# Patient Record
Sex: Female | Born: 1970 | Race: Black or African American | Hispanic: No | Marital: Single | Smoking: Current every day smoker
Health system: Southern US, Community
[De-identification: ages and names within clinical notes are randomized; demographics above are authoritative.]

## PROBLEM LIST (undated history)

## (undated) ENCOUNTER — Emergency Department (HOSPITAL_COMMUNITY): Payer: Self-pay | Source: Home / Self Care

## (undated) ENCOUNTER — Emergency Department (HOSPITAL_COMMUNITY): Payer: Medicaid Other

## (undated) ENCOUNTER — Emergency Department (HOSPITAL_COMMUNITY): Admission: EM | Payer: Medicaid Other | Source: Home / Self Care

## (undated) DIAGNOSIS — I517 Cardiomegaly: Secondary | ICD-10-CM

## (undated) DIAGNOSIS — R002 Palpitations: Secondary | ICD-10-CM

## (undated) DIAGNOSIS — F319 Bipolar disorder, unspecified: Secondary | ICD-10-CM

## (undated) DIAGNOSIS — F1910 Other psychoactive substance abuse, uncomplicated: Secondary | ICD-10-CM

## (undated) DIAGNOSIS — F32A Depression, unspecified: Secondary | ICD-10-CM

## (undated) DIAGNOSIS — D573 Sickle-cell trait: Secondary | ICD-10-CM

## (undated) DIAGNOSIS — F29 Unspecified psychosis not due to a substance or known physiological condition: Secondary | ICD-10-CM

## (undated) DIAGNOSIS — Z5900 Homelessness unspecified: Secondary | ICD-10-CM

## (undated) DIAGNOSIS — F22 Delusional disorders: Secondary | ICD-10-CM

## (undated) DIAGNOSIS — R011 Cardiac murmur, unspecified: Secondary | ICD-10-CM

## (undated) HISTORY — PX: SKIN BIOPSY: SHX1

## (undated) HISTORY — PX: BUNIONECTOMY: SHX129

## (undated) HISTORY — DX: Cardiac murmur, unspecified: R01.1

## (undated) HISTORY — DX: Other psychoactive substance abuse, uncomplicated: F19.10

## (undated) HISTORY — DX: Depression, unspecified: F32.A

---

## 2001-12-30 ENCOUNTER — Encounter: Payer: Self-pay | Admitting: Emergency Medicine

## 2001-12-30 ENCOUNTER — Emergency Department (HOSPITAL_COMMUNITY): Admission: EM | Admit: 2001-12-30 | Discharge: 2001-12-30 | Payer: Self-pay

## 2003-12-12 ENCOUNTER — Emergency Department (HOSPITAL_COMMUNITY): Admission: EM | Admit: 2003-12-12 | Discharge: 2003-12-12 | Payer: Self-pay | Admitting: Emergency Medicine

## 2004-01-08 ENCOUNTER — Emergency Department (HOSPITAL_COMMUNITY): Admission: EM | Admit: 2004-01-08 | Discharge: 2004-01-08 | Payer: Self-pay | Admitting: Emergency Medicine

## 2004-02-17 ENCOUNTER — Encounter: Admission: RE | Admit: 2004-02-17 | Discharge: 2004-02-17 | Payer: Self-pay | Admitting: Family Medicine

## 2004-10-05 ENCOUNTER — Encounter (INDEPENDENT_AMBULATORY_CARE_PROVIDER_SITE_OTHER): Payer: Self-pay | Admitting: *Deleted

## 2004-10-09 ENCOUNTER — Ambulatory Visit: Payer: Self-pay | Admitting: Sports Medicine

## 2004-12-27 ENCOUNTER — Emergency Department (HOSPITAL_COMMUNITY): Admission: EM | Admit: 2004-12-27 | Discharge: 2004-12-27 | Payer: Self-pay | Admitting: Emergency Medicine

## 2005-03-04 ENCOUNTER — Ambulatory Visit: Payer: Self-pay | Admitting: Family Medicine

## 2005-06-10 ENCOUNTER — Emergency Department (HOSPITAL_COMMUNITY): Admission: EM | Admit: 2005-06-10 | Discharge: 2005-06-10 | Payer: Self-pay | Admitting: Emergency Medicine

## 2005-06-18 ENCOUNTER — Ambulatory Visit: Payer: Self-pay | Admitting: Family Medicine

## 2005-06-19 ENCOUNTER — Emergency Department (HOSPITAL_COMMUNITY): Admission: EM | Admit: 2005-06-19 | Discharge: 2005-06-19 | Payer: Self-pay | Admitting: Emergency Medicine

## 2005-11-12 ENCOUNTER — Ambulatory Visit: Payer: Self-pay | Admitting: Sports Medicine

## 2006-04-08 ENCOUNTER — Ambulatory Visit: Payer: Self-pay | Admitting: Family Medicine

## 2006-04-08 ENCOUNTER — Other Ambulatory Visit: Admission: RE | Admit: 2006-04-08 | Discharge: 2006-04-08 | Payer: Self-pay | Admitting: Family Medicine

## 2006-04-21 ENCOUNTER — Ambulatory Visit: Payer: Self-pay | Admitting: Family Medicine

## 2006-05-05 ENCOUNTER — Encounter: Admission: RE | Admit: 2006-05-05 | Discharge: 2006-05-05 | Payer: Self-pay | Admitting: Sports Medicine

## 2006-12-29 DIAGNOSIS — M545 Low back pain, unspecified: Secondary | ICD-10-CM | POA: Insufficient documentation

## 2006-12-29 DIAGNOSIS — F319 Bipolar disorder, unspecified: Secondary | ICD-10-CM | POA: Insufficient documentation

## 2006-12-29 HISTORY — DX: Low back pain, unspecified: M54.50

## 2006-12-30 ENCOUNTER — Encounter (INDEPENDENT_AMBULATORY_CARE_PROVIDER_SITE_OTHER): Payer: Self-pay | Admitting: *Deleted

## 2007-07-15 ENCOUNTER — Emergency Department (HOSPITAL_COMMUNITY): Admission: EM | Admit: 2007-07-15 | Discharge: 2007-07-15 | Payer: Self-pay | Admitting: Emergency Medicine

## 2007-08-16 ENCOUNTER — Encounter (INDEPENDENT_AMBULATORY_CARE_PROVIDER_SITE_OTHER): Payer: Self-pay | Admitting: *Deleted

## 2007-12-07 ENCOUNTER — Emergency Department (HOSPITAL_COMMUNITY): Admission: EM | Admit: 2007-12-07 | Discharge: 2007-12-08 | Payer: Self-pay | Admitting: Emergency Medicine

## 2008-01-08 ENCOUNTER — Emergency Department (HOSPITAL_COMMUNITY): Admission: EM | Admit: 2008-01-08 | Discharge: 2008-01-08 | Payer: Self-pay | Admitting: Emergency Medicine

## 2011-07-23 LAB — URINE MICROSCOPIC-ADD ON

## 2011-07-23 LAB — URINE CULTURE: Colony Count: 85000

## 2011-07-23 LAB — URINALYSIS, ROUTINE W REFLEX MICROSCOPIC
Hgb urine dipstick: NEGATIVE
Specific Gravity, Urine: 1.012
Urobilinogen, UA: 0.2

## 2011-07-23 LAB — POCT PREGNANCY, URINE: Preg Test, Ur: NEGATIVE

## 2011-07-26 LAB — COMPREHENSIVE METABOLIC PANEL
Albumin: 4.5
Alkaline Phosphatase: 48
BUN: 13
Creatinine, Ser: 0.87
Glucose, Bld: 86
Potassium: 3.5
Total Bilirubin: 1.3 — ABNORMAL HIGH
Total Protein: 6.9

## 2011-07-26 LAB — CBC
HCT: 36
Hemoglobin: 12.4
MCV: 86.8
Platelets: 221
RDW: 13.7

## 2011-07-26 LAB — DIFFERENTIAL
Basophils Absolute: 0.1
Basophils Relative: 1
Lymphocytes Relative: 22
Monocytes Absolute: 0.7
Monocytes Relative: 12
Neutro Abs: 3.6
Neutrophils Relative %: 64

## 2011-07-26 LAB — URINALYSIS, ROUTINE W REFLEX MICROSCOPIC
Bilirubin Urine: NEGATIVE
Glucose, UA: NEGATIVE
Hgb urine dipstick: NEGATIVE
Ketones, ur: 40 — AB
Protein, ur: 30 — AB
Urobilinogen, UA: 1

## 2011-07-26 LAB — RAPID URINE DRUG SCREEN, HOSP PERFORMED
Amphetamines: NOT DETECTED
Barbiturates: NOT DETECTED
Benzodiazepines: NOT DETECTED
Cocaine: NOT DETECTED
Opiates: NOT DETECTED

## 2011-07-26 LAB — URINE MICROSCOPIC-ADD ON

## 2011-08-13 LAB — POCT URINALYSIS DIP (DEVICE)
Glucose, UA: NEGATIVE
Operator id: 239701
Protein, ur: NEGATIVE
Urobilinogen, UA: 1

## 2011-08-13 LAB — POCT PREGNANCY, URINE: Operator id: 239701

## 2011-08-13 LAB — GC/CHLAMYDIA PROBE AMP, GENITAL: GC Probe Amp, Genital: NEGATIVE

## 2011-08-13 LAB — OCCULT BLOOD X 1 CARD TO LAB, STOOL: Fecal Occult Bld: NEGATIVE

## 2011-10-07 ENCOUNTER — Emergency Department (HOSPITAL_COMMUNITY)
Admission: EM | Admit: 2011-10-07 | Discharge: 2011-10-07 | Disposition: A | Discharge status: Home / Self Care | Payer: Medicaid Other | Attending: Emergency Medicine | Admitting: Emergency Medicine

## 2011-10-07 ENCOUNTER — Other Ambulatory Visit: Payer: Self-pay

## 2011-10-07 ENCOUNTER — Encounter: Payer: Self-pay | Admitting: Emergency Medicine

## 2011-10-07 ENCOUNTER — Emergency Department (HOSPITAL_COMMUNITY): Payer: Medicaid Other

## 2011-10-07 DIAGNOSIS — I498 Other specified cardiac arrhythmias: Secondary | ICD-10-CM | POA: Insufficient documentation

## 2011-10-07 DIAGNOSIS — I5022 Chronic systolic (congestive) heart failure: Secondary | ICD-10-CM | POA: Insufficient documentation

## 2011-10-07 DIAGNOSIS — R079 Chest pain, unspecified: Secondary | ICD-10-CM | POA: Insufficient documentation

## 2011-10-07 DIAGNOSIS — I509 Heart failure, unspecified: Secondary | ICD-10-CM | POA: Insufficient documentation

## 2011-10-07 HISTORY — DX: Unspecified psychosis not due to a substance or known physiological condition: F29

## 2011-10-07 LAB — BASIC METABOLIC PANEL
BUN: 12 mg/dL (ref 6–23)
CO2: 27 mEq/L (ref 19–32)
Chloride: 102 mEq/L (ref 96–112)
Glucose, Bld: 88 mg/dL (ref 70–99)
Potassium: 4.5 mEq/L (ref 3.5–5.1)
Sodium: 138 mEq/L (ref 135–145)

## 2011-10-07 LAB — CBC
HCT: 38.8 % (ref 36.0–46.0)
Hemoglobin: 13.3 g/dL (ref 12.0–15.0)
MCH: 28.5 pg (ref 26.0–34.0)
MCHC: 34.3 g/dL (ref 30.0–36.0)
MCV: 83.3 fL (ref 78.0–100.0)
Platelets: 238 K/uL (ref 150–400)
RBC: 4.66 MIL/uL (ref 3.87–5.11)
RDW: 13.5 % (ref 11.5–15.5)
WBC: 5.8 K/uL (ref 4.0–10.5)

## 2011-10-07 LAB — BASIC METABOLIC PANEL WITH GFR
Calcium: 9.3 mg/dL (ref 8.4–10.5)
Creatinine, Ser: 0.75 mg/dL (ref 0.50–1.10)
GFR calc Af Amer: >90 mL/min (ref >90)
GFR calc non Af Amer: >90 mL/min (ref >90)

## 2011-10-07 LAB — POCT I-STAT TROPONIN I: Troponin i, poc: 0 ng/mL (ref 0.00–0.08)

## 2011-10-07 NOTE — ED Notes (Signed)
Pt c/o left sided CP with SOB today; pt sts bilateral LE edema; pt sts hx of same with CHF but not followed up with PCP; pt denies N/V or diaphoresis

## 2011-10-07 NOTE — ED Notes (Signed)
Pt is extremely anxious and crying about her condition.  Asked if this pain in her chest increases with anxiety.  Pt states it doesn't increase with anxiety, but decreases when she smokes because she is focused on smoking not on the pain.  The shortness of breath also decreases for patient when she smokes patient states.

## 2011-10-07 NOTE — ED Notes (Signed)
Patient intermittent chest pain for a while seen in another ED one year ago and chest pain comes and goes for approx one hour. States smoking helps with the pain.  Pain currently 0/10 when pain appears location under left breast.  Airway intact bilateral equal chest rise and fall,

## 2011-10-07 NOTE — ED Provider Notes (Signed)
History     CSN: [id number] Arrival date & time: 10/07/2011  2:32 PM   First MD Initiated Contact with Patient 10/07/11 1550      Chief Complaint  Patient presents with  . Chest Pain    (Consider location/radiation/quality/duration/timing/severity/associated sxs/prior treatment) HPI Comments: Patient reports that she's had focal left-sided chest pain or that is nondescriptive and not present currently that has been bothering her for several months. She reports she had about 5 or 6 visits in a short time frame in August of this year while she was at [facility]. She reports that she was told she has congestive heart failure. She denies a history of hypertension, coronary artery disease. She does smoke cigarettes. She is no family history of early coronary disease. She reports that she was on the bus nearby when she had another episode of the chest pain and decided to come here for evaluation. She denies any recent fever, cold symptoms, cough. She denies pleurisy. She denies radiation of the pain. She reports she often has pain like this that lasts about an hour and she'll have it several times per day lasting for a couple of days. She'll then have no symptoms for a prolonged period of time until it starts up again. She denies any trauma. She reports that she decided she would try to find a primary care physician here locally but has not yet done so. She used to see family practice clinic here in [place] prior to transiently moving to [place] for the past year. She came back to [place] only recently. She denies any current stressors.  Patient is a 40 y.o. female presenting with chest pain. The history is provided by the patient.  Chest Pain Pertinent negatives for primary symptoms include no fever, no shortness of breath, no cough and no abdominal pain.     Past Medical History  Diagnosis Date  . Congestive heart failure     History reviewed. No pertinent past surgical  history.  History reviewed. No pertinent family history.  History  Substance Use Topics  . Smoking status: Current Everyday Smoker  . Smokeless tobacco: Not on file  . Alcohol Use: No    OB History    Grav Para Term Preterm Abortions TAB SAB Ect Mult Living                  Review of Systems  Constitutional: Negative for fever and chills.  HENT: Negative for congestion and rhinorrhea.   Respiratory: Negative for cough and shortness of breath.   Cardiovascular: Positive for chest pain.  Gastrointestinal: Negative for abdominal pain.  Musculoskeletal: Negative for back pain.  All other systems reviewed and are negative.    Allergies  Review of patient's allergies indicates no known allergies.  Home Medications   Current Outpatient Rx  Name Route Sig Dispense Refill  . IBUPROFEN 600 MG PO TABS Oral Take 600 mg by mouth every 6 (six) hours as needed. For pain       BP 114/76  Pulse 73  Temp(Src) 98.2 F (36.8 C) (Oral)  Resp 16  SpO2 100%  Physical Exam  Nursing note and vitals reviewed. Constitutional: She appears well-developed and well-nourished. No distress.  Cardiovascular: S1 normal, S2 normal and intact distal pulses.   No extrasystoles are present. Bradycardia present.   No murmur heard. Pulmonary/Chest: Effort normal and breath sounds normal. No respiratory distress. She has no wheezes.  Abdominal: Soft.  Psychiatric: Her affect is blunt. She is  not agitated, not slowed and not withdrawn.       Pt's affect is somewhat unusual, seems somewhat argumentative.  Possibly slightly pre-occupied, but makes good eye contact and doesn't seem to be watching or listening to overt hallucinations.   She is attentive.    ED Course  Procedures (including critical care time)   Labs Reviewed  BASIC METABOLIC PANEL  CBC  POCT I-STAT TROPONIN I  I-STAT TROPONIN I   Dg Chest 2 View  10/07/2011  *RADIOLOGY REPORT*  Clinical Data: Chest pain and shortness of breath.   CHEST - 2 VIEW  Comparison: PA and lateral chest 12/27/2004.  Findings: Lungs are clear.  No pneumothorax or pleural effusion. Heart size normal.  No focal bony abnormality.  IMPRESSION: Negative exam.  Original Report Authenticated By: Bernadene Bell. D'ALESSIO, M.D.     1. Chest pain      I reviewed the above chest x-ray myself. MDM    I reviewed spotty PCP and ED notes over the past 6 years.  Pt is thin, in no distress, not sweaty, no active CP.  ECG at time 14:34 shows NSR at 74, normal axis, no ST or T wave changes.  Poor r wave progression V1 through V4 and no prior ECG's to compare to.  Lungs are clear, no JVD.  Given lack of h/o CAD or HTN, I doubt that she has CHF.  Her pain is atypical and will rule out any recent cardiac injury with 1 troponin and otherwise I agree with pt in that she can follow up with a PCP and I have verbally suggested some clinics around town.  I doubt PE as she has no edema on exam, not pleuritic and sats on RA are 100% which is completely normal.        5:33 PM Although I am unable to see recent EPIC or eChart admissions, I reviewed a discharge summary from 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  This is in line with slightly abn affect and argumentative behavior here.  I do not feel pt is a threat to self at this point, but have recommended that pt seek outpatient help as needed.    Gavin Pound. Debra Colon, MD 10/07/11 9604

## 2011-10-07 NOTE — Discharge Instructions (Signed)
 Your ECG, blood tests and chest xray were all ok.  No evidence of heart damage or congestive heart failure.  I recommend that you obtain a family physician as you were planning for ongoing continued care and return if you have any other concerns or any new symptoms.      Chest Pain (Nonspecific) It is often hard to give a specific diagnosis for the cause of chest pain. There is always a chance that your pain could be related to something serious, such as a heart attack or a blood clot in the lungs. You need to follow up with your caregiver for further evaluation. CAUSES   Heartburn.   Pneumonia or bronchitis.   Anxiety and stress.   Inflammation around your heart (pericarditis) or lung (pleuritis or pleurisy).   A blood clot in the lung.   A collapsed lung (pneumothorax). It can develop suddenly on its own (spontaneous pneumothorax) or from injury (trauma) to the chest.  The chest wall is composed of bones, muscles, and cartilage. Any of these can be the source of the pain.  The bones can be bruised by injury.   The muscles or cartilage can be strained by coughing or overwork.   The cartilage can be affected by inflammation and become sore (costochondritis).  DIAGNOSIS  Lab tests or other studies, such as X-rays, an EKG, stress testing, or cardiac imaging, may be needed to find the cause of your pain.  TREATMENT   Treatment depends on what may be causing your chest pain. Treatment may include:   Acid blockers for heartburn.   Anti-inflammatory medicine.   Pain medicine for inflammatory conditions.   Antibiotics if an infection is present.   You may be advised to change lifestyle habits. This includes stopping smoking and avoiding caffeine and chocolate.   You may be advised to keep your head raised (elevated) when sleeping. This reduces the chance of acid going backward from your stomach into your esophagus.   Most of the time, nonspecific chest pain will improve within 2  to 3 days with rest and mild pain medicine.  HOME CARE INSTRUCTIONS   If antibiotics were prescribed, take the full amount even if you start to feel better.   For the next few days, avoid physical activities that bring on chest pain. Continue physical activities as directed.   Do not smoke cigarettes or drink alcohol until your symptoms are gone.   Only take over-the-counter or prescription medicine for pain, discomfort, or fever as directed by your caregiver.   Follow your caregiver's suggestions for further testing if your chest pain does not go away.   Keep any follow-up appointments you made. If you do not go to an appointment, you could develop lasting (chronic) problems with pain. If there is any problem keeping an appointment, you must call to reschedule.  SEEK MEDICAL CARE IF:   You think you are having problems from the medicine you are taking. Read your medicine instructions carefully.   Your chest pain does not go away, even after treatment.   You develop a rash with blisters on your chest.  SEEK IMMEDIATE MEDICAL CARE IF:   You have increased chest pain or pain that spreads to your arm, neck, jaw, back, or belly (abdomen).   You develop shortness of breath, an increasing cough, or you are coughing up blood.   You have severe back or abdominal pain, feel sick to your stomach (nauseous) or throw up (vomit).   You develop severe  weakness, fainting, or chills.   You have an oral temperature above 102 F (38.9 C), not controlled by medicine.  THIS IS AN EMERGENCY. Do not wait to see if the pain will go away. Get medical help at once. Call your local emergency services (911 in [place] Do not drive yourself to the hospital. MAKE SURE YOU:   Understand these instructions.   Will watch your condition.   Will get help right away if you are not doing well or get worse.  Document Released: 07/28/2005 Document Revised: 06/30/2011 Document Reviewed: 05/23/2008 [facility] Patient  Information 2012 [place], [place].

## 2012-04-06 LAB — PULMONARY FUNCTION TEST

## 2012-06-01 ENCOUNTER — Emergency Department (HOSPITAL_COMMUNITY): Payer: Medicaid Other

## 2012-06-01 ENCOUNTER — Encounter (HOSPITAL_COMMUNITY): Payer: Self-pay | Admitting: Emergency Medicine

## 2012-06-01 ENCOUNTER — Emergency Department (HOSPITAL_COMMUNITY)
Admission: EM | Admit: 2012-06-01 | Discharge: 2012-06-01 | Disposition: A | Discharge status: Home / Self Care | Payer: Medicaid Other | Source: Home / Self Care | Attending: Emergency Medicine | Admitting: Emergency Medicine

## 2012-06-01 ENCOUNTER — Emergency Department (HOSPITAL_COMMUNITY)
Admission: EM | Admit: 2012-06-01 | Discharge: 2012-06-01 | Disposition: A | Discharge status: Home / Self Care | Payer: Medicaid Other | Attending: Emergency Medicine | Admitting: Emergency Medicine

## 2012-06-01 ENCOUNTER — Encounter (HOSPITAL_COMMUNITY): Payer: Self-pay | Admitting: *Deleted

## 2012-06-01 DIAGNOSIS — R0602 Shortness of breath: Secondary | ICD-10-CM | POA: Insufficient documentation

## 2012-06-01 DIAGNOSIS — R002 Palpitations: Secondary | ICD-10-CM | POA: Insufficient documentation

## 2012-06-01 DIAGNOSIS — F17200 Nicotine dependence, unspecified, uncomplicated: Secondary | ICD-10-CM | POA: Insufficient documentation

## 2012-06-01 HISTORY — DX: Palpitations: R00.2

## 2012-06-01 LAB — COMPREHENSIVE METABOLIC PANEL
ALT: 11 U/L (ref 0–35)
AST: 18 U/L (ref 0–37)
CO2: 25 mEq/L (ref 19–32)
Calcium: 9.5 mg/dL (ref 8.4–10.5)
Chloride: 100 mEq/L (ref 96–112)
GFR calc non Af Amer: >90 mL/min (ref >90)
Sodium: 138 mEq/L (ref 135–145)

## 2012-06-01 LAB — CBC
MCH: 28.2 pg (ref 26.0–34.0)
Platelets: 222 10*3/uL (ref 150–400)
RBC: 4.54 MIL/uL (ref 3.87–5.11)
WBC: 4.3 10*3/uL (ref 4.0–10.5)

## 2012-06-01 LAB — POCT I-STAT TROPONIN I: Troponin i, poc: 0 ng/mL (ref 0.00–0.08)

## 2012-06-01 MED ORDER — POTASSIUM CHLORIDE CRYS ER 20 MEQ PO TBCR
40.0000 meq | EXTENDED_RELEASE_TABLET | Freq: Once | ORAL | Status: AC
  Administered 2012-06-01: 40 meq via ORAL
  Filled 2012-06-01: qty 2

## 2012-06-01 NOTE — ED Provider Notes (Signed)
History     CSN: [id number]  Arrival date & time 06/01/12  1030   First MD Initiated Contact with Patient 06/01/12 1104      Chief Complaint  Patient presents with  . Palpitations  . Shortness of Breath    (Consider location/radiation/quality/duration/timing/severity/associated sxs/prior treatment) HPI Comments: Patient seen here for same last night.  Reports ongoing palpitations and shortness of breath.  This has been going on for 2 years and has been worked up by cardiology at [facility] and no cause has been found.  Workup last night was unremarkable with the exception of mildly low k.    Patient is a 41 y.o. female presenting with palpitations. The history is provided by the patient.  Palpitations  This is a recurrent problem. Episode onset: 2 years ago. Episode frequency: intermittently. The problem has not changed since onset.The problem is associated with an unknown factor. Pertinent negatives include no diaphoresis. She has tried nothing for the symptoms.    Past Medical History  Diagnosis Date  . Palpitations   . Psychosis     Past Surgical History  Procedure Date  . Knee surgery     No family history on file.  History  Substance Use Topics  . Smoking status: Current Everyday Smoker -- 5 years    Types: Cigarettes  . Smokeless tobacco: Not on file  . Alcohol Use: No    OB History    Grav Para Term Preterm Abortions TAB SAB Ect Mult Living                  Review of Systems  Constitutional: Negative for diaphoresis.  Cardiovascular: Positive for palpitations.  All other systems reviewed and are negative.    Allergies  Strawberry and Risperidone and related  Home Medications   Current Outpatient Rx  Name Route Sig Dispense Refill  . AMOXICILLIN 500 MG PO CAPS Oral Take 2,000 mg by mouth once. Dental premed      BP 107/77  Pulse 81  Temp 98.6 F (37 C) (Oral)  Resp 18  SpO2 98%  LMP 05/01/2012  Physical Exam  Nursing note and vitals  reviewed. Constitutional: She is oriented to person, place, and time. She appears well-developed and well-nourished. No distress.  HENT:  Head: Normocephalic and atraumatic.  Neck: Normal range of motion. Neck supple.  Cardiovascular: Normal rate and regular rhythm.  Exam reveals no gallop and no friction rub.   No murmur heard. Pulmonary/Chest: Effort normal and breath sounds normal. No respiratory distress. She has no wheezes.  Abdominal: Soft. Bowel sounds are normal. She exhibits no distension. There is no tenderness.  Musculoskeletal: Normal range of motion.  Neurological: She is alert and oriented to person, place, and time.  Skin: Skin is warm and dry. She is not diaphoretic.    ED Course  Procedures (including critical care time)  Labs Reviewed - No data to display Dg Chest 2 View  06/01/2012  *RADIOLOGY REPORT*  Clinical Data: Shortness of breath  CHEST - 2 VIEW  Comparison: 10/07/2011  Findings: Lungs are clear. No pleural effusion or pneumothorax. Convex left atrial appendage without overt cardiomegaly.  The cardiomediastinal contours are otherwise within normal limits. The visualized bones and soft tissues are without significant appreciable abnormality.  IMPRESSION: Convex left atrial appendage may be within upper normal limits or mildly enlarged.  This finding can be seen with mitral valve dysfunction. If clinically warranted, consider echocardiogram follow-up.  Clear lungs.  Original Report Authenticated By: Greig Castilla  [facility], M.D.     No diagnosis found.   Date: 06/01/2012  Rate: 57  Rhythm: normal sinus rhythm  QRS Axis: normal  Intervals: normal  ST/T Wave abnormalities: normal  Conduction Disutrbances:none  Narrative Interpretation:   Old EKG Reviewed: unchanged    MDM  The patient was here last night for the same symptoms that have been ongoing for the past 2 years.  She has been worked up by cardiology for this but no cause has been found.  She appears well,  is in a normal sinus rhythm, and labs 12 hours ago were completely normal with the exception of the potassium of 3.1.  I agree with Dr. Jeraldine Loots that no further workup is indicated and she is stable for discharge.  There does not appear to be any emergent process.  She will be given the number for [facility] to arrange follow up and possible Holter monitoring.        Geoffery Lyons, MD 06/01/12 1144

## 2012-06-01 NOTE — ED Notes (Signed)
Pt discharged home. Had no further questions at the time. Encouraged to follow up with Cardiologist.

## 2012-06-01 NOTE — ED Notes (Signed)
Pt presents to department for evaluation of palpitations and SOB. Ongoing since yesterday morning. Pt states previous history of same. States "I can feel my heart jumping around." denies actual pain. Skin warm and dry. Lung sounds clear and equal bilaterally. She is conscious alert and oriented x4.

## 2012-06-01 NOTE — ED Provider Notes (Signed)
History     CSN: [id number]  Arrival date & time 06/01/12  0130   First MD Initiated Contact with Patient 06/01/12 [phone]      Chief Complaint  Patient presents with  . Shortness of Breath    (Consider location/radiation/quality/duration/timing/severity/associated sxs/prior treatment) HPI This patient with history of palpitations presents with concerns of ongoing palpitations and intermittent chest tightness.  She denies nausea, but does state that she is occasionally dizzy.  She notes that she has mild intermittent lower extremity edema, unchanged.  She notes that over the last year she has been evaluated by her physician with multiple exams, and that no identifiable source of her palpitations has been identified. There are no clear exacerbating or alleviating factors.  She denies any ongoing headache, confusion, disorientation, significant dyspnea. Past Medical History  Diagnosis Date  . Palpitations   . Psychosis     History reviewed. No pertinent past surgical history.  No family history on file.  History  Substance Use Topics  . Smoking status: Current Everyday Smoker -- 5 years    Types: Cigarettes  . Smokeless tobacco: Not on file  . Alcohol Use: No    OB History    Grav Para Term Preterm Abortions TAB SAB Ect Mult Living                  Review of Systems  Constitutional:       HPI  HENT:       HPI otherwise negative  Eyes: Negative.   Respiratory:       HPI, otherwise negative  Cardiovascular:       HPI, otherwise nmegative  Gastrointestinal: Negative for vomiting.  Genitourinary:       HPI, otherwise negative  Musculoskeletal:       HPI, otherwise negative  Skin: Negative.   Neurological: Negative for syncope.    Allergies  Strawberry and Risperidone and related  Home Medications   Current Outpatient Rx  Name Route Sig Dispense Refill  . AMOXICILLIN 500 MG PO CAPS Oral Take 2,000 mg by mouth once. Dental premed    . IBUPROFEN 600 MG PO TABS  Oral Take 600 mg by mouth every 6 (six) hours as needed. For pain       BP 100/70  Pulse 50  Temp 97.7 F (36.5 C) (Oral)  Resp 18  Ht 5\' 5"  (1.651 m)  Wt 130 lb (58.968 kg)  BMI 21.63 kg/m2  SpO2 100%  Physical Exam  Nursing note and vitals reviewed. Constitutional: She is oriented to person, place, and time. She appears well-developed and well-nourished. No distress.  HENT:  Head: Normocephalic and atraumatic.  Eyes: Conjunctivae and EOM are normal.  Cardiovascular: Normal rate and regular rhythm.   Pulmonary/Chest: Effort normal and breath sounds normal. No stridor. No respiratory distress.  Abdominal: She exhibits no distension.  Musculoskeletal: She exhibits no edema.  Neurological: She is alert and oriented to person, place, and time. No cranial nerve deficit.  Skin: Skin is warm and dry.  Psychiatric: She has a normal mood and affect.    ED Course  Procedures (including critical care time)  Labs Reviewed  COMPREHENSIVE METABOLIC PANEL - Abnormal; Notable for the following:    Potassium 3.2 (*)     All other components within normal limits  CBC  PRO B NATRIURETIC PEPTIDE  POCT PREGNANCY, URINE  POCT I-STAT TROPONIN I   Dg Chest 2 View  06/01/2012  *RADIOLOGY REPORT*  Clinical Data: Shortness of breath  CHEST - 2 VIEW  Comparison: 10/07/2011  Findings: Lungs are clear. No pleural effusion or pneumothorax. Convex left atrial appendage without overt cardiomegaly.  The cardiomediastinal contours are otherwise within normal limits. The visualized bones and soft tissues are without significant appreciable abnormality.  IMPRESSION: Convex left atrial appendage may be within upper normal limits or mildly enlarged.  This finding can be seen with mitral valve dysfunction. If clinically warranted, consider echocardiogram follow-up.  Clear lungs.  Original Report Authenticated By: Waneta Martins, M.D.     1. Palpitations      Date: 06/01/2012  Rate: 86   Rhythm: normal  sinus rhythm and sinus arrhythmia  QRS Axis: normal  Intervals: normal  ST/T Wave abnormalities: normal  Conduction Disutrbances:none  Narrative Interpretation:   Old EKG Reviewed: none available BORDERLINE      MDM  This generally well-appearing female presents with ongoing palpitations, intermittent chest tightness.  The patient has no significant risk factors for PE, nor for CV pathology.  On exam the patient is in no distress, not hypoxic, tachypneic or tachycardic.  The patient's ECG is nonischemic.  The patient has close followup potential and was discharged in stable condition to follow up with her primary care physician.    Gerhard Munch, MD 06/01/12 [phone]

## 2012-06-01 NOTE — ED Notes (Addendum)
Pt reports feeling palpitations and shortness of breath onset last night. patient reports history of same but forgot to follow up with PMD. Pt reports recent stress in her life. Pt reports having abnormal period in July, lasting only 3 days.

## 2012-06-01 NOTE — ED Notes (Addendum)
Pt states she has a hx of palpitations with borderline LVH.  She yesterday around 1600 she began feeling chest tightness and increased palpitations.  Pt denies nausea, but she states she feels tired and dizzy. She also c/o bil LE edema yesterday.

## 2012-06-12 ENCOUNTER — Encounter: Payer: Self-pay | Admitting: *Deleted

## 2012-06-12 ENCOUNTER — Emergency Department (HOSPITAL_COMMUNITY)
Admission: EM | Admit: 2012-06-12 | Discharge: 2012-06-12 | Discharge status: Against Medical Advice | Payer: Medicaid Other | Attending: Emergency Medicine | Admitting: Emergency Medicine

## 2012-06-12 ENCOUNTER — Other Ambulatory Visit: Payer: Self-pay

## 2012-06-12 ENCOUNTER — Encounter: Payer: Self-pay | Admitting: Cardiovascular Disease

## 2012-06-12 ENCOUNTER — Emergency Department (HOSPITAL_COMMUNITY): Payer: Medicaid Other

## 2012-06-12 DIAGNOSIS — F29 Unspecified psychosis not due to a substance or known physiological condition: Secondary | ICD-10-CM | POA: Insufficient documentation

## 2012-06-12 DIAGNOSIS — R002 Palpitations: Secondary | ICD-10-CM | POA: Insufficient documentation

## 2012-06-12 DIAGNOSIS — R0602 Shortness of breath: Secondary | ICD-10-CM | POA: Insufficient documentation

## 2012-06-12 DIAGNOSIS — R0789 Other chest pain: Secondary | ICD-10-CM | POA: Insufficient documentation

## 2012-06-12 HISTORY — DX: Bipolar disorder, unspecified: F31.9

## 2012-06-12 NOTE — ED Notes (Signed)
Pt being uncooperative at triage; pt not answering all questions and refused to have EMT/NTech at bedside;

## 2012-06-12 NOTE — ED Notes (Signed)
Pt refusing to have blood work done--Jennaya at bedside speaking with pt re: importance of bloodwork

## 2012-06-12 NOTE — ED Notes (Addendum)
Reports SOB and chest pressure starting about 5pm; pt is diaphoretic at triage, but reports she had to walk here; has seen PCP re: cp and has cardiology appt tomorrow;

## 2012-06-12 NOTE — ED Notes (Signed)
Pt refused lab draws, and chest x ray. She states she is going to leave because "I know know one is going to do anything for me. They keep telling me nothing is wrong but I know something is wrong and they just won't make a diagnosis" RN explained that pt should be seen by a Dr and that we are here to help her. Pt walked out.

## 2012-06-13 ENCOUNTER — Encounter: Payer: Medicaid Other | Admitting: Cardiovascular Disease

## 2012-07-06 ENCOUNTER — Ambulatory Visit (INDEPENDENT_AMBULATORY_CARE_PROVIDER_SITE_OTHER): Payer: Medicaid Other | Admitting: Cardiology

## 2012-07-06 ENCOUNTER — Encounter: Payer: Self-pay | Admitting: Cardiology

## 2012-07-06 VITALS — BP 107/70 | HR 69 | Ht 65.0 in | Wt 129.0 lb

## 2012-07-06 DIAGNOSIS — R002 Palpitations: Secondary | ICD-10-CM

## 2012-07-06 DIAGNOSIS — F17200 Nicotine dependence, unspecified, uncomplicated: Secondary | ICD-10-CM

## 2012-07-06 DIAGNOSIS — Z720 Tobacco use: Secondary | ICD-10-CM | POA: Insufficient documentation

## 2012-07-06 NOTE — Assessment & Plan Note (Signed)
Etiology unclear. She had an extensive workup in [facility] recently including stress test, echo and monitor. She states she did have palpitations with a monitor in place. We will obtain all records from [facility] to review and make further recommendations based on results.

## 2012-07-06 NOTE — Progress Notes (Signed)
  HPI: 41 year-old female with no prior cardiac history for evaluation of palpitations. Chest x-ray in August 2013 showed clear lungs. Left atrial appendage possibly dilated. Troponin normal on 06/01/2012. Hemoglobin 12.8. Potassium 3.2. Renal function normal. Liver functions normal. Pro BNP 42. Patient apparently has had intermittent palpitations for several years. She was evaluated in [place] at an emergency room there by her report. She also has recently been seen by a cardiologist in [facility]. She apparently had an echocardiogram, stress test and monitor but I do not have those results available. She has had an occasional skipped in the past. More recently she has had a brief flutter. She does not have significant dyspnea on exertion, orthopnea or PND but she occasionally has mild pedal edema. No chest pain or syncope. Her palpitations were more severe on August 1 prompting her evaluation in the emergency room. We are now asked to evaluate.  Current Outpatient Prescriptions  Medication Sig Dispense Refill  . albuterol (PROAIR HFA) 108 (90 BASE) MCG/ACT inhaler Inhale 2 puffs into the lungs every 6 (six) hours as needed.      . zolpidem (AMBIEN) 10 MG tablet Take 10 mg by mouth at bedtime as needed.        Allergies  Allergen Reactions  . Strawberry Anaphylaxis  . Risperidone And Related Other (See Comments)    unknown    Past Medical History  Diagnosis Date  . Palpitations   . Psychosis   . BIPOLAR DISORDER   . Substance abuse     Past Surgical History  Procedure Date  . Bunionectomy     History   Social History  . Marital Status: Single    Spouse Name: N/A    Number of Children: 2  . Years of Education: N/A   Occupational History  .      Unemployed   Social History Main Topics  . Smoking status: Current Everyday Smoker -- 5 years    Types: Cigarettes  . Smokeless tobacco: Not on file  . Alcohol Use: No  . Drug Use: No     Previous cocaine, heroin, marijuana    . Sexually Active: Not on file   Other Topics Concern  . Not on file   Social History Narrative  . No narrative on file    Family History  Problem Relation Age of Onset  . Heart disease Mother     CHF    ROS: back pain but no fevers or chills, productive cough, hemoptysis, dysphasia, odynophagia, melena, hematochezia, dysuria, hematuria, rash, seizure activity, orthopnea, PND, pedal edema, claudication. Remaining systems are negative.  Physical Exam:  Blood pressure 107/70, pulse 69, height 5\' 5"  (1.651 m), weight 129 lb (58.514 kg).  General:  Well developed/well nourished in NAD Skin warm/dry Patient not depressed No peripheral clubbing Back-normal HEENT-normal/normal eyelids Neck supple/normal carotid upstroke bilaterally; no bruits; no JVD; no thyromegaly chest - CTA/ normal expansion CV - RRR/normal S1 and S2; no murmurs, rubs or gallops;  PMI nondisplaced Abdomen -NT/ND, no HSM, no mass, + bowel sounds, no bruit 2+ femoral pulses, no bruits Ext-no edema, chords, 2+ DP Neuro-grossly nonfocal  ECG 06/12/2012-sinus rhythm with no ST changes.

## 2012-07-06 NOTE — Assessment & Plan Note (Signed)
Patient counseled on discontinuing. 

## 2012-07-06 NOTE — Patient Instructions (Addendum)
Your physician recommends that you schedule a follow-up appointment in: 3 MONTHS WITH DR CRENSHAW  

## 2012-08-06 ENCOUNTER — Encounter (HOSPITAL_COMMUNITY): Payer: Self-pay | Admitting: *Deleted

## 2012-08-06 ENCOUNTER — Emergency Department (HOSPITAL_COMMUNITY): Payer: Medicaid Other

## 2012-08-06 ENCOUNTER — Emergency Department (HOSPITAL_COMMUNITY)
Admission: EM | Admit: 2012-08-06 | Discharge: 2012-08-06 | Disposition: A | Discharge status: Home / Self Care | Payer: Medicaid Other | Attending: Emergency Medicine | Admitting: Emergency Medicine

## 2012-08-06 DIAGNOSIS — R0602 Shortness of breath: Secondary | ICD-10-CM | POA: Insufficient documentation

## 2012-08-06 DIAGNOSIS — G890 Central pain syndrome: Secondary | ICD-10-CM | POA: Insufficient documentation

## 2012-08-06 DIAGNOSIS — R002 Palpitations: Secondary | ICD-10-CM | POA: Insufficient documentation

## 2012-08-06 LAB — TROPONIN I
Troponin I: <0.3 ng/mL (ref <0.30)
Troponin I: <0.3 ng/mL (ref <0.30)

## 2012-08-06 LAB — POCT PREGNANCY, URINE: Preg Test, Ur: NEGATIVE

## 2012-08-06 LAB — POCT I-STAT, CHEM 8
BUN: 13 mg/dL (ref 6–23)
Calcium, Ion: 1.19 mmol/L (ref 1.12–1.23)
Chloride: 103 mEq/L (ref 96–112)
Creatinine, Ser: 0.9 mg/dL (ref 0.50–1.10)
Glucose, Bld: 99 mg/dL (ref 70–99)
HCT: 38 % (ref 36.0–46.0)
Hemoglobin: 12.9 g/dL (ref 12.0–15.0)
Potassium: 3.4 mEq/L — ABNORMAL LOW (ref 3.5–5.1)
Sodium: 141 mEq/L (ref 135–145)
TCO2: 24 mmol/L (ref 0–100)

## 2012-08-06 LAB — CBC WITH DIFFERENTIAL/PLATELET
Lymphocytes Relative: 44 % (ref 12–46)
Lymphs Abs: 3 10*3/uL (ref 0.7–4.0)
MCV: 82 fL (ref 78.0–100.0)
Neutro Abs: 3.3 10*3/uL (ref 1.7–7.7)
Neutrophils Relative %: 47 % (ref 43–77)
Platelets: 261 10*3/uL (ref 150–400)
RBC: 4.28 MIL/uL (ref 3.87–5.11)
WBC: 7 10*3/uL (ref 4.0–10.5)

## 2012-08-06 LAB — D-DIMER, QUANTITATIVE: D-Dimer, Quant: 0.58 ug/mL-FEU — ABNORMAL HIGH (ref 0.00–0.48)

## 2012-08-06 LAB — GLUCOSE, CAPILLARY: Glucose-Capillary: 89 mg/dL (ref 70–99)

## 2012-08-06 MED ORDER — IOHEXOL 350 MG/ML SOLN: 100.0000 mL | Freq: Once | INTRAVENOUS | Status: DC | PRN

## 2012-08-06 NOTE — ED Notes (Addendum)
Pt. A.O. X 4. NAD. Vitals stable.Denies pain. Ambulatory. No further questions at this time.   Pt removed IV. Bleeding controled. Cath intact on inspection. NAD.

## 2012-08-06 NOTE — ED Notes (Signed)
Pt A.O. X 3. Mutters under her breath. Words undistinguishable. When confronted, pt denies speaking to anyone and becomes agitated. Vitals stable. NAD.

## 2012-08-06 NOTE — ED Notes (Signed)
Pt was at the hair salon and began feeling head pain.  Then the pain radiated down her L arm and L leg.  After that she began having palpitations.  Pt denies hx of anxiety and states she is having a cardiac work-up at [facility].  Dr Jens Som is seeing her at [facility].

## 2012-08-06 NOTE — ED Notes (Addendum)
Pt states "I am having trouble remembering to breath". Lung sounds clear bilaterally. Respirations even and regular. Vitals stable. NAD. Denies any pain. Skin warm and dry. A.O. X 4.

## 2012-08-06 NOTE — ED Provider Notes (Signed)
History     CSN: [id number]  Arrival date & time 08/06/12  0001   First MD Initiated Contact with Patient 08/06/12 0221      Chief Complaint  Patient presents with  . Leg Pain  . Palpitations    (Consider location/radiation/quality/duration/timing/severity/associated sxs/prior treatment) Patient is a 41 y.o. female presenting with palpitations. The history is provided by the patient.  Palpitations  This is a new problem. The current episode started 6 to 12 hours ago. The problem occurs constantly. The problem has not changed since onset.Associated with: getting her hair done and had pain that shot from left neck to left ant through left body and into left leg. Pertinent negatives include no diaphoresis, no fever, no malaise/fatigue, no numbness, no chest pressure, no near-syncope, no orthopnea, no PND, no headaches, no dizziness, no weakness, no cough and no hemoptysis. Associated symptoms comments: Felt like she forgot to breath.    Past Medical History  Diagnosis Date  . Palpitations   . Psychosis   . BIPOLAR DISORDER   . Substance abuse     Past Surgical History  Procedure Date  . Bunionectomy     Family History  Problem Relation Age of Onset  . Heart disease Mother     CHF    History  Substance Use Topics  . Smoking status: Current Every Day Smoker -- 5 years    Types: Cigarettes  . Smokeless tobacco: Not on file  . Alcohol Use: No    OB History    Grav Para Term Preterm Abortions TAB SAB Ect Mult Living                  Review of Systems  Constitutional: Negative for fever, malaise/fatigue and diaphoresis.  Respiratory: Negative for cough and hemoptysis.   Cardiovascular: Positive for palpitations. Negative for orthopnea, leg swelling, PND and near-syncope.  Gastrointestinal: Negative for abdominal distention.  Neurological: Negative for dizziness, seizures, facial asymmetry, speech difficulty, weakness, light-headedness, numbness and headaches.  All  other systems reviewed and are negative.    Allergies  Strawberry and Risperidone and related  Home Medications   Current Outpatient Rx  Name Route Sig Dispense Refill  . ALBUTEROL SULFATE HFA 108 (90 BASE) MCG/ACT IN AERS Inhalation Inhale 2 puffs into the lungs every 6 (six) hours as needed.      BP 107/65  Pulse 68  Temp 97.8 F (36.6 C) (Oral)  Resp 16  Ht 5\' 5"  (1.651 m)  Wt 129 lb (58.514 kg)  BMI 21.47 kg/m2  SpO2 99%  LMP 08/02/2012  Physical Exam  Constitutional: She is oriented to person, place, and time. She appears well-developed. No distress.  HENT:  Head: Normocephalic and atraumatic.  Mouth/Throat: Oropharynx is clear and moist.  Eyes: Conjunctivae normal are normal. Pupils are equal, round, and reactive to light.  Neck: Normal range of motion. Neck supple.  Cardiovascular: Normal rate and regular rhythm.   Pulmonary/Chest: Effort normal and breath sounds normal. She has no wheezes. She has no rales.  Abdominal: Soft. Bowel sounds are normal. There is no tenderness. There is no rebound and no guarding.  Musculoskeletal: Normal range of motion. She exhibits no edema and no tenderness.  Neurological: She is alert and oriented to person, place, and time. She has normal reflexes. She displays normal reflexes. No cranial nerve deficit. She exhibits normal muscle tone.  Skin: Skin is warm and dry. She is not diaphoretic.  Psychiatric: She has a normal mood and affect.  ED Course  Procedures (including critical care time)  Labs Reviewed  CBC WITH DIFFERENTIAL - Abnormal; Notable for the following:    HCT 35.1 (*)     All other components within normal limits  D-DIMER, QUANTITATIVE - Abnormal; Notable for the following:    D-Dimer, Quant 0.58 (*)     All other components within normal limits  TROPONIN I  TROPONIN I  POCT PREGNANCY, URINE  GLUCOSE, CAPILLARY  POCT I-STAT, CHEM 8   Dg Chest 2 View  08/06/2012  *RADIOLOGY REPORT*  Clinical Data: Chest  pressure, shortness of breath.  CHEST - 2 VIEW  Comparison: 06/01/2012  Findings: Lungs clear.  Heart size and pulmonary vascularity normal.  No effusion.  Visualized bones unremarkable.  IMPRESSION: No acute disease   Original Report Authenticated By: Osa Craver, M.D.      No diagnosis found.    MDM   Date: 08/06/2012  Rate:71  Rhythm: normal sinus rhythm  QRS Axis: normal  Intervals: normal  ST/T Wave abnormalities: normal  Conduction Disutrbances: none  Narrative Interpretation: unremarkable    Greater than 8 hours of pain, continuous pain with one negative ekg and one negative troponin is sufficient to exclude ACS. Follow up with your PMD for ongoing care.          Jasmine Awe, MD 08/06/12 [phone]

## 2012-10-13 ENCOUNTER — Ambulatory Visit (INDEPENDENT_AMBULATORY_CARE_PROVIDER_SITE_OTHER): Payer: Medicaid Other | Admitting: Cardiology

## 2012-10-13 ENCOUNTER — Encounter: Payer: Self-pay | Admitting: Cardiology

## 2012-10-13 VITALS — BP 112/60 | HR 60 | Ht 65.0 in | Wt 134.0 lb

## 2012-10-13 DIAGNOSIS — Z720 Tobacco use: Secondary | ICD-10-CM

## 2012-10-13 DIAGNOSIS — R0609 Other forms of dyspnea: Secondary | ICD-10-CM

## 2012-10-13 DIAGNOSIS — R0600 Dyspnea, unspecified: Secondary | ICD-10-CM | POA: Insufficient documentation

## 2012-10-13 DIAGNOSIS — R002 Palpitations: Secondary | ICD-10-CM

## 2012-10-13 DIAGNOSIS — F17200 Nicotine dependence, unspecified, uncomplicated: Secondary | ICD-10-CM

## 2012-10-13 HISTORY — DX: Dyspnea, unspecified: R06.00

## 2012-10-13 NOTE — Progress Notes (Addendum)
   HPI: 41 year-old female I initially saw in Sept 2013 for evaluation of palpitations. Chest x-ray in August 2013 showed clear lungs. Left atrial appendage possibly dilated. Troponin normal on 06/01/2012. Hemoglobin 12.8. Potassium 3.2. Renal function normal. Liver functions normal. Pro BNP 41. Patient apparently has had intermittent palpitations for several years. She was evaluated in [place] at an emergency room there by her report. Review of outside records from [facility] show a monitor in February of 2013 that showed sinus with occasional PAC. Echocardiogram in February of 2013 showed an ejection fraction of 70%. There was question right ventricular hypertrophy. There was mild mitral and tricuspid regurgitation. The patient had a stress echo in February of 2013. Ejection fraction was felt to be 45-50%. The ECG portion showed no ST changes but images could not be obtained. Note chest CT in October of 2013 showed no pulmonary embolus. Since I last saw her she continues to complain of palpitations. They are described as a brief flutter. They are not sustained. No syncope. Note she states she did have palpitations when she wore her monitor previously. She also has some dyspnea. This is not related to exertion and she states is there most that time. There is no orthopnea, PND or pedal edema.  Current Outpatient Prescriptions  Medication Sig Dispense Refill  . albuterol (PROAIR HFA) 108 (90 BASE) MCG/ACT inhaler Inhale 2 puffs into the lungs every 6 (six) hours as needed.      . zolpidem (AMBIEN) 10 MG tablet Take 10 mg by mouth at bedtime as needed.         Past Medical History  Diagnosis Date  . Palpitations   . Psychosis   . BIPOLAR DISORDER   . Substance abuse     Past Surgical History  Procedure Date  . Bunionectomy     History   Social History  . Marital Status: Single    Spouse Name: N/A    Number of Children: 2  . Years of Education: N/A   Occupational History  .     Unemployed   Social History Main Topics  . Smoking status: Current Every Day Smoker -- 5 years    Types: Cigarettes  . Smokeless tobacco: Not on file  . Alcohol Use: No  . Drug Use: No     Comment: Previous cocaine, heroin, marijuana  . Sexually Active: Not on file   Other Topics Concern  . Not on file   Social History Narrative  . No narrative on file    ROS: no fevers or chills, productive cough, hemoptysis, dysphasia, odynophagia, melena, hematochezia, dysuria, hematuria, rash, seizure activity, orthopnea, PND, pedal edema, claudication. Remaining systems are negative.  Physical Exam: Well-developed well-nourished in no acute distress.  Skin is warm and dry.  HEENT is normal.  Neck is supple.  Chest is clear to auscultation with normal expansion.  Cardiovascular exam is regular rate and rhythm.  Abdominal exam nontender or distended. No masses palpated. Extremities show no edema. neuro grossly intact

## 2012-10-13 NOTE — Assessment & Plan Note (Addendum)
Etiology unclear. Patient complains of dyspnea during interview but did not appear to be in distress. Echocardiogram report from [facility] showed normal LV function. Previous BNP normal. Chest x-ray normal. Chest CT showed no pulmonary embolus. Note patient states that she was told previously she had a hole in her heart in [place]. I cannot appreciate a murmur on examination and her echocardiogram from February of 2013 showed only mild mitral and tricuspid regurgitation. I explained that I would be happy to review the records from [place] when available but she states she cannot obtain these. I have asked her to followup with her primary care physician for further evaluation. I do not find a cardiac cause of her dyspnea.

## 2012-10-13 NOTE — Assessment & Plan Note (Signed)
Patient counseled on discontinuing. 

## 2012-10-13 NOTE — Assessment & Plan Note (Signed)
Patient continues to have palpitations. I have reviewed her monitor in [facility] from February 2013. She has sinus rhythm with rare PAC. She did state she had symptoms with a monitor in place. I cannot find a significant arrhythmia to treat. I have instructed her that if her palpitations worsen it would be good to have an electrocardiogram of her symptoms or to be connected to a monitor. We could also consider a repeat monitor in the future but I think would be unrevealing at this point.

## 2012-10-13 NOTE — Patient Instructions (Addendum)
Your physician recommends that you schedule a follow-up appointment in: AS NEEDED  

## 2012-11-06 ENCOUNTER — Emergency Department (HOSPITAL_COMMUNITY)
Admission: EM | Admit: 2012-11-06 | Discharge: 2012-11-06 | Disposition: A | Discharge status: Home / Self Care | Payer: Medicaid Other | Attending: Emergency Medicine | Admitting: Emergency Medicine

## 2012-11-06 ENCOUNTER — Encounter (HOSPITAL_COMMUNITY): Payer: Self-pay | Admitting: Emergency Medicine

## 2012-11-06 DIAGNOSIS — Z79899 Other long term (current) drug therapy: Secondary | ICD-10-CM | POA: Insufficient documentation

## 2012-11-06 DIAGNOSIS — F1910 Other psychoactive substance abuse, uncomplicated: Secondary | ICD-10-CM | POA: Insufficient documentation

## 2012-11-06 DIAGNOSIS — R002 Palpitations: Secondary | ICD-10-CM | POA: Insufficient documentation

## 2012-11-06 DIAGNOSIS — R0602 Shortness of breath: Secondary | ICD-10-CM | POA: Insufficient documentation

## 2012-11-06 DIAGNOSIS — F29 Unspecified psychosis not due to a substance or known physiological condition: Secondary | ICD-10-CM | POA: Insufficient documentation

## 2012-11-06 DIAGNOSIS — F319 Bipolar disorder, unspecified: Secondary | ICD-10-CM | POA: Insufficient documentation

## 2012-11-06 DIAGNOSIS — E559 Vitamin D deficiency, unspecified: Secondary | ICD-10-CM

## 2012-11-06 DIAGNOSIS — F17200 Nicotine dependence, unspecified, uncomplicated: Secondary | ICD-10-CM | POA: Insufficient documentation

## 2012-11-06 MED ORDER — CALCIUM CARBONATE-VITAMIN D 500-200 MG-UNIT PO TABS: 2.0000 | ORAL_TABLET | Freq: Every day | ORAL | Status: DC

## 2012-11-06 MED ORDER — ALBUTEROL SULFATE HFA 108 (90 BASE) MCG/ACT IN AERS: 2.0000 | INHALATION_SPRAY | RESPIRATORY_TRACT | Status: DC | PRN

## 2012-11-06 MED ORDER — LORAZEPAM 1 MG PO TABS: 1.0000 mg | ORAL_TABLET | Freq: Three times a day (TID) | ORAL | Status: DC | PRN

## 2012-11-06 NOTE — ED Notes (Signed)
Per EMS: Pt was at the doctors office where she had been going several times wanting a prescription for vitamin d.  Has been told several times that this is over the counter and you don't need a prescription.  Then stated that the doctor was "treating her like a low life" and called 911 and told them she had chest pain.  Upon transport, pt denied chest pain, SOB, etc.  And stated "I think I'm just tired."  Pt grabbed EMS guy's hand and was trying to pull his wedding ring off.  Pt has strange behavior.

## 2012-11-06 NOTE — ED Notes (Signed)
Pt states that she has been seeing a doctor and two cardiologists for chest pain/palpitations.  They told her that the only thing that is wrong with her is that her vit d level was low.  Told her he would call in a prescription of it.  When she went to get it, they told her that it wasn't covered and that her doctor had to fill out a form.  She went back to the doctor to get them to fill out a form and they told her that Vit D is not a prescription and that she needed to get it over the counter.  States that they were not taking her chest pain (THAT SHE WAS NOT HAVING AT THE MOMENT) seriously so she told them to call 911.  When they told her there was no emergency and they would not be calling.  So pt called herself.  Pt states "I am not having any chest pain or shortness of breath.  It's just that sometimes I do.  They told me to take vitamins to feel better and now they won't give them to me".

## 2012-11-06 NOTE — ED Provider Notes (Signed)
History    This chart was scribed for Celene Kras, MD by Marlin Canary. The patient was seen in room [facility]TR1/[facility]. Patient's care was started at 1829.  CSN: [id number]  Arrival date & time 11/06/12  4098   First MD Initiated Contact with Patient 11/06/12 1855      Chief Complaint  Patient presents with  . Chest Pain  . Medical Clearance    (Consider location/radiation/quality/duration/timing/severity/associated sxs/prior treatment) The history is provided by the EMS personnel. No language interpreter was used.   Anita Hill is a 42 y.o. female who presents to the Emergency Department complaining of intermittent moderate SOB onset over 1 year ago. Patient reports associated heart palpitations but she is unsure if it is due to anxiety.Pt was evaluated multiple times within last year by her PCP and two cardiologist. She has had stress test, echo, CT angio, multiple blood tests and ECGs. Pt states she was diagnosed with mitral regurgitation and vitamin d deficiency. She was prescribed vitamin D supplements but has not taken the medication. She takes [place] and OTC medication for pain with relief. Pt states today went to her PCP to pick up her Vitamin D prescription, but states they were upset with her because she came and and pt states she started feeling symptoms again, so called 911 from the office room.  Pt was brought here for evaluation. Pt has NO COMPLAINTS at this time, and wants to be discharged.      Past Medical History  Diagnosis Date  . Palpitations   . Psychosis   . BIPOLAR DISORDER   . Substance abuse     Past Surgical History  Procedure Date  . Bunionectomy     Family History  Problem Relation Age of Onset  . Heart disease Mother     CHF    History  Substance Use Topics  . Smoking status: Current Every Day Smoker -- 5 years    Types: Cigarettes  . Smokeless tobacco: Not on file  . Alcohol Use: No    OB History    Grav Para Term Preterm Abortions  TAB SAB Ect Mult Living                  Review of Systems  Respiratory: Positive for shortness of breath. Negative for chest tightness.   Cardiovascular: Positive for palpitations. Negative for chest pain and leg swelling.  All other systems reviewed and are negative.   A complete 10 system review of systems was obtained and all systems are negative except as noted in the HPI and PMH.   Allergies  Strawberry and Risperidone and related  Home Medications   Current Outpatient Rx  Name  Route  Sig  Dispense  Refill  . ZOLPIDEM TARTRATE 10 MG PO TABS   Oral   Take 10 mg by mouth at bedtime as needed. PTSD/Sleep.           BP 112/63  Pulse 63  Temp 98 F (36.7 C) (Oral)  Resp 18  SpO2 100%  LMP 11/02/2012  Physical Exam  Nursing note and vitals reviewed. Constitutional: She is oriented to person, place, and time. She appears well-developed and well-nourished.  HENT:  Head: Normocephalic and atraumatic.  Eyes: Conjunctivae normal are normal.  Neck: Neck supple.  Cardiovascular: Normal rate, regular rhythm, normal heart sounds and intact distal pulses.  Exam reveals no gallop and no friction rub.   No murmur heard. Pulmonary/Chest: Breath sounds normal. No respiratory distress. She has  no wheezes. She has no rales.  Musculoskeletal: She exhibits no edema.  Neurological: She is alert and oriented to person, place, and time.  Skin: Skin is warm and dry.    ED Course  Procedures (including critical care time)  DIAGNOSTIC STUDIES: Oxygen Saturation is 100% on room air, Normal by my interpretation.    COORDINATION OF CARE:  1801-Patient / Family / Caregiver informed of clinical course, understand medical decision-making process, and agree with plan.    Labs Reviewed - No data to display No results found.   1. Shortness of breath   2. Vitamin D deficiency       MDM  Pt with no complaints, pt states she just panicked and called 911 from doctors office.  States she would like pt be discharged. Pt would like something for anxiety, thinks this may be panic attack related. Will provide few ativans. Also vitamin d supplement. Will follow up with PCP. i do not think pt needs any further testing at this time. Vs normal.   Filed Vitals:   11/06/12 1847  BP: 112/63  Pulse: 63  Temp: 98 F (36.7 C)  Resp: 18       I personally performed the services described in this documentation, which was scribed in my presence. The recorded information has been reviewed and is accurate.    Lottie Mussel, PA 11/07/12 [phone]

## 2012-11-06 NOTE — ED Notes (Signed)
Pt discharged home; only complaint of leg pain 10/10

## 2012-11-08 NOTE — ED Provider Notes (Signed)
Medical screening examination/treatment/procedure(s) were performed by non-physician practitioner and as supervising physician I was immediately available for consultation/collaboration.    Celene Kras, MD 11/08/12 [phone]

## 2012-12-31 ENCOUNTER — Encounter (HOSPITAL_COMMUNITY): Payer: Self-pay | Admitting: *Deleted

## 2012-12-31 ENCOUNTER — Emergency Department (HOSPITAL_COMMUNITY)
Admission: EM | Admit: 2012-12-31 | Discharge: 2012-12-31 | Disposition: A | Discharge status: Home / Self Care | Payer: Medicaid Other | Attending: Emergency Medicine | Admitting: Emergency Medicine

## 2012-12-31 DIAGNOSIS — Z23 Encounter for immunization: Secondary | ICD-10-CM | POA: Insufficient documentation

## 2012-12-31 DIAGNOSIS — Y9301 Activity, walking, marching and hiking: Secondary | ICD-10-CM | POA: Insufficient documentation

## 2012-12-31 DIAGNOSIS — IMO0002 Reserved for concepts with insufficient information to code with codable children: Secondary | ICD-10-CM | POA: Insufficient documentation

## 2012-12-31 DIAGNOSIS — S0180XA Unspecified open wound of other part of head, initial encounter: Secondary | ICD-10-CM | POA: Insufficient documentation

## 2012-12-31 DIAGNOSIS — Y929 Unspecified place or not applicable: Secondary | ICD-10-CM | POA: Insufficient documentation

## 2012-12-31 DIAGNOSIS — F17200 Nicotine dependence, unspecified, uncomplicated: Secondary | ICD-10-CM | POA: Insufficient documentation

## 2012-12-31 DIAGNOSIS — Z791 Long term (current) use of non-steroidal anti-inflammatories (NSAID): Secondary | ICD-10-CM | POA: Insufficient documentation

## 2012-12-31 DIAGNOSIS — Z8659 Personal history of other mental and behavioral disorders: Secondary | ICD-10-CM | POA: Insufficient documentation

## 2012-12-31 DIAGNOSIS — F1910 Other psychoactive substance abuse, uncomplicated: Secondary | ICD-10-CM | POA: Insufficient documentation

## 2012-12-31 DIAGNOSIS — M2550 Pain in unspecified joint: Secondary | ICD-10-CM | POA: Insufficient documentation

## 2012-12-31 MED ORDER — IBUPROFEN 600 MG PO TABS: 600.0000 mg | ORAL_TABLET | Freq: Four times a day (QID) | ORAL | Status: DC | PRN

## 2012-12-31 MED ORDER — TETANUS-DIPHTH-ACELL PERTUSSIS 5-2.5-18.5 LF-MCG/0.5 IM SUSP
0.5000 mL | Freq: Once | INTRAMUSCULAR | Status: AC
  Administered 2012-12-31: 0.5 mL via INTRAMUSCULAR
  Filled 2012-12-31: qty 0.5
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
## 2013-01-02 NOTE — ED Provider Notes (Signed)
Medical screening examination/treatment/procedure(s) were performed by non-physician practitioner and as supervising physician I was immediately available for consultation/collaboration.  Camden Knotek M Aracelys Glade, MD 01/02/13 1328 

## 2013-01-06 ENCOUNTER — Encounter (HOSPITAL_COMMUNITY): Payer: Self-pay | Admitting: *Deleted

## 2013-01-06 ENCOUNTER — Emergency Department (HOSPITAL_COMMUNITY)
Admission: EM | Admit: 2013-01-06 | Discharge: 2013-01-06 | Disposition: A | Discharge status: Home / Self Care | Payer: Medicaid Other | Attending: Emergency Medicine | Admitting: Emergency Medicine

## 2013-01-06 DIAGNOSIS — T7421XA Adult sexual abuse, confirmed, initial encounter: Secondary | ICD-10-CM | POA: Insufficient documentation

## 2013-01-06 DIAGNOSIS — Z8679 Personal history of other diseases of the circulatory system: Secondary | ICD-10-CM | POA: Insufficient documentation

## 2013-01-06 DIAGNOSIS — Z3202 Encounter for pregnancy test, result negative: Secondary | ICD-10-CM | POA: Insufficient documentation

## 2013-01-06 DIAGNOSIS — F17200 Nicotine dependence, unspecified, uncomplicated: Secondary | ICD-10-CM | POA: Insufficient documentation

## 2013-01-06 DIAGNOSIS — Z8659 Personal history of other mental and behavioral disorders: Secondary | ICD-10-CM | POA: Insufficient documentation

## 2013-01-06 DIAGNOSIS — S0180XA Unspecified open wound of other part of head, initial encounter: Secondary | ICD-10-CM | POA: Insufficient documentation

## 2013-01-06 MED ORDER — METRONIDAZOLE 500 MG PO TABS
ORAL_TABLET | ORAL | Status: AC
  Filled 2013-01-06: qty 4

## 2013-01-06 MED ORDER — LEVONORGESTREL 0.75 MG PO TABS
ORAL_TABLET | ORAL | Status: AC
  Filled 2013-01-06: qty 2

## 2013-01-06 MED ORDER — AZITHROMYCIN 1 G PO PACK
PACK | ORAL | Status: AC
  Administered 2013-01-06: 1 g
  Filled 2013-01-06: qty 1

## 2013-01-06 MED ORDER — CEFIXIME 400 MG PO TABS
ORAL_TABLET | ORAL | Status: AC
  Administered 2013-01-06: 400 mg
  Filled 2013-01-06: qty 1

## 2013-01-06 MED ORDER — PROMETHAZINE HCL 25 MG PO TABS
ORAL_TABLET | ORAL | Status: AC
  Filled 2013-01-06: qty 3
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
## 2013-01-08 LAB — POCT PREGNANCY, URINE: Preg Test, Ur: NEGATIVE

## 2013-01-24 ENCOUNTER — Encounter: Payer: Medicaid Other | Admitting: Advanced Practice Midwife

## 2013-02-03 ENCOUNTER — Emergency Department (HOSPITAL_COMMUNITY)
Admission: EM | Admit: 2013-02-03 | Discharge: 2013-02-03 | Disposition: A | Discharge status: Home / Self Care | Payer: Medicaid Other | Attending: Emergency Medicine | Admitting: Emergency Medicine

## 2013-02-03 ENCOUNTER — Encounter (HOSPITAL_COMMUNITY): Payer: Self-pay | Admitting: Emergency Medicine

## 2013-02-03 DIAGNOSIS — Z8679 Personal history of other diseases of the circulatory system: Secondary | ICD-10-CM | POA: Insufficient documentation

## 2013-02-03 DIAGNOSIS — R42 Dizziness and giddiness: Secondary | ICD-10-CM | POA: Insufficient documentation

## 2013-02-03 DIAGNOSIS — R51 Headache: Secondary | ICD-10-CM | POA: Insufficient documentation

## 2013-02-03 DIAGNOSIS — Z8659 Personal history of other mental and behavioral disorders: Secondary | ICD-10-CM | POA: Insufficient documentation

## 2013-02-03 DIAGNOSIS — F17200 Nicotine dependence, unspecified, uncomplicated: Secondary | ICD-10-CM | POA: Insufficient documentation

## 2013-02-03 MED ORDER — NAPROXEN 250 MG PO TABS: 250.0000 mg | ORAL_TABLET | Freq: Two times a day (BID) | ORAL | Status: DC

## 2013-02-03 MED ORDER — NAPROXEN 250 MG PO TABS
250.0000 mg | ORAL_TABLET | Freq: Once | ORAL | Status: AC
  Administered 2013-02-03: 250 mg via ORAL
  Filled 2013-02-03: qty 1

## 2013-02-03 NOTE — ED Notes (Signed)
Reviewed prescription instruction with patient and to make a follow up appt with PCP ASAP.
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
## 2013-03-03 ENCOUNTER — Emergency Department (HOSPITAL_COMMUNITY)
Admission: EM | Admit: 2013-03-03 | Discharge: 2013-03-03 | Disposition: A | Discharge status: Home / Self Care | Payer: Medicaid Other | Attending: Emergency Medicine | Admitting: Emergency Medicine

## 2013-03-03 ENCOUNTER — Emergency Department (HOSPITAL_COMMUNITY): Payer: Medicaid Other

## 2013-03-03 DIAGNOSIS — F1910 Other psychoactive substance abuse, uncomplicated: Secondary | ICD-10-CM | POA: Insufficient documentation

## 2013-03-03 DIAGNOSIS — Z8659 Personal history of other mental and behavioral disorders: Secondary | ICD-10-CM | POA: Insufficient documentation

## 2013-03-03 DIAGNOSIS — Z3202 Encounter for pregnancy test, result negative: Secondary | ICD-10-CM | POA: Insufficient documentation

## 2013-03-03 DIAGNOSIS — F17200 Nicotine dependence, unspecified, uncomplicated: Secondary | ICD-10-CM | POA: Insufficient documentation

## 2013-03-03 DIAGNOSIS — R42 Dizziness and giddiness: Secondary | ICD-10-CM | POA: Insufficient documentation

## 2013-03-03 LAB — CBC WITH DIFFERENTIAL/PLATELET
Eosinophils Absolute: 0.2 10*3/uL (ref 0.0–0.7)
Eosinophils Relative: 3 % (ref 0–5)
HCT: 33.6 % — ABNORMAL LOW (ref 36.0–46.0)
Hemoglobin: 12.1 g/dL (ref 12.0–15.0)
Lymphocytes Relative: 31 % (ref 12–46)
Lymphs Abs: 2.4 10*3/uL (ref 0.7–4.0)
MCH: 29.2 pg (ref 26.0–34.0)
MCV: 81 fL (ref 78.0–100.0)
Monocytes Relative: 7 % (ref 3–12)
Platelets: 180 10*3/uL (ref 150–400)
RBC: 4.15 MIL/uL (ref 3.87–5.11)
WBC: 7.8 10*3/uL (ref 4.0–10.5)

## 2013-03-03 LAB — COMPREHENSIVE METABOLIC PANEL
ALT: 12 U/L (ref 0–35)
Alkaline Phosphatase: 51 U/L (ref 39–117)
BUN: 9 mg/dL (ref 6–23)
CO2: 26 mEq/L (ref 19–32)
Calcium: 9.1 mg/dL (ref 8.4–10.5)
GFR calc Af Amer: >90 mL/min (ref >90)
GFR calc non Af Amer: >90 mL/min (ref >90)
Glucose, Bld: 110 mg/dL — ABNORMAL HIGH (ref 70–99)
Sodium: 140 mEq/L (ref 135–145)

## 2013-03-03 LAB — RAPID URINE DRUG SCREEN, HOSP PERFORMED
Amphetamines: NOT DETECTED
Tetrahydrocannabinol: NOT DETECTED

## 2013-03-03 LAB — URINE MICROSCOPIC-ADD ON

## 2013-03-03 LAB — URINALYSIS, ROUTINE W REFLEX MICROSCOPIC
Hgb urine dipstick: NEGATIVE
Protein, ur: NEGATIVE mg/dL
Urobilinogen, UA: 1 mg/dL (ref 0.0–1.0)

## 2013-03-03 LAB — PREGNANCY, URINE: Preg Test, Ur: NEGATIVE

## 2013-03-03 NOTE — ED Notes (Addendum)
Not feeling well..dizzy. Not constant. No fatigue. "just sick." miss x 2 appts. For skin biopsy. Testing for cancer. Is starting to feel bad.

## 2013-03-03 NOTE — ED Notes (Signed)
Asked pt what brought her in today. Pt states "I can't remember what I told the nurse." Pt denies dizziness at this time. When asked if she feels okay, pt reports no but unsure what doesn't feel well.

## 2013-03-03 NOTE — ED Notes (Signed)
The patient is AOx4 and comfortable with her discharge instructions. 
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
## 2013-03-03 NOTE — ED Notes (Signed)
Pt ambulated with no difficulty. Denies dizziness.

## 2013-03-04 ENCOUNTER — Emergency Department (HOSPITAL_COMMUNITY)
Admission: EM | Admit: 2013-03-04 | Discharge: 2013-03-04 | Disposition: A | Discharge status: Home / Self Care | Payer: Medicaid Other | Attending: Emergency Medicine | Admitting: Emergency Medicine

## 2013-03-04 DIAGNOSIS — Z8679 Personal history of other diseases of the circulatory system: Secondary | ICD-10-CM | POA: Insufficient documentation

## 2013-03-04 DIAGNOSIS — Z8659 Personal history of other mental and behavioral disorders: Secondary | ICD-10-CM | POA: Insufficient documentation

## 2013-03-04 DIAGNOSIS — R509 Fever, unspecified: Secondary | ICD-10-CM | POA: Insufficient documentation

## 2013-03-04 DIAGNOSIS — G4700 Insomnia, unspecified: Secondary | ICD-10-CM | POA: Insufficient documentation

## 2013-03-04 DIAGNOSIS — F17200 Nicotine dependence, unspecified, uncomplicated: Secondary | ICD-10-CM | POA: Insufficient documentation

## 2013-03-04 DIAGNOSIS — N390 Urinary tract infection, site not specified: Secondary | ICD-10-CM | POA: Insufficient documentation

## 2013-03-04 LAB — URINE MICROSCOPIC-ADD ON

## 2013-03-04 LAB — CBC
Platelets: 176 10*3/uL (ref 150–400)
RBC: 4.2 MIL/uL (ref 3.87–5.11)
WBC: 6.3 10*3/uL (ref 4.0–10.5)

## 2013-03-04 LAB — POCT I-STAT, CHEM 8
BUN: 9 mg/dL (ref 6–23)
Chloride: 105 mEq/L (ref 96–112)
Sodium: 141 mEq/L (ref 135–145)

## 2013-03-04 LAB — URINALYSIS, ROUTINE W REFLEX MICROSCOPIC
Nitrite: POSITIVE — AB
Specific Gravity, Urine: 1.018 (ref 1.005–1.030)
Urobilinogen, UA: 0.2 mg/dL (ref 0.0–1.0)

## 2013-03-04 MED ORDER — NITROFURANTOIN MONOHYD MACRO 100 MG PO CAPS: 100.0000 mg | ORAL_CAPSULE | Freq: Two times a day (BID) | ORAL | Status: DC

## 2013-03-04 MED ORDER — NAPROXEN 375 MG PO TABS: 375.0000 mg | ORAL_TABLET | Freq: Two times a day (BID) | ORAL | Status: DC

## 2013-03-04 MED ORDER — IBUPROFEN 200 MG PO TABS
600.0000 mg | ORAL_TABLET | Freq: Once | ORAL | Status: AC
  Administered 2013-03-04: 600 mg via ORAL
  Filled 2013-03-04: qty 3

## 2013-03-04 NOTE — ED Notes (Signed)
Bilateral lower abd. Nausea, no vomiting, no diarrhea, not constipated.  Pain. Here yesterday for weakness, dizziness and was concerned that we didn't keep her.

## 2013-03-04 NOTE — ED Provider Notes (Signed)
Medical screening examination/treatment/procedure(s) were performed by non-physician practitioner and as supervising physician I was immediately available for consultation/collaboration.  Doug Sou, MD 03/04/13 [phone]

## 2013-03-04 NOTE — ED Provider Notes (Signed)
History     CSN: [id number]  Arrival date & time 03/04/13  0105   First MD Initiated Contact with Patient 03/04/13 0142      Chief Complaint  Patient presents with  . Abdominal Pain    (Consider location/radiation/quality/duration/timing/severity/associated sxs/prior treatment) HPI Comments: Pt w hx of psychosis, bipolar and substance abuse presnted to ER for abdominal pain and tactile fever. Onset of pain was tonight and located in periumbilical region, described as pulling. No radiation.  "Feel like I have a temperature, but I don't. I feel well, then I go back to being sick again" Pt denies N/V/D, actual fever, anorexia, dysuria or vaginal dc. Pt reports she has not had sexual intercourse  since 2005. Patient's last menstrual period was 02/16/2013. Pt reports insomnia because overwhelmed with trying to move. Pt is not currently taking any psychiatric medication. Pt lives with mother.   Patient is a 42 y.o. female presenting with abdominal pain.  Abdominal Pain   Past Medical History  Diagnosis Date  . Palpitations   . Psychosis   . BIPOLAR DISORDER   . Substance abuse     Past Surgical History  Procedure Laterality Date  . Bunionectomy      Family History  Problem Relation Age of Onset  . Heart disease Mother     CHF    History  Substance Use Topics  . Smoking status: Current Every Day Smoker -- 5 years    Types: Cigarettes  . Smokeless tobacco: Not on file  . Alcohol Use: No    OB History   Grav Para Term Preterm Abortions TAB SAB Ect Mult Living                  Review of Systems  Gastrointestinal: Positive for abdominal pain.    Allergies  Strawberry and Risperidone and related  Home Medications  No current outpatient prescriptions on file.  LMP 02/16/2013  Physical Exam  Nursing note and vitals reviewed. Constitutional: She is oriented to person, place, and time. She appears well-developed and well-nourished. No distress.  HENT:  Head:  Normocephalic and atraumatic.  Eyes: Conjunctivae and EOM are normal.  Neck: Normal range of motion.  Cardiovascular: Normal rate and intact distal pulses.   Pulmonary/Chest: Effort normal and breath sounds normal. No respiratory distress.  Abdominal: Soft. She exhibits no distension and no mass. There is no rebound and no guarding.  Mild suprapubic tenderness, no peritoneal signs  Musculoskeletal: Normal range of motion.  Neurological: She is alert and oriented to person, place, and time.  Skin: Skin is warm and dry. No rash noted. She is not diaphoretic.  Psychiatric: She has a normal mood and affect. Her behavior is normal.    ED Course  Procedures (including critical care time)  Labs Reviewed  URINALYSIS, ROUTINE W REFLEX MICROSCOPIC - Abnormal; Notable for the following:    APPearance CLOUDY (*)    Hgb urine dipstick SMALL (*)    Ketones, ur 15 (*)    Nitrite POSITIVE (*)    Leukocytes, UA SMALL (*)    All other components within normal limits  CBC - Abnormal; Notable for the following:    HCT 33.9 (*)    All other components within normal limits  URINE MICROSCOPIC-ADD ON - Abnormal; Notable for the following:    Squamous Epithelial / LPF FEW (*)    Bacteria, UA MANY (*)    All other components within normal limits  POCT I-STAT, CHEM 8 - Abnormal; Notable  for the following:    Potassium 3.1 (*)    All other components within normal limits   Ct Head Wo Contrast  03/03/2013  *RADIOLOGY REPORT*  Clinical Data: Dizzy  CT HEAD WITHOUT CONTRAST  Technique:  Contiguous axial images were obtained from the base of the skull through the vertex without contrast.  Comparison: None.  Findings: No acute intracranial hemorrhage, acute infarction, mass lesion, mass effect, midline shift or hydrocephalus.  Gray-white differentiation is preserved throughout.  No focal soft tissue or calvarial abnormality.  The globes are intact and orbits are unremarkable.  IMPRESSION: Negative CT scan of the  head   Original Report Authenticated By: Malachy Moan, M.D.      No diagnosis found.  BP 107/74  Pulse 71  Temp(Src) 97.7 F (36.5 C)  Resp 16  SpO2 99%  LMP 02/16/2013    MDM  Questionable UTi? Will send culture and treat   Pt has been diagnosed with a UTI. Pt is afebrile, no CVA tenderness, normotensive, and denies N/V. Pt to be dc home with antibiotics and instructions to follow up with PCP if symptoms persist.         Jaci Carrel, PA-C 03/04/13 [phone]

## 2013-03-04 NOTE — ED Notes (Signed)
Denies vaginal d/c, painful urination, and constipation.

## 2013-03-05 LAB — URINE CULTURE

## 2013-03-05 NOTE — ED Provider Notes (Signed)
Medical screening examination/treatment/procedure(s) were performed by non-physician practitioner and as supervising physician I was immediately available for consultation/collaboration.   Bryne Lindon L Thomos Domine, MD 03/05/13 1921 

## 2013-03-06 LAB — URINE CULTURE

## 2013-03-06 NOTE — ED Notes (Signed)
Post ED Visit - Positive Culture Follow-up  Culture report reviewed by antimicrobial stewardship pharmacist: []  Wes Dulaney, Pharm.D., BCPS [x]  Celedonio Miyamoto, [address].D., BCPS []  Georgina Pillion, [address].D., BCPS []  [place], [place].D., BCPS, AAHIVP []  Estella Husk, Pharm.D., BCPS, AAHIV  Positive Urine Culture Treated with  Macrobid, organism sensitive to the same and no further patient follow-up is required at this time.  Larena Sox 03/06/2013, 6:55 PM

## 2013-03-09 ENCOUNTER — Encounter (HOSPITAL_COMMUNITY): Payer: Self-pay

## 2013-03-09 ENCOUNTER — Other Ambulatory Visit: Payer: Self-pay

## 2013-03-09 DIAGNOSIS — R071 Chest pain on breathing: Secondary | ICD-10-CM | POA: Insufficient documentation

## 2013-03-09 DIAGNOSIS — F17200 Nicotine dependence, unspecified, uncomplicated: Secondary | ICD-10-CM | POA: Insufficient documentation

## 2013-03-09 DIAGNOSIS — R5381 Other malaise: Secondary | ICD-10-CM | POA: Insufficient documentation

## 2013-03-09 DIAGNOSIS — R5383 Other fatigue: Secondary | ICD-10-CM | POA: Insufficient documentation

## 2013-03-09 DIAGNOSIS — Z8659 Personal history of other mental and behavioral disorders: Secondary | ICD-10-CM | POA: Insufficient documentation

## 2013-03-09 DIAGNOSIS — Z8679 Personal history of other diseases of the circulatory system: Secondary | ICD-10-CM | POA: Insufficient documentation

## 2013-03-09 DIAGNOSIS — N390 Urinary tract infection, site not specified: Secondary | ICD-10-CM | POA: Insufficient documentation

## 2013-03-09 DIAGNOSIS — Z3202 Encounter for pregnancy test, result negative: Secondary | ICD-10-CM | POA: Insufficient documentation
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
## 2013-03-10 ENCOUNTER — Emergency Department (HOSPITAL_COMMUNITY): Payer: Medicaid Other

## 2013-03-10 ENCOUNTER — Encounter (HOSPITAL_COMMUNITY): Payer: Self-pay | Admitting: Emergency Medicine

## 2013-03-10 ENCOUNTER — Emergency Department (HOSPITAL_COMMUNITY)
Admission: EM | Admit: 2013-03-10 | Discharge: 2013-03-10 | Disposition: A | Discharge status: Home / Self Care | Payer: Medicaid Other | Attending: Emergency Medicine | Admitting: Emergency Medicine

## 2013-03-10 DIAGNOSIS — N390 Urinary tract infection, site not specified: Secondary | ICD-10-CM

## 2013-03-10 DIAGNOSIS — R0789 Other chest pain: Secondary | ICD-10-CM

## 2013-03-10 HISTORY — DX: Delusional disorders: F22

## 2013-03-10 LAB — URINALYSIS, ROUTINE W REFLEX MICROSCOPIC
Leukocytes, UA: NEGATIVE
Nitrite: NEGATIVE
Protein, ur: NEGATIVE mg/dL
Specific Gravity, Urine: 1.019 (ref 1.005–1.030)
Urobilinogen, UA: 1 mg/dL (ref 0.0–1.0)

## 2013-03-10 LAB — CBC
MCV: 82.9 fL (ref 78.0–100.0)
Platelets: 232 10*3/uL (ref 150–400)
RBC: 4.1 MIL/uL (ref 3.87–5.11)
WBC: 5.2 10*3/uL (ref 4.0–10.5)

## 2013-03-10 LAB — BASIC METABOLIC PANEL
CO2: 29 mEq/L (ref 19–32)
Chloride: 105 mEq/L (ref 96–112)
Creatinine, Ser: 0.86 mg/dL (ref 0.50–1.10)
Potassium: 3.8 mEq/L (ref 3.5–5.1)

## 2013-03-10 LAB — POCT I-STAT TROPONIN I: Troponin i, poc: 0 ng/mL (ref 0.00–0.08)

## 2013-03-10 LAB — URINE MICROSCOPIC-ADD ON

## 2013-03-10 MED ORDER — LIDOCAINE HCL (PF) 1 % IJ SOLN
INTRAMUSCULAR | Status: AC
  Administered 2013-03-10: 05:00:00
  Filled 2013-03-10: qty 5

## 2013-03-10 MED ORDER — CEFTRIAXONE SODIUM 1 G IJ SOLR
1.0000 g | Freq: Once | INTRAMUSCULAR | Status: AC
  Administered 2013-03-10: 1 g via INTRAMUSCULAR
  Filled 2013-03-10: qty 10

## 2013-03-10 MED ORDER — CEPHALEXIN 500 MG PO CAPS: 500.0000 mg | ORAL_CAPSULE | Freq: Four times a day (QID) | ORAL | Status: DC

## 2013-03-10 NOTE — ED Notes (Signed)
Pt. Refusing to be d/c'd. Dr. Clarene Duke made aware. Security had to be called to have pt. Escorted. When security arrived, pt. Was stuffing her bags with hospital blankets; [employer] called and pt. Escorted out of hospital.
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
## 2013-05-09 ENCOUNTER — Encounter (HOSPITAL_COMMUNITY): Payer: Self-pay

## 2013-05-09 ENCOUNTER — Emergency Department (HOSPITAL_COMMUNITY)
Admission: EM | Admit: 2013-05-09 | Discharge: 2013-05-09 | Disposition: A | Discharge status: Home / Self Care | Payer: Medicaid Other | Attending: Emergency Medicine | Admitting: Emergency Medicine

## 2013-05-09 ENCOUNTER — Emergency Department (HOSPITAL_COMMUNITY): Payer: Medicaid Other

## 2013-05-09 DIAGNOSIS — Z8659 Personal history of other mental and behavioral disorders: Secondary | ICD-10-CM | POA: Insufficient documentation

## 2013-05-09 DIAGNOSIS — Z3202 Encounter for pregnancy test, result negative: Secondary | ICD-10-CM | POA: Insufficient documentation

## 2013-05-09 DIAGNOSIS — F29 Unspecified psychosis not due to a substance or known physiological condition: Secondary | ICD-10-CM | POA: Insufficient documentation

## 2013-05-09 DIAGNOSIS — F17200 Nicotine dependence, unspecified, uncomplicated: Secondary | ICD-10-CM | POA: Insufficient documentation

## 2013-05-09 DIAGNOSIS — Z8679 Personal history of other diseases of the circulatory system: Secondary | ICD-10-CM | POA: Insufficient documentation

## 2013-05-09 DIAGNOSIS — R42 Dizziness and giddiness: Secondary | ICD-10-CM

## 2013-05-09 DIAGNOSIS — F319 Bipolar disorder, unspecified: Secondary | ICD-10-CM | POA: Insufficient documentation

## 2013-05-09 LAB — BASIC METABOLIC PANEL
BUN: 8 mg/dL (ref 6–23)
CO2: 27 mEq/L (ref 19–32)
Chloride: 104 mEq/L (ref 96–112)
GFR calc non Af Amer: >90 mL/min (ref >90)
Glucose, Bld: 106 mg/dL — ABNORMAL HIGH (ref 70–99)
Potassium: 3.6 mEq/L (ref 3.5–5.1)
Sodium: 140 mEq/L (ref 135–145)

## 2013-05-09 LAB — URINE MICROSCOPIC-ADD ON

## 2013-05-09 LAB — CBC
HCT: 35.5 % — ABNORMAL LOW (ref 36.0–46.0)
Hemoglobin: 12.5 g/dL (ref 12.0–15.0)
MCHC: 35.2 g/dL (ref 30.0–36.0)
RBC: 4.33 MIL/uL (ref 3.87–5.11)

## 2013-05-09 LAB — POCT I-STAT TROPONIN I

## 2013-05-09 LAB — URINALYSIS, ROUTINE W REFLEX MICROSCOPIC
Glucose, UA: NEGATIVE mg/dL
Ketones, ur: NEGATIVE mg/dL
Nitrite: NEGATIVE
Specific Gravity, Urine: 1.018 (ref 1.005–1.030)
pH: 6 (ref 5.0–8.0)

## 2013-05-09 LAB — POCT PREGNANCY, URINE: Preg Test, Ur: NEGATIVE

## 2013-05-09 NOTE — ED Notes (Signed)
MD at bedside. 

## 2013-05-09 NOTE — ED Notes (Signed)
Advised of the wait

## 2013-05-09 NOTE — ED Notes (Addendum)
Intermittent lt. Chest pain with burning.  Intermittent sob and dizziness. Skin is warm and dry.  Rexp. E/u, Denies any n/v/d.  Intermittent fevers denies any cough or cold symptoms, but does report that she is having chest congestion

## 2013-05-09 NOTE — ED Provider Notes (Signed)
History    CSN: [id number] Arrival date & time 05/09/13  1715  First MD Initiated Contact with Patient 05/09/13 2032     Chief Complaint  Patient presents with  . Chest Pain   (Consider location/radiation/quality/duration/timing/severity/associated sxs/prior Treatment) HPI Comments: Patient presents emergency department with chief complaint of intermittent dizziness. She states that she became dizzy last Saturday, and passed out. She states that she is concerned that she might have hurt her chest. Or that she was having a heart attack. She has been seen previously for chest pain. She has many psychological issues including psychosis and bipolar disorder.Currently she is not complaining of any chest pain, shortness of breath, nausea, vomiting, diarrhea, constipation, vaginal discharge, or dysuria.  The history is provided by the patient. No language interpreter was used.   Past Medical History  Diagnosis Date  . Palpitations   . Psychosis   . BIPOLAR DISORDER   . Substance abuse   . Delusions    Past Surgical History  Procedure Laterality Date  . Bunionectomy     Family History  Problem Relation Age of Onset  . Heart disease Mother     CHF   History  Substance Use Topics  . Smoking status: Current Every Day Smoker -- 5 years    Types: Cigarettes  . Smokeless tobacco: Not on file  . Alcohol Use: No   OB History   Grav Para Term Preterm Abortions TAB SAB Ect Mult Living                 Review of Systems  All other systems reviewed and are negative.    Allergies  Strawberry and Risperidone and related  Home Medications  No current outpatient prescriptions on file. BP 111/64  Pulse 74  Temp(Src) 98.7 F (37.1 C) (Oral)  Resp 16  SpO2 100% Physical Exam  Nursing note and vitals reviewed. Constitutional: She is oriented to person, place, and time. She appears well-developed and well-nourished.  HENT:  Head: Normocephalic and atraumatic.  Eyes: Conjunctivae  and EOM are normal. Pupils are equal, round, and reactive to light.  Neck: Normal range of motion. Neck supple.  Cardiovascular: Normal rate and regular rhythm.  Exam reveals no gallop and no friction rub.   No murmur heard. Pulmonary/Chest: Effort normal and breath sounds normal. No respiratory distress. She has no wheezes. She has no rales. She exhibits no tenderness.  Abdominal: Soft. Bowel sounds are normal. She exhibits no distension and no mass. There is no tenderness. There is no rebound and no guarding.  Musculoskeletal: Normal range of motion. She exhibits no edema and no tenderness.  Neurological: She is alert and oriented to person, place, and time.  Skin: Skin is warm and dry.  Psychiatric: She has a normal mood and affect. Her behavior is normal. Judgment and thought content normal.    ED Course  Procedures (including critical care time) Labs Reviewed  CBC - Abnormal; Notable for the following:    HCT 35.5 (*)    All other components within normal limits  BASIC METABOLIC PANEL - Abnormal; Notable for the following:    Glucose, Bld 106 (*)    All other components within normal limits  URINALYSIS, ROUTINE W REFLEX MICROSCOPIC  POCT I-STAT TROPONIN I   Dg Chest 2 View  05/09/2013   *RADIOLOGY REPORT*  Clinical Data: Chest pain.  CHEST - 2 VIEW  Comparison: 03/10/2013  Findings: Heart and mediastinal contours are within normal limits. No focal opacities or  effusions.  No acute bony abnormality.  IMPRESSION: Negative.   Original Report Authenticated By: Charlett Nose, M.D.   1. Dizziness     MDM  Patient with intermittent dizziness. Has a history of palpitations. She has been seen by [facility].  Patient appears well, she is not in any apparent distress. States that she wants to be evaluated for the episode of dizziness 4 days ago. She has been seen by and discussed with Dr. Bebe Shaggy, who agrees that the patient may be discharged to home with cardiology followup. Patient  is stable for discharge.  Roxy Horseman, PA-C 05/09/13 2337

## 2013-05-09 NOTE — ED Notes (Signed)
No answer

## 2013-05-09 NOTE — ED Notes (Signed)
Pt ambulatory to restroom with steady gait.

## 2013-05-10 ENCOUNTER — Encounter (HOSPITAL_COMMUNITY): Payer: Self-pay

## 2013-05-10 ENCOUNTER — Emergency Department (HOSPITAL_COMMUNITY)
Admission: EM | Admit: 2013-05-10 | Discharge: 2013-05-10 | Discharge status: Against Medical Advice | Payer: Medicaid Other | Attending: Emergency Medicine | Admitting: Emergency Medicine

## 2013-05-10 DIAGNOSIS — F17200 Nicotine dependence, unspecified, uncomplicated: Secondary | ICD-10-CM | POA: Insufficient documentation

## 2013-05-10 DIAGNOSIS — H538 Other visual disturbances: Secondary | ICD-10-CM | POA: Insufficient documentation

## 2013-05-10 DIAGNOSIS — R002 Palpitations: Secondary | ICD-10-CM | POA: Insufficient documentation

## 2013-05-10 DIAGNOSIS — R109 Unspecified abdominal pain: Secondary | ICD-10-CM | POA: Insufficient documentation

## 2013-05-10 DIAGNOSIS — R42 Dizziness and giddiness: Secondary | ICD-10-CM | POA: Insufficient documentation

## 2013-05-10 DIAGNOSIS — R51 Headache: Secondary | ICD-10-CM | POA: Insufficient documentation

## 2013-05-10 MED ORDER — KETOROLAC TROMETHAMINE 30 MG/ML IJ SOLN: 30.0000 mg | Freq: Once | INTRAMUSCULAR | Status: DC

## 2013-05-10 MED ORDER — METOCLOPRAMIDE HCL 10 MG PO TABS
10.0000 mg | ORAL_TABLET | ORAL | Status: DC
  Filled 2013-05-10: qty 1

## 2013-05-10 MED ORDER — IBUPROFEN 200 MG PO TABS
600.0000 mg | ORAL_TABLET | Freq: Once | ORAL | Status: DC
  Filled 2013-05-10: qty 3

## 2013-05-10 MED ORDER — SODIUM CHLORIDE 0.9 % IV BOLUS (SEPSIS): 1000.0000 mL | Freq: Once | INTRAVENOUS | Status: DC

## 2013-05-10 NOTE — ED Notes (Signed)
2245  Tried to give the pt her meds and the pt is not in the room.

## 2013-05-10 NOTE — ED Notes (Signed)
Pt states that she came back today because she was told to come back if her symptoms did not get better, or they got worse. Pt states that she has not had any relief of her symptoms.

## 2013-05-10 NOTE — ED Notes (Signed)
Pt c/o abd cramping, fever and dizziness, seen here last night for the same symptoms but also associated with palpitations and blurred vision. EKG was normal and all blood work came back negative. Pt was instructed to f/u with a neurologist. No neuro deficits, grips and strengths equal bilaterally, no facial or arm drift.

## 2013-05-11 LAB — URINE CULTURE

## 2013-05-12 NOTE — ED Provider Notes (Signed)
Medical screening examination/treatment/procedure(s) were conducted as a shared visit with non-physician practitioner(s) and myself.  I personally evaluated the patient during the encounter   Date: 05/09/2013  Rate: 80  Rhythm: normal sinus rhythm  QRS Axis: normal  Intervals: normal  ST/T Wave abnormalities: nonspecific ST changes  Conduction Disutrbances:none   Pt stable in the Ed, ambulatory, and feel she is appropriate for outpatient management     Joya Gaskins, MD 05/12/13 [phone]

## 2013-05-15 ENCOUNTER — Emergency Department (HOSPITAL_COMMUNITY)
Admission: EM | Admit: 2013-05-15 | Discharge: 2013-05-15 | Disposition: A | Discharge status: Home / Self Care | Payer: Medicaid Other | Attending: Emergency Medicine | Admitting: Emergency Medicine

## 2013-05-15 ENCOUNTER — Encounter (HOSPITAL_COMMUNITY): Payer: Self-pay | Admitting: Emergency Medicine

## 2013-05-15 DIAGNOSIS — Z5900 Homelessness unspecified: Secondary | ICD-10-CM | POA: Insufficient documentation

## 2013-05-15 DIAGNOSIS — R42 Dizziness and giddiness: Secondary | ICD-10-CM | POA: Insufficient documentation

## 2013-05-15 DIAGNOSIS — Z8659 Personal history of other mental and behavioral disorders: Secondary | ICD-10-CM | POA: Insufficient documentation

## 2013-05-15 DIAGNOSIS — F17200 Nicotine dependence, unspecified, uncomplicated: Secondary | ICD-10-CM | POA: Insufficient documentation

## 2013-05-15 DIAGNOSIS — Z8679 Personal history of other diseases of the circulatory system: Secondary | ICD-10-CM | POA: Insufficient documentation

## 2013-05-15 HISTORY — DX: Homelessness unspecified: Z59.00

## 2013-05-15 HISTORY — DX: Homelessness: Z59.0

## 2013-05-15 NOTE — ED Notes (Signed)
Patient states that she has not followed up with [facility] or anyone else.   Patient states that she is tired of [place] not being able to diagnose.

## 2013-05-15 NOTE — ED Notes (Signed)
PT. REPORTS DIZZINESS / UNSTEADY GAIT ONSET 4 DAYS AGO , AMBULATORY ,RESPIRATIONS UNLABORED , ALERT AND ORIENTED , PT. ALSO REPORTS FELL 2 DAYS AGO WITH NO INJURY.

## 2013-05-15 NOTE — ED Provider Notes (Signed)
History    CSN: [id number] Arrival date & time 05/15/13  7829  First MD Initiated Contact with Patient 05/15/13 0543     Chief Complaint  Patient presents with  . Dizziness   (Consider location/radiation/quality/duration/timing/severity/associated sxs/prior Treatment) HPI This patient is a 42 yo woman who presents to the ED with complaints of dizziness. The patient says she feels off balance when she walks. Thus, she is walking with heavily weighted bags in each hand to try to keep her balance. Otherwise, she says that she drifts to the right or to the left.   This is the patient's 10th visit to this emergency department this year. She had a normal CT of the brain in May 2014, she was seen 6 days ago and at that time, had an unremarkable CBC, basic metabolic panel., urinalysis and negative pregnancy test.  Chart review shows that the patient has been previously diagnosed with psychosis, delusions, bipolar disorder, polysubstance abuse and that she is homeless.  She denies any recent alcohol or drug use. She says she is not taking any medications. She says that she was "released" from taking psychiatric medications in 2009 at [facility]. Past Medical History  Diagnosis Date  . Palpitations   . Psychosis   . BIPOLAR DISORDER   . Substance abuse   . Delusions   . Homelessness    Past Surgical History  Procedure Laterality Date  . Bunionectomy     Family History  Problem Relation Age of Onset  . Heart disease Mother     CHF   History  Substance Use Topics  . Smoking status: Current Every Day Smoker -- 5 years    Types: Cigarettes  . Smokeless tobacco: Not on file  . Alcohol Use: No   OB History   Grav Para Term Preterm Abortions TAB SAB Ect Mult Living                 Review of Systems Gen: no weight loss, fevers, chills, night sweats Eyes: no discharge or drainage, no occular pain or visual changes Nose: no epistaxis or rhinorrhea Mouth: no  dental pain, no sore throat Neck: no neck pain Lungs: no SOB, cough, wheezing CV: no chest pain, palpitations, dependent edema or orthopnea Abd: no abdominal pain, nausea, vomiting GU: no dysuria or gross hematuria MSK: no myalgias or arthralgias Neuro: As per history of present illness, otherwise negative Skin: no rash Psyche: negative.  Allergies  Strawberry  Home Medications   Current Outpatient Rx  Name  Route  Sig  Dispense  Refill  . ibuprofen (ADVIL,MOTRIN) 200 MG tablet   Oral   Take 200 mg by mouth every 6 (six) hours as needed for pain.         . nitroGLYCERIN (NITROSTAT) 0.4 MG SL tablet   Sublingual   Place 0.4 mg under the tongue every 5 (five) minutes as needed for chest pain.          BP 111/80  Pulse 67  Temp(Src) 98.1 F (36.7 C) (Oral)  Resp 18  SpO2 100%  LMP 02/24/2013 Physical Exam Gen: well developed and well nourished appearing Head: NCAT Eyes: PERL, EOM, no nystagmus Nose: no epistaixis or rhinorrhea Mouth/throat: mucosa is moist and pink Neck: supple, no stridor Lungs: CTA B, no wheezing, rhonchi or rales Heart-regular rate and rhythm, no murmur, extremities appear well perfused Abd: soft, notender, nondistended Back: no ttp, no cva ttp Skin: no rashese, wnl Neuro: CN ii-xii grossly intact,  no focal deficits, 5 over 5 motor strength all major muscle groups of both arms and legs, no dysmetria, the patient's gait was observed as she ambulated up and down the hallway of the AP 5 in the emergency department. She has a normal gait. Normal tandem walk. Psyche; incongruent affect,  calm and cooperative.   ED Course  Procedures (including critical care time)  No procedures were performed during this ED visit.  MDM  Patient with persistent vague complaints of dizziness. She has had extensive workup over the past 6 months for multiple medical complaints. I do not believe that any further workup is warranted on an emergent basis. I have  referred the patient to [facility] for further evaluation of her sensation of dizziness.  Brandt Loosen, MD 05/15/13 [phone]

## 2013-05-15 NOTE — ED Notes (Signed)
Patient is homeless and staying in hotels.   Patient states she gets kicked out of every place she goes, and she just wants to rest.

## 2013-05-17 NOTE — ED Provider Notes (Signed)
History    CSN: [id number] Arrival date & time 05/10/13  1749  First MD Initiated Contact with Patient 05/10/13 2113     Chief Complaint  Patient presents with  . Fever  . Dizziness   (Consider location/radiation/quality/duration/timing/severity/associated sxs/prior Treatment) HPI Pt evaluated multiple time for same complaint. Pt continue to have dizziness when ambulating, palpitation and blurred vision. She has had negative workup thus far including neg CT 5/14. She alos c/op mild generalized abd cramping and subjective fever. No N/V/D. No urianry symptoms. She is homeless with substance abuse history and and bipolar disorder.  Past Medical History  Diagnosis Date  . Palpitations   . Psychosis   . BIPOLAR DISORDER   . Substance abuse   . Delusions   . Homelessness    Past Surgical History  Procedure Laterality Date  . Bunionectomy     Family History  Problem Relation Age of Onset  . Heart disease Mother     CHF   History  Substance Use Topics  . Smoking status: Current Every Day Smoker -- 5 years    Types: Cigarettes  . Smokeless tobacco: Not on file  . Alcohol Use: No   OB History   Grav Para Term Preterm Abortions TAB SAB Ect Mult Living                 Review of Systems  Constitutional: Positive for fever. Negative for chills and fatigue.  HENT: Negative for neck pain.   Respiratory: Negative for shortness of breath.   Cardiovascular: Positive for palpitations. Negative for chest pain and leg swelling.  Gastrointestinal: Positive for abdominal pain. Negative for nausea, vomiting and diarrhea.  Genitourinary: Negative for dysuria, frequency, hematuria, flank pain, vaginal bleeding and vaginal discharge.  Musculoskeletal: Negative for myalgias and back pain.  Skin: Negative for rash and wound.  Neurological: Positive for dizziness and light-headedness. Negative for syncope, weakness, numbness and headaches.  All other systems reviewed and are  negative.    Allergies  Strawberry  Home Medications   Current Outpatient Rx  Name  Route  Sig  Dispense  Refill  . ibuprofen (ADVIL,MOTRIN) 200 MG tablet   Oral   Take 200 mg by mouth every 6 (six) hours as needed for pain.         . nitroGLYCERIN (NITROSTAT) 0.4 MG SL tablet   Sublingual   Place 0.4 mg under the tongue every 5 (five) minutes as needed for chest pain.          BP 99/68  Pulse 90  Temp(Src) 98.6 F (37 C) (Oral)  Resp 18  SpO2 99%  LMP 02/24/2013 Physical Exam  Nursing note and vitals reviewed. Constitutional: She is oriented to person, place, and time. She appears well-developed and well-nourished. No distress.  HENT:  Head: Normocephalic and atraumatic.  Mouth/Throat: Oropharynx is clear and moist.  Eyes: EOM are normal. Pupils are equal, round, and reactive to light.  No nystagmus  Neck: Normal range of motion. Neck supple.  Cardiovascular: Normal rate and regular rhythm.   Pulmonary/Chest: Effort normal and breath sounds normal. No respiratory distress. She has no wheezes. She has no rales.  Abdominal: Soft. Bowel sounds are normal. She exhibits no distension and no mass. There is no tenderness. There is no rebound and no guarding.  Musculoskeletal: Normal range of motion. She exhibits no edema and no tenderness.  Neurological: She is alert and oriented to person, place, and time.  5/5 motor in all ext, sensation intact,  bl finger to nose intact, ambulating in hallways without assistance, normal gait.   Skin: Skin is warm and dry. No rash noted. No erythema.  Psychiatric: She has a normal mood and affect. Her behavior is normal.    ED Course  Procedures (including critical care time) Labs Reviewed - No data to display No results found. 1. Headache   2. Lightheadedness     MDM  Unsure as to the cause of pt's symptoms. She has been appropriately screened for medical emergencies. She has been referred to a neurologist for further workup.  She has been given return precautions.   Loren Racer, MD 05/17/13 [phone]

## 2013-05-21 ENCOUNTER — Encounter (HOSPITAL_COMMUNITY): Payer: Self-pay | Admitting: *Deleted

## 2013-05-21 ENCOUNTER — Emergency Department (HOSPITAL_COMMUNITY)
Admission: EM | Admit: 2013-05-21 | Discharge: 2013-05-21 | Disposition: A | Discharge status: Home / Self Care | Payer: Medicaid Other | Attending: Emergency Medicine | Admitting: Emergency Medicine

## 2013-05-21 DIAGNOSIS — Z8659 Personal history of other mental and behavioral disorders: Secondary | ICD-10-CM | POA: Insufficient documentation

## 2013-05-21 DIAGNOSIS — Z8679 Personal history of other diseases of the circulatory system: Secondary | ICD-10-CM | POA: Insufficient documentation

## 2013-05-21 DIAGNOSIS — F1910 Other psychoactive substance abuse, uncomplicated: Secondary | ICD-10-CM | POA: Insufficient documentation

## 2013-05-21 DIAGNOSIS — R002 Palpitations: Secondary | ICD-10-CM | POA: Insufficient documentation

## 2013-05-21 DIAGNOSIS — Z3202 Encounter for pregnancy test, result negative: Secondary | ICD-10-CM | POA: Insufficient documentation

## 2013-05-21 DIAGNOSIS — R0602 Shortness of breath: Secondary | ICD-10-CM | POA: Insufficient documentation

## 2013-05-21 DIAGNOSIS — F17200 Nicotine dependence, unspecified, uncomplicated: Secondary | ICD-10-CM | POA: Insufficient documentation

## 2013-05-21 DIAGNOSIS — Z5900 Homelessness unspecified: Secondary | ICD-10-CM | POA: Insufficient documentation

## 2013-05-21 DIAGNOSIS — D573 Sickle-cell trait: Secondary | ICD-10-CM | POA: Insufficient documentation

## 2013-05-21 HISTORY — DX: Sickle-cell trait: D57.3

## 2013-05-21 HISTORY — DX: Cardiomegaly: I51.7

## 2013-05-21 LAB — RAPID URINE DRUG SCREEN, HOSP PERFORMED
Barbiturates: NOT DETECTED
Benzodiazepines: NOT DETECTED
Cocaine: NOT DETECTED
Opiates: NOT DETECTED

## 2013-05-21 LAB — URINALYSIS, ROUTINE W REFLEX MICROSCOPIC
Bilirubin Urine: NEGATIVE
Hgb urine dipstick: NEGATIVE
Nitrite: NEGATIVE
Specific Gravity, Urine: 1.017 (ref 1.005–1.030)
pH: 6 (ref 5.0–8.0)

## 2013-05-21 MED ORDER — LORAZEPAM 1 MG PO TABS
1.0000 mg | ORAL_TABLET | Freq: Once | ORAL | Status: AC
  Administered 2013-05-21: 1 mg via ORAL
  Filled 2013-05-21: qty 1

## 2013-05-21 MED ORDER — DIAZEPAM 2 MG PO TABS: 2.0000 mg | ORAL_TABLET | Freq: Four times a day (QID) | ORAL | Status: DC | PRN

## 2013-05-21 NOTE — ED Provider Notes (Signed)
History    CSN: [id number] Arrival date & time 05/21/13  1510  First MD Initiated Contact with Patient 05/21/13 1522     Chief Complaint  Patient presents with  . Palpitations  . Shortness of Breath   (Consider location/radiation/quality/duration/timing/severity/associated sxs/prior Treatment) HPI  Pt evaluated multiple time for same complaint.  She has had 10 visits to the ED in the last month. Pt continue to have dizziness when ambulating, palpitation and blurred vision. She has had negative workup thus far including neg CT 5/14.Patient is a poor historian and states that she is having significant difficulty following up with Neurology.  She also complains that she is extremely frustrated because her primary care physician has not been able to get access to her medical records although she signed a release.  The patient states that she knew she is overusing services at the emergency department and she feels bad about this.  The patient is currently homeless.  She states that she is looking for a job and has significant social stressors at this time.  Patient states that today she was filling out a job application when she suddenly had heart palpitations and felt short of breath.  I asked the patient if she has a history of anxiety she states she does not know what that feels like.  She states that she was given something for anxiety at one point and she did not have any of her symptoms while taking the medication.  She did not remember what this was.  Patient is concerned that she may be pregnant.  She has a history of psychosis, bipolar disorder and substance abuse however patient appears very lucid today.  Past Medical History  Diagnosis Date  . Palpitations   . Psychosis   . BIPOLAR DISORDER   . Substance abuse   . Delusions   . Homelessness   . Left ventricular hypertrophy   . Sickle cell trait    Past Surgical History  Procedure Laterality Date  . Bunionectomy     Family  History  Problem Relation Age of Onset  . Heart disease Mother     CHF   History  Substance Use Topics  . Smoking status: Current Every Day Smoker -- 5 years    Types: Cigarettes  . Smokeless tobacco: Not on file  . Alcohol Use: No   OB History   Grav Para Term Preterm Abortions TAB SAB Ect Mult Living                 Review of Systems Ten systems reviewed and are negative for acute change, except as noted in the HPI.   Allergies  Strawberry  Home Medications   Current Outpatient Rx  Name  Route  Sig  Dispense  Refill  . ibuprofen (ADVIL,MOTRIN) 200 MG tablet   Oral   Take 200 mg by mouth every 6 (six) hours as needed for pain.         . nitroGLYCERIN (NITROSTAT) 0.4 MG SL tablet   Sublingual   Place 0.4 mg under the tongue every 5 (five) minutes as needed for chest pain.          BP 120/75  Pulse 73  Temp(Src) 97.8 F (36.6 C) (Oral)  Resp 18  SpO2 95%  LMP 02/24/2013 Physical Exam Physical Exam  Nursing note and vitals reviewed. Constitutional: She is oriented to person, place, and time. She appears well-developed and well-nourished. No distress.  HENT:  Head: Normocephalic and atraumatic.  Eyes: Conjunctivae normal and EOM are normal. Pupils are equal, round, and reactive to light. No scleral icterus.  Neck: Normal range of motion.  Cardiovascular: Normal rate, regular rhythm and normal heart sounds.  Exam reveals no gallop and no friction rub.   No murmur heard. Pulmonary/Chest: Effort normal and breath sounds normal. No respiratory distress.  Abdominal: Soft. Bowel sounds are normal. She exhibits no distension and no mass. There is no tenderness. There is no guarding.  Neurological: She is alert and oriented to person, place, and time.  Skin: Skin is warm and dry. She is not diaphoretic.    ED Course  Procedures (including critical care time) Labs Reviewed  URINALYSIS, ROUTINE W REFLEX MICROSCOPIC  URINE RAPID DRUG SCREEN (HOSP PERFORMED)  CBC   BASIC METABOLIC PANEL  POCT PREGNANCY, URINE   No results found. No diagnosis found.  MDM  4:37 PM Patient here due to palitations. I have e very low suspicion for acs.   7:07 PM I was just informed that the patient refused labs. Labs were normal on the 9th. Her palpitations have resolved after ativan and she is feeling much better. Considering her extensive history and normal EKG I do not feel se need to investigate further. Patient states that she was just trying to get follow up.   Arthor Captain, PA-C 05/21/13 1913

## 2013-05-21 NOTE — ED Notes (Signed)
Pt is here with heart palpitations, sob, and dizziness and states unable to get appointment with follow up doctors

## 2013-05-21 NOTE — ED Provider Notes (Signed)
3:40 PM  Date: 05/21/2013  Rate: 75  Rhythm: normal sinus rhythm  QRS Axis: normal  Intervals: normal PQRS:  Left atrial enlargement.  ST/T Wave abnormalities: normal  Conduction Disutrbances:none  Narrative Interpretation: Borderline EKG  Old EKG Reviewed: none available    Carleene Cooper III, MD 05/21/13 1541

## 2013-05-22 NOTE — ED Provider Notes (Signed)
Medical screening examination/treatment/procedure(s) were performed by non-physician practitioner and as supervising physician I was immediately available for consultation/collaboration.   Carleene Cooper III, MD 05/22/13 1248

## 2013-05-23 DIAGNOSIS — F319 Bipolar disorder, unspecified: Secondary | ICD-10-CM | POA: Insufficient documentation

## 2013-05-23 DIAGNOSIS — Z5900 Homelessness unspecified: Secondary | ICD-10-CM | POA: Insufficient documentation

## 2013-05-23 DIAGNOSIS — F17200 Nicotine dependence, unspecified, uncomplicated: Secondary | ICD-10-CM | POA: Insufficient documentation

## 2013-05-23 DIAGNOSIS — Z760 Encounter for issue of repeat prescription: Secondary | ICD-10-CM | POA: Insufficient documentation

## 2013-05-23 DIAGNOSIS — Z79899 Other long term (current) drug therapy: Secondary | ICD-10-CM | POA: Insufficient documentation

## 2013-05-23 DIAGNOSIS — Z8679 Personal history of other diseases of the circulatory system: Secondary | ICD-10-CM | POA: Insufficient documentation

## 2013-05-23 DIAGNOSIS — F1910 Other psychoactive substance abuse, uncomplicated: Secondary | ICD-10-CM | POA: Insufficient documentation

## 2013-05-23 DIAGNOSIS — R002 Palpitations: Secondary | ICD-10-CM | POA: Insufficient documentation

## 2013-05-23 DIAGNOSIS — F29 Unspecified psychosis not due to a substance or known physiological condition: Secondary | ICD-10-CM | POA: Insufficient documentation

## 2013-05-23 DIAGNOSIS — Z862 Personal history of diseases of the blood and blood-forming organs and certain disorders involving the immune mechanism: Secondary | ICD-10-CM | POA: Insufficient documentation

## 2013-05-24 ENCOUNTER — Emergency Department (HOSPITAL_COMMUNITY)
Admission: EM | Admit: 2013-05-24 | Discharge: 2013-05-24 | Disposition: A | Discharge status: Home / Self Care | Payer: Medicaid Other | Attending: Emergency Medicine | Admitting: Emergency Medicine

## 2013-05-24 ENCOUNTER — Encounter (HOSPITAL_COMMUNITY): Payer: Self-pay | Admitting: Emergency Medicine

## 2013-05-24 DIAGNOSIS — Z760 Encounter for issue of repeat prescription: Secondary | ICD-10-CM

## 2013-05-24 MED ORDER — NITROGLYCERIN 0.4 MG SL SUBL: 0.4000 mg | SUBLINGUAL_TABLET | SUBLINGUAL | Status: DC | PRN

## 2013-05-24 MED ORDER — LORAZEPAM 1 MG PO TABS: 1.0000 mg | ORAL_TABLET | Freq: Four times a day (QID) | ORAL | Status: DC | PRN

## 2013-05-24 NOTE — ED Notes (Signed)
Pt left without signing her e-signature form. Pt refused to sign.

## 2013-05-24 NOTE — ED Notes (Signed)
PT. RETURNED AT TRIAGE REQUESTING MEDICATION REFILL , PT. STATED HER MEDICATION WAS STOLEN THIS EVENING .

## 2013-05-24 NOTE — ED Notes (Signed)
EMS REPORTED THAT PT. WAS PICK UP AT A LOCAL [facility] REQUESTING REFILL OF HER MEDICATIONS  ( STOLEN ) , PT. UNABLE TO LOCATE AT TRIAGE AREA/WAITING AREA . PT. LEFT BEFORE BEING TRIAGE BY NURSE.
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
## 2013-05-25 NOTE — ED Provider Notes (Signed)
Medical screening examination/treatment/procedure(s) were performed by non-physician practitioner and as supervising physician I was immediately available for consultation/collaboration.   Hanley Seamen, MD 05/25/13 [phone]

## 2013-05-29 ENCOUNTER — Encounter (HOSPITAL_COMMUNITY): Payer: Self-pay | Admitting: Emergency Medicine

## 2013-05-29 ENCOUNTER — Emergency Department (HOSPITAL_COMMUNITY)
Admission: EM | Admit: 2013-05-29 | Discharge: 2013-05-29 | Disposition: A | Discharge status: Home / Self Care | Payer: Medicaid Other | Attending: Emergency Medicine | Admitting: Emergency Medicine

## 2013-05-29 DIAGNOSIS — F419 Anxiety disorder, unspecified: Secondary | ICD-10-CM

## 2013-05-29 DIAGNOSIS — F17200 Nicotine dependence, unspecified, uncomplicated: Secondary | ICD-10-CM | POA: Insufficient documentation

## 2013-05-29 DIAGNOSIS — R002 Palpitations: Secondary | ICD-10-CM | POA: Insufficient documentation

## 2013-05-29 DIAGNOSIS — F411 Generalized anxiety disorder: Secondary | ICD-10-CM | POA: Insufficient documentation

## 2013-05-29 DIAGNOSIS — Z8679 Personal history of other diseases of the circulatory system: Secondary | ICD-10-CM | POA: Insufficient documentation

## 2013-05-29 DIAGNOSIS — Z862 Personal history of diseases of the blood and blood-forming organs and certain disorders involving the immune mechanism: Secondary | ICD-10-CM | POA: Insufficient documentation

## 2013-05-29 DIAGNOSIS — R0602 Shortness of breath: Secondary | ICD-10-CM | POA: Insufficient documentation

## 2013-05-29 DIAGNOSIS — Z79899 Other long term (current) drug therapy: Secondary | ICD-10-CM | POA: Insufficient documentation

## 2013-05-29 DIAGNOSIS — Z8659 Personal history of other mental and behavioral disorders: Secondary | ICD-10-CM | POA: Insufficient documentation
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
## 2013-05-29 NOTE — ED Notes (Signed)
Pt c/o of anxiety that started today. C/o of homelessness. Hx Bipolar. NAD at this time.

## 2013-05-29 NOTE — ED Notes (Signed)
[id number]<AV> Expected date:<BR> Expected time:<BR> Means of arrival:<BR> Comments:<BR> EMS-anxiety

## 2013-05-30 ENCOUNTER — Emergency Department (HOSPITAL_COMMUNITY)
Admission: EM | Admit: 2013-05-30 | Discharge: 2013-05-30 | Disposition: A | Discharge status: Home / Self Care | Payer: Medicaid Other | Attending: Emergency Medicine | Admitting: Emergency Medicine

## 2013-05-30 ENCOUNTER — Encounter (HOSPITAL_COMMUNITY): Payer: Self-pay | Admitting: Emergency Medicine

## 2013-05-30 DIAGNOSIS — H538 Other visual disturbances: Secondary | ICD-10-CM | POA: Insufficient documentation

## 2013-05-30 DIAGNOSIS — Z862 Personal history of diseases of the blood and blood-forming organs and certain disorders involving the immune mechanism: Secondary | ICD-10-CM | POA: Insufficient documentation

## 2013-05-30 DIAGNOSIS — F17200 Nicotine dependence, unspecified, uncomplicated: Secondary | ICD-10-CM | POA: Insufficient documentation

## 2013-05-30 DIAGNOSIS — F911 Conduct disorder, childhood-onset type: Secondary | ICD-10-CM | POA: Insufficient documentation

## 2013-05-30 DIAGNOSIS — R42 Dizziness and giddiness: Secondary | ICD-10-CM | POA: Insufficient documentation

## 2013-05-30 DIAGNOSIS — R4689 Other symptoms and signs involving appearance and behavior: Secondary | ICD-10-CM

## 2013-05-30 DIAGNOSIS — R110 Nausea: Secondary | ICD-10-CM | POA: Insufficient documentation

## 2013-05-30 DIAGNOSIS — Z8659 Personal history of other mental and behavioral disorders: Secondary | ICD-10-CM | POA: Insufficient documentation

## 2013-05-30 DIAGNOSIS — Z79899 Other long term (current) drug therapy: Secondary | ICD-10-CM | POA: Insufficient documentation

## 2013-05-30 DIAGNOSIS — Z8679 Personal history of other diseases of the circulatory system: Secondary | ICD-10-CM | POA: Insufficient documentation

## 2013-05-30 NOTE — ED Notes (Signed)
Pt did not answer x 2 

## 2013-05-30 NOTE — ED Notes (Signed)
Unable to locate pt x 4; nurse first reports seeing pt but unable to locate at this time

## 2013-05-30 NOTE — ED Notes (Signed)
Pt seen here multiple times in past for dizziness and blurry vision; pt seen here multiple times for same

## 2013-05-30 NOTE — ED Notes (Signed)
Pt did not answer x 3 

## 2013-05-30 NOTE — ED Provider Notes (Signed)
CSN: [id number]     Arrival date & time 05/30/13  1718 History     First MD Initiated Contact with Patient 05/30/13 2219     Chief Complaint  Patient presents with  . Dizziness   (Consider location/radiation/quality/duration/timing/severity/associated sxs/prior Treatment) HPI Anita Hill is a 42 y.o. female who presents to ED with complaint of dizziness. Pt states symptoms began "months ago." was seen here for the same thing numerous of times. States dizziness that is intermittent, blurred vision, nausea. States was referred to neurology, states called but was told she needed a referral from her PCP. Pt states she went to her PCP today who asked her for a copay, she was unable to pay, and per pt they told her "to come back to emergency room."  Pt states once when was in ER, she was given valium for her symptoms and states "it helped." Pt denies any head injuries. States feels like room is spinning. No n/v, no chest pain, no SOB, no other symptoms.    Past Medical History  Diagnosis Date  . Palpitations   . Psychosis   . BIPOLAR DISORDER   . Substance abuse   . Delusions   . Homelessness   . Left ventricular hypertrophy   . Sickle cell trait   . Homelessness    Past Surgical History  Procedure Laterality Date  . Bunionectomy     Family History  Problem Relation Age of Onset  . Heart disease Mother     CHF   History  Substance Use Topics  . Smoking status: Current Every Day Smoker -- 5 years    Types: Cigarettes  . Smokeless tobacco: Not on file  . Alcohol Use: No   OB History   Grav Para Term Preterm Abortions TAB SAB Ect Mult Living                 Review of Systems  Constitutional: Negative for fever and chills.  HENT: Negative for neck pain and neck stiffness.   Respiratory: Negative.   Cardiovascular: Negative.   Neurological: Positive for dizziness and light-headedness. Negative for weakness and headaches.    Allergies  Strawberry  Home Medications    Current Outpatient Rx  Name  Route  Sig  Dispense  Refill  . cephALEXin (KEFLEX) 500 MG capsule   Oral   Take 500 mg by mouth 4 (four) times daily. For 10 days; Start date 05/27/13         . LORazepam (ATIVAN) 1 MG tablet   Oral   Take 1 tablet (1 mg total) by mouth every 6 (six) hours as needed for anxiety.   5 tablet   0   . nitroGLYCERIN (NITROSTAT) 0.4 MG SL tablet   Sublingual   Place 1 tablet (0.4 mg total) under the tongue every 5 (five) minutes as needed for chest pain.   30 tablet   0    BP 114/84  Pulse 88  Temp(Src) 98.7 F (37.1 C) (Oral)  Resp 16  SpO2 99%  LMP 02/24/2013 Physical Exam  Nursing note and vitals reviewed. Constitutional: She is oriented to person, place, and time. She appears well-developed and well-nourished. No distress.  HENT:  Head: Normocephalic.  Eyes: Conjunctivae are normal.  Neck: Neck supple.  Cardiovascular: Normal rate, regular rhythm and normal heart sounds.   Pulmonary/Chest: Effort normal and breath sounds normal. No respiratory distress. She has no wheezes. She has no rales.  Musculoskeletal: She exhibits no edema.  Neurological: She  is alert and oriented to person, place, and time.  Skin: Skin is warm and dry.    ED Course   Procedures (including critical care time)  Labs Reviewed - No data to display No results found. 1. Dizziness   2. Aggressive behavior     MDM  Pt seen here numerous of time with same complaint. When I kept asking pt why she is back here, her story changed from originally telling me her doctor told her to come here to " they wont give me any more of the valiums." Pt also stated she went to [facility] and was not treated there as she wanted to be either. Pt became angry with me asking her "too many questions." she got off the bed with no signs of being dizzy and asked for her discharge papers stating "if I am not going to get anything, i want to be discharged." Pt walked to the bathroom and back with  no difficulty. Pt will be discharged home.   Filed Vitals:   05/30/13 1803  BP: 114/84  Pulse: 88  Temp: 98.7 F (37.1 C)  TempSrc: Oral  Resp: 16  SpO2: 99%     Anita Jacobson [name], PA-C 05/31/13 0016

## 2013-05-30 NOTE — ED Notes (Signed)
Pt did not answer x 1 

## 2013-05-31 NOTE — ED Provider Notes (Signed)
Medical screening examination/treatment/procedure(s) were performed by non-physician practitioner and as supervising physician I was immediately available for consultation/collaboration.   Loren Racer, MD 05/31/13 1754

## 2013-06-08 ENCOUNTER — Emergency Department (HOSPITAL_COMMUNITY)
Admission: EM | Admit: 2013-06-08 | Discharge: 2013-06-08 | Discharge status: Against Medical Advice | Payer: Medicaid Other | Attending: Emergency Medicine | Admitting: Emergency Medicine

## 2013-06-08 ENCOUNTER — Encounter (HOSPITAL_COMMUNITY): Payer: Self-pay | Admitting: Emergency Medicine

## 2013-06-08 DIAGNOSIS — F17200 Nicotine dependence, unspecified, uncomplicated: Secondary | ICD-10-CM | POA: Insufficient documentation

## 2013-06-08 DIAGNOSIS — Z760 Encounter for issue of repeat prescription: Secondary | ICD-10-CM | POA: Insufficient documentation

## 2013-06-08 DIAGNOSIS — R42 Dizziness and giddiness: Secondary | ICD-10-CM | POA: Insufficient documentation

## 2013-06-08 NOTE — ED Notes (Signed)
PT. REQUESTING PRESCRIPTION FOR HER ATIVAN / NTG . PT. STATED OCCASIONAL DIZZINESS .

## 2013-06-17 ENCOUNTER — Emergency Department (HOSPITAL_COMMUNITY): Payer: Medicaid Other

## 2013-06-17 ENCOUNTER — Emergency Department (HOSPITAL_COMMUNITY)
Admission: EM | Admit: 2013-06-17 | Discharge: 2013-06-17 | Disposition: A | Discharge status: Home / Self Care | Payer: Medicaid Other | Attending: Emergency Medicine | Admitting: Emergency Medicine

## 2013-06-17 ENCOUNTER — Encounter (HOSPITAL_COMMUNITY): Payer: Self-pay | Admitting: *Deleted

## 2013-06-17 DIAGNOSIS — Z8679 Personal history of other diseases of the circulatory system: Secondary | ICD-10-CM | POA: Insufficient documentation

## 2013-06-17 DIAGNOSIS — Z862 Personal history of diseases of the blood and blood-forming organs and certain disorders involving the immune mechanism: Secondary | ICD-10-CM | POA: Insufficient documentation

## 2013-06-17 DIAGNOSIS — Z79899 Other long term (current) drug therapy: Secondary | ICD-10-CM | POA: Insufficient documentation

## 2013-06-17 DIAGNOSIS — R609 Edema, unspecified: Secondary | ICD-10-CM

## 2013-06-17 DIAGNOSIS — Z8659 Personal history of other mental and behavioral disorders: Secondary | ICD-10-CM | POA: Insufficient documentation

## 2013-06-17 DIAGNOSIS — F17200 Nicotine dependence, unspecified, uncomplicated: Secondary | ICD-10-CM | POA: Insufficient documentation

## 2013-06-17 LAB — COMPREHENSIVE METABOLIC PANEL WITH GFR
ALT: 14 U/L (ref 0–35)
AST: 16 U/L (ref 0–37)
Albumin: 3.3 g/dL — ABNORMAL LOW (ref 3.5–5.2)
Alkaline Phosphatase: 43 U/L (ref 39–117)
BUN: 11 mg/dL (ref 6–23)
CO2: 28 meq/L (ref 19–32)
Calcium: 9.1 mg/dL (ref 8.4–10.5)
Chloride: 102 meq/L (ref 96–112)
Creatinine, Ser: 0.64 mg/dL (ref 0.50–1.10)
GFR calc Af Amer: >90 mL/min (ref >90)
GFR calc non Af Amer: >90 mL/min (ref >90)
Glucose, Bld: 90 mg/dL (ref 70–99)
Potassium: 3.5 meq/L (ref 3.5–5.1)
Sodium: 138 meq/L (ref 135–145)
Total Bilirubin: 0.3 mg/dL (ref 0.3–1.2)
Total Protein: 5.7 g/dL — ABNORMAL LOW (ref 6.0–8.3)

## 2013-06-17 LAB — CBC WITH DIFFERENTIAL/PLATELET
Basophils Relative: 0 % (ref 0–1)
Eosinophils Absolute: 0.2 10*3/uL (ref 0.0–0.7)
Eosinophils Relative: 4 % (ref 0–5)
Hemoglobin: 12.4 g/dL (ref 12.0–15.0)
Lymphs Abs: 2.7 10*3/uL (ref 0.7–4.0)
MCH: 28.6 pg (ref 26.0–34.0)
MCHC: 34.3 g/dL (ref 30.0–36.0)
MCV: 83.4 fL (ref 78.0–100.0)
Monocytes Absolute: 0.4 10*3/uL (ref 0.1–1.0)
Monocytes Relative: 7 % (ref 3–12)
Neutrophils Relative %: 42 % — ABNORMAL LOW (ref 43–77)

## 2013-06-17 MED ORDER — FUROSEMIDE 40 MG PO TABS: 40.0000 mg | ORAL_TABLET | Freq: Every day | ORAL | Status: DC

## 2013-06-17 MED ORDER — FUROSEMIDE 10 MG/ML IJ SOLN
40.0000 mg | Freq: Once | INTRAMUSCULAR | Status: AC
  Administered 2013-06-17: 40 mg via INTRAVENOUS
  Filled 2013-06-17: qty 4

## 2013-06-17 NOTE — ED Provider Notes (Signed)
CSN: [id number]     Arrival date & time 06/17/13  0118 History     First MD Initiated Contact with Patient 06/17/13 0405     Chief Complaint  Patient presents with  . Foot Swelling   (Consider location/radiation/quality/duration/timing/severity/associated sxs/prior Treatment) The history is provided by the patient.   42 year old female has some leg swelling which she states was diagnosis edema about 2 months ago. She was document any medication for it she states the swelling has gotten worse over the last 2 days. It is now to the point where she cannot put on her shoes. She denies any chest pain or dyspnea. She denies fever, chills, sweats. Nothing makes symptoms better and nothing makes it worse.  Past Medical History  Diagnosis Date  . Palpitations   . Psychosis   . BIPOLAR DISORDER   . Substance abuse   . Delusions   . Homelessness   . Left ventricular hypertrophy   . Sickle cell trait   . Homelessness    Past Surgical History  Procedure Laterality Date  . Bunionectomy     Family History  Problem Relation Age of Onset  . Heart disease Mother     CHF   History  Substance Use Topics  . Smoking status: Current Every Day Smoker -- 5 years    Types: Cigarettes  . Smokeless tobacco: Not on file  . Alcohol Use: No   OB History   Grav Para Term Preterm Abortions TAB SAB Ect Mult Living                 Review of Systems  All other systems reviewed and are negative.    Allergies  Strawberry  Home Medications   Current Outpatient Rx  Name  Route  Sig  Dispense  Refill  . cephALEXin (KEFLEX) 500 MG capsule   Oral   Take 500 mg by mouth 4 (four) times daily. For 10 days; Start date 05/27/13         . LORazepam (ATIVAN) 1 MG tablet   Oral   Take 1 tablet (1 mg total) by mouth every 6 (six) hours as needed for anxiety.   5 tablet   0   . nitroGLYCERIN (NITROSTAT) 0.4 MG SL tablet   Sublingual   Place 1 tablet (0.4 mg total) under the tongue every 5 (five)  minutes as needed for chest pain.   30 tablet   0    BP 112/75  Pulse 81  Temp(Src) 98.4 F (36.9 C) (Oral)  Resp 18  SpO2 99% Physical Exam  Nursing note and vitals reviewed.  42 year old female, resting comfortably and in no acute distress. Vital signs are normal. Oxygen saturation is 99%, which is normal. Head is normocephalic and atraumatic. PERRLA, EOMI. Oropharynx is clear. Neck is nontender and supple without adenopathy or JVD. Back is nontender and there is no CVA tenderness. Lungs are clear without rales, wheezes, or rhonchi. Chest is nontender. Heart has regular rate and rhythm without murmur. Abdomen is soft, flat, nontender without masses or hepatosplenomegaly and peristalsis is normoactive. Extremities have 2+ edema with trace presacral edema, full range of motion is present. Skin is warm and dry without rash. Neurologic: Mental status is normal, cranial nerves are intact, there are no motor or sensory deficits.  ED Course   Procedures (including critical care time)  Results for orders placed during the hospital encounter of 06/17/13  CBC WITH DIFFERENTIAL      Result Value Range  WBC 5.7  4.0 - 10.5 K/uL   RBC 4.33  3.87 - 5.11 MIL/uL   Hemoglobin 12.4  12.0 - 15.0 g/dL   HCT 96.2  95.2 - 84.1 %   MCV 83.4  78.0 - 100.0 fL   MCH 28.6  26.0 - 34.0 pg   MCHC 34.3  30.0 - 36.0 g/dL   RDW 32.4  40.1 - 02.7 %   Platelets 206  150 - 400 K/uL   Neutrophils Relative % 42 (*) 43 - 77 %   Neutro Abs 2.4  1.7 - 7.7 K/uL   Lymphocytes Relative 46  12 - 46 %   Lymphs Abs 2.7  0.7 - 4.0 K/uL   Monocytes Relative 7  3 - 12 %   Monocytes Absolute 0.4  0.1 - 1.0 K/uL   Eosinophils Relative 4  0 - 5 %   Eosinophils Absolute 0.2  0.0 - 0.7 K/uL   Basophils Relative 0  0 - 1 %   Basophils Absolute 0.0  0.0 - 0.1 K/uL  COMPREHENSIVE METABOLIC PANEL      Result Value Range   Sodium 138  135 - 145 mEq/L   Potassium 3.5  3.5 - 5.1 mEq/L   Chloride 102  96 - 112 mEq/L    CO2 28  19 - 32 mEq/L   Glucose, Bld 90  70 - 99 mg/dL   BUN 11  6 - 23 mg/dL   Creatinine, Ser 2.53  0.50 - 1.10 mg/dL   Calcium 9.1  8.4 - 66.4 mg/dL   Total Protein 5.7 (*) 6.0 - 8.3 g/dL   Albumin 3.3 (*) 3.5 - 5.2 g/dL   AST 16  0 - 37 U/L   ALT 14  0 - 35 U/L   Alkaline Phosphatase 43  39 - 117 U/L   Total Bilirubin 0.3  0.3 - 1.2 mg/dL   GFR calc non Af Amer >90  >90 mL/min   GFR calc Af Amer >90  >90 mL/min   Dg Chest 2 View  06/17/2013   *RADIOLOGY REPORT*  Clinical Data: Foot swelling  CHEST - 2 VIEW  Comparison: Prior radiograph from 05/09/2013  Findings: Cardiac and mediastinal silhouettes are stable size and contour, and remain within normal limits.  The lungs are normally inflated.  No airspace consolidation, pleural effusion, or pulmonary edema is identified.  There is no pneumothorax.  Bony thorax is intact.  IMPRESSION: Stable exam with no acute cardiopulmonary process.   Original Report Authenticated By: Rise Mu, M.D.    1. Peripheral edema     MDM  Peripheral edema of uncertain cause. Old records are reviewed and she has several recent ED visits with no mention of edema. Prior labs have shown normal serum albumin, no proteinuria. Chest x-ray will be checked today as well as some metabolic panel and urinalysis. She'll be given a dose of furosemide.  Workup is unremarkable. She had good diuresis with furosemide and is discharged with a prescription for furosemide.  Dione Booze, MD 06/17/13 [phone]

## 2013-06-17 NOTE — ED Notes (Signed)
The pt is c/i bi-lateral feet swelling that she has had for awhile intermittently.  For the past 2 days her feet have swelled more than usual
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
## 2013-06-17 NOTE — ED Notes (Signed)
No answer

## 2013-07-08 ENCOUNTER — Emergency Department (HOSPITAL_COMMUNITY)
Admission: EM | Admit: 2013-07-08 | Discharge: 2013-07-08 | Discharge status: Against Medical Advice | Payer: Medicaid Other | Attending: Emergency Medicine | Admitting: Emergency Medicine

## 2013-07-08 ENCOUNTER — Encounter (HOSPITAL_COMMUNITY): Payer: Self-pay

## 2013-07-08 DIAGNOSIS — F17200 Nicotine dependence, unspecified, uncomplicated: Secondary | ICD-10-CM | POA: Insufficient documentation

## 2013-07-08 DIAGNOSIS — R42 Dizziness and giddiness: Secondary | ICD-10-CM | POA: Insufficient documentation

## 2013-07-08 NOTE — ED Notes (Signed)
Pt reports she was sitting outside the ED entrance when she had a sudden onset of dizziness approx 5 mins ago. Pt denies chest/back/abd pain, N/V/D. Pt a/o x4, no facial droop or arm drift noted. Pt states "I;m here all the time, you can pull my stuff up."

## 2013-07-15 ENCOUNTER — Encounter (HOSPITAL_COMMUNITY): Payer: Self-pay

## 2013-07-15 ENCOUNTER — Emergency Department (HOSPITAL_COMMUNITY)
Admission: EM | Admit: 2013-07-15 | Discharge: 2013-07-15 | Disposition: A | Discharge status: Home / Self Care | Payer: Medicaid Other | Attending: Emergency Medicine | Admitting: Emergency Medicine

## 2013-07-15 ENCOUNTER — Emergency Department (HOSPITAL_COMMUNITY): Payer: Medicaid Other

## 2013-07-15 DIAGNOSIS — Z79899 Other long term (current) drug therapy: Secondary | ICD-10-CM | POA: Insufficient documentation

## 2013-07-15 DIAGNOSIS — F1910 Other psychoactive substance abuse, uncomplicated: Secondary | ICD-10-CM | POA: Insufficient documentation

## 2013-07-15 DIAGNOSIS — D573 Sickle-cell trait: Secondary | ICD-10-CM | POA: Insufficient documentation

## 2013-07-15 DIAGNOSIS — I517 Cardiomegaly: Secondary | ICD-10-CM | POA: Insufficient documentation

## 2013-07-15 DIAGNOSIS — Z5900 Homelessness unspecified: Secondary | ICD-10-CM | POA: Insufficient documentation

## 2013-07-15 DIAGNOSIS — R079 Chest pain, unspecified: Secondary | ICD-10-CM | POA: Insufficient documentation

## 2013-07-15 DIAGNOSIS — F17200 Nicotine dependence, unspecified, uncomplicated: Secondary | ICD-10-CM | POA: Insufficient documentation

## 2013-07-15 DIAGNOSIS — R110 Nausea: Secondary | ICD-10-CM | POA: Insufficient documentation

## 2013-07-15 DIAGNOSIS — F319 Bipolar disorder, unspecified: Secondary | ICD-10-CM | POA: Insufficient documentation

## 2013-07-15 DIAGNOSIS — R0602 Shortness of breath: Secondary | ICD-10-CM | POA: Insufficient documentation

## 2013-07-15 DIAGNOSIS — F29 Unspecified psychosis not due to a substance or known physiological condition: Secondary | ICD-10-CM | POA: Insufficient documentation

## 2013-07-15 LAB — CBC
Hemoglobin: 12.6 g/dL (ref 12.0–15.0)
MCH: 28.8 pg (ref 26.0–34.0)
MCV: 84.2 fL (ref 78.0–100.0)
Platelets: 196 10*3/uL (ref 150–400)
RBC: 4.38 MIL/uL (ref 3.87–5.11)

## 2013-07-15 MED ORDER — OMEPRAZOLE 20 MG PO CPDR: 20.0000 mg | DELAYED_RELEASE_CAPSULE | Freq: Every day | ORAL | Status: DC

## 2013-07-15 MED ORDER — RANITIDINE HCL 150 MG PO CAPS: 150.0000 mg | ORAL_CAPSULE | Freq: Every day | ORAL | Status: DC

## 2013-07-15 MED ORDER — GI COCKTAIL ~~LOC~~
30.0000 mL | Freq: Once | ORAL | Status: AC
  Administered 2013-07-15: 30 mL via ORAL
  Filled 2013-07-15: qty 30
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
## 2013-07-15 NOTE — ED Notes (Addendum)
PER EMS: pt was here earlier but left prior to being checked into this ED. Called EMS at the gas station on [address] stating "i have burning in my chest and going into my leg." EMS reports the patient demanded to go to [facility] but due to chest burning was brought here. pt reports "I have a mitral valve."  BP-118/78, HR-78. Denies N/V/D, SOB.

## 2013-07-15 NOTE — ED Notes (Signed)
Pt states her toes on right foot are feeling "tingly" and has been happening often, occasionally with both feet. Sts she has always forgotten to mention it when she comes here to be seen.

## 2013-07-15 NOTE — ED Provider Notes (Signed)
Medical screening examination/treatment/procedure(s) were performed by non-physician practitioner and as supervising physician I was immediately available for consultation/collaboration.   Darlys Gales, MD 07/15/13 [phone]

## 2014-03-28 ENCOUNTER — Encounter (HOSPITAL_COMMUNITY): Payer: Self-pay | Admitting: Emergency Medicine

## 2014-03-28 ENCOUNTER — Emergency Department (HOSPITAL_COMMUNITY)
Admission: EM | Admit: 2014-03-28 | Discharge: 2014-03-28 | Discharge status: Against Medical Advice | Payer: Medicaid Other | Attending: Emergency Medicine | Admitting: Emergency Medicine

## 2014-03-28 DIAGNOSIS — IMO0002 Reserved for concepts with insufficient information to code with codable children: Secondary | ICD-10-CM | POA: Insufficient documentation

## 2014-03-28 LAB — URINALYSIS, ROUTINE W REFLEX MICROSCOPIC
BILIRUBIN URINE: NEGATIVE
Glucose, UA: NEGATIVE mg/dL
Hgb urine dipstick: NEGATIVE
KETONES UR: NEGATIVE mg/dL
NITRITE: NEGATIVE
PH: 6 (ref 5.0–8.0)
PROTEIN: NEGATIVE mg/dL
Specific Gravity, Urine: 1.014 (ref 1.005–1.030)
UROBILINOGEN UA: 0.2 mg/dL (ref 0.0–1.0)

## 2014-03-28 LAB — URINE MICROSCOPIC-ADD ON

## 2014-03-28 LAB — POC URINE PREG, ED: Preg Test, Ur: NEGATIVE

## 2014-03-28 NOTE — ED Notes (Signed)
Unable to locate for triage re-assessment x3

## 2014-03-28 NOTE — ED Notes (Addendum)
No answer in w/r, unable to find.  

## 2014-03-28 NOTE — ED Notes (Signed)
No answer in w/r, unable to find.
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
## 2014-04-14 ENCOUNTER — Emergency Department (HOSPITAL_COMMUNITY)
Admission: EM | Admit: 2014-04-14 | Discharge: 2014-04-14 | Disposition: A | Discharge status: Home / Self Care | Payer: Medicaid Other | Attending: Emergency Medicine | Admitting: Emergency Medicine

## 2014-04-14 ENCOUNTER — Encounter (HOSPITAL_COMMUNITY): Payer: Self-pay | Admitting: Emergency Medicine

## 2014-04-14 DIAGNOSIS — D573 Sickle-cell trait: Secondary | ICD-10-CM | POA: Insufficient documentation

## 2014-04-14 DIAGNOSIS — Z5900 Homelessness unspecified: Secondary | ICD-10-CM | POA: Insufficient documentation

## 2014-04-14 DIAGNOSIS — Z79899 Other long term (current) drug therapy: Secondary | ICD-10-CM | POA: Insufficient documentation

## 2014-04-14 DIAGNOSIS — G4762 Sleep related leg cramps: Secondary | ICD-10-CM

## 2014-04-14 DIAGNOSIS — R252 Cramp and spasm: Secondary | ICD-10-CM | POA: Insufficient documentation

## 2014-04-14 DIAGNOSIS — I517 Cardiomegaly: Secondary | ICD-10-CM | POA: Insufficient documentation

## 2014-04-14 DIAGNOSIS — Z8659 Personal history of other mental and behavioral disorders: Secondary | ICD-10-CM | POA: Insufficient documentation

## 2014-04-14 DIAGNOSIS — F17200 Nicotine dependence, unspecified, uncomplicated: Secondary | ICD-10-CM | POA: Insufficient documentation

## 2014-04-14 LAB — I-STAT CHEM 8, ED
BUN: 10 mg/dL (ref 6–23)
CHLORIDE: 99 meq/L (ref 96–112)
CREATININE: 0.7 mg/dL (ref 0.50–1.10)
Calcium, Ion: 1.2 mmol/L (ref 1.12–1.23)
GLUCOSE: 83 mg/dL (ref 70–99)
HCT: 40 % (ref 36.0–46.0)
HEMOGLOBIN: 13.6 g/dL (ref 12.0–15.0)
POTASSIUM: 3.4 meq/L — AB (ref 3.7–5.3)
SODIUM: 141 meq/L (ref 137–147)
TCO2: 26 mmol/L (ref 0–100)

## 2014-04-14 MED ORDER — POTASSIUM CHLORIDE CRYS ER 20 MEQ PO TBCR
40.0000 meq | EXTENDED_RELEASE_TABLET | Freq: Once | ORAL | Status: AC
  Administered 2014-04-14: 40 meq via ORAL
  Filled 2014-04-14: qty 2

## 2014-04-14 NOTE — ED Notes (Signed)
MD Otter at bedside. 

## 2014-04-14 NOTE — ED Notes (Signed)
Patient arrives with c/o right leg pain that has been present for about a month. Denies injury, states she thinks pain may be related to heart hx. States she had palpations all week. NAD, VS stable, asymptomatic. States leg pain is generalized in right leg and radiates up into groin

## 2014-04-14 NOTE — Discharge Instructions (Signed)
Leg Cramps Leg cramps that occur during exercise can be caused by poor circulation or dehydration. However, muscle cramps that occur at rest or during the night are usually not due to any serious medical problem. Heat cramps may cause muscle spasms during hot weather.  CAUSES There is no clear cause for muscle cramps. However, dehydration may be a factor for those who do not drink enough fluids and those who exercise in the heat. Imbalances in the level of sodium, potassium, calcium or magnesium in the muscle tissue may also be a factor. Some medications, such as water pills (diuretics), may cause loss of chemicals that the body needs (like sodium and potassium) and cause muscle cramps. TREATMENT   Make sure your diet has enough fluids and essential minerals for the muscle to work normally.  Avoid strenuous exercise for several days if you have been having frequent leg cramps.  Stretch and massage the cramped muscle for several minutes.  Some medicines may be helpful in some patients with night cramps. Only take over-the-counter or prescription medicines as directed by your caregiver. SEEK IMMEDIATE MEDICAL CARE IF:   Your leg cramps become worse.  Your foot becomes cold, numb, or blue. Document Released: 11/25/2004 Document Revised: 01/10/2012 Document Reviewed: 11/12/2008 [facility] Patient Information 2014 [place].  Emergency Department Resource Guide 1) Find a Doctor and Pay Out of Pocket Although you won't have to find out who is covered by your insurance plan, it is a good idea to ask around and get recommendations. You will then need to call the office and see if the doctor you have chosen will accept you as a new patient and what types of options they offer for patients who are self-pay. Some doctors offer discounts or will set up payment plans for their patients who do not have insurance, but you will need to ask so you aren't surprised when you get to your appointment.  2)  Contact Your Local Health Department Not all health departments have doctors that can see patients for sick visits, but many do, so it is worth a call to see if yours does. If you don't know where your local health department is, you can check in your phone book. The CDC also has a tool to help you locate your state's health department, and many state websites also have listings of all of their local health departments.  3) Find a [facility] If your illness is not likely to be very severe or complicated, you may want to try a walk in clinic. These are popping up all over the country in pharmacies, drugstores, and shopping centers. They're usually staffed by nurse practitioners or physician assistants that have been trained to treat common illnesses and complaints. They're usually fairly quick and inexpensive. However, if you have serious medical issues or chronic medical problems, these are probably not your best option.  No Primary Care Doctor: - Call Health Connect at  [phone] - they can help you locate a primary care doctor that  accepts your insurance, provides certain services, etc. - Physician Referral Service- [phone]  Chronic Pain Problems: Organization         Address  Phone   Notes  [facility]  [phone] Patients need to be referred by their primary care doctor.   Medication Assistance: Organization         Address  Phone   Notes  [facility] Medication Assistance Program [place] [place], [place]  [address] [phone] --Must be a resident of [facility] -- Must have NO insurance coverage whatsoever (no Medicaid/ Medicare, etc.) -- The pt. MUST have a primary care doctor that directs their care regularly and follows them in the community   Med[facility]ist  [phone]   [employer]  [phone]    Agencies that provide inexpensive medical care: Organization         Address  Phone   Notes  [place]   [phone]   [facility]    [phone]   [facility] [place], [place] [address] [phone]   [place] [address] [place] [phone]   Planned Parenthood    [phone]   [facility]    [phone]   [place] and [address], [place] Phone:  [phone], Fax:  [phone] Hours of Operation:  9 am - 6 pm, M-F.  Also accepts Medicaid/Medicare and self-pay.  [facility] for [place], [address], [place] Phone: [phone], Fax: [phone]. Hours of Operation:  8:30 am - 5:30 pm, M-F.  Also accepts Medicaid and self-pay.  [facility] [place] [address] [place] Phone: [phone]   [place], [place], [place] [phone], Ext. [phone] Mondays & Thursdays: 7-9 AM.  First 15 patients are seen on a first come, first serve basis.    [place] Providers:  Organization         Address  Phone   Notes  [facility] [address], [place] [phone] Also accepts self-pay patients.  [facility] [address], [place]  [phone]   [place], [address], [place] [phone]   [facility] [address], [place] [phone]   Lucianne Lei [address], [place]   [phone] Only accepts [place] Access [place] patients after they have their name applied to their card.   Self-Pay (no insurance) in [facility]:  Organization         Address  Phone   Notes  Sickle Cell Patients, [facility] [place] [phone]   [facility] [place] [phone]   [facility] [place]  [place], [place], [place] [phone]   [facility]/Dr. Osei-Bonsu  [address], [place] or [address], [place] [phone] Phone number for both [place] and [place] locations is the same.  Urgent Medical and [facility] [address], [place] [phone]   [facility] [address] [place] or [address] [phone]   [facility] [address], [place] [phone], phone; [phone], fax Sees patients 1st and 3rd Saturday of every month.  Must not qualify for public or private insurance (i.e. Medicaid, Medicare, [place] Health Choice, Veterans' Benefits)  Household income should be no more than 200% of the poverty level The clinic cannot treat you if you are pregnant or think you are pregnant  Sexually transmitted diseases are not treated at the clinic.    Dental Care: Organization  Address  Phone  Notes  [facility] [place], [place] [phone] Accepts children up to age 50 who are enrolled in [place] or [place]; pregnant women with a Medicaid card; and children who have applied for Medicaid or [place] Health Choice, but were declined, whose parents can pay a reduced fee at time of service.  [facility] Department of [facility]  [address], [place] [phone] Accepts children up to age 1 who are enrolled in [place] or [place]; pregnant women with a Medicaid card; and children who have applied for Medicaid or [place] Health Choice, but were declined, whose parents can pay a reduced fee at time of service.  [place] Adult Dental Access PROGRAM  [place] [phone] Patients are seen by appointment only. Walk-ins are not accepted. [place] will see patients 30 years of age and older. Monday - Tuesday  (8am-5pm) Most Wednesdays (8:30-5pm) $30 per visit, cash only  [facility] Adult Dental Access PROGRAM  [address], [facility] [phone] Patients are seen by appointment only. Walk-ins are not accepted. [place] will see patients 35 years of age and older. One Wednesday Evening (Monthly: Volunteer Based).  $30 per visit, cash only  [place]  [phone] for adults; Children under age 90, call Graduate Pediatric Dentistry at [phone]. Children aged 33-14, please call [phone] to request a pediatric application.  Dental services are provided in all areas of dental care including fillings, crowns and bridges, complete and partial dentures, implants, gum treatment, root canals, and extractions. Preventive care is also provided. Treatment is provided to both adults and children. Patients are selected via a lottery and there is often a waiting list.   [facility] [address], [place]  [phone] [email or web address]   [facility] [address] [place], [place] [phone], Ext. [phone] Second and Fourth Thursday of each month, opens at 6:30 AM; Clinic ends at 9 AM.  Patients are seen on a first-come first-served basis, and a limited number are seen during each clinic.   [facility]  [address] [place], [place] [phone]   Eligibility Requirements You must have lived in [place], [place], or [place] counties for at least the last three months.   You cannot be eligible for state or federal sponsored [employer], including [employer], [place], or [employer].   You generally cannot be eligible for healthcare insurance through your employer.    How to apply: Eligibility screenings are held every Tuesday and Wednesday afternoon from 1:00 pm until 4:00 pm. You do not need an appointment for the interview!  [facility] [address], [place], [place]   [place]  [place] Department  [place]  [phone]    Behavioral Health Resources in the Community: Intensive Outpatient Programs Organization         Address  Phone  Notes  [place]. [address], [place], [place] [phone]   [facility] [address] [place], [place]   ADS: Alcohol & Drug Svcs [address], [place], [place]   [place] [address]  [place], [place] or [phone]   Substance Abuse Resources  Organization         Address  Phone  Notes  Alcohol and Drug Services  [place]  [phone]   The [place]  [phone]   [place]inita Pester  [phone]   Residential & Outpatient Substance Abuse Program  [phone]   Psychological Services Organization         Address  Phone  Notes  [facility] [place]  [place]  [phone]   [place] [address], [place] or [phone]    Mobile Crisis Teams Organization         Address  Phone  Notes  [facility], Mobile Crisis Care Unit  [phone]   [facility]ive Psychotherapeutic Services  [address] [place], [place]   [place] [address] [place] [phone]    Self-Help/Support Groups Organization         Address  Phone             Notes  [place]. of [place] - variety of support groups  [place] Call for more information  Narcotics Anonymous (NA), [facility] [address], [employer] [place]  2 meetings at this location   Special educational needs teacher         Address  Phone  Notes  ASAP Residential Treatment [place],    [place]  1-([phone]   [facility]  [address], [place] [address]23536, [place], [place]   [place], [place] [phone] Admissions: 8am-3pm M-F  Incentives Substance [place] [address]    [place], [place] [phone]   [facility] [address] [place], [place], [place]   [facility] [address]  [place], [place]   [facility]ight Programs - Intensive Outpatient [address], [place], [place]   [facility] ([place].,  [place], [place] [phone] or [phone]   [facility]idential Treatment Services (RTS) [address] [place], [place] Accepts Medicaid  [facility] [place] [address],  [place] [phone] Substance Abuse/Addiction Treatment   [facility] Organization         Address  Phone  Notes  [facility]  [phone]   Domenic Schwab, PhD [address] [place], [place]   [phone] or [phone]   [place] [place], [place] [phone]   [facility] [address], [place], [place] [phone] Insurance/Medicaid/sponsorship through [facility] and Families [address], [address] [place]                                    [place], [place] [phone] [place] [address], [place] [phone]    Dr. Adele Schilder  [phone]   [facility] [place] Dept. 1) [address] [place] 2) [place] 3)  [place] [address], [place] [phone]  [phone]   [place] [phone] or [phone] (After Hours)

## 2014-04-14 NOTE — ED Provider Notes (Signed)
CSN: [id number]     Arrival date & time 04/14/14  0025 History   First MD Initiated Contact with Patient 04/14/14 0206     Chief Complaint  Patient presents with  . Leg Pain     (Consider location/radiation/quality/duration/timing/severity/associated sxs/prior Treatment) HPI 43 year old female presents to emergency department from home with complaint of right leg pain.  She's had intermittent crampy sharp pain in her right leg from her toe to her hip intermittently over the last month.  Pain usually at night.  No pain at present.  She is worried that something serious is wrong.  Patient reports she has history of left ventricular hypertrophy, and feels that her heart may be causing her leg pain.  No pain at present. Past Medical History  Diagnosis Date  . Palpitations   . Psychosis   . BIPOLAR DISORDER   . Substance abuse   . Delusions   . Homelessness   . Left ventricular hypertrophy   . Sickle cell trait   . Homelessness    Past Surgical History  Procedure Laterality Date  . Bunionectomy     Family History  Problem Relation Age of Onset  . Heart disease Mother     CHF   History  Substance Use Topics  . Smoking status: Current Every Day Smoker -- 5 years    Types: Cigarettes  . Smokeless tobacco: Not on file  . Alcohol Use: No   OB History   Grav Para Term Preterm Abortions TAB SAB Ect Mult Living   1              Review of Systems  See History of Present Illness; otherwise all other systems are reviewed and negative   Allergies  Strawberry  Home Medications   Prior to Admission medications   Medication Sig Start Date End Date Taking? Authorizing Provider  cholecalciferol (VITAMIN D) 1000 UNITS tablet Take 1,000 Units by mouth once a week.    Yes Historical Provider, MD  ibuprofen (ADVIL,MOTRIN) 200 MG tablet Take 200 mg by mouth every 6 (six) hours as needed for pain.   Yes Historical Provider, MD  LORazepam (ATIVAN) 0.5 MG tablet Take 0.5 mg by mouth every  8 (eight) hours as needed for anxiety.    Yes Historical Provider, MD  nitroGLYCERIN (NITROSTAT) 0.4 MG SL tablet Place 1 tablet (0.4 mg total) under the tongue every 5 (five) minutes as needed for chest pain. 05/24/13  Yes Nicole Pisciotta, PA-C   BP 113/75  Pulse 66  Temp(Src) 98.4 F (36.9 C) (Oral)  Resp 20  SpO2 97%  LMP 04/05/2014 Physical Exam  Nursing note and vitals reviewed. Constitutional: She is oriented to person, place, and time. She appears well-developed and well-nourished.  HENT:  Head: Normocephalic and atraumatic.  Nose: Nose normal.  Mouth/Throat: Oropharynx is clear and moist.  Eyes: Conjunctivae and EOM are normal. Pupils are equal, round, and reactive to light.  Neck: Normal range of motion. Neck supple. No JVD present. No tracheal deviation present. No thyromegaly present.  Cardiovascular: Normal rate, regular rhythm, normal heart sounds and intact distal pulses.  Exam reveals no gallop and no friction rub.   No murmur heard. Pulmonary/Chest: Effort normal and breath sounds normal. No stridor. No respiratory distress. She has no wheezes. She has no rales. She exhibits no tenderness.  Abdominal: Soft. Bowel sounds are normal. She exhibits no distension and no mass. There is no tenderness. There is no rebound and no guarding.  Musculoskeletal: Normal range  of motion. She exhibits no edema and no tenderness.  Lymphadenopathy:    She has no cervical adenopathy.  Neurological: She is alert and oriented to person, place, and time. She exhibits normal muscle tone. Coordination normal.  Skin: Skin is warm and dry. No rash noted. No erythema. No pallor.  Psychiatric: She has a normal mood and affect. Her behavior is normal. Judgment and thought content normal.    ED Course  Procedures (including critical care time) Labs Review Labs Reviewed  I-STAT CHEM 8, ED - Abnormal; Notable for the following:    Potassium 3.4 (*)    All other components within normal limits     Imaging Review No results found.   EKG Interpretation None      MDM   Final diagnoses:  Leg cramps, sleep related    43 year old female with intermittent leg cramps over the last month usually at night.  Workup here unremarkable other than slightly low potassium.  We'll supplement to see if this helps with symptoms, and give patient handouts for dietary sources of potassium.   Kalman Drape, MD 04/14/14 [phone]

## 2014-04-17 ENCOUNTER — Emergency Department (HOSPITAL_COMMUNITY)
Admission: EM | Admit: 2014-04-17 | Discharge: 2014-04-17 | Disposition: A | Discharge status: Home / Self Care | Payer: Medicaid Other | Attending: Emergency Medicine | Admitting: Emergency Medicine

## 2014-04-17 ENCOUNTER — Encounter (HOSPITAL_COMMUNITY): Payer: Self-pay | Admitting: Emergency Medicine

## 2014-04-17 DIAGNOSIS — Z862 Personal history of diseases of the blood and blood-forming organs and certain disorders involving the immune mechanism: Secondary | ICD-10-CM | POA: Insufficient documentation

## 2014-04-17 DIAGNOSIS — F17200 Nicotine dependence, unspecified, uncomplicated: Secondary | ICD-10-CM | POA: Insufficient documentation

## 2014-04-17 DIAGNOSIS — M79609 Pain in unspecified limb: Secondary | ICD-10-CM | POA: Insufficient documentation

## 2014-04-17 DIAGNOSIS — F319 Bipolar disorder, unspecified: Secondary | ICD-10-CM | POA: Insufficient documentation

## 2014-04-17 DIAGNOSIS — Z8679 Personal history of other diseases of the circulatory system: Secondary | ICD-10-CM | POA: Insufficient documentation

## 2014-04-17 DIAGNOSIS — Z79899 Other long term (current) drug therapy: Secondary | ICD-10-CM | POA: Insufficient documentation

## 2014-04-17 DIAGNOSIS — M79604 Pain in right leg: Secondary | ICD-10-CM

## 2014-04-17 DIAGNOSIS — E876 Hypokalemia: Secondary | ICD-10-CM | POA: Insufficient documentation

## 2014-04-17 DIAGNOSIS — Z23 Encounter for immunization: Secondary | ICD-10-CM | POA: Insufficient documentation

## 2014-04-17 MED ORDER — IBUPROFEN 400 MG PO TABS
800.0000 mg | ORAL_TABLET | Freq: Once | ORAL | Status: AC
  Administered 2014-04-17: 800 mg via ORAL
  Filled 2014-04-17: qty 2

## 2014-04-17 NOTE — ED Notes (Addendum)
Pt refusing EKG at this time; states "just want someone to look at leg". Refusing blood work as well despite RN's attempts to explain importance. Pt has flight of ideas. POC discussed with FT nurse.

## 2014-04-17 NOTE — ED Notes (Addendum)
Pt c/o right leg pain and swelling x 1 month. Was seen here last week for same then saw her PCP for follow up. Was told it was "sciatica". Pt is not convinced that is the problem, "They told me I had a borderline heart problem and then tell me I have sciatica, I think they are related". Pt's thoughts seem to be poorly organized.  Swelling noted to both feet, right > left. States pain is worse at rest, "can't sleep".

## 2014-04-17 NOTE — ED Notes (Signed)
Refused wheelchair 

## 2014-04-17 NOTE — Discharge Instructions (Signed)
You may take tylenol and ibuprofen as needed for pain. Be sure to keep eating a diet high in potassium and follow up with your family provider for recheck of continued leg pain.

## 2014-04-17 NOTE — Progress Notes (Signed)
VASCULAR LAB PRELIMINARY  PRELIMINARY  PRELIMINARY  PRELIMINARY  Right lower extremity venous duplex completed.    Preliminary report:  Right:  No evidence of DVT, superficial thrombosis, or Baker's cyst.  Nobuo Nunziata, RVS 04/17/2014, 3:35 PM

## 2014-04-17 NOTE — ED Notes (Signed)
Pt no answer for EKG and triage for 20 minutes because pt in the bathroom.

## 2014-04-17 NOTE — ED Provider Notes (Signed)
CSN: [id number]     Arrival date & time 04/17/14  1248 History  This chart was scribed for non-physician practitioner, Noland Fordyce, PA-C working with Neta Ehlers, MD by Frederich Balding, ED scribe. This patient was seen in room TR05C/TR05C and the patient's care was started at 2:09 PM.   Chief Complaint  Patient presents with  . Leg Pain   The history is provided by the patient. No language interpreter was used.   HPI Comments: Anita Hill is a 43 y.o. female with history of psychosis, bipolar disorder, substance abuse, left ventricular hypertrophy and palpitations who presents to the Emergency Department complaining of intermittent throbbing right leg pain that started one month ago. States the pain is in her entire leg. Pain is normally worse at night and states it sometimes feels like a charley horse. Ambulation relieves some pain. Pt was evaluated for the same 3 days ago and given potassium with little relief. She followed up with her PCP after being seen in the ED, and initially stated nothing had been done there, but then remembered her PCP had called in some ibuprofen she forgot to pick up. Pt is requesting an xray today as she is concerned for a blood clot. States she was on blood thinners for 3 years but is unsure why. Denies any trauma. Denies chest pain or SOB.  Past Medical History  Diagnosis Date  . Palpitations   . Psychosis   . BIPOLAR DISORDER   . Substance abuse   . Delusions   . Homelessness   . Left ventricular hypertrophy   . Sickle cell trait   . Homelessness    Past Surgical History  Procedure Laterality Date  . Bunionectomy     Family History  Problem Relation Age of Onset  . Heart disease Mother     CHF   History  Substance Use Topics  . Smoking status: Current Every Day Smoker -- 5 years    Types: Cigarettes  . Smokeless tobacco: Not on file  . Alcohol Use: No   OB History   Grav Para Term Preterm Abortions TAB SAB Ect Mult Living   1               Review of Systems  Musculoskeletal: Positive for myalgias.  All other systems reviewed and are negative.  Allergies  Strawberry  Home Medications   Prior to Admission medications   Medication Sig Start Date End Date Taking? Authorizing Provider  cholecalciferol (VITAMIN D) 1000 UNITS tablet Take 1,000 Units by mouth once a week.     Historical Provider, MD  ibuprofen (ADVIL,MOTRIN) 200 MG tablet Take 200 mg by mouth every 6 (six) hours as needed for pain.    Historical Provider, MD  LORazepam (ATIVAN) 0.5 MG tablet Take 0.5 mg by mouth every 8 (eight) hours as needed for anxiety.     Historical Provider, MD  nitroGLYCERIN (NITROSTAT) 0.4 MG SL tablet Place 1 tablet (0.4 mg total) under the tongue every 5 (five) minutes as needed for chest pain. 05/24/13   Nicole Pisciotta, PA-C   BP 105/69  Pulse 57  Temp(Src) 98.8 F (37.1 C) (Oral)  SpO2 100%  LMP 04/05/2014  Physical Exam  Nursing note and vitals reviewed. Constitutional: She is oriented to person, place, and time. She appears well-developed and well-nourished.  HENT:  Head: Normocephalic and atraumatic.  Eyes: EOM are normal.  Neck: Normal range of motion.  Cardiovascular: Normal rate.   Pulmonary/Chest: Effort normal.  Musculoskeletal:  Normal range of motion.  No erythema, edema or ecchymosis to right leg. Full ROM of right knee and ankle. No point tenderness.   Neurological: She is alert and oriented to person, place, and time.  Skin: Skin is warm and dry.  Psychiatric: She has a normal mood and affect. Her behavior is normal.    ED Course  Procedures (including critical care time)  DIAGNOSTIC STUDIES: Oxygen Saturation is 100% on RA, normal by my interpretation.    COORDINATION OF CARE: 2:14 PM-Discussed treatment plan which includes ultrasound with pt at bedside and pt agreed to plan.   Labs Review Labs Reviewed - No data to display  Imaging Review No results found.   EKG Interpretation None       MDM   Final diagnoses:  Leg pain, right   pt c/o right leg pain. Dx 3 days ago with hypokalemia however states she is still concerned something is wrong, "like a blood clot" in her leg.  Right leg is neurovascularly in tact with no evidence of underlying infection. However, as pt reports being on blood thinners 3 years ago and does not recall why, venous U/S was performed.  Negative for DVT.  Pain likely muscular in nature. Provided pt information on diet high in potassium as well as musculoskeletal pain. Advised to use acetaminophen and ibuprofen, f/u with PCP.  Return precautions provided.     I personally performed the services described in this documentation, which was scribed in my presence. The recorded information has been reviewed and is accurate.  Noland Fordyce, PA-C 04/18/14 [phone]

## 2014-04-17 NOTE — ED Notes (Signed)
Pt reports her right leg hurts when she is at rest; requesting xray. Aching pain described as "heart palpitation in leg" or a "charley horse". Pt states she is having shortness of breathe and ties a cardigan very tightly aroundd body which she says makes her feel better. PT states she has mitral valve issues.

## 2014-04-19 NOTE — ED Provider Notes (Signed)
Medical screening examination/treatment/procedure(s) were performed by non-physician practitioner and as supervising physician I was immediately available for consultation/collaboration.   Neta Ehlers, MD 04/19/14 0010

## 2014-04-25 ENCOUNTER — Encounter (HOSPITAL_COMMUNITY): Payer: Self-pay | Admitting: Emergency Medicine

## 2014-04-25 ENCOUNTER — Emergency Department (HOSPITAL_COMMUNITY)
Admission: EM | Admit: 2014-04-25 | Discharge: 2014-04-25 | Disposition: A | Discharge status: Home / Self Care | Payer: Medicaid Other | Attending: Emergency Medicine | Admitting: Emergency Medicine

## 2014-04-25 DIAGNOSIS — M25572 Pain in left ankle and joints of left foot: Secondary | ICD-10-CM

## 2014-04-25 DIAGNOSIS — F17200 Nicotine dependence, unspecified, uncomplicated: Secondary | ICD-10-CM | POA: Insufficient documentation

## 2014-04-25 DIAGNOSIS — Z5900 Homelessness unspecified: Secondary | ICD-10-CM | POA: Insufficient documentation

## 2014-04-25 DIAGNOSIS — M25579 Pain in unspecified ankle and joints of unspecified foot: Secondary | ICD-10-CM | POA: Insufficient documentation

## 2014-04-25 DIAGNOSIS — M25571 Pain in right ankle and joints of right foot: Secondary | ICD-10-CM

## 2014-04-25 DIAGNOSIS — Z8659 Personal history of other mental and behavioral disorders: Secondary | ICD-10-CM | POA: Insufficient documentation

## 2014-04-25 DIAGNOSIS — Z862 Personal history of diseases of the blood and blood-forming organs and certain disorders involving the immune mechanism: Secondary | ICD-10-CM | POA: Insufficient documentation

## 2014-04-25 DIAGNOSIS — Z8679 Personal history of other diseases of the circulatory system: Secondary | ICD-10-CM | POA: Insufficient documentation

## 2014-04-25 DIAGNOSIS — Z791 Long term (current) use of non-steroidal anti-inflammatories (NSAID): Secondary | ICD-10-CM | POA: Insufficient documentation

## 2014-04-25 LAB — I-STAT CHEM 8, ED
BUN: 8 mg/dL (ref 6–23)
CALCIUM ION: 1.14 mmol/L (ref 1.12–1.23)
CHLORIDE: 108 meq/L (ref 96–112)
CREATININE: 0.8 mg/dL (ref 0.50–1.10)
GLUCOSE: 97 mg/dL (ref 70–99)
HEMATOCRIT: 36 % (ref 36.0–46.0)
Hemoglobin: 12.2 g/dL (ref 12.0–15.0)
POTASSIUM: 3.7 meq/L (ref 3.7–5.3)
Sodium: 140 mEq/L (ref 137–147)
TCO2: 23 mmol/L (ref 0–100)

## 2014-04-25 LAB — PRO B NATRIURETIC PEPTIDE: Pro B Natriuretic peptide (BNP): 61.8 pg/mL (ref 0–125)

## 2014-04-25 MED ORDER — IBUPROFEN 600 MG PO TABS: 600.0000 mg | ORAL_TABLET | Freq: Four times a day (QID) | ORAL | Status: DC | PRN

## 2014-04-25 MED ORDER — DIAZEPAM 2 MG PO TABS
2.0000 mg | ORAL_TABLET | Freq: Once | ORAL | Status: AC
  Administered 2014-04-25: 2 mg via ORAL
  Filled 2014-04-25: qty 1

## 2014-04-25 MED ORDER — METHOCARBAMOL 500 MG PO TABS: 500.0000 mg | ORAL_TABLET | Freq: Two times a day (BID) | ORAL | Status: DC

## 2014-04-25 NOTE — ED Notes (Signed)
Patient reports she has no psych history. Denies history of psychosis, or bipolar.

## 2014-04-25 NOTE — ED Provider Notes (Signed)
CSN: [id number]     Arrival date & time 04/25/14  0556 History   First MD Initiated Contact with Patient 04/25/14 [phone]     Chief Complaint  Patient presents with  . Leg Swelling     (Consider location/radiation/quality/duration/timing/severity/associated sxs/prior Treatment) HPI Comments: Patient is a 42 yo F PMHx significant for psychosis, bipolar disorder, substance abuse, delusions, sickle cell trait presenting to the ED for bilateral ankle swelling and cramping discomfort that has been present for four weeks, but has been getting progressively more constant over the last two weeks. Alleviating factors: none. Aggravating factors: none. Medications tried prior to arrival: none. Denies any injuries or falls. Denies any history of similar ankle discomfort or swelling in past. Denies fevers, chills, nausea, vomiting, diarrhea, abdominal pain, CP, SOB, DOE, orthopnea.      Past Medical History  Diagnosis Date  . Palpitations   . Psychosis   . BIPOLAR DISORDER   . Substance abuse   . Delusions   . Homelessness   . Left ventricular hypertrophy   . Sickle cell trait   . Homelessness    Past Surgical History  Procedure Laterality Date  . Bunionectomy     Family History  Problem Relation Age of Onset  . Heart disease Mother     CHF   History  Substance Use Topics  . Smoking status: Current Every Day Smoker -- 1.00 packs/day for 10 years    Types: Cigarettes  . Smokeless tobacco: Not on file  . Alcohol Use: No   OB History   Grav Para Term Preterm Abortions TAB SAB Ect Mult Living   1              Review of Systems  Musculoskeletal: Positive for arthralgias, joint swelling and myalgias.  All other systems reviewed and are negative.     Allergies  Strawberry  Home Medications   Prior to Admission medications   Medication Sig Start Date End Date Taking? Authorizing Provider  diclofenac (VOLTAREN) 75 MG EC tablet Take 75 mg by mouth 2 (two) times daily.   Yes  Historical Provider, MD  LORazepam (ATIVAN) 0.5 MG tablet Take 0.5 mg by mouth every 8 (eight) hours as needed for anxiety.    Yes Historical Provider, MD  nitroGLYCERIN (NITROSTAT) 0.4 MG SL tablet Place 1 tablet (0.4 mg total) under the tongue every 5 (five) minutes as needed for chest pain. 05/24/13  Yes Nicole Pisciotta, PA-C  ibuprofen (ADVIL,MOTRIN) 600 MG tablet Take 1 tablet (600 mg total) by mouth every 6 (six) hours as needed. 04/25/14   Jennifer L Piepenbrink, PA-C  methocarbamol (ROBAXIN) 500 MG tablet Take 1 tablet (500 mg total) by mouth 2 (two) times daily. 04/25/14   Jennifer L Piepenbrink, PA-C   BP 97/62  Pulse 47  Temp(Src) 98.4 F (36.9 C) (Oral)  Resp 16  Ht 5\' 5"  (1.651 m)  Wt 132 lb (59.875 kg)  BMI 21.97 kg/m2  SpO2 98%  LMP 04/05/2014 Physical Exam  Nursing note and vitals reviewed. Constitutional: She is oriented to person, place, and time. She appears well-developed and well-nourished. No distress.  HENT:  Head: Normocephalic and atraumatic.  Right Ear: External ear normal.  Left Ear: External ear normal.  Nose: Nose normal.  Mouth/Throat: Oropharynx is clear and moist.  Eyes: Conjunctivae are normal.  Neck: Normal range of motion. Neck supple.  Cardiovascular: Normal rate, regular rhythm, normal heart sounds and intact distal pulses.   Pulmonary/Chest: Effort normal and breath sounds normal.  No respiratory distress.  Abdominal: Soft. There is no tenderness.  Musculoskeletal: Normal range of motion. She exhibits no edema.       Right ankle: She exhibits normal range of motion, no swelling, no ecchymosis, no deformity, no laceration and normal pulse. Tenderness (mild). Lateral malleolus tenderness found.       Left ankle: She exhibits normal range of motion, no swelling, no ecchymosis, no deformity, no laceration and normal pulse. Tenderness (mild). Lateral malleolus tenderness found.       Right lower leg: Normal.       Left lower leg: Normal.       Right  foot: Normal.       Left foot: Normal.  Neurological: She is alert and oriented to person, place, and time. Gait normal. GCS eye subscore is 4. GCS verbal subscore is 5. GCS motor subscore is 6.  Skin: Skin is warm and dry. She is not diaphoretic.  Psychiatric: She has a normal mood and affect.    ED Course  Procedures (including critical care time) Medications  diazepam (VALIUM) tablet 2 mg (2 mg Oral Given 04/25/14 0730)    Labs Review Labs Reviewed  PRO B NATRIURETIC PEPTIDE  I-STAT CHEM 8, ED    Imaging Review No results found.   EKG Interpretation None      MDM   Final diagnoses:  Bilateral ankle pain    Filed Vitals:   04/25/14 0900  BP: 97/62  Pulse: 47  Temp:   Resp: 16   Afebrile, NAD, non-toxic appearing, AAOx4.  Neurovascularly intact. Normal sensation. ROM Intact. No edema noted. No swelling noted. No erythema or warmth to suggest infection. BNP normal. Chem 8 reviewed. Negative LE doppler a few days ago. Likely musculoskeletal in nature. Return precautions discussed. Patient is agreeable to plan. Patient is stable at time of discharge       Harlow Mares, PA-C 04/25/14 1633

## 2014-04-25 NOTE — Discharge Instructions (Signed)
Please follow up with your primary care physician in 1-2 days. If you do not have one please call the [facility] number listed above. Please take pain medication and/or muscle relaxants as prescribed and as needed for pain. Please do not drive on narcotic pain medication or on muscle relaxants. Please alternate between Motrin and Tylenol every three hours for fevers and pain. Please read all discharge instructions and return precautions.    Ankle Pain Ankle pain is a common symptom. The bones, cartilage, tendons, and muscles of the ankle joint perform a lot of work each day. The ankle joint holds your body weight and allows you to move around. Ankle pain can occur on either side or back of 1 or both ankles. Ankle pain may be sharp and burning or dull and aching. There may be tenderness, stiffness, redness, or warmth around the ankle. The pain occurs more often when a person walks or puts pressure on the ankle. CAUSES  There are many reasons ankle pain can develop. It is important to work with your caregiver to identify the cause since many conditions can impact the bones, cartilage, muscles, and tendons. Causes for ankle pain include:  Injury, including a break (fracture), sprain, or strain often due to a fall, sports, or a high-impact activity.  Swelling (inflammation) of a tendon (tendonitis).  Achilles tendon rupture.  Ankle instability after repeated sprains and strains.  Poor foot alignment.  Pressure on a nerve (tarsal tunnel syndrome).  Arthritis in the ankle or the lining of the ankle.  Crystal formation in the ankle (gout or pseudogout). DIAGNOSIS  A diagnosis is based on your medical history, your symptoms, results of your physical exam, and results of diagnostic tests. Diagnostic tests may include X-ray exams or a computerized magnetic scan (magnetic resonance imaging, MRI). TREATMENT  Treatment will depend on the cause of your ankle pain and may  include:  Keeping pressure off the ankle and limiting activities.  Using crutches or other walking support (a cane or brace).  Using rest, ice, compression, and elevation.  Participating in physical therapy or home exercises.  Wearing shoe inserts or special shoes.  Losing weight.  Taking medications to reduce pain or swelling or receiving an injection.  Undergoing surgery. HOME CARE INSTRUCTIONS   Only take over-the-counter or prescription medicines for pain, discomfort, or fever as directed by your caregiver.  Put ice on the injured area.  Put ice in a plastic bag.  Place a towel between your skin and the bag.  Leave the ice on for 15-20 minutes at a time, 03-04 times a day.  Keep your leg raised (elevated) when possible to lessen swelling.  Avoid activities that cause ankle pain.  Follow specific exercises as directed by your caregiver.  Record how often you have ankle pain, the location of the pain, and what it feels like. This information may be helpful to you and your caregiver.  Ask your caregiver about returning to work or sports and whether you should drive.  Follow up with your caregiver for further examination, therapy, or testing as directed. SEEK MEDICAL CARE IF:   Pain or swelling continues or worsens beyond 1 week.  You have an oral temperature above 102 F (38.9 C).  You are feeling unwell or have chills.  You are having an increasingly difficult time with walking.  You have loss of sensation or other new symptoms.  You have questions or concerns. MAKE SURE YOU:   Understand these instructions.  Will watch your condition.  Will get help right away if you are not doing well or get worse. Document Released: 04/07/2010 Document Revised: 01/10/2012 Document Reviewed: 04/07/2010 [facility] Patient Information 2015 [place], [place]. This information is not intended to replace advice given to you by your health care provider. Make sure you discuss  any questions you have with your health care provider.  Arthralgia Your caregiver has diagnosed you as suffering from an arthralgia. Arthralgia means there is pain in a joint. This can come from many reasons including:  Bruising the joint which causes soreness (inflammation) in the joint.  Wear and tear on the joints which occur as we grow older (osteoarthritis).  Overusing the joint.  Various forms of arthritis.  Infections of the joint. Regardless of the cause of pain in your joint, most of these different pains respond to anti-inflammatory drugs and rest. The exception to this is when a joint is infected, and these cases are treated with antibiotics, if it is a bacterial infection. HOME CARE INSTRUCTIONS   Rest the injured area for as long as directed by your caregiver. Then slowly start using the joint as directed by your caregiver and as the pain allows. Crutches as directed may be useful if the ankles, knees or hips are involved. If the knee was splinted or casted, continue use and care as directed. If an stretchy or elastic wrapping bandage has been applied today, it should be removed and re-applied every 3 to 4 hours. It should not be applied tightly, but firmly enough to keep swelling down. Watch toes and feet for swelling, bluish discoloration, coldness, numbness or excessive pain. If any of these problems (symptoms) occur, remove the ace bandage and re-apply more loosely. If these symptoms persist, contact your caregiver or return to this location.  For the first 24 hours, keep the injured extremity elevated on pillows while lying down.  Apply ice for 15-20 minutes to the sore joint every couple hours while awake for the first half day. Then 03-04 times per day for the first 48 hours. Put the ice in a plastic bag and place a towel between the bag of ice and your skin.  Wear any splinting, casting, elastic bandage applications, or slings as instructed.  Only take over-the-counter  or prescription medicines for pain, discomfort, or fever as directed by your caregiver. Do not use aspirin immediately after the injury unless instructed by your physician. Aspirin can cause increased bleeding and bruising of the tissues.  If you were given crutches, continue to use them as instructed and do not resume weight bearing on the sore joint until instructed. Persistent pain and inability to use the sore joint as directed for more than 2 to 3 days are warning signs indicating that you should see a caregiver for a follow-up visit as soon as possible. Initially, a hairline fracture (break in bone) may not be evident on X-rays. Persistent pain and swelling indicate that further evaluation, non-weight bearing or use of the joint (use of crutches or slings as instructed), or further X-rays are indicated. X-rays may sometimes not show a small fracture until a week or 10 days later. Make a follow-up appointment with your own caregiver or one to whom we have referred you. A radiologist (specialist in reading X-rays) may read your X-rays. Make sure you know how you are to obtain your X-ray results. Do not assume everything is normal if you do not hear from [place]. SEEK MEDICAL CARE IF: Bruising, swelling, or pain  increases. SEEK IMMEDIATE MEDICAL CARE IF:   Your fingers or toes are numb or blue.  The pain is not responding to medications and continues to stay the same or get worse.  The pain in your joint becomes severe.  You develop a fever over 102 F (38.9 C).  It becomes impossible to move or use the joint. MAKE SURE YOU:   Understand these instructions.  Will watch your condition.  Will get help right away if you are not doing well or get worse. Document Released: 10/18/2005 Document Revised: 01/10/2012 Document Reviewed: 06/05/2008 [facility] Patient Information 2015 [place], [place]. This information is not intended to replace advice given to you by your health care provider. Make sure you  discuss any questions you have with your health care provider.

## 2014-04-25 NOTE — ED Notes (Signed)
Patient reports she was trying to wait until morning for ankle swelling she noted, but could not bear the pain.  +2 non-pitting edema noted in both ankles.  Previous pain in left thigh managed as outpatient.  History of right mitral valve, borderline "LVH".

## 2014-04-28 NOTE — ED Provider Notes (Signed)
Medical screening examination/treatment/procedure(s) were performed by non-physician practitioner and as supervising physician I was immediately available for consultation/collaboration.   EKG Interpretation None        Houston Siren III, MD 04/28/14 [phone]

## 2014-05-24 ENCOUNTER — Encounter (HOSPITAL_COMMUNITY): Payer: Self-pay | Admitting: Emergency Medicine

## 2014-05-24 ENCOUNTER — Emergency Department (HOSPITAL_COMMUNITY)
Admission: EM | Admit: 2014-05-24 | Discharge: 2014-05-24 | Disposition: A | Discharge status: Home / Self Care | Payer: Medicaid Other | Attending: Emergency Medicine | Admitting: Emergency Medicine

## 2014-05-24 DIAGNOSIS — IMO0002 Reserved for concepts with insufficient information to code with codable children: Secondary | ICD-10-CM | POA: Insufficient documentation

## 2014-05-24 DIAGNOSIS — Y9289 Other specified places as the place of occurrence of the external cause: Secondary | ICD-10-CM | POA: Diagnosis not present

## 2014-05-24 DIAGNOSIS — Y9389 Activity, other specified: Secondary | ICD-10-CM | POA: Insufficient documentation

## 2014-05-24 DIAGNOSIS — I517 Cardiomegaly: Secondary | ICD-10-CM | POA: Diagnosis not present

## 2014-05-24 DIAGNOSIS — S1096XA Insect bite of unspecified part of neck, initial encounter: Secondary | ICD-10-CM | POA: Diagnosis present

## 2014-05-24 DIAGNOSIS — Z5900 Homelessness unspecified: Secondary | ICD-10-CM | POA: Insufficient documentation

## 2014-05-24 DIAGNOSIS — F17200 Nicotine dependence, unspecified, uncomplicated: Secondary | ICD-10-CM | POA: Diagnosis not present

## 2014-05-24 DIAGNOSIS — Z862 Personal history of diseases of the blood and blood-forming organs and certain disorders involving the immune mechanism: Secondary | ICD-10-CM | POA: Insufficient documentation

## 2014-05-24 DIAGNOSIS — Z791 Long term (current) use of non-steroidal anti-inflammatories (NSAID): Secondary | ICD-10-CM | POA: Diagnosis not present

## 2014-05-24 DIAGNOSIS — S30860A Insect bite (nonvenomous) of lower back and pelvis, initial encounter: Secondary | ICD-10-CM | POA: Diagnosis not present

## 2014-05-24 DIAGNOSIS — F319 Bipolar disorder, unspecified: Secondary | ICD-10-CM | POA: Insufficient documentation

## 2014-05-24 DIAGNOSIS — W57XXXA Bitten or stung by nonvenomous insect and other nonvenomous arthropods, initial encounter: Secondary | ICD-10-CM | POA: Insufficient documentation

## 2014-05-24 MED ORDER — HYDROCORTISONE 1 % EX CREA: 1.0000 "application " | TOPICAL_CREAM | Freq: Two times a day (BID) | CUTANEOUS | Status: DC

## 2014-05-24 MED ORDER — DIPHENHYDRAMINE HCL 25 MG PO TABS: 25.0000 mg | ORAL_TABLET | Freq: Four times a day (QID) | ORAL | Status: DC

## 2014-05-24 MED ORDER — PERMETHRIN 5 % EX CREA: TOPICAL_CREAM | CUTANEOUS | Status: DC

## 2014-05-24 NOTE — ED Notes (Signed)
Pt registered and then walked outside

## 2014-05-24 NOTE — ED Provider Notes (Signed)
CSN: [id number]     Arrival date & time 05/24/14  1314 History  This chart was scribed for non-physician practitioner, Noland Fordyce, PA-C,working with Carmin Muskrat, MD, by Marlowe Kays, ED Scribe.  This patient was seen in room TR09C/TR09C and the patient's care was started at 2:11 PM.  Chief Complaint  Patient presents with  . Insect Bite   The history is provided by the patient. No language interpreter was used.   HPI Comments:  Anita Hill is a 43 y.o. homeless female with h/o psychosis, substance abuse and bipolar disorder who presents to the Emergency Department complaining of insect bites to her neck, face, back, and behind her right ear onset two days ago. Pt reports subjective fever. She states the bites are painful and itchy. Pt states she is homeless and concerned she has bed bugs and scabies. She denies drainage or bleeding of the areas or nausea or vomiting.   Past Medical History  Diagnosis Date  . Palpitations   . Psychosis   . BIPOLAR DISORDER   . Substance abuse   . Delusions   . Homelessness   . Left ventricular hypertrophy   . Sickle cell trait   . Homelessness    Past Surgical History  Procedure Laterality Date  . Bunionectomy     Family History  Problem Relation Age of Onset  . Heart disease Mother     CHF   History  Substance Use Topics  . Smoking status: Current Every Day Smoker -- 1.00 packs/day for 10 years    Types: Cigarettes  . Smokeless tobacco: Not on file  . Alcohol Use: No   OB History   Grav Para Term Preterm Abortions TAB SAB Ect Mult Living   1              Review of Systems  Constitutional: Positive for fever.  Gastrointestinal: Negative for nausea and vomiting.  Skin: Positive for rash. Negative for wound.  All other systems reviewed and are negative.   Allergies  Strawberry  Home Medications   Prior to Admission medications   Medication Sig Start Date End Date Taking? Authorizing Provider  diclofenac  (VOLTAREN) 75 MG EC tablet Take 75 mg by mouth 2 (two) times daily.   Yes Historical Provider, MD  ibuprofen (ADVIL,MOTRIN) 600 MG tablet Take 600 mg by mouth every 6 (six) hours as needed (pain).    Yes Historical Provider, MD  LORazepam (ATIVAN) 0.5 MG tablet Take 0.5 mg by mouth every 8 (eight) hours as needed for anxiety.    Yes Historical Provider, MD  nitroGLYCERIN (NITROSTAT) 0.4 MG SL tablet Place 1 tablet (0.4 mg total) under the tongue every 5 (five) minutes as needed for chest pain. 05/24/13  Yes Nicole Pisciotta, PA-C  diphenhydrAMINE (BENADRYL) 25 MG tablet Take 1 tablet (25 mg total) by mouth every 6 (six) hours. 05/24/14   Noland Fordyce, PA-C  hydrocortisone cream 1 % Apply 1 application topically 2 (two) times daily. 05/24/14   Noland Fordyce, PA-C  permethrin (ELIMITE) 5 % cream Apply cream from chin to toes, allow to sit for 8-10 hours, then wash off with soap and water 05/24/14   Noland Fordyce, PA-C   Triage Vitals: BP 117/67  Pulse 115  Temp(Src) 99.6 F (37.6 C) (Oral)  Resp 20  SpO2 95%  LMP 03/04/2014 Physical Exam  Nursing note and vitals reviewed. Constitutional: She is oriented to person, place, and time. She appears well-developed and well-nourished.  HENT:  Head: Normocephalic  and atraumatic.  Eyes: EOM are normal.  Neck: Normal range of motion.  Cardiovascular: Normal rate.   Pulmonary/Chest: Effort normal.  Musculoskeletal: Normal range of motion.  Neurological: She is alert and oriented to person, place, and time.  Skin: Skin is warm and dry. Rash noted.  Erythematous vesicles on back and neck, over angle of the jaw on the right side and two spots on back. Scratch marks on web spacing between fingers of right hand.  No evidence of underlying infection.   Psychiatric: She has a normal mood and affect. Her behavior is normal.    ED Course  Procedures (including critical care time) DIAGNOSTIC STUDIES: Oxygen Saturation is 95% on RA, adequate by my  interpretation.   COORDINATION OF CARE: 2:14 PM- Will prescribe Permethrin cream. Pt verbalizes understanding and agrees to plan.  Medications - No data to display  Labs Review Labs Reviewed - No data to display  Imaging Review No results found.   EKG Interpretation None      MDM   Final diagnoses:  Insect bites    Pt has rash that appears to be insect bites on back of her neck, right side of face and lower back. Pt also has scratch marks between web spaces of right hand but no track marks. Pt is concerned for bed bugs and scabies. Pt states she is homeless.  No evidence of underlying infection. Will tx with permethrin cream, hydrocortisone cream and benadryl. Advised to f/u with her PCP. Return precautions provided. Pt verbalized understanding and agreement with tx plan.   I personally performed the services described in this documentation, which was scribed in my presence. The recorded information has been reviewed and is accurate.    Noland Fordyce, PA-C 05/24/14 1457

## 2014-05-24 NOTE — ED Provider Notes (Signed)
  Medical screening examination/treatment/procedure(s) were performed by non-physician practitioner and as supervising physician I was immediately available for consultation/collaboration.   EKG Interpretation None         Carmin Muskrat, MD 05/24/14 [phone]

## 2014-05-24 NOTE — Discharge Instructions (Signed)
Bedbugs °Bedbugs are tiny bugs that live in and around beds. They come out at night and bite people lying in bed. Bedbug bites rarely cause a medical problem. The bites do cause red, itchy bumps. °HOME CARE °· Only take medicine as told by your doctor. °· Wear pajamas with long sleeves and pant legs. °· Call a pest control expert. You may need to throw away your mattress. Ask the pest control expert what you can do to keep the bedbugs from coming back. You may need to: °¨ Put a plastic cover over your mattress. °¨ Wash your clothes and bedding in hot water. Dry them in a hot dryer. The temperature should be hotter than 120° F (48.9° C). °¨ Vacuum all around your bed often. °¨ Check all used furniture, bedding, or clothes for bedbugs before you bring them into your house. °¨ Remove bird nests and bat perches around your home. °· After you travel, check your clothes and luggage for bedbugs before you bring them into your house. If you find any bedbugs, throw those items away. °GET HELP RIGHT AWAY IF: °· You have a fever. °· You have red bug bites that keep coming back. °· You have red bug bites that itch badly. °· You have bug bites that cause a skin rash. °· You have scratch marks that are red and sore. °MAKE SURE YOU: °· Understand these instructions. °· Will watch your condition. °· Will get help right away if you are not doing well or get worse. °Document Released: 02/02/2011 Document Revised: 01/10/2012 Document Reviewed: 02/02/2011 °[employer]® Patient Information ©2015 [employer]. This information is not intended to replace advice given to you by your health care provider. Make sure you discuss any questions you have with your health care provider. ° °

## 2014-05-24 NOTE — ED Notes (Signed)
Pt reports bug bites to the back of her neck and behind her right ear. Itches and hurts

## 2014-06-02 ENCOUNTER — Emergency Department (HOSPITAL_COMMUNITY)
Admission: EM | Admit: 2014-06-02 | Discharge: 2014-06-02 | Disposition: A | Discharge status: Home / Self Care | Payer: Medicaid Other | Attending: Emergency Medicine | Admitting: Emergency Medicine

## 2014-06-02 ENCOUNTER — Encounter (HOSPITAL_COMMUNITY): Payer: Self-pay | Admitting: Emergency Medicine

## 2014-06-02 DIAGNOSIS — R079 Chest pain, unspecified: Secondary | ICD-10-CM | POA: Insufficient documentation

## 2014-06-02 NOTE — ED Notes (Signed)
Pt reprots cp ongoing since 2009. Cardiac hx. Today pain started at 6pm. Denies sob, nv. Pain radiates down L arm. Pt also reports pain to R leg.

## 2014-06-02 NOTE — ED Notes (Signed)
Patient still refusing to have blood drawn or go to XR. Patient states " If the EKG is normal than I do not want to have any blood drawn or Xrays, I will just leave and see my PCP and let him do bloodwork". She said that she was going to leave and try to follow up with her PCP.
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
## 2014-06-20 ENCOUNTER — Emergency Department (HOSPITAL_COMMUNITY): Payer: Medicaid Other

## 2014-06-20 ENCOUNTER — Emergency Department (HOSPITAL_COMMUNITY)
Admission: EM | Admit: 2014-06-20 | Discharge: 2014-06-20 | Discharge status: Against Medical Advice | Payer: Medicaid Other | Attending: Emergency Medicine | Admitting: Emergency Medicine

## 2014-06-20 ENCOUNTER — Encounter (HOSPITAL_COMMUNITY): Payer: Self-pay | Admitting: Emergency Medicine

## 2014-06-20 DIAGNOSIS — F17200 Nicotine dependence, unspecified, uncomplicated: Secondary | ICD-10-CM | POA: Insufficient documentation

## 2014-06-20 DIAGNOSIS — M79609 Pain in unspecified limb: Secondary | ICD-10-CM | POA: Insufficient documentation

## 2014-06-20 DIAGNOSIS — R079 Chest pain, unspecified: Secondary | ICD-10-CM | POA: Diagnosis not present

## 2014-06-20 LAB — I-STAT TROPONIN, ED: Troponin i, poc: 0 ng/mL (ref 0.00–0.08)

## 2014-06-20 LAB — CBC
HEMATOCRIT: 35.1 % — AB (ref 36.0–46.0)
Hemoglobin: 12.3 g/dL (ref 12.0–15.0)
MCH: 29 pg (ref 26.0–34.0)
MCHC: 35 g/dL (ref 30.0–36.0)
MCV: 82.8 fL (ref 78.0–100.0)
Platelets: 216 10*3/uL (ref 150–400)
RBC: 4.24 MIL/uL (ref 3.87–5.11)
RDW: 13.3 % (ref 11.5–15.5)
WBC: 5.6 10*3/uL (ref 4.0–10.5)

## 2014-06-20 LAB — BASIC METABOLIC PANEL
Anion gap: 11 (ref 5–15)
BUN: 10 mg/dL (ref 6–23)
CHLORIDE: 103 meq/L (ref 96–112)
CO2: 28 meq/L (ref 19–32)
CREATININE: 0.77 mg/dL (ref 0.50–1.10)
Calcium: 9.2 mg/dL (ref 8.4–10.5)
GFR calc Af Amer: >90 mL/min (ref >90)
GFR calc non Af Amer: >90 mL/min (ref >90)
Glucose, Bld: 82 mg/dL (ref 70–99)
Potassium: 3.4 mEq/L — ABNORMAL LOW (ref 3.7–5.3)
Sodium: 142 mEq/L (ref 137–147)

## 2014-06-20 NOTE — ED Notes (Signed)
Pt now reports sharp pain under L breast that started while being triaged.  Denies sob, nausea, and vomiting.

## 2014-06-20 NOTE — ED Notes (Signed)
C/o pain to R leg ("entire leg") x 6 weeks.  Pt denies injury.  Relates pain to Vitamin D injection she received 6 weeks ago.  Pt has previously been seen in ED for R leg pain and diagnosed with sciatica but pt states that is not what is wrong.

## 2014-06-21 ENCOUNTER — Emergency Department (HOSPITAL_COMMUNITY)
Admission: EM | Admit: 2014-06-21 | Discharge: 2014-06-21 | Disposition: A | Discharge status: Home / Self Care | Payer: Medicaid Other | Attending: Emergency Medicine | Admitting: Emergency Medicine

## 2014-06-21 ENCOUNTER — Encounter (HOSPITAL_COMMUNITY): Payer: Self-pay | Admitting: Emergency Medicine

## 2014-06-21 DIAGNOSIS — F17200 Nicotine dependence, unspecified, uncomplicated: Secondary | ICD-10-CM | POA: Insufficient documentation

## 2014-06-21 DIAGNOSIS — Z5900 Homelessness unspecified: Secondary | ICD-10-CM | POA: Insufficient documentation

## 2014-06-21 DIAGNOSIS — Z79899 Other long term (current) drug therapy: Secondary | ICD-10-CM | POA: Diagnosis not present

## 2014-06-21 DIAGNOSIS — Z8659 Personal history of other mental and behavioral disorders: Secondary | ICD-10-CM | POA: Diagnosis not present

## 2014-06-21 DIAGNOSIS — I517 Cardiomegaly: Secondary | ICD-10-CM | POA: Diagnosis not present

## 2014-06-21 DIAGNOSIS — G8929 Other chronic pain: Secondary | ICD-10-CM | POA: Diagnosis not present

## 2014-06-21 DIAGNOSIS — M79609 Pain in unspecified limb: Secondary | ICD-10-CM | POA: Insufficient documentation

## 2014-06-21 DIAGNOSIS — M79601 Pain in right arm: Secondary | ICD-10-CM

## 2014-06-21 DIAGNOSIS — R079 Chest pain, unspecified: Secondary | ICD-10-CM | POA: Diagnosis present

## 2014-06-21 DIAGNOSIS — IMO0002 Reserved for concepts with insufficient information to code with codable children: Secondary | ICD-10-CM | POA: Insufficient documentation

## 2014-06-21 DIAGNOSIS — Z862 Personal history of diseases of the blood and blood-forming organs and certain disorders involving the immune mechanism: Secondary | ICD-10-CM | POA: Diagnosis not present

## 2014-06-21 DIAGNOSIS — M79604 Pain in right leg: Secondary | ICD-10-CM

## 2014-06-21 LAB — D-DIMER, QUANTITATIVE: D-Dimer, Quant: 0.4 ug/mL-FEU (ref 0.00–0.48)

## 2014-06-21 LAB — I-STAT TROPONIN, ED: TROPONIN I, POC: 0 ng/mL (ref 0.00–0.08)

## 2014-06-21 MED ORDER — ASPIRIN 81 MG PO CHEW
324.0000 mg | CHEWABLE_TABLET | Freq: Once | ORAL | Status: AC
  Administered 2014-06-21: 324 mg via ORAL
  Filled 2014-06-21: qty 4

## 2014-06-21 MED ORDER — NITROGLYCERIN 0.4 MG SL SUBL: 0.4000 mg | SUBLINGUAL_TABLET | SUBLINGUAL | Status: DC | PRN

## 2014-06-21 MED ORDER — KETOROLAC TROMETHAMINE 30 MG/ML IJ SOLN
30.0000 mg | Freq: Once | INTRAMUSCULAR | Status: AC
  Administered 2014-06-21: 30 mg via INTRAVENOUS
  Filled 2014-06-21: qty 1

## 2014-06-21 NOTE — ED Notes (Signed)
Pt c/o chronic intermittent CP x years, sts that it worsens with exertion and typically gets better with Nitro. Has been seen by PCP for this and here last night for the same. Has had a stress test recently. Pt sts she is here more for her right hip pain that radiates down posterior thigh and into lower leg x 6 weeks. Has been seen by pcp for there and had a negative doppler scan. Nad, skin warm and dry, resp e/u.

## 2014-06-21 NOTE — ED Notes (Signed)
The patient has been having chest pain for about four nights and she is also complaining of leg pain.   The patient says both the chest pain and the leg pain are chronic and she has been diagnosed with some condition that causes her chest pain.  The leg pain is mainly why she came in.

## 2014-06-21 NOTE — ED Provider Notes (Signed)
TIME SEEN: 7:20 AM  CHIEF COMPLAINT: Intermittent chest pain for 2 years but has been constant for the past 3 days, right leg pain for 9 weeks  HPI: Pt is a 43 y.o. M with history of substance abuse, homelessness, bipolar disorder who presents to the emergency department with complaints of intermittent squeezing-like left-sided chest pain with associated numbness in her left arm that is worse with exertion and better with nitroglycerin she has had for the past 2 years. She had a stress test at that time that was negative. She states she is followed by a leg genetics family medicine for this. She denies that it is any worse than normal. She does not have any more nitroglycerin. She has no shortness of breath, nausea vomiting, diaphoresis or dizziness. She's also complaining of right leg pain that she describes as an aching pain is worse with standing and movement has been present for the past 9 weeks. She denies any back pain, new numbness or tingling, weakness. No history of injury to her leg. She had a negative venous Doppler June 2015. No history of fever or rash. No bowel or bladder incontinence. No urinary retention.   ROS: See HPI Constitutional: no fever  Eyes: no drainage  ENT: no runny nose   Cardiovascular:  chest pain  Resp: no SOB  GI: no vomiting GU: no dysuria Integumentary: no rash  Allergy: no hives  Musculoskeletal: no leg swelling  Neurological: no slurred speech ROS otherwise negative  PAST MEDICAL HISTORY/PAST SURGICAL HISTORY:  Past Medical History  Diagnosis Date  . Palpitations   . Psychosis   . BIPOLAR DISORDER   . Substance abuse   . Delusions   . Homelessness   . Left ventricular hypertrophy   . Sickle cell trait   . Homelessness     MEDICATIONS:  Prior to Admission medications   Medication Sig Start Date End Date Taking? Authorizing Provider  diclofenac (VOLTAREN) 75 MG EC tablet Take 75 mg by mouth 2 (two) times daily.    Historical Provider, MD   diphenhydrAMINE (BENADRYL) 25 MG tablet Take 1 tablet (25 mg total) by mouth every 6 (six) hours. 05/24/14   Noland Fordyce, PA-C  hydrocortisone cream 1 % Apply 1 application topically 2 (two) times daily. 05/24/14   Noland Fordyce, PA-C  ibuprofen (ADVIL,MOTRIN) 600 MG tablet Take 600 mg by mouth every 6 (six) hours as needed (pain).     Historical Provider, MD  LORazepam (ATIVAN) 0.5 MG tablet Take 0.5 mg by mouth every 8 (eight) hours as needed for anxiety.     Historical Provider, MD  nitroGLYCERIN (NITROSTAT) 0.4 MG SL tablet Place 1 tablet (0.4 mg total) under the tongue every 5 (five) minutes as needed for chest pain. 05/24/13   Nicole Pisciotta, PA-C  permethrin (ELIMITE) 5 % cream Apply cream from chin to toes, allow to sit for 8-10 hours, then wash off with soap and water 05/24/14   Noland Fordyce, PA-C    ALLERGIES:  Allergies  Allergen Reactions  . Strawberry Anaphylaxis    SOCIAL HISTORY:  History  Substance Use Topics  . Smoking status: Current Every Day Smoker -- 1.00 packs/day for 10 years    Types: Cigarettes  . Smokeless tobacco: Not on file  . Alcohol Use: No    FAMILY HISTORY: Family History  Problem Relation Age of Onset  . Heart disease Mother     CHF    EXAM: BP 107/79  Pulse 59  Temp(Src) 97.7 F (36.5  C) (Oral)  Resp 18  SpO2 99%  LMP 05/19/2014 CONSTITUTIONAL: Alert and oriented and responds appropriately to questions. Well-appearing; well-nourished HEAD: Normocephalic EYES: Conjunctivae clear, PERRL ENT: normal nose; no rhinorrhea; moist mucous membranes; pharynx without lesions noted NECK: Supple, no meningismus, no LAD  CARD: RRR; S1 and S2 appreciated; no murmurs, no clicks, no rubs, no gallops RESP: Normal chest excursion without splinting or tachypnea; breath sounds clear and equal bilaterally; no wheezes, no rhonchi, no rales,  ABD/GI: Normal bowel sounds; non-distended; soft, non-tender, no rebound, no guarding BACK:  The back appears normal  and is non-tender to palpation, there is no CVA tenderness, no midline spinal tenderness or step-off or deformity EXT: Patient she reports she is tender everywhere I palpate over her right leg but there is no swelling or bony deformity or ecchymosis or lesions, no joint effusions, 2+ DP pulses bilaterally, sensation to light touch intact diffusely, Normal ROM in all joints; otherwise extremities are non-tender to palpation; no edema; normal capillary refill; no cyanosis    SKIN: Normal color for age and race; warm NEURO: Moves all extremities equally PSYCH: The patient's mood and manner are appropriate. Grooming and personal hygiene are appropriate.  MEDICAL DECISION MAKING: Patient here with chronic complaints of chest pain and right lower extremity pain. She was here last night but did not stay for evaluation with a physician. Her labs are unremarkable complete and negative troponin and a clear chest x-ray. Unclear etiology for patient's symptoms. Her heart score is 2. EKG does not show any changes compared to 2012. We'll repeat troponin and obtain a d-dimer. We'll give nitroglycerin, aspirin for her chest pain and Toradol for her leg pain. Unclear etiology for patient's leg pain but I do not feel that she has any sign of infection, less likely deep vein thrombosis given her recent negative Doppler, it does not appear to be coming from her back and she has no back pain or neurologic deficit. She's been seen several times in the emergency department for the same. She states she wants something for the pain to help her sleep.  ED PROGRESS: Repeat troponin is negative. D-dimer also negative. Attempted to reassure patient I do not feel there is any life-threatening illness that is causing her leg pain and that given he has been present for 9 weeks, she needs to be followed with her primary care physician for further management for this. Recommended anti-inflammatories; patient refuses prescription. Have  discussed her chest pain at length. Discussed with her that I do not think this is a pulmonary embolus or dissection given we have a negative d-dimer. She does however have risk factors for ACS.  Heart score however is low at 2. Patient does not want admission. Have discussed with her recommended outpatient stress test. Will get cardiology followup information. Have discussed return precautions. Patient verbalizes understanding and is comfortable plan.     EKG Interpretation  Date/Time:  Friday June 21 2014 06:46:44 EDT Ventricular Rate:  71 PR Interval:  166 QRS Duration: 74 QT Interval:  422 QTC Calculation: 458 R Axis:   57 Text Interpretation:  Normal sinus rhythm Possible Left atrial enlargement Cannot rule out Anterior infarct , age undetermined Abnormal ECG Unchanged compared to EKG 2012 Confirmed by Dashan Chizmar,  DO, Miyako Oelke [phone]) on 06/21/2014 7:49:59 AM         [place], DO 06/21/14 [id number]

## 2014-06-21 NOTE — Discharge Instructions (Signed)
Chest Pain (Nonspecific) °It is often hard to give a specific diagnosis for the cause of chest pain. There is always a chance that your pain could be related to something serious, such as a heart attack or a blood clot in the lungs. You need to follow up with your health care provider for further evaluation. °CAUSES  °· Heartburn. °· Pneumonia or bronchitis. °· Anxiety or stress. °· Inflammation around your heart (pericarditis) or lung (pleuritis or pleurisy). °· A blood clot in the lung. °· A collapsed lung (pneumothorax). It can develop suddenly on its own (spontaneous pneumothorax) or from trauma to the chest. °· Shingles infection (herpes zoster virus). °The chest wall is composed of bones, muscles, and cartilage. Any of these can be the source of the pain. °· The bones can be bruised by injury. °· The muscles or cartilage can be strained by coughing or overwork. °· The cartilage can be affected by inflammation and become sore (costochondritis). °DIAGNOSIS  °Lab tests or other studies may be needed to find the cause of your pain. Your health care provider may have you take a test called an ambulatory electrocardiogram (ECG). An ECG records your heartbeat patterns over a 24-hour period. You may also have other tests, such as: °· Transthoracic echocardiogram (TTE). During echocardiography, sound waves are used to evaluate how blood flows through your heart. °· Transesophageal echocardiogram (TEE). °· Cardiac monitoring. This allows your health care provider to monitor your heart rate and rhythm in real time. °· Holter monitor. This is a portable device that records your heartbeat and can help diagnose heart arrhythmias. It allows your health care provider to track your heart activity for several days, if needed. °· Stress tests by exercise or by giving medicine that makes the heart beat faster. °TREATMENT  °· Treatment depends on what may be causing your chest pain. Treatment may include: °¨ Acid blockers for  heartburn. °¨ Anti-inflammatory medicine. °¨ Pain medicine for inflammatory conditions. °¨ Antibiotics if an infection is present. °· You may be advised to change lifestyle habits. This includes stopping smoking and avoiding alcohol, caffeine, and chocolate. °· You may be advised to keep your head raised (elevated) when sleeping. This reduces the chance of acid going backward from your stomach into your esophagus. °Most of the time, nonspecific chest pain will improve within 2-3 days with rest and mild pain medicine.  °HOME CARE INSTRUCTIONS  °· If antibiotics were prescribed, take them as directed. Finish them even if you start to feel better. °· For the next few days, avoid physical activities that bring on chest pain. Continue physical activities as directed. °· Do not use any tobacco products, including cigarettes, chewing tobacco, or electronic cigarettes. °· Avoid drinking alcohol. °· Only take medicine as directed by your health care provider. °· Follow your health care provider's suggestions for further testing if your chest pain does not go away. °· Keep any follow-up appointments you made. If you do not go to an appointment, you could develop lasting (chronic) problems with pain. If there is any problem keeping an appointment, call to reschedule. °SEEK MEDICAL CARE IF:  °· Your chest pain does not go away, even after treatment. °· You have a rash with blisters on your chest. °· You have a fever. °SEEK IMMEDIATE MEDICAL CARE IF:  °· You have increased chest pain or pain that spreads to your arm, neck, jaw, back, or abdomen. °· You have shortness of breath. °· You have an increasing cough, or you cough   up blood.  You have severe back or abdominal pain.  You feel nauseous or vomit.  You have severe weakness.  You faint.  You have chills. This is an emergency. Do not wait to see if the pain will go away. Get medical help at once. Call your local emergency services (911 in [place] Do not drive  yourself to the hospital. MAKE SURE YOU:   Understand these instructions.  Will watch your condition.  Will get help right away if you are not doing well or get worse. Document Released: 07/28/2005 Document Revised: 10/23/2013 Document Reviewed: 05/23/2008 [facility] Patient Information 2015 [place], [place]. This information is not intended to replace advice given to you by your health care provider. Make sure you discuss any questions you have with your health care provider.  Musculoskeletal Pain Musculoskeletal pain is muscle and boney aches and pains. These pains can occur in any part of the body. Your caregiver may treat you without knowing the cause of the pain. They may treat you if blood or urine tests, X-rays, and other tests were normal.  CAUSES There is often not a definite cause or reason for these pains. These pains may be caused by a type of germ (virus). The discomfort may also come from overuse. Overuse includes working out too hard when your body is not fit. Boney aches also come from weather changes. Bone is sensitive to atmospheric pressure changes. HOME CARE INSTRUCTIONS   Ask when your test results will be ready. Make sure you get your test results.  Only take over-the-counter or prescription medicines for pain, discomfort, or fever as directed by your caregiver. If you were given medications for your condition, do not drive, operate machinery or power tools, or sign legal documents for 24 hours. Do not drink alcohol. Do not take sleeping pills or other medications that may interfere with treatment.  Continue all activities unless the activities cause more pain. When the pain lessens, slowly resume normal activities. Gradually increase the intensity and duration of the activities or exercise.  During periods of severe pain, bed rest may be helpful. Lay or sit in any position that is comfortable.  Putting ice on the injured area.  Put ice in a bag.  Place a towel between  your skin and the bag.  Leave the ice on for 15 to 20 minutes, 3 to 4 times a day.  Follow up with your caregiver for continued problems and no reason can be found for the pain. If the pain becomes worse or does not go away, it may be necessary to repeat tests or do additional testing. Your caregiver may need to look further for a possible cause. SEEK IMMEDIATE MEDICAL CARE IF:  You have pain that is getting worse and is not relieved by medications.  You develop chest pain that is associated with shortness or breath, sweating, feeling sick to your stomach (nauseous), or throw up (vomit).  Your pain becomes localized to the abdomen.  You develop any new symptoms that seem different or that concern you. MAKE SURE YOU:   Understand these instructions.  Will watch your condition.  Will get help right away if you are not doing well or get worse. Document Released: 10/18/2005 Document Revised: 01/10/2012 Document Reviewed: 06/22/2013 [facility] Patient Information 2015 [place], [place]. This information is not intended to replace advice given to you by your health care provider. Make sure you discuss any questions you have with your health care provider.  Chronic Pain Chronic pain can  be defined as pain that is off and on and lasts for 3-6 months or longer. Many things cause chronic pain, which can make it difficult to make a diagnosis. There are many treatment options available for chronic pain. However, finding a treatment that works well for you may require trying various approaches until the right one is found. Many people benefit from a combination of two or more types of treatment to control their pain. SYMPTOMS  Chronic pain can occur anywhere in the body and can range from mild to very severe. Some types of chronic pain include:  Headache.  Low back pain.  Cancer pain.  Arthritis pain.  Neurogenic pain. This is pain resulting from damage to nerves. People with chronic pain may  also have other symptoms such as:  Depression.  Anger.  Insomnia.  Anxiety. DIAGNOSIS  Your health care provider will help diagnose your condition over time. In many cases, the initial focus will be on excluding possible conditions that could be causing the pain. Depending on your symptoms, your health care provider may order tests to diagnose your condition. Some of these tests may include:   Blood tests.   CT scan.   MRI.   X-rays.   Ultrasounds.   Nerve conduction studies.  You may need to see a specialist.  TREATMENT  Finding treatment that works well may take time. You may be referred to a pain specialist. He or she may prescribe medicine or therapies, such as:   Mindful meditation or yoga.  Shots (injections) of numbing or pain-relieving medicines into the spine or area of pain.  Local electrical stimulation.  Acupuncture.   Massage therapy.   Aroma, color, light, or sound therapy.   Biofeedback.   Working with a physical therapist to keep from getting stiff.   Regular, gentle exercise.   Cognitive or behavioral therapy.   Group support.  Sometimes, surgery may be recommended.  HOME CARE INSTRUCTIONS   Take all medicines as directed by your health care provider.   Lessen stress in your life by relaxing and doing things such as listening to calming music.   Exercise or be active as directed by your health care provider.   Eat a healthy diet and include things such as vegetables, fruits, fish, and lean meats in your diet.   Keep all follow-up appointments with your health care provider.   Attend a support group with others suffering from chronic pain. SEEK MEDICAL CARE IF:   Your pain gets worse.   You develop a new pain that was not there before.   You cannot tolerate medicines given to you by your health care provider.   You have new symptoms since your last visit with your health care provider.  SEEK IMMEDIATE  MEDICAL CARE IF:   You feel weak.   You have decreased sensation or numbness.   You lose control of bowel or bladder function.   Your pain suddenly gets much worse.   You develop shaking.  You develop chills.  You develop confusion.  You develop chest pain.  You develop shortness of breath.  MAKE SURE YOU:  Understand these instructions.  Will watch your condition.  Will get help right away if you are not doing well or get worse. Document Released: 07/10/2002 Document Revised: 06/20/2013 Document Reviewed: 04/13/2013 [facility] Patient Information 2015 [place], [place]. This information is not intended to replace advice given to you by your health care provider. Make sure you discuss any questions you have with  your health care provider.

## 2014-06-22 ENCOUNTER — Encounter (HOSPITAL_COMMUNITY): Payer: Self-pay | Admitting: Emergency Medicine

## 2014-06-22 ENCOUNTER — Emergency Department (HOSPITAL_COMMUNITY): Payer: Medicaid Other

## 2014-06-22 ENCOUNTER — Emergency Department (HOSPITAL_COMMUNITY)
Admission: EM | Admit: 2014-06-22 | Discharge: 2014-06-22 | Disposition: A | Discharge status: Home / Self Care | Payer: Medicaid Other | Source: Home / Self Care | Attending: Emergency Medicine | Admitting: Emergency Medicine

## 2014-06-22 ENCOUNTER — Emergency Department (HOSPITAL_COMMUNITY)
Admission: EM | Admit: 2014-06-22 | Discharge: 2014-06-22 | Disposition: A | Discharge status: Home / Self Care | Payer: Medicaid Other | Attending: Emergency Medicine | Admitting: Emergency Medicine

## 2014-06-22 DIAGNOSIS — M799 Soft tissue disorder, unspecified: Secondary | ICD-10-CM | POA: Insufficient documentation

## 2014-06-22 DIAGNOSIS — I517 Cardiomegaly: Secondary | ICD-10-CM | POA: Diagnosis not present

## 2014-06-22 DIAGNOSIS — R609 Edema, unspecified: Secondary | ICD-10-CM | POA: Diagnosis not present

## 2014-06-22 DIAGNOSIS — F17200 Nicotine dependence, unspecified, uncomplicated: Secondary | ICD-10-CM | POA: Insufficient documentation

## 2014-06-22 DIAGNOSIS — Z5900 Homelessness unspecified: Secondary | ICD-10-CM | POA: Insufficient documentation

## 2014-06-22 DIAGNOSIS — Z79899 Other long term (current) drug therapy: Secondary | ICD-10-CM | POA: Insufficient documentation

## 2014-06-22 DIAGNOSIS — Z862 Personal history of diseases of the blood and blood-forming organs and certain disorders involving the immune mechanism: Secondary | ICD-10-CM | POA: Insufficient documentation

## 2014-06-22 DIAGNOSIS — Z8659 Personal history of other mental and behavioral disorders: Secondary | ICD-10-CM | POA: Insufficient documentation

## 2014-06-22 DIAGNOSIS — R079 Chest pain, unspecified: Secondary | ICD-10-CM | POA: Diagnosis not present

## 2014-06-22 DIAGNOSIS — M25579 Pain in unspecified ankle and joints of unspecified foot: Secondary | ICD-10-CM | POA: Diagnosis present

## 2014-06-22 DIAGNOSIS — Z791 Long term (current) use of non-steroidal anti-inflammatories (NSAID): Secondary | ICD-10-CM | POA: Diagnosis not present

## 2014-06-22 DIAGNOSIS — Z3202 Encounter for pregnancy test, result negative: Secondary | ICD-10-CM | POA: Diagnosis not present

## 2014-06-22 DIAGNOSIS — S96919S Strain of unspecified muscle and tendon at ankle and foot level, unspecified foot, sequela: Secondary | ICD-10-CM

## 2014-06-22 DIAGNOSIS — M7989 Other specified soft tissue disorders: Secondary | ICD-10-CM | POA: Insufficient documentation

## 2014-06-22 LAB — BASIC METABOLIC PANEL
Anion gap: 11 (ref 5–15)
BUN: 8 mg/dL (ref 6–23)
CO2: 26 mEq/L (ref 19–32)
CREATININE: 0.8 mg/dL (ref 0.50–1.10)
Calcium: 8.8 mg/dL (ref 8.4–10.5)
Chloride: 104 mEq/L (ref 96–112)
GFR calc Af Amer: >90 mL/min (ref >90)
GFR calc non Af Amer: 89 mL/min — ABNORMAL LOW (ref >90)
GLUCOSE: 82 mg/dL (ref 70–99)
POTASSIUM: 3.8 meq/L (ref 3.7–5.3)
Sodium: 141 mEq/L (ref 137–147)

## 2014-06-22 LAB — CBC
HEMATOCRIT: 34.1 % — AB (ref 36.0–46.0)
HEMOGLOBIN: 12 g/dL (ref 12.0–15.0)
MCH: 28.8 pg (ref 26.0–34.0)
MCHC: 35.2 g/dL (ref 30.0–36.0)
MCV: 82 fL (ref 78.0–100.0)
Platelets: 200 10*3/uL (ref 150–400)
RBC: 4.16 MIL/uL (ref 3.87–5.11)
RDW: 13.3 % (ref 11.5–15.5)
WBC: 5.1 10*3/uL (ref 4.0–10.5)

## 2014-06-22 LAB — PRO B NATRIURETIC PEPTIDE: Pro B Natriuretic peptide (BNP): 73.4 pg/mL (ref 0–125)

## 2014-06-22 LAB — POC URINE PREG, ED: PREG TEST UR: NEGATIVE

## 2014-06-22 MED ORDER — NAPROXEN 500 MG PO TABS: 500.0000 mg | ORAL_TABLET | Freq: Two times a day (BID) | ORAL | Status: DC

## 2014-06-22 MED ORDER — NAPROXEN 250 MG PO TABS
500.0000 mg | ORAL_TABLET | Freq: Once | ORAL | Status: AC
  Administered 2014-06-22: 500 mg via ORAL
  Filled 2014-06-22: qty 2

## 2014-06-22 MED ORDER — FUROSEMIDE 20 MG PO TABS: 20.0000 mg | ORAL_TABLET | Freq: Every day | ORAL | Status: DC

## 2014-06-22 MED ORDER — FUROSEMIDE 20 MG PO TABS
20.0000 mg | ORAL_TABLET | Freq: Once | ORAL | Status: AC
  Administered 2014-06-22: 20 mg via ORAL
  Filled 2014-06-22: qty 1

## 2014-06-22 NOTE — ED Provider Notes (Signed)
CSN: [id number]     Arrival date & time 06/22/14  0051 History   First MD Initiated Contact with Patient 06/22/14 0606     Chief Complaint  Patient presents with  . Foot Swelling     (Consider location/radiation/quality/duration/timing/severity/associated sxs/prior Treatment) HPI Anita Hill is a 43 y.o. female with a history of bipolar disorder, delusions, psychosis homelessness who is here for evaluation of bilateral foot swelling. She states swelling began yesterday evening. She reports walking a lot more yesterday than she has in the past 3 months. She reports slight pain in her right leg that has been present for the past 9 weeks. She states she had a sonogram done here in the ED last week and they found 3 blood clots. Nothing makes the pain better or worse. She denies any fevers, numbness or weakness, loss of bowel or bladder function. She denies cough or hemoptysis, and no pain with inspiration. She does report that she forgets to breathe sometimes. She was seen here yesterday for the same leg pain and found to have a negative workup. She is a current cigarette smoker at one pack a day for the past 10 years.  Past Medical History  Diagnosis Date  . Palpitations   . Psychosis   . BIPOLAR DISORDER   . Substance abuse   . Delusions   . Homelessness   . Left ventricular hypertrophy   . Sickle cell trait   . Homelessness    Past Surgical History  Procedure Laterality Date  . Bunionectomy     Family History  Problem Relation Age of Onset  . Heart disease Mother     CHF   History  Substance Use Topics  . Smoking status: Current Every Day Smoker -- 1.00 packs/day for 10 years    Types: Cigarettes  . Smokeless tobacco: Not on file  . Alcohol Use: No   OB History   Grav Para Term Preterm Abortions TAB SAB Ect Mult Living   1              Review of Systems  Constitutional: Negative for fever.  Respiratory: Negative for cough.   Cardiovascular: Negative for chest  pain.  Endocrine: Negative for polyuria.  Genitourinary: Negative for urgency.  Musculoskeletal: Positive for arthralgias and joint swelling.  Skin: Negative for rash.  Neurological: Negative for weakness and numbness.      Allergies  Strawberry  Home Medications   Prior to Admission medications   Medication Sig Start Date End Date Taking? Authorizing Provider  nitroGLYCERIN (NITROSTAT) 0.4 MG SL tablet Place 1 tablet (0.4 mg total) under the tongue every 5 (five) minutes as needed for chest pain. 05/24/13   Nicole Pisciotta, PA-C   BP 115/60  Pulse 68  Temp(Src) 98.4 F (36.9 C) (Oral)  Resp 18  SpO2 100%  LMP 05/19/2014 Physical Exam  Nursing note and vitals reviewed. Constitutional: She appears well-developed and well-nourished. No distress.  HENT:  Head: Normocephalic and atraumatic.  Eyes: Conjunctivae and EOM are normal. Right eye exhibits no discharge. Left eye exhibits no discharge.  Neck: Normal range of motion.  Cardiovascular: Normal rate, regular rhythm and normal heart sounds.   Pulmonary/Chest: Effort normal and breath sounds normal. No respiratory distress.  Musculoskeletal: Normal range of motion.  Mild diffuse swelling bilaterally in feet, full ROM intact. Nontender.  No local inflammation or edema to joint capsule. No lesions, ecchymosis or lacerations to ankles. DP intact and 2+ bilaterally.   No swelling, ecchymosis, lesions,  tenderness or deformity appreciated in either leg above the ankle.  Neurological:  Neurovascularly intact.  Skin: Skin is warm and dry. No rash noted. She is not diaphoretic. No erythema.    ED Course  Procedures (including critical care time) Labs Review Labs Reviewed - No data to display  Imaging Review No results found.   EKG Interpretation None     Meds given in ED:  Medications - No data to display  New Prescriptions   No medications on file   Filed Vitals:   06/22/14 0352  BP: 115/60  Pulse: 68  Temp:  98.4 F (36.9 C)  Resp: 18    MDM  Vitals stable  And WNL- afebrile - O2 Sats 100% Pt resting comfortably and talking on phone in room w/o any difficulty breathing. No history of [place] or sonogram done last week. Last LE Venous Doppler done in June 2015 and was unremarkable. D-Dimer done yesterday was negative DVT, PE less likely. No evidence of hemarthrosis or septic joint. Neurovascularly intact, Full ROM. Likely swelling due to overuse. Recommend RICE therapy with NSAIDS. F/u with PCP for further evaluation and management. Discussed return precautions.  Final diagnoses:  None  Prior to patient discharge, I discussed and reviewed this case with Dr.Otter         Verl Dicker, PA-C 06/22/14 [phone]

## 2014-06-22 NOTE — ED Notes (Signed)
Legs and feet have +2 edema noted on inspection.  Medium ted hose ordered from main supply.

## 2014-06-22 NOTE — ED Notes (Signed)
Patient finally got dressed and ready for discharge and began to holler about how terrible [facility] is.  Patient walked out of the ED into the waiting area still fussing and hollering and walked back toward the inpatient side of the hospital.  I followed patient and she eventually exited the Hospital and went toward [facility].

## 2014-06-22 NOTE — ED Notes (Signed)
Patient transported to X-ray 

## 2014-06-22 NOTE — ED Notes (Signed)
Pt here for right leg pain and bilateral leg swelling x 3 days.  Intermittant chest pain in last 3 days, none today.

## 2014-06-22 NOTE — ED Notes (Signed)
Patient decided to apply ted hose bilaterally herself.  Verified they were applied correctly.  Provided sandwich and drink as requested. Provided wheelchair for discharge.

## 2014-06-22 NOTE — Discharge Instructions (Signed)
1. Medications: Lasix, usual home medications 2. Treatment: rest, drink plenty of fluids, where your compression stockings 3. Follow Up: Please followup with your primary doctor for discussion of your diagnoses and further evaluation after today's visit; please also followup with Dr. Mar Daring within one week for further evaluation of your chest pain   Peripheral Edema You have swelling in your legs (peripheral edema). This swelling is due to excess accumulation of salt and water in your body. Edema may be a sign of heart, kidney or liver disease, or a side effect of a medication. It may also be due to problems in the leg veins. Elevating your legs and using special support stockings may be very helpful, if the cause of the swelling is due to poor venous circulation. Avoid long periods of standing, whatever the cause. Treatment of edema depends on identifying the cause. Chips, pretzels, pickles and other salty foods should be avoided. Restricting salt in your diet is almost always needed. Water pills (diuretics) are often used to remove the excess salt and water from your body via urine. These medicines prevent the kidney from reabsorbing sodium. This increases urine flow. Diuretic treatment may also result in lowering of potassium levels in your body. Potassium supplements may be needed if you have to use diuretics daily. Daily weights can help you keep track of your progress in clearing your edema. You should call your caregiver for follow up care as recommended. SEEK IMMEDIATE MEDICAL CARE IF:   You have increased swelling, pain, redness, or heat in your legs.  You develop shortness of breath, especially when lying down.  You develop chest or abdominal pain, weakness, or fainting.  You have a fever. Document Released: 11/25/2004 Document Revised: 01/10/2012 Document Reviewed: 11/05/2009 [facility] Patient Information 2015 [place], [place]. This information is not intended to replace advice given to  you by your health care provider. Make sure you discuss any questions you have with your health care provider.

## 2014-06-22 NOTE — ED Notes (Signed)
Jarrett Soho, PA-C is at the bedside.

## 2014-06-22 NOTE — ED Notes (Signed)
Called pt. X 2, pt. In the bathroom.

## 2014-06-22 NOTE — ED Notes (Signed)
Presents with bilateral foot swelling began this evening associated with numbness and tingling to toes. She was seen here yesterday AM and at PCP for palpitations. Given scripts by PCP and here-has not filled RX. She denies swelling to feet this AM.

## 2014-06-22 NOTE — ED Provider Notes (Signed)
Medical screening examination/treatment/procedure(s) were performed by non-physician practitioner and as supervising physician I was immediately available for consultation/collaboration.   EKG Interpretation   Date/Time:  Saturday June 22 2014 16:53:31 EDT Ventricular Rate:  49 PR Interval:  176 QRS Duration: 76 QT Interval:  470 QTC Calculation: 424 R Axis:   68 Text Interpretation:  Sinus bradycardia with sinus arrhythmia Septal  infarct , age undetermined Abnormal ECG No significant change since last  tracing Confirmed by Christy Gentles  MD, Cherryl Babin ([id number]) on 06/22/2014 7:02:40 PM        Sharyon Cable, MD 06/22/14 2314

## 2014-06-22 NOTE — ED Notes (Signed)
Pt. Stated, My feet started swelling yesterday, the right more than the right.

## 2014-06-22 NOTE — ED Provider Notes (Signed)
Patient seen/examined in the Emergency Department in conjunction with Midlevel Provider  Patient reports multiple complaints ,currently having bilateral LE edema Exam : awake/alert, symmetric edema to bilateral LE noted Plan: stable for d/c home.  No active CP at this time.  She has had recent extensive workup   Sharyon Cable, MD 06/22/14 1910

## 2014-06-22 NOTE — ED Notes (Signed)
Patient refused to be discharged.  Charge nurse Gabriel Cirri went in to speak with patient.  Patient offered a bus pass.

## 2014-06-22 NOTE — ED Notes (Addendum)
Attempted to apply ted hose bilaterally, patient reports she can't because "her jeans will be too tight to wear".  Explained how to apply, how to care for legs, and how it also works in addition with lasix.

## 2014-06-22 NOTE — ED Provider Notes (Signed)
CSN: [id number]     Arrival date & time 06/22/14  1328 History  This chart was scribed for non-physician practitioner working with Sharyon Cable, MD, by Jeanell Sparrow, ED Scribe. This patient was seen in room TR09C/TR09C and the patient's care was started at 4:32 PM.   Chief Complaint  Patient presents with  . Foot Pain   The history is provided by the patient and medical records. No language interpreter was used.   HPI Comments: Anita Hill is a 43 y.o. female who presents to the Emergency Department complaining of right foot pain that started 3 days ago. She reports that she also noticed bilateral leg swelling also beginning 3 days ago and persisting. She reports that she also had intermittent chest pain 3 days ago, not present at this time. She describes the chest pain as a sharp pain in the right leg pain as aching. She reports that she has difficulty ambulating due to foot and leg pain. She states that she went to the doctor's office this morning and was told that she does not have a blood clot. She reports prior hx of leg swellling and a cancer biopsy of the right foot.  Review shows the patient was seen yesterday and then again today for leg swelling and chest pain. Her troponin has been negative both times and her EKG has been nonischemic patient had a venous duplex of the right leg on 04/17/2012 which showed no blood clots. Previous CT angiogram has also been without pulmonary embolism.    Past Medical History  Diagnosis Date  . Palpitations   . Psychosis   . BIPOLAR DISORDER   . Substance abuse   . Delusions   . Homelessness   . Left ventricular hypertrophy   . Sickle cell trait   . Homelessness    Past Surgical History  Procedure Laterality Date  . Bunionectomy     Family History  Problem Relation Age of Onset  . Heart disease Mother     CHF   History  Substance Use Topics  . Smoking status: Current Every Day Smoker -- 1.00 packs/day for 10 years    Types:  Cigarettes  . Smokeless tobacco: Not on file  . Alcohol Use: No   OB History   Grav Para Term Preterm Abortions TAB SAB Ect Mult Living   1              Review of Systems  Constitutional: Negative for fever, diaphoresis, appetite change, fatigue and unexpected weight change.  HENT: Negative for mouth sores.   Eyes: Negative for visual disturbance.  Respiratory: Negative for cough, chest tightness, shortness of breath and wheezing.   Cardiovascular: Positive for chest pain and leg swelling.  Gastrointestinal: Negative for nausea, vomiting, abdominal pain, diarrhea and constipation.  Endocrine: Negative for polydipsia, polyphagia and polyuria.  Genitourinary: Negative for dysuria, urgency, frequency and hematuria.  Musculoskeletal: Negative for back pain and neck stiffness.  Skin: Negative for rash.  Allergic/Immunologic: Negative for immunocompromised state.  Neurological: Negative for syncope, light-headedness and headaches.  Hematological: Does not bruise/bleed easily.  Psychiatric/Behavioral: Negative for sleep disturbance. The patient is not nervous/anxious.     Allergies  Strawberry  Home Medications   Prior to Admission medications   Medication Sig Start Date End Date Taking? Authorizing Provider  naproxen (NAPROSYN) 500 MG tablet Take 500 mg by mouth 2 (two) times daily with a meal.   Yes Historical Provider, MD  nitroGLYCERIN (NITROSTAT) 0.4 MG SL tablet Place  0.4 mg under the tongue every 5 (five) minutes as needed for chest pain.   Yes Historical Provider, MD  furosemide (LASIX) 20 MG tablet Take 1 tablet (20 mg total) by mouth daily. 06/22/14   Ryott Rafferty, PA-C   BP 100/60  Pulse 64  Temp(Src) 98.4 F (36.9 C) (Oral)  Resp 16  SpO2 99%  LMP 05/19/2014 Physical Exam  Nursing note and vitals reviewed. Constitutional: She is oriented to person, place, and time. She appears well-developed and well-nourished. No distress.  Awake, alert, nontoxic appearance   HENT:  Head: Normocephalic and atraumatic.  Mouth/Throat: Oropharynx is clear and moist. No oropharyngeal exudate.  Eyes: Conjunctivae are normal. No scleral icterus.  Neck: Normal range of motion. Neck supple.  Cardiovascular: Normal rate, regular rhythm, normal heart sounds and intact distal pulses.   No murmur heard. Irregular rate and rhythm No tachycardia  Pulmonary/Chest: Effort normal and breath sounds normal. No respiratory distress. She has no wheezes.  Equal chest expansion Clear and equal breath sounds without rails or rhonchi  Abdominal: Soft. Bowel sounds are normal. She exhibits no mass. There is no tenderness. There is no rebound and no guarding.  Soft and nontender  Musculoskeletal: Normal range of motion. She exhibits edema.  Significant 2+ pitting edema beginning in the toes and extending upward to the mid calf; tenderness to palpation of the right leg but bilateral pitting edema No palpable cord, no erythema to suggest cellulitis  Lymphadenopathy:    She has no cervical adenopathy.  Neurological: She is alert and oriented to person, place, and time. She exhibits normal muscle tone. Coordination normal.  Speech is clear and goal oriented Moves extremities without ataxia  Skin: Skin is warm and dry. She is not diaphoretic. No erythema.  Psychiatric: She has a normal mood and affect.    ED Course  Procedures (including critical care time) DIAGNOSTIC STUDIES: Oxygen Saturation is 99% on room air, normal by my interpretation.    COORDINATION OF CARE: 4:36 PM- Pt advised of plan for treatment and pt agrees.  Labs Review Labs Reviewed  CBC - Abnormal; Notable for the following:    HCT 34.1 (*)    All other components within normal limits  BASIC METABOLIC PANEL - Abnormal; Notable for the following:    GFR calc non Af Amer 89 (*)    All other components within normal limits  PRO B NATRIURETIC PEPTIDE  POC URINE PREG, ED    Imaging Review Dg Chest 2  View  06/22/2014   CLINICAL DATA:  Chest pain  EXAM: CHEST  2 VIEW  COMPARISON:  June 20, 2014  FINDINGS: The lungs are clear. Heart size and pulmonary vascularity are normal. No adenopathy. No pneumothorax. There is lower thoracic levoscoliosis. There is mild thoracic lordosis.  IMPRESSION: No edema or consolidation. 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 There is mild thoracic lordosis and lower thoracic levoscoliosis.   Electronically Signed   By: Lowella Grip M.D.   On: 06/22/2014 18:52     EKG Interpretation   Date/Time:  Saturday June 22 2014 16:53:31 EDT Ventricular Rate:  49 PR Interval:  176 QRS Duration: 76 QT Interval:  470 QTC Calculation: 424 R Axis:   68 Text Interpretation:  Sinus bradycardia with sinus arrhythmia Septal  infarct , age undetermined Abnormal ECG No significant change since last  tracing Confirmed by Christy Gentles  MD, DONALD ([id number]) on 06/22/2014 7:02:40 PM  MDM   Final diagnoses:  Peripheral edema  Chest pain, unspecified chest pain type   Anita Hill presents with chest pain, not present now and bilateral peripheral edema for 3 days.  Patient seen yesterday for chest pain with normal troponin and nonischemic ECG.  Patient seen this morning for same with normal d-dimer and again nonischemic ECG.  Record review shows no history of DVT or PE though patient reports differently.    7:40 PM Patient was nonischemic ECG.  Negative pregnancy test, normal BNP, unremarkable CBC, BMP. Chest x-ray without evidence of cardiomegaly or pulmonary edema.  At this time patient of bilateral peripheral pitting edema is unlikely to be secondary to congestive heart failure. I highly doubt DVT or PE. [place] criteria with a score of one, patient low risk. Patient PERC negative.  This time I do not believe that patient's swelling is of cardiac or pulmonary etiology. A template  of her chest pain is cardiac or pulmonary etiology either.  Will give Lasix, head nose. Patient is to followup with Dr. Mar Daring within 3 days for further evaluation of her chest pain and lower leg swelling. She is also to followup with her primary care physician within 3 days for the same. She is to return to the emergency department for returning chest pain, worsening swelling, high fevers or other concerning symptoms.  I have personally reviewed patient's vitals, nursing note and any pertinent labs or imaging.  I performed an undressed physical exam.    At this time, it has been determined that no acute conditions requiring further emergency intervention. The patient/guardian have been advised of the diagnosis and plan. I reviewed all labs and imaging including any potential incidental findings.   Vital signs are stable at discharge.   BP 100/60  Pulse 64  Temp(Src) 98.4 F (36.9 C) (Oral)  Resp 16  SpO2 99%  LMP 04/09/2014   The patient was discussed with and seen by Dr. Christy Gentles who agrees with the treatment plan.      Abigail Butts, PA-C 06/22/14 1943

## 2014-06-22 NOTE — Discharge Instructions (Signed)
Follow up with your PCP for further evaluation and management of your feet swelling. Recommend RICE therapy. Keep feet elevated, with compression wraps and ice packs, may help with the discomfort you experience.   Emergency Department Resource Guide 1) Find a Doctor and Pay Out of Pocket Although you won't have to find out who is covered by your insurance plan, it is a good idea to ask around and get recommendations. You will then need to call the office and see if the doctor you have chosen will accept you as a new patient and what types of options they offer for patients who are self-pay. Some doctors offer discounts or will set up payment plans for their patients who do not have insurance, but you will need to ask so you aren't surprised when you get to your appointment.  2) Contact Your Local Health Department Not all health departments have doctors that can see patients for sick visits, but many do, so it is worth a call to see if yours does. If you don't know where your local health department is, you can check in your phone book. The CDC also has a tool to help you locate your state's health department, and many state websites also have listings of all of their local health departments.  3) Find a [facility] If your illness is not likely to be very severe or complicated, you may want to try a walk in clinic. These are popping up all over the country in pharmacies, drugstores, and shopping centers. They're usually staffed by nurse practitioners or physician assistants that have been trained to treat common illnesses and complaints. They're usually fairly quick and inexpensive. However, if you have serious medical issues or chronic medical problems, these are probably not your best option.  No Primary Care Doctor: - Call Health Connect at  [phone] - they can help you locate a primary care doctor that  accepts your insurance, provides certain services, etc. - Physician Referral Service-  [phone]  Chronic Pain Problems: Organization         Address  Phone   Notes  [facility]  [phone] Patients need to be referred by their primary care doctor.   Medication Assistance: Organization         Address  Phone   Notes  [facility] Medication [facility] [address] [place], [place] [address] [phone] --Must be a resident of [facility] -- Must have NO insurance coverage whatsoever (no Medicaid/ Medicare, etc.) -- The pt. MUST have a primary care doctor that directs their care regularly and follows them in the community   MedAssist  [phone]   [employer]  [phone]    Agencies that provide inexpensive medical care: Organization         Address  Phone   Notes  [place]  [phone]   [facility]    [phone]   [facility] [place], [place] [address] [phone]   [place] [address], [place] [phone]   Planned Parenthood    [phone]   [facility]    [phone]   [place] and [address], [place] Phone:  [phone], Fax:  [phone] Hours of Operation:  9 am - 6 pm, M-F.  Also accepts Medicaid/Medicare and self-pay.  [facility]  [place], [address], [place] Phone: [phone], Fax: [phone]. Hours of Operation:  8:30 am - 5:30 pm, M-F.  Also accepts Medicaid and self-pay.  [facility] [address], [place] Phone: [phone]   [place], [place], [place] [phone], Ext. [phone] Mondays & Thursdays: 7-9 AM.  First 15 patients are seen on a first come, first serve basis.    [place] Providers:  Organization         Address  Phone   Notes  [facility] [address],  [place] [phone] Also accepts self-pay patients.  [facility] [address], [place]  [phone]   [place], [address], [place] [phone]   [facility] [address] [place] [phone]   Lucianne Lei [address], [place]   [phone] Only accepts [place] Access [place] patients after they have their name applied to their card.   Self-Pay (no insurance) in [facility]:  Organization         Address  Phone   Notes  Sickle Cell Patients, [facility] [place] [phone]   [facility] [place] [phone]   [facility] [place]  [place], [place], [place] [phone]   [facility]/Dr. Osei-Bonsu  [address], [place] or [address], [place] [phone] Phone number for both [place] and [place] locations is the same.  Urgent [facility] [address], [place] [phone]   [facility] [address] [place] or [address] [phone]   [facility] [address] [place] [phone], phone; [phone], fax Sees patients 1st and 3rd Saturday of every month.  Must not qualify for public or private insurance (i.e. Medicaid, Medicare, [place] Health Choice, Veterans' Benefits)  Household income should be no more than 200% of the poverty level The clinic cannot treat you if you are pregnant or think you are pregnant  Sexually transmitted diseases are not treated at the clinic.    Dental Care: Organization         Address  Phone  Notes  [facility] Department of [facility] [place] [phone] Accepts children up to age 50 who are enrolled in  [place] or [place]; pregnant women with a Medicaid card; and children who have applied for Medicaid or [place] Health Choice, but were declined, whose parents can pay a reduced fee at time of service.  [facility] Department of [facility]  [address], [place] [phone] Accepts children up to age 26 who are enrolled in [place] or [place]; pregnant women with a Medicaid card; and children who have applied for Medicaid or [place] Health Choice, but were declined, whose parents can pay a reduced fee at time of service.  [place] Adult Dental Access PROGRAM  [place] [phone] Patients are seen by appointment only. Walk-ins are not accepted. [place] will see patients 70 years of age and older. Monday - Tuesday (8am-5pm) Most Wednesdays (8:30-5pm) $30 per visit, cash only  [place] Adult  Dental Access PROGRAM  [address], [facility] [phone] Patients are seen by appointment only. Walk-ins are not accepted. [place] will see patients 42 years of age and older. One Wednesday Evening (Monthly: Volunteer Based).  $30 per visit, cash only  [place]  [phone] for adults; Children under age 54, call Graduate Pediatric Dentistry at [phone]. Children aged 65-14, please call [phone] to request a pediatric application.  Dental services are provided in all areas of dental care including fillings, crowns and bridges, complete and partial dentures, implants, gum treatment, root canals, and extractions. Preventive care is also provided. Treatment is provided to both adults and children. Patients are selected via a lottery and there is often a waiting list.   [facility] [address] [place]  [phone] [email or web address]   [facility] [address] [place], [place] [phone], Ext. [phone] Second and Fourth Thursday of each month, opens at 6:30  AM; Clinic ends at 9 AM.  Patients are seen on a first-come first-served basis, and a limited number are seen during each clinic.   [facility]  [address] Hillard Danker [place], [place] [phone]   Eligibility Requirements You must have lived in [place], [place], or [place] counties for at least the last three months.   You cannot be eligible for state or federal sponsored [employer], including [employer], [place], or [employer].   You generally cannot be eligible for healthcare insurance through your employer.    How to apply: Eligibility screenings are held every Tuesday and Wednesday afternoon from 1:00 pm until 4:00 pm. You do not need an appointment for the interview!  [facility] [address] [place], [place]   [place]  [place] Department  [place]  [phone]    Behavioral Health Resources in the Community: Intensive Outpatient Programs Organization         Address  Phone  Notes  [place]. [address], [place], [place] [phone]   [facility] Outpatient [address], [place], [place]   ADS: Alcohol & Drug Svcs [address] [place], [place]   [place] [address]  [place], [place] or [phone]   Substance Abuse Resources Organization         Address  Phone  Notes  Alcohol and Drug Services  [phone]   [place]  [phone]   [facility] [place]   Chinita Pester  [phone]   Residential & Outpatient Substance Abuse Program  [phone]   Psychological Services Organization         Address  Phone  Notes  [facility] [place]  [place]  [phone]   [place] [address], [place] [phone] or  [phone]    Mobile Crisis Teams Organization         Address  Phone  Notes  [facility], Mobile Crisis Care Unit  [phone]   [facility]ive Psychotherapeutic Services  [address] [place], [place]   [place] [address] [place] [phone]    Self-Help/Support Groups Organization         Address  Phone             Notes  Mental  Health Assoc. of [place] - variety of support groups  [place] Call for more information  Narcotics Anonymous (NA), [facility] [address], [employer] [place]  2 meetings at this location   Special educational needs teacher         Address  Phone  Notes  [facility]idential Treatment [place],    [place]  [phone]   [facility]  [address] [place] [address], [place], [place]   [place], [place] [phone] Admissions: 8am-3pm M-F  Incentives Substance [place] [address],    [place], [place] [phone]   [facility] [address] [place], [place], [place]   [facility] [address]  [place], [place]   [facility] - Intensive Outpatient [address]een [address], [place], [place]   [facility] ([place].,  [place], [place] [phone] or [phone]   [facility] Services (RTS) [address], [place], [place] Accepts Medicaid  [facility] [place] [address],  [place] 1-9195257597 Substance Abuse/Addiction Treatment   [facility] Organization         Address  Phone  Notes  [facility]  [phone]   Domenic Schwab, PhD [address] Arlis Porta [place], [place]   [phone] or [phone]   [place], [place] [phone]   [facility] [address]  [place], [place] [phone] Insurance/Medicaid/sponsorship through [facility] [address]                                    [place], [place] [phone] [place] [address], [place] [phone]    Dr. Adele Schilder  [phone]   [facility] Dept. 1) [address], [place] 2) [place] 3)  [place] [address], [place] [phone]  [phone]   [place] [phone] or [phone] (After Hours)

## 2014-06-24 NOTE — ED Provider Notes (Signed)
Medical screening examination/treatment/procedure(s) were performed by non-physician practitioner and as supervising physician I was immediately available for consultation/collaboration.   EKG Interpretation None       Kalman Drape, MD 06/24/14 1335

## 2014-07-04 ENCOUNTER — Telehealth: Payer: Self-pay | Admitting: Cardiology

## 2014-07-04 NOTE — Telephone Encounter (Signed)
Closed encounter °

## 2014-07-14 ENCOUNTER — Encounter (HOSPITAL_COMMUNITY): Payer: Self-pay | Admitting: Emergency Medicine

## 2014-07-14 ENCOUNTER — Emergency Department (HOSPITAL_COMMUNITY)
Admission: EM | Admit: 2014-07-14 | Discharge: 2014-07-14 | Disposition: A | Discharge status: Home / Self Care | Payer: Medicaid Other | Attending: Emergency Medicine | Admitting: Emergency Medicine

## 2014-07-14 DIAGNOSIS — J3489 Other specified disorders of nose and nasal sinuses: Secondary | ICD-10-CM | POA: Insufficient documentation

## 2014-07-14 DIAGNOSIS — M79609 Pain in unspecified limb: Secondary | ICD-10-CM | POA: Diagnosis present

## 2014-07-14 DIAGNOSIS — Z8659 Personal history of other mental and behavioral disorders: Secondary | ICD-10-CM | POA: Insufficient documentation

## 2014-07-14 DIAGNOSIS — Z5900 Homelessness unspecified: Secondary | ICD-10-CM | POA: Insufficient documentation

## 2014-07-14 DIAGNOSIS — Z8679 Personal history of other diseases of the circulatory system: Secondary | ICD-10-CM | POA: Insufficient documentation

## 2014-07-14 DIAGNOSIS — Z862 Personal history of diseases of the blood and blood-forming organs and certain disorders involving the immune mechanism: Secondary | ICD-10-CM | POA: Diagnosis not present

## 2014-07-14 DIAGNOSIS — M79604 Pain in right leg: Secondary | ICD-10-CM

## 2014-07-14 DIAGNOSIS — F17200 Nicotine dependence, unspecified, uncomplicated: Secondary | ICD-10-CM | POA: Diagnosis not present

## 2014-07-14 DIAGNOSIS — R0981 Nasal congestion: Secondary | ICD-10-CM

## 2014-07-14 MED ORDER — IBUPROFEN 600 MG PO TABS: 600.0000 mg | ORAL_TABLET | Freq: Four times a day (QID) | ORAL | Status: DC | PRN

## 2014-07-14 MED ORDER — SALINE SPRAY 0.65 % NA SOLN: 1.0000 | NASAL | Status: DC | PRN

## 2014-07-14 MED ORDER — IBUPROFEN 200 MG PO TABS
600.0000 mg | ORAL_TABLET | Freq: Once | ORAL | Status: DC
  Filled 2014-07-14: qty 3

## 2014-07-14 NOTE — ED Notes (Signed)
Tatyana, PA-C, at the bedside.  

## 2014-07-14 NOTE — ED Notes (Signed)
The pt aRRIVED BY [facility]  FROM HOME C/O SOB NONE NOW.  SHE IS NIW C/O RT LEG PAIN FOR 8 WEEKS AFTER RECEIVING VIT B 12 SHOTS

## 2014-07-14 NOTE — Discharge Instructions (Signed)
Use saline intranasally for congestion. Ibuprofen for pain. Follow up with primary care doctor.

## 2014-07-14 NOTE — ED Provider Notes (Signed)
Medical screening examination/treatment/procedure(s) were performed by non-physician practitioner and as supervising physician I was immediately available for consultation/collaboration.   EKG Interpretation None       Kalman Drape, MD 07/14/14 [phone]

## 2014-07-14 NOTE — ED Provider Notes (Signed)
CSN: [id number]     Arrival date & time 07/14/14  0148 History   First MD Initiated Contact with Patient 07/14/14 0636     Chief Complaint  Patient presents with  . Leg Pain     (Consider location/radiation/quality/duration/timing/severity/associated sxs/prior Treatment) HPI Anita Hill is a 43 y.o. female who presents to ED with complaint of nasal congestion and pain in her right leg. Pt states symptoms in her leg began few months ago. Patient states she has not tried any for congestion. She denies any fever, chills. She states her right leg started hurting after she received a B12 shot 8 weeks ago. She states she was seen for the same in the past and had an ultrasound done to rule out clots. She states she was told there is no clots but she does not believe the reading. She states she saw a that she had 3 clots on the monitor. I reviewed patient's chart and indeed she did have a venous Doppler done in June 2015, which was negative. He should denies any injuries to the leg. There is no fever, chills. There is no swelling. She states that she feels like her bowel legs are swollen, and is wondering where the fluid is coming from. Patient also reports pain in bilateral lower legs. No treatment tried prior to their arrival. No other complaints.  Past Medical History  Diagnosis Date  . Palpitations   . Psychosis   . BIPOLAR DISORDER   . Substance abuse   . Delusions   . Homelessness   . Left ventricular hypertrophy   . Sickle cell trait   . Homelessness    Past Surgical History  Procedure Laterality Date  . Bunionectomy     Family History  Problem Relation Age of Onset  . Heart disease Mother     CHF   History  Substance Use Topics  . Smoking status: Current Every Day Smoker -- 1.00 packs/day for 10 years    Types: Cigarettes  . Smokeless tobacco: Not on file  . Alcohol Use: No   OB History   Grav Para Term Preterm Abortions TAB SAB Ect Mult Living   1               Review of Systems  Constitutional: Negative for fever and chills.  HENT: Positive for congestion. Negative for sinus pressure, sore throat and trouble swallowing.   Respiratory: Negative for cough, chest tightness and shortness of breath.   Cardiovascular: Negative for chest pain, palpitations and leg swelling.  Gastrointestinal: Negative for nausea, vomiting, abdominal pain and diarrhea.  Genitourinary: Negative for dysuria, flank pain and pelvic pain.  Musculoskeletal: Positive for arthralgias. Negative for myalgias, neck pain and neck stiffness.  Skin: Negative for rash.  Neurological: Negative for dizziness, weakness and headaches.  All other systems reviewed and are negative.     Allergies  Strawberry  Home Medications   Prior to Admission medications   Medication Sig Start Date End Date Taking? Authorizing Provider  amitriptyline (ELAVIL) 25 MG tablet Take 25 mg by mouth at bedtime.   Yes Historical Provider, MD  ibuprofen (ADVIL,MOTRIN) 600 MG tablet Take 600 mg by mouth every 6 (six) hours as needed for moderate pain.   Yes Historical Provider, MD  LORazepam (ATIVAN) 0.5 MG tablet Take 0.5 mg by mouth every 8 (eight) hours.   Yes Historical Provider, MD  nitroGLYCERIN (NITROSTAT) 0.4 MG SL tablet Place 0.4 mg under the tongue every 5 (five) minutes as needed  for chest pain.   Yes Historical Provider, MD   BP 110/71  Pulse 56  Temp(Src) 97.7 F (36.5 C) (Oral)  Resp 15  SpO2 99%  LMP 07/07/2014 Physical Exam  Nursing note and vitals reviewed. Constitutional: She appears well-developed and well-nourished. No distress.  HENT:  Head: Normocephalic.  Mouth/Throat: Oropharynx is clear and moist.  Nasal congestion  Eyes: Conjunctivae are normal. Pupils are equal, round, and reactive to light.  Neck: Neck supple.  Cardiovascular: Normal rate, regular rhythm, normal heart sounds and intact distal pulses.   Pulmonary/Chest: Effort normal and breath sounds normal. No  respiratory distress. She has no wheezes. She has no rales.  Abdominal: Soft. Bowel sounds are normal. She exhibits no distension. There is no tenderness. There is no rebound.  Musculoskeletal: She exhibits no edema.  Normal appearing right thigh, right knee, right lower leg, bilateral feet. Dorsal pedal pulses intact and equal bilaterally. There is no skin discoloration, there is no point to the touch, no difficulty moving her right knee, hip, ankle joints. There is no signs of infection. Feet are pink and warm.  Neurological: She is alert.  Skin: Skin is warm and dry.  Psychiatric: She has a normal mood and affect. Her behavior is normal.    ED Course  Procedures (including critical care time) Labs Review Labs Reviewed - No data to display  Imaging Review No results found.   EKG Interpretation None      MDM   Final diagnoses:  Leg pain, right  Nasal congestion    Patient here with chronic right leg pain, congestion. Will treat with saline drops, most likely viral versus allergic rhinitis. I reviewed patient's chart, patient with prior visits for the same pain in her. No signs of infection based on exam. Neurovascularly intact. Venous Doppler in June was negative. Instructed to take ibuprofen for pain, followup with primary care Dr. Patient agrees.  Filed Vitals:   07/14/14 0544 07/14/14 0545 07/14/14 0630 07/14/14 0700  BP: 125/83 119/83 119/91 110/71  Pulse: 59 56 63 56  Temp:      TempSrc:      Resp: 14 16 12 15   SpO2: 100% 94% 99% 99%      Renold Genta, PA-C 07/14/14 [phone]

## 2014-07-14 NOTE — ED Notes (Signed)
To bedside with ibuprofen, unable to locate patient

## 2014-08-16 ENCOUNTER — Encounter (HOSPITAL_COMMUNITY): Payer: Self-pay | Admitting: Emergency Medicine

## 2014-08-16 ENCOUNTER — Emergency Department (HOSPITAL_COMMUNITY)
Admission: EM | Admit: 2014-08-16 | Discharge: 2014-08-16 | Disposition: A | Discharge status: Home / Self Care | Payer: Medicaid Other | Attending: Emergency Medicine | Admitting: Emergency Medicine

## 2014-08-16 DIAGNOSIS — F919 Conduct disorder, unspecified: Secondary | ICD-10-CM | POA: Diagnosis not present

## 2014-08-16 DIAGNOSIS — Z862 Personal history of diseases of the blood and blood-forming organs and certain disorders involving the immune mechanism: Secondary | ICD-10-CM | POA: Insufficient documentation

## 2014-08-16 DIAGNOSIS — F319 Bipolar disorder, unspecified: Secondary | ICD-10-CM | POA: Diagnosis not present

## 2014-08-16 DIAGNOSIS — R4689 Other symptoms and signs involving appearance and behavior: Secondary | ICD-10-CM

## 2014-08-16 DIAGNOSIS — Z79899 Other long term (current) drug therapy: Secondary | ICD-10-CM | POA: Diagnosis not present

## 2014-08-16 DIAGNOSIS — Z720 Tobacco use: Secondary | ICD-10-CM | POA: Diagnosis not present

## 2014-08-16 DIAGNOSIS — Z008 Encounter for other general examination: Secondary | ICD-10-CM | POA: Diagnosis present

## 2014-08-16 DIAGNOSIS — Z590 Homelessness: Secondary | ICD-10-CM | POA: Insufficient documentation

## 2014-08-16 DIAGNOSIS — Z8679 Personal history of other diseases of the circulatory system: Secondary | ICD-10-CM | POA: Diagnosis not present

## 2014-08-16 LAB — CBC
HEMATOCRIT: 40.4 % (ref 36.0–46.0)
HEMOGLOBIN: 14.2 g/dL (ref 12.0–15.0)
MCH: 28.9 pg (ref 26.0–34.0)
MCHC: 35.1 g/dL (ref 30.0–36.0)
MCV: 82.3 fL (ref 78.0–100.0)
Platelets: 218 10*3/uL (ref 150–400)
RBC: 4.91 MIL/uL (ref 3.87–5.11)
RDW: 13.2 % (ref 11.5–15.5)
WBC: 5.3 10*3/uL (ref 4.0–10.5)

## 2014-08-16 LAB — COMPREHENSIVE METABOLIC PANEL
ALT: 9 U/L (ref 0–35)
AST: 17 U/L (ref 0–37)
Albumin: 4.1 g/dL (ref 3.5–5.2)
Alkaline Phosphatase: 47 U/L (ref 39–117)
Anion gap: 12 (ref 5–15)
BUN: 10 mg/dL (ref 6–23)
CO2: 27 mEq/L (ref 19–32)
Calcium: 9.6 mg/dL (ref 8.4–10.5)
Chloride: 101 mEq/L (ref 96–112)
Creatinine, Ser: 0.91 mg/dL (ref 0.50–1.10)
GFR calc Af Amer: 88 mL/min — ABNORMAL LOW (ref >90)
GFR, EST NON AFRICAN AMERICAN: 76 mL/min — AB (ref >90)
GLUCOSE: 84 mg/dL (ref 70–99)
POTASSIUM: 3.9 meq/L (ref 3.7–5.3)
SODIUM: 140 meq/L (ref 137–147)
TOTAL PROTEIN: 7.1 g/dL (ref 6.0–8.3)
Total Bilirubin: 0.4 mg/dL (ref 0.3–1.2)

## 2014-08-16 LAB — RAPID URINE DRUG SCREEN, HOSP PERFORMED
AMPHETAMINES: NOT DETECTED
Barbiturates: NOT DETECTED
Benzodiazepines: NOT DETECTED
Cocaine: NOT DETECTED
OPIATES: NOT DETECTED
Tetrahydrocannabinol: NOT DETECTED

## 2014-08-16 LAB — ACETAMINOPHEN LEVEL

## 2014-08-16 LAB — ETHANOL: Alcohol, Ethyl (B): <11 mg/dL (ref 0–11)

## 2014-08-16 LAB — SALICYLATE LEVEL: Salicylate Lvl: <2 mg/dL — ABNORMAL LOW (ref 2.8–20.0)
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
## 2014-08-16 NOTE — ED Notes (Signed)
staffijng office and chg rn notofied
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
## 2014-08-16 NOTE — ED Notes (Signed)
The pt has been polaced in  [place] and her blood has been  drawn

## 2014-08-16 NOTE — ED Notes (Signed)
The pt is accompanied by Sports administrator.  She has involuntary commitment papers by her room mate

## 2014-08-22 ENCOUNTER — Ambulatory Visit: Payer: Medicaid Other | Admitting: Cardiology

## 2014-08-29 ENCOUNTER — Encounter (HOSPITAL_COMMUNITY): Payer: Self-pay | Admitting: Emergency Medicine

## 2014-08-29 ENCOUNTER — Emergency Department (HOSPITAL_COMMUNITY)
Admission: EM | Admit: 2014-08-29 | Discharge: 2014-08-30 | Disposition: A | Discharge status: Home / Self Care | Payer: Medicaid Other | Attending: Emergency Medicine | Admitting: Emergency Medicine

## 2014-08-29 DIAGNOSIS — F319 Bipolar disorder, unspecified: Secondary | ICD-10-CM | POA: Insufficient documentation

## 2014-08-29 DIAGNOSIS — Z79899 Other long term (current) drug therapy: Secondary | ICD-10-CM | POA: Insufficient documentation

## 2014-08-29 DIAGNOSIS — J069 Acute upper respiratory infection, unspecified: Secondary | ICD-10-CM

## 2014-08-29 DIAGNOSIS — Z8679 Personal history of other diseases of the circulatory system: Secondary | ICD-10-CM | POA: Diagnosis not present

## 2014-08-29 DIAGNOSIS — Z862 Personal history of diseases of the blood and blood-forming organs and certain disorders involving the immune mechanism: Secondary | ICD-10-CM | POA: Diagnosis not present

## 2014-08-29 DIAGNOSIS — R05 Cough: Secondary | ICD-10-CM | POA: Diagnosis present

## 2014-08-29 DIAGNOSIS — Z720 Tobacco use: Secondary | ICD-10-CM | POA: Diagnosis not present

## 2014-08-29 DIAGNOSIS — Z590 Homelessness: Secondary | ICD-10-CM | POA: Diagnosis not present

## 2014-08-29 MED ORDER — PSEUDOEPHEDRINE HCL ER 120 MG PO TB12: 120.0000 mg | ORAL_TABLET | Freq: Two times a day (BID) | ORAL | Status: DC

## 2014-08-29 MED ORDER — PSEUDOEPHEDRINE HCL ER 120 MG PO TB12
120.0000 mg | ORAL_TABLET | Freq: Two times a day (BID) | ORAL | Status: DC
  Administered 2014-08-30: 120 mg via ORAL
  Filled 2014-08-29: qty 1

## 2014-08-29 NOTE — ED Provider Notes (Signed)
CSN: [id number]     Arrival date & time 08/29/14  2238 History   First MD Initiated Contact with Patient 08/29/14 2259    This chart was scribed for NP, Junius Creamer, working with Evelina Bucy, MD by Terressa Koyanagi, ED Scribe. This patient was seen in room WTR7/WTR7 and the patient's care was started at 11:02 PM.  Chief Complaint  Patient presents with  . Cough  . Nasal Congestion   The history is provided by the patient. No language interpreter was used.   PCP: PROVIDER NOT IN SYSTEM HPI Comments: Anita Hill is a 43 y.o. female, brought in by ambulance, with medical Hx noted below, who presents to the Emergency Department complaining of intermittent productive cough with yellow sputum with associated congestion onset 3 days ago. Pt reports that she has been staying hydrated at home and taking alkaseltzer without relief. Pt denies abd pain, chills or fever.    Past Medical History  Diagnosis Date  . Palpitations   . Psychosis   . BIPOLAR DISORDER   . Substance abuse   . Delusions   . Homelessness   . Left ventricular hypertrophy   . Sickle cell trait   . Homelessness    Past Surgical History  Procedure Laterality Date  . Bunionectomy     Family History  Problem Relation Age of Onset  . Heart disease Mother     CHF   History  Substance Use Topics  . Smoking status: Current Every Day Smoker -- 1.00 packs/day for 10 years    Types: Cigarettes  . Smokeless tobacco: Not on file  . Alcohol Use: No   OB History    Gravida Para Term Preterm AB TAB SAB Ectopic Multiple Living   1              Review of Systems  Constitutional: Negative for fever and chills.  HENT: Positive for congestion.   Respiratory: Positive for cough.   Gastrointestinal: Negative for abdominal pain.  Psychiatric/Behavioral: Negative for confusion.  All other systems reviewed and are negative.  Allergies  Strawberry and Risperidone and related  Home Medications   Prior to Admission  medications   Medication Sig Start Date End Date Taking? Authorizing Provider  amitriptyline (ELAVIL) 25 MG tablet Take 25 mg by mouth at bedtime as needed for sleep.    Yes Historical Provider, MD  amoxicillin (AMOXIL) 500 MG tablet Take 2,000 mg by mouth daily as needed. 1 hour prior to dental appt   Yes Historical Provider, MD  ibuprofen (ADVIL,MOTRIN) 600 MG tablet Take 600 mg by mouth every 6 (six) hours as needed for moderate pain.   Yes Historical Provider, MD  LORazepam (ATIVAN) 0.5 MG tablet Take 0.5 mg by mouth every 8 (eight) hours as needed for anxiety.    Yes Historical Provider, MD  lurasidone (LATUDA) 40 MG TABS tablet Take 40 mg by mouth daily with breakfast.   Yes Historical Provider, MD  nitroGLYCERIN (NITROSTAT) 0.4 MG SL tablet Place 0.4 mg under the tongue every 5 (five) minutes as needed for chest pain.   Yes Historical Provider, MD  OXcarbazepine (TRILEPTAL) 150 MG tablet Take 150 mg by mouth 2 (two) times daily.   Yes Historical Provider, MD  Phenyleph-CPM-DM-Aspirin (ALKA-SELTZER PLUS COLD & COUGH) 7.06-02-09-325 MG TBEF Take 2 tablets by mouth every 6 (six) hours as needed. Congestion   Yes Historical Provider, MD  pseudoephedrine (SUDAFED) 120 MG 12 hr tablet Take 1 tablet (120 mg total) by mouth  2 (two) times daily. 08/29/14   Garald Balding, NP   Triage Vitals: BP 140/97  Pulse 75  Temp(Src) 98.1 F (36.7 C) (Oral)  Resp 20  SpO2 100%  LMP 08/25/2014 Physical Exam  Nursing note and vitals reviewed. Constitutional: She is oriented to person, place, and time. She appears well-developed and well-nourished. No distress.  Normally healthy female with Hx of bipolar presents with three days of nasal congestion, postnasal drip causing nocturnal drip. Denies fever or Hx of asthma. Pt has been taking OTC alkaseltzer without relief.   HENT:  Head: Normocephalic and atraumatic.  Eyes: Conjunctivae and EOM are normal.  Neck: Neck supple.  Cardiovascular: Normal rate.    Pulmonary/Chest: Effort normal. No respiratory distress.  Musculoskeletal: Normal range of motion.  Neurological: She is alert and oriented to person, place, and time.  Skin: Skin is warm and dry.  Psychiatric: She has a normal mood and affect. Her behavior is normal.    ED Course  Procedures (including critical care time) DIAGNOSTIC STUDIES: Oxygen Saturation is 100% on RA, nl by my interpretation.    COORDINATION OF CARE: 11:04 PM-Discussed treatment plan which includes meds with pt at bedside and pt agreed to plan.   Labs Review Labs Reviewed - No data to display  Imaging Review No results found.   EKG Interpretation None      MDM   Final diagnoses:  URI (upper respiratory infection)         I personally performed the services described in this documentation, which was scribed in my presence. The recorded information has been reviewed and is accurate.  Garald Balding, NP 09/02/14 2003

## 2014-08-29 NOTE — ED Notes (Signed)
Pt presents by EMS with c/o cough and congestion for 5 days. Pt feels sinus pressure in her head, ambulatory to triage.

## 2014-08-29 NOTE — Discharge Instructions (Signed)
Upper Respiratory Infection, Adult An upper respiratory infection (URI) is also sometimes known as the common cold. The upper respiratory tract includes the nose, sinuses, throat, trachea, and bronchi. Bronchi are the airways leading to the lungs. Most people improve within 1 week, but symptoms can last up to 2 weeks. A residual cough may last even longer.  CAUSES Many different viruses can infect the tissues lining the upper respiratory tract. The tissues become irritated and inflamed and often become very moist. Mucus production is also common. A cold is contagious. You can easily spread the virus to others by oral contact. This includes kissing, sharing a glass, coughing, or sneezing. Touching your mouth or nose and then touching a surface, which is then touched by another person, can also spread the virus. SYMPTOMS  Symptoms typically develop 1 to 3 days after you come in contact with a cold virus. Symptoms vary from person to person. They may include:  Runny nose.  Sneezing.  Nasal congestion.  Sinus irritation.  Sore throat.  Loss of voice (laryngitis).  Cough.  Fatigue.  Muscle aches.  Loss of appetite.  Headache.  Low-grade fever. DIAGNOSIS  You might diagnose your own cold based on familiar symptoms, since most people get a cold 2 to 3 times a year. Your caregiver can confirm this based on your exam. Most importantly, your caregiver can check that your symptoms are not due to another disease such as strep throat, sinusitis, pneumonia, asthma, or epiglottitis. Blood tests, throat tests, and X-rays are not necessary to diagnose a common cold, but they may sometimes be helpful in excluding other more serious diseases. Your caregiver will decide if any further tests are required. RISKS AND COMPLICATIONS  You may be at risk for a more severe case of the common cold if you smoke cigarettes, have chronic heart disease (such as heart failure) or lung disease (such as asthma), or if  you have a weakened immune system. The very young and very old are also at risk for more serious infections. Bacterial sinusitis, middle ear infections, and bacterial pneumonia can complicate the common cold. The common cold can worsen asthma and chronic obstructive pulmonary disease (COPD). Sometimes, these complications can require emergency medical care and may be life-threatening. PREVENTION  The best way to protect against getting a cold is to practice good hygiene. Avoid oral or hand contact with people with cold symptoms. Wash your hands often if contact occurs. There is no clear evidence that vitamin C, vitamin E, echinacea, or exercise reduces the chance of developing a cold. However, it is always recommended to get plenty of rest and practice good nutrition. TREATMENT  Treatment is directed at relieving symptoms. There is no cure. Antibiotics are not effective, because the infection is caused by a virus, not by bacteria. Treatment may include:  Increased fluid intake. Sports drinks offer valuable electrolytes, sugars, and fluids.  Breathing heated mist or steam (vaporizer or shower).  Eating chicken soup or other clear broths, and maintaining good nutrition.  Getting plenty of rest.  Using gargles or lozenges for comfort.  Controlling fevers with ibuprofen or acetaminophen as directed by your caregiver.  Increasing usage of your inhaler if you have asthma. Zinc gel and zinc lozenges, taken in the first 24 hours of the common cold, can shorten the duration and lessen the severity of symptoms. Pain medicines may help with fever, muscle aches, and throat pain. A variety of non-prescription medicines are available to treat congestion and runny nose. Your caregiver   can make recommendations and may suggest nasal or lung inhalers for other symptoms.  HOME CARE INSTRUCTIONS   Only take over-the-counter or prescription medicines for pain, discomfort, or fever as directed by your  caregiver.  Use a warm mist humidifier or inhale steam from a shower to increase air moisture. This may keep secretions moist and make it easier to breathe.  Drink enough water and fluids to keep your urine clear or pale yellow.  Rest as needed.  Return to work when your temperature has returned to normal or as your caregiver advises. You may need to stay home longer to avoid infecting others. You can also use a face mask and careful hand washing to prevent spread of the virus. SEEK MEDICAL CARE IF:   After the first few days, you feel you are getting worse rather than better.  You need your caregiver's advice about medicines to control symptoms.  You develop chills, worsening shortness of breath, or brown or red sputum. These may be signs of pneumonia.  You develop yellow or brown nasal discharge or pain in the face, especially when you bend forward. These may be signs of sinusitis.  You develop a fever, swollen neck glands, pain with swallowing, or white areas in the back of your throat. These may be signs of strep throat. SEEK IMMEDIATE MEDICAL CARE IF:   You have a fever.  You develop severe or persistent headache, ear pain, sinus pain, or chest pain.  You develop wheezing, a prolonged cough, cough up blood, or have a change in your usual mucus (if you have chronic lung disease).  You develop sore muscles or a stiff neck. Document Released: 04/13/2001 Document Revised: 01/10/2012 Document Reviewed: 01/23/2014 Exit[employer] Patient Information 2015 [employer]. This information is not intended to replace advice given to you by your health care provider. Make sure you discuss any questions you have with your health care provider.  

## 2014-09-02 ENCOUNTER — Encounter (HOSPITAL_COMMUNITY): Payer: Self-pay | Admitting: Emergency Medicine

## 2014-09-30 ENCOUNTER — Ambulatory Visit: Payer: Medicaid Other | Admitting: Cardiology

## 2014-10-04 ENCOUNTER — Emergency Department (HOSPITAL_COMMUNITY)
Admission: EM | Admit: 2014-10-04 | Discharge: 2014-10-04 | Disposition: A | Discharge status: Home / Self Care | Payer: Medicaid Other | Attending: Emergency Medicine | Admitting: Emergency Medicine

## 2014-10-04 ENCOUNTER — Encounter (HOSPITAL_COMMUNITY): Payer: Self-pay | Admitting: Emergency Medicine

## 2014-10-04 DIAGNOSIS — F319 Bipolar disorder, unspecified: Secondary | ICD-10-CM | POA: Diagnosis not present

## 2014-10-04 DIAGNOSIS — Z760 Encounter for issue of repeat prescription: Secondary | ICD-10-CM | POA: Insufficient documentation

## 2014-10-04 DIAGNOSIS — Z590 Homelessness: Secondary | ICD-10-CM | POA: Insufficient documentation

## 2014-10-04 DIAGNOSIS — Z862 Personal history of diseases of the blood and blood-forming organs and certain disorders involving the immune mechanism: Secondary | ICD-10-CM | POA: Diagnosis not present

## 2014-10-04 DIAGNOSIS — F419 Anxiety disorder, unspecified: Secondary | ICD-10-CM | POA: Diagnosis not present

## 2014-10-04 DIAGNOSIS — Z720 Tobacco use: Secondary | ICD-10-CM | POA: Insufficient documentation
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
## 2014-10-04 NOTE — ED Notes (Addendum)
Pt reports her Dr dropped her and she hasn't found another dr.  Abbott Pao needs med refill for Lorazepam. Pt alert and oriented.  NAD noted.
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
## 2014-10-04 NOTE — Discharge Instructions (Signed)
Chronic Pain Discharge Instructions  °Emergency care providers appreciate that many patients coming to us are in severe pain and we wish to address their pain in the safest, most responsible manner.  It is important to recognize however, that the proper treatment of chronic pain differs from that of the pain of injuries and acute illnesses.  Our goal is to provide quality, safe, personalized care and we thank you for giving us the opportunity to serve you. °The use of narcotics and related agents for chronic pain syndromes may lead to additional physical and psychological problems.  Nearly as many people die from prescription narcotics each year as die from car crashes.  Additionally, this risk is increased if such prescriptions are obtained from a variety of sources.  Therefore, only your primary care physician or a pain management specialist is able to safely treat such syndromes with narcotic medications long-term.   ° °Documentation revealing such prescriptions have been sought from multiple sources may prohibit us from providing a refill or different narcotic medication.  Your name may be checked first through the [place] Controlled Substances Reporting System.  This database is a record of controlled substance medication prescriptions that the patient has received.  This has been established by [facility] in an effort to eliminate the dangerous, and often life threatening, practice of obtaining multiple prescriptions from different medical providers.  ° °If you have a chronic pain syndrome (i.e. chronic headaches, recurrent back or neck pain, dental pain, abdominal or pelvis pain without a specific diagnosis, or neuropathic pain such as fibromyalgia) or recurrent visits for the same condition without an acute diagnosis, you may be treated with non-narcotics and other non-addictive medicines.  Allergic reactions or negative side effects that may be reported by a patient to such medications will not  typically lead to the use of a narcotic analgesic or other controlled substance as an alternative. °  °Patients managing chronic pain with a personal physician should have provisions in place for breakthrough pain.  If you are in crisis, you should call your physician.  If your physician directs you to the emergency department, please have the doctor call and speak to our attending physician concerning your care. °  °When patients come to the Emergency Department (ED) with acute medical conditions in which the Emergency Department physician feels appropriate to prescribe narcotic or sedating pain medication, the physician will prescribe these in very limited quantities.  The amount of these medications will last only until you can see your primary care physician in his/her office.  Any patient who returns to the ED seeking refills should expect only non-narcotic pain medications.  ° °In the event of an acute medical condition exists and the emergency physician feels it is necessary that the patient be given a narcotic or sedating medication -  a responsible adult driver should be present in the room prior to the medication being given by the nurse. °  °Prescriptions for narcotic or sedating medications that have been lost, stolen or expired will not be refilled in the Emergency Department.   ° °Patients who have chronic pain may receive non-narcotic prescriptions until seen by their primary care physician.  It is every patient’s personal responsibility to maintain active prescriptions with his or her primary care physician or specialist. °

## 2015-01-01 ENCOUNTER — Emergency Department (HOSPITAL_COMMUNITY)
Admission: EM | Admit: 2015-01-01 | Discharge: 2015-01-01 | Discharge status: Against Medical Advice | Payer: Medicaid Other | Attending: Emergency Medicine | Admitting: Emergency Medicine

## 2015-01-01 ENCOUNTER — Encounter (HOSPITAL_COMMUNITY): Payer: Self-pay

## 2015-01-01 DIAGNOSIS — Z8659 Personal history of other mental and behavioral disorders: Secondary | ICD-10-CM | POA: Insufficient documentation

## 2015-01-01 DIAGNOSIS — Z720 Tobacco use: Secondary | ICD-10-CM | POA: Insufficient documentation

## 2015-01-01 DIAGNOSIS — Z590 Homelessness: Secondary | ICD-10-CM | POA: Insufficient documentation

## 2015-01-01 DIAGNOSIS — Z862 Personal history of diseases of the blood and blood-forming organs and certain disorders involving the immune mechanism: Secondary | ICD-10-CM | POA: Diagnosis not present

## 2015-01-01 DIAGNOSIS — Z0441 Encounter for examination and observation following alleged adult rape: Secondary | ICD-10-CM | POA: Diagnosis not present

## 2015-01-01 DIAGNOSIS — IMO0002 Reserved for concepts with insufficient information to code with codable children: Secondary | ICD-10-CM

## 2015-01-01 DIAGNOSIS — Z79899 Other long term (current) drug therapy: Secondary | ICD-10-CM | POA: Diagnosis not present

## 2015-01-01 DIAGNOSIS — Z8679 Personal history of other diseases of the circulatory system: Secondary | ICD-10-CM | POA: Insufficient documentation

## 2015-01-01 LAB — URINE MICROSCOPIC-ADD ON

## 2015-01-01 LAB — URINALYSIS, ROUTINE W REFLEX MICROSCOPIC
BILIRUBIN URINE: NEGATIVE
GLUCOSE, UA: NEGATIVE mg/dL
Ketones, ur: NEGATIVE mg/dL
Leukocytes, UA: NEGATIVE
Nitrite: NEGATIVE
PH: 5.5 (ref 5.0–8.0)
Protein, ur: NEGATIVE mg/dL
SPECIFIC GRAVITY, URINE: 1.014 (ref 1.005–1.030)
Urobilinogen, UA: 0.2 mg/dL (ref 0.0–1.0)

## 2015-01-01 LAB — PREGNANCY, URINE: PREG TEST UR: NEGATIVE

## 2015-01-01 NOTE — ED Notes (Signed)
Pt got urine sample before notifying this RN, pt did not save toilet tissue.
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
## 2015-01-01 NOTE — ED Notes (Signed)
When patient asked about allergy to risperidol-- pt replied, "Misty is, but Evaline is not".

## 2015-01-01 NOTE — ED Notes (Signed)
PT refused blood work
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
## 2015-01-02 ENCOUNTER — Emergency Department (HOSPITAL_COMMUNITY)
Admission: EM | Admit: 2015-01-02 | Discharge: 2015-01-03 | Disposition: A | Discharge status: Home / Self Care | Payer: No Typology Code available for payment source | Attending: Emergency Medicine | Admitting: Emergency Medicine

## 2015-01-02 ENCOUNTER — Encounter (HOSPITAL_COMMUNITY): Payer: Self-pay | Admitting: Emergency Medicine

## 2015-01-02 DIAGNOSIS — Z862 Personal history of diseases of the blood and blood-forming organs and certain disorders involving the immune mechanism: Secondary | ICD-10-CM | POA: Insufficient documentation

## 2015-01-02 DIAGNOSIS — T7621XA Adult sexual abuse, suspected, initial encounter: Secondary | ICD-10-CM | POA: Insufficient documentation

## 2015-01-02 DIAGNOSIS — F419 Anxiety disorder, unspecified: Secondary | ICD-10-CM | POA: Insufficient documentation

## 2015-01-02 DIAGNOSIS — Z590 Homelessness: Secondary | ICD-10-CM | POA: Diagnosis not present

## 2015-01-02 DIAGNOSIS — Z720 Tobacco use: Secondary | ICD-10-CM | POA: Diagnosis not present

## 2015-01-02 DIAGNOSIS — Z3202 Encounter for pregnancy test, result negative: Secondary | ICD-10-CM | POA: Diagnosis not present

## 2015-01-02 DIAGNOSIS — Z8679 Personal history of other diseases of the circulatory system: Secondary | ICD-10-CM | POA: Diagnosis not present

## 2015-01-02 DIAGNOSIS — IMO0002 Reserved for concepts with insufficient information to code with codable children: Secondary | ICD-10-CM

## 2015-01-02 LAB — POC URINE PREG, ED: Preg Test, Ur: NEGATIVE

## 2015-01-02 MED ORDER — LORAZEPAM 1 MG PO TABS: 1.0000 mg | ORAL_TABLET | Freq: Once | ORAL | Status: DC

## 2015-01-02 NOTE — ED Notes (Signed)
SANE RN/[place] contacted
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
## 2015-01-03 MED ORDER — CEFIXIME 400 MG PO TABS
ORAL_TABLET | ORAL | Status: AC
  Filled 2015-01-03: qty 1

## 2015-01-03 MED ORDER — PROMETHAZINE HCL 25 MG PO TABS
ORAL_TABLET | ORAL | Status: AC
  Filled 2015-01-03: qty 3

## 2015-01-03 MED ORDER — ULIPRISTAL ACETATE 30 MG PO TABS
ORAL_TABLET | ORAL | Status: AC
  Filled 2015-01-03: qty 1

## 2015-01-03 MED ORDER — METRONIDAZOLE 500 MG PO TABS
ORAL_TABLET | ORAL | Status: AC
  Filled 2015-01-03: qty 4

## 2015-01-03 MED ORDER — PROMETHAZINE HCL 25 MG PO TABS: 25.0000 mg | ORAL_TABLET | Freq: Four times a day (QID) | ORAL | Status: DC | PRN

## 2015-01-03 MED ORDER — CEFIXIME 400 MG PO TABS: 400.0000 mg | ORAL_TABLET | Freq: Once | ORAL | Status: DC

## 2015-01-03 MED ORDER — AZITHROMYCIN 1 G PO PACK
PACK | ORAL | Status: AC
  Filled 2015-01-03: qty 1

## 2015-01-03 MED ORDER — METRONIDAZOLE 500 MG PO TABS
2000.0000 mg | ORAL_TABLET | Freq: Once | ORAL | Status: DC
  Filled 2015-01-03: qty 4

## 2015-01-03 MED ORDER — CIPROFLOXACIN HCL 500 MG PO TABS
500.0000 mg | ORAL_TABLET | Freq: Once | ORAL | Status: DC
  Filled 2015-01-03: qty 1

## 2015-01-03 MED ORDER — AZITHROMYCIN 1 G PO PACK
1.0000 g | PACK | Freq: Once | ORAL | Status: DC
  Filled 2015-01-03: qty 1

## 2015-01-03 MED ORDER — ULIPRISTAL ACETATE 30 MG PO TABS: 30.0000 mg | ORAL_TABLET | Freq: Once | ORAL | Status: DC

## 2015-01-03 MED ORDER — IBUPROFEN 400 MG PO TABS
600.0000 mg | ORAL_TABLET | Freq: Once | ORAL | Status: DC
  Filled 2015-01-03 (×2): qty 1
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
## 2015-01-03 NOTE — ED Notes (Signed)
Pt asking to leave. RN explained to pt that we needed to wait for medications to come from pharmacy. Pt requesting to be discharged. RN notified PA.

## 2015-01-03 NOTE — SANE Note (Signed)
Medications returned to pharmacy by ER nurse

## 2015-01-04 NOTE — SANE Note (Signed)
Spoke with pt on the phone and explained the process and length of time to perform kit collection. Pt had been seen the day before by SANE and declined to stay. Pt is manic but verbalized understanding.
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
## 2015-01-27 ENCOUNTER — Encounter (HOSPITAL_COMMUNITY): Payer: Self-pay | Admitting: *Deleted

## 2015-01-27 ENCOUNTER — Emergency Department (HOSPITAL_COMMUNITY)
Admission: EM | Admit: 2015-01-27 | Discharge: 2015-01-28 | Disposition: A | Discharge status: Home / Self Care | Payer: Medicaid Other | Attending: Emergency Medicine | Admitting: Emergency Medicine

## 2015-01-27 DIAGNOSIS — Z113 Encounter for screening for infections with a predominantly sexual mode of transmission: Secondary | ICD-10-CM

## 2015-01-27 DIAGNOSIS — Z3202 Encounter for pregnancy test, result negative: Secondary | ICD-10-CM | POA: Diagnosis not present

## 2015-01-27 DIAGNOSIS — Z8679 Personal history of other diseases of the circulatory system: Secondary | ICD-10-CM | POA: Diagnosis not present

## 2015-01-27 DIAGNOSIS — N898 Other specified noninflammatory disorders of vagina: Secondary | ICD-10-CM | POA: Diagnosis not present

## 2015-01-27 DIAGNOSIS — Z590 Homelessness: Secondary | ICD-10-CM | POA: Diagnosis not present

## 2015-01-27 DIAGNOSIS — Z202 Contact with and (suspected) exposure to infections with a predominantly sexual mode of transmission: Secondary | ICD-10-CM | POA: Diagnosis not present

## 2015-01-27 DIAGNOSIS — Z720 Tobacco use: Secondary | ICD-10-CM | POA: Insufficient documentation

## 2015-01-27 DIAGNOSIS — Z8659 Personal history of other mental and behavioral disorders: Secondary | ICD-10-CM | POA: Insufficient documentation

## 2015-01-27 DIAGNOSIS — Z862 Personal history of diseases of the blood and blood-forming organs and certain disorders involving the immune mechanism: Secondary | ICD-10-CM | POA: Diagnosis not present

## 2015-01-27 DIAGNOSIS — L293 Anogenital pruritus, unspecified: Secondary | ICD-10-CM | POA: Diagnosis present
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
## 2015-01-28 ENCOUNTER — Encounter (HOSPITAL_COMMUNITY): Payer: Self-pay

## 2015-01-28 ENCOUNTER — Emergency Department (HOSPITAL_COMMUNITY)
Admission: EM | Admit: 2015-01-28 | Discharge: 2015-01-28 | Discharge status: Against Medical Advice | Payer: Medicaid Other | Source: Home / Self Care | Attending: Emergency Medicine | Admitting: Emergency Medicine

## 2015-01-28 DIAGNOSIS — Z590 Homelessness: Secondary | ICD-10-CM | POA: Insufficient documentation

## 2015-01-28 DIAGNOSIS — Z8679 Personal history of other diseases of the circulatory system: Secondary | ICD-10-CM | POA: Insufficient documentation

## 2015-01-28 DIAGNOSIS — R1033 Periumbilical pain: Secondary | ICD-10-CM

## 2015-01-28 DIAGNOSIS — Z8659 Personal history of other mental and behavioral disorders: Secondary | ICD-10-CM

## 2015-01-28 DIAGNOSIS — Z720 Tobacco use: Secondary | ICD-10-CM | POA: Insufficient documentation

## 2015-01-28 DIAGNOSIS — Z862 Personal history of diseases of the blood and blood-forming organs and certain disorders involving the immune mechanism: Secondary | ICD-10-CM | POA: Insufficient documentation

## 2015-01-28 LAB — URINALYSIS, ROUTINE W REFLEX MICROSCOPIC
BILIRUBIN URINE: NEGATIVE
Glucose, UA: NEGATIVE mg/dL
HGB URINE DIPSTICK: NEGATIVE
Ketones, ur: NEGATIVE mg/dL
Leukocytes, UA: NEGATIVE
NITRITE: NEGATIVE
Protein, ur: NEGATIVE mg/dL
Specific Gravity, Urine: 1.017 (ref 1.005–1.030)
UROBILINOGEN UA: 1 mg/dL (ref 0.0–1.0)
pH: 7.5 (ref 5.0–8.0)

## 2015-01-28 LAB — POC URINE PREG, ED: Preg Test, Ur: NEGATIVE

## 2015-01-28 LAB — HIV ANTIBODY (ROUTINE TESTING W REFLEX): HIV Screen 4th Generation wRfx: NONREACTIVE

## 2015-01-28 LAB — RPR: RPR: NONREACTIVE

## 2015-01-28 MED ORDER — CEFTRIAXONE SODIUM 250 MG IJ SOLR
250.0000 mg | Freq: Once | INTRAMUSCULAR | Status: AC
  Administered 2015-01-28: 250 mg via INTRAMUSCULAR
  Filled 2015-01-28: qty 250

## 2015-01-28 MED ORDER — FLUCONAZOLE 200 MG PO TABS: 200.0000 mg | ORAL_TABLET | Freq: Every day | ORAL | Status: DC

## 2015-01-28 MED ORDER — AZITHROMYCIN 250 MG PO TABS
1000.0000 mg | ORAL_TABLET | Freq: Once | ORAL | Status: AC
  Administered 2015-01-28: 1000 mg via ORAL
  Filled 2015-01-28: qty 4

## 2015-01-28 NOTE — Discharge Instructions (Signed)
Syphilis Detection Test This test is used to screen for syphilis infection. It is sometimes done if you have symptoms of a syphilis infection, if you have another sexually transmitted disease (STD), or are pregnant. The test is looking for evidence of Treponema pallidum, the bacterium (bug) that causes syphilis. Syphilis is a common sexually transmitted disease. It is easily treated but can cause severe health problems if left untreated. SAMPLE REQUIRED  A scraping from the affected area, a blood sample from a vein in your arm, or a spinal tap may be done depending on the test being used and what stage the disease is in. Different samples are needed.  For new infections, your doctor may take a scraping from a chancre (sore) on the affected area (the cervix, penis, anus, or throat).  Your doctor may draw blood from a vein in your arm for an additional test.  If you have late or latent stages of the disease, your caregiver will order a spinal tap to check for infection of the nervous system. NORMAL FINDINGS Negative or non reactive.  Ranges for normal findings may vary among different laboratories and hospitals. You should always check with your doctor after having lab work or other tests done to discuss the meaning of your test results and whether your values are considered within normal limits. MEANING OF TEST  Your caregiver will go over the test results with you and discuss the importance and meaning of your results, as well as treatment options and the need for additional tests if necessary. OBTAINING THE TEST RESULTS It is your responsibility to obtain your test results. Ask the lab or department performing the test when and how you will get your results. Document Released: 11/20/2004 Document Revised: 01/10/2012 Document Reviewed: 01/25/2014 [facility] Patient Information 2015 [place], [place]. This information is not intended to replace advice given to you by your health care provider. Make  sure you discuss any questions you have with your health care provider.  Pruritus  Pruritus is an itch. There are many different problems that can cause an itch. Dry skin is one of the most common causes of itching. Most cases of itching do not require medical attention.  HOME CARE INSTRUCTIONS  Make sure your skin is moistened on a regular basis. A moisturizer that contains petroleum jelly is best for keeping moisture in your skin. If you develop a rash, you may try the following for relief:   Use corticosteroid cream.  Apply cool compresses to the affected areas.  Bathe with Epsom salts or baking soda in the bathwater.  Soak in colloidal oatmeal baths. These are available at your pharmacy.  Apply baking soda paste to the rash. Stir water into baking soda until it reaches a paste-like consistency.  Use an anti-itch lotion.  Take over-the-counter diphenhydramine medicine by mouth as the instructions direct.  Avoid scratching. Scratching may cause the rash to become infected. If itching is very bad, your caregiver may suggest prescription lotions or creams to lessen your symptoms.  Avoid hot showers, which can make itching worse. A cold shower may help with itching as long as you use a moisturizer after the shower. SEEK MEDICAL CARE IF: The itching does not go away after several days. Document Released: 06/30/2011 Document Revised: 03/04/2014 Document Reviewed: 06/30/2011 [facility] Patient Information 2015 [place], [place]. This information is not intended to replace advice given to you by your health care provider. Make sure you discuss any questions you have with your health care provider.  Sexual  Assault or Rape Sexual assault is any sexual activity that a person is forced, threatened, or coerced into participating in. It may or may not involve physical contact. You are being sexually abused if you are forced to have sexual contact of any kind. Sexual assault is called rape if  penetration has occurred (vaginal, oral, or anal). Many times, sexual assaults are committed by a friend, relative, or associate. Sexual assault and rape are never the victim's fault.  Sexual assault can result in various health problems for the person who was assaulted. Some of these problems include:  Physical injuries in the genital area or other areas of the body.  Risk of unwanted pregnancy.  Risk of sexually transmitted infections (STIs).  Psychological problems such as anxiety, depression, or posttraumatic stress disorder. WHAT STEPS SHOULD BE TAKEN AFTER A SEXUAL ASSAULT? If you have been sexually assaulted, you should take the following steps as soon as possible:  Go to a safe area as quickly as possible and call your local emergency services (911 in [place] Get away from the area where you have been attacked.   Do not wash, shower, comb your hair, or clean any part of your body.   Do not change your clothes.   Do not remove or touch anything in the area where you were assaulted.   Go to an emergency room for a complete physical exam. Get the necessary tests to protect yourself from STIs or pregnancy. You may be treated for an STI even if no signs of one are present. Emergency contraceptive medicines are also available to help prevent pregnancy, if this is desired. You may need to be examined by a specially trained health care provider.  Have the health care provider collect evidence during the exam, even if you are not sure if you will file a report with the police.  Find out how to file the correct papers with the authorities. This is important for all assaults, even if they were committed by a family member or friend.  Find out where you can get additional help and support, such as a local rape crisis center.  Follow up with your health care provider as directed.  HOW CAN YOU REDUCE THE CHANCES OF SEXUAL ASSAULT? Take the following steps to help reduce your chances of  being sexually assaulted:  Consider carrying mace or pepper spray for protection against an attacker.   Consider taking a self-defense course.  Do not try to fight off an attacker if he or she has a gun or knife.   Be aware of your surroundings, what is happening around you, and who might be there.   Be assertive, trust your instincts, and walk with confidence and direction.  Be careful not to drink too much alcohol or use other intoxicants. These can reduce your ability to fight off an assault.  Always lock your doors and windows. Be sure to have high-quality locks for your home.   Do not let people enter your house if you do not know them.   Get a home security system that has a siren if you are able.   Protect the keys to your house and car. Do not lend them out. Do not put your name and address on them. If you lose them, get your locks changed.   Always lock your car and have your key ready to open the door before approaching the car.   Park in a well-lit and busy area.  Plan your driving routes  so that you travel on well-lit and frequently used streets.  Keep your car serviced. Always have at least half a tank of gas in it.   Do not go into isolated areas alone. This includes open garages, empty buildings or offices, or [employer].   Do not walk or jog alone, especially when it is dark.   Never hitchhike.   If your car breaks down, call the police for help on your cell phone and stay inside the car with your doors locked and windows up.   If you are being followed, go to a busy area and call for help.   If you are stopped by a police officer, especially one in an unmarked police car, keep your door locked. Do not put your window down all the way. Ask the officer to show you identification first.   Be aware of "date rape drugs" that can be placed in a drink when you are not looking. These drugs can make you unable to fight off an assault. FOR  MORE INFORMATION  Office on [employer], [place] Department of Health and Human Services: [employer]pl  National Sexual Assault Hotline: 1-800-656-HOPE (262)658-3062)  [place]: 1-800-799-SAFE (915) 135-5532) or www.thehotline.org Document Released: 10/15/2000 Document Revised: 06/20/2013 Document Reviewed: 03/21/2013 [facility] Patient Information 2015 [place], [place]. This information is not intended to replace advice given to you by your health care provider. Make sure you discuss any questions you have with your health care provider.
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
## 2015-01-28 NOTE — ED Notes (Signed)
Pt stable, ambulatory, denies any pain.

## 2015-01-28 NOTE — ED Notes (Signed)
Pt was seen here earlier this evening, pt was discharged and was sitting in the waiting room and pt stated that she was coming back to be seen again for 2 reason, 1. The pt has no ride and "it's not safe to be out this time of night" and 2. The pt went to the bathroom in the waiting room and had a bowel movement and states that her stomach started hurting prompting her to come back and be seen. Pt in no acute distress.

## 2015-01-28 NOTE — ED Notes (Signed)
Pt left room angrily cursing and left AMA. Pt in no acute distress. Pt refused to sign AMA form.

## 2015-01-31 ENCOUNTER — Encounter (HOSPITAL_COMMUNITY): Payer: Self-pay | Admitting: *Deleted

## 2015-01-31 ENCOUNTER — Emergency Department (HOSPITAL_COMMUNITY)
Admission: EM | Admit: 2015-01-31 | Discharge: 2015-02-01 | Disposition: A | Discharge status: Home / Self Care | Payer: Medicaid Other | Attending: Emergency Medicine | Admitting: Emergency Medicine

## 2015-01-31 DIAGNOSIS — R51 Headache: Secondary | ICD-10-CM | POA: Insufficient documentation

## 2015-01-31 DIAGNOSIS — Z8659 Personal history of other mental and behavioral disorders: Secondary | ICD-10-CM | POA: Diagnosis not present

## 2015-01-31 DIAGNOSIS — Z862 Personal history of diseases of the blood and blood-forming organs and certain disorders involving the immune mechanism: Secondary | ICD-10-CM | POA: Diagnosis not present

## 2015-01-31 DIAGNOSIS — Z720 Tobacco use: Secondary | ICD-10-CM | POA: Insufficient documentation

## 2015-01-31 DIAGNOSIS — R519 Headache, unspecified: Secondary | ICD-10-CM

## 2015-01-31 DIAGNOSIS — Z590 Homelessness: Secondary | ICD-10-CM | POA: Diagnosis not present

## 2015-01-31 NOTE — ED Notes (Signed)
Pt c/o headache since last night. Pt has not taken any medication for headache.
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
## 2015-02-01 MED ORDER — SODIUM CHLORIDE 0.9 % IV BOLUS (SEPSIS)
1000.0000 mL | Freq: Once | INTRAVENOUS | Status: AC
  Administered 2015-02-01: 1000 mL via INTRAVENOUS

## 2015-02-01 MED ORDER — DIPHENHYDRAMINE HCL 50 MG/ML IJ SOLN
25.0000 mg | Freq: Once | INTRAMUSCULAR | Status: AC
  Administered 2015-02-01: 25 mg via INTRAVENOUS
  Filled 2015-02-01: qty 1

## 2015-02-01 MED ORDER — KETOROLAC TROMETHAMINE 30 MG/ML IJ SOLN
30.0000 mg | Freq: Once | INTRAMUSCULAR | Status: AC
  Administered 2015-02-01: 30 mg via INTRAVENOUS
  Filled 2015-02-01: qty 1

## 2015-02-01 MED ORDER — IBUPROFEN 800 MG PO TABS: 800.0000 mg | ORAL_TABLET | Freq: Three times a day (TID) | ORAL | Status: DC | PRN

## 2015-02-01 MED ORDER — METOCLOPRAMIDE HCL 5 MG/ML IJ SOLN
10.0000 mg | Freq: Once | INTRAMUSCULAR | Status: AC
  Administered 2015-02-01: 10 mg via INTRAVENOUS
  Filled 2015-02-01: qty 2

## 2015-02-01 NOTE — Discharge Instructions (Signed)

## 2015-02-05 ENCOUNTER — Encounter: Payer: Medicaid Other | Admitting: Family Medicine

## 2015-02-06 DIAGNOSIS — Z792 Long term (current) use of antibiotics: Secondary | ICD-10-CM | POA: Diagnosis not present

## 2015-02-06 DIAGNOSIS — Z590 Homelessness: Secondary | ICD-10-CM | POA: Diagnosis not present

## 2015-02-06 DIAGNOSIS — M79651 Pain in right thigh: Secondary | ICD-10-CM | POA: Insufficient documentation

## 2015-02-06 DIAGNOSIS — Z8659 Personal history of other mental and behavioral disorders: Secondary | ICD-10-CM | POA: Diagnosis not present

## 2015-02-06 DIAGNOSIS — R079 Chest pain, unspecified: Secondary | ICD-10-CM | POA: Insufficient documentation

## 2015-02-06 DIAGNOSIS — Z862 Personal history of diseases of the blood and blood-forming organs and certain disorders involving the immune mechanism: Secondary | ICD-10-CM | POA: Insufficient documentation

## 2015-02-06 DIAGNOSIS — Z8679 Personal history of other diseases of the circulatory system: Secondary | ICD-10-CM | POA: Insufficient documentation

## 2015-02-06 DIAGNOSIS — Z720 Tobacco use: Secondary | ICD-10-CM | POA: Insufficient documentation

## 2015-02-07 ENCOUNTER — Emergency Department (HOSPITAL_COMMUNITY): Payer: Medicaid Other

## 2015-02-07 ENCOUNTER — Encounter (HOSPITAL_COMMUNITY): Payer: Self-pay | Admitting: Emergency Medicine

## 2015-02-07 ENCOUNTER — Emergency Department (HOSPITAL_COMMUNITY)
Admission: EM | Admit: 2015-02-07 | Discharge: 2015-02-07 | Disposition: A | Discharge status: Home / Self Care | Payer: Medicaid Other | Attending: Emergency Medicine | Admitting: Emergency Medicine

## 2015-02-07 ENCOUNTER — Emergency Department (HOSPITAL_COMMUNITY)
Admission: EM | Admit: 2015-02-07 | Discharge: 2015-02-07 | Disposition: A | Discharge status: Other Acute Inpatient Hospital | Payer: Medicaid Other

## 2015-02-07 DIAGNOSIS — R079 Chest pain, unspecified: Secondary | ICD-10-CM

## 2015-02-07 LAB — CBC WITH DIFFERENTIAL/PLATELET
BASOS ABS: 0 10*3/uL (ref 0.0–0.1)
Basophils Relative: 0 % (ref 0–1)
EOS PCT: 4 % (ref 0–5)
Eosinophils Absolute: 0.2 10*3/uL (ref 0.0–0.7)
HCT: 36.5 % (ref 36.0–46.0)
Hemoglobin: 12.5 g/dL (ref 12.0–15.0)
Lymphocytes Relative: 58 % — ABNORMAL HIGH (ref 12–46)
Lymphs Abs: 2.8 10*3/uL (ref 0.7–4.0)
MCH: 28.7 pg (ref 26.0–34.0)
MCHC: 34.2 g/dL (ref 30.0–36.0)
MCV: 83.9 fL (ref 78.0–100.0)
Monocytes Absolute: 0.4 10*3/uL (ref 0.1–1.0)
Monocytes Relative: 8 % (ref 3–12)
NEUTROS ABS: 1.5 10*3/uL — AB (ref 1.7–7.7)
Neutrophils Relative %: 30 % — ABNORMAL LOW (ref 43–77)
Platelets: 197 10*3/uL (ref 150–400)
RBC: 4.35 MIL/uL (ref 3.87–5.11)
RDW: 12.7 % (ref 11.5–15.5)
WBC: 4.8 10*3/uL (ref 4.0–10.5)

## 2015-02-07 LAB — COMPREHENSIVE METABOLIC PANEL
ALT: 11 U/L (ref 0–35)
ANION GAP: 9 (ref 5–15)
AST: 19 U/L (ref 0–37)
Albumin: 4 g/dL (ref 3.5–5.2)
Alkaline Phosphatase: 42 U/L (ref 39–117)
BUN: 8 mg/dL (ref 6–23)
CALCIUM: 9.2 mg/dL (ref 8.4–10.5)
CO2: 26 mmol/L (ref 19–32)
CREATININE: 0.9 mg/dL (ref 0.50–1.10)
Chloride: 106 mmol/L (ref 96–112)
GFR, EST AFRICAN AMERICAN: 89 mL/min — AB (ref >90)
GFR, EST NON AFRICAN AMERICAN: 77 mL/min — AB (ref >90)
GLUCOSE: 90 mg/dL (ref 70–99)
Potassium: 3.4 mmol/L — ABNORMAL LOW (ref 3.5–5.1)
SODIUM: 141 mmol/L (ref 135–145)
TOTAL PROTEIN: 6.2 g/dL (ref 6.0–8.3)
Total Bilirubin: 0.7 mg/dL (ref 0.3–1.2)

## 2015-02-07 LAB — I-STAT TROPONIN, ED: Troponin i, poc: 0 ng/mL (ref 0.00–0.08)

## 2015-02-07 LAB — LIPASE, BLOOD: Lipase: 30 U/L (ref 11–59)

## 2015-02-07 MED ORDER — IBUPROFEN 800 MG PO TABS
800.0000 mg | ORAL_TABLET | Freq: Once | ORAL | Status: AC
  Administered 2015-02-07: 800 mg via ORAL
  Filled 2015-02-07: qty 1

## 2015-02-07 NOTE — ED Notes (Signed)
Pt reports chest pain that started after eating [employer]  Hour ago. Now her R leg is aching as well. Pt had biopsy on R foot today. C.o chills as well.

## 2015-02-07 NOTE — Discharge Instructions (Signed)
Chest Pain (Nonspecific) Ms. Anita Hill, see a primary care physician within 3 days for close follow-up. Take Motrin as needed for pain. If symptoms worsen come back to the emergency department immediately. Thank you. It is often hard to give a diagnosis for the cause of chest pain. There is always a chance that your pain could be related to something serious, such as a heart attack or a blood clot in the lungs. You need to follow up with your doctor. HOME CARE  If antibiotic medicine was given, take it as directed by your doctor. Finish the medicine even if you start to feel better.  For the next few days, avoid activities that bring on chest pain. Continue physical activities as told by your doctor.  Do not use any tobacco products. This includes cigarettes, chewing tobacco, and e-cigarettes.  Avoid drinking alcohol.  Only take medicine as told by your doctor.  Follow your doctor's suggestions for more testing if your chest pain does not go away.  Keep all doctor visits you made. GET HELP IF:  Your chest pain does not go away, even after treatment.  You have a rash with blisters on your chest.  You have a fever. GET HELP RIGHT AWAY IF:   You have more pain or pain that spreads to your arm, neck, jaw, back, or belly (abdomen).  You have shortness of breath.  You cough more than usual or cough up blood.  You have very bad back or belly pain.  You feel sick to your stomach (nauseous) or throw up (vomit).  You have very bad weakness.  You pass out (faint).  You have chills. This is an emergency. Do not wait to see if the problems will go away. Call your local emergency services (911 in [place] Do not drive yourself to the hospital. MAKE SURE YOU:   Understand these instructions.  Will watch your condition.  Will get help right away if you are not doing well or get worse. Document Released: 04/05/2008 Document Revised: 10/23/2013 Document Reviewed: 04/05/2008 [facility]  Patient Information 2015 [place], [place]. This information is not intended to replace advice given to you by your health care provider. Make sure you discuss any questions you have with your health care provider.

## 2015-02-07 NOTE — ED Provider Notes (Signed)
CSN: [id number]     Arrival date & time 02/06/15  2354 History   None    This chart was scribed for Everlene Balls, MD by Forrestine Him, ED Scribe. This patient was seen in room A05C/A05C and the patient's care was started 1:36 AM.   Chief Complaint  Patient presents with  . Leg Pain  . Chest Pain   The history is provided by the patient. No language interpreter was used.    HPI Comments: Anita Hill is a 44 y.o. female with a PMHx of bipolar disorder, substance abuse, homelessness, sickle cell trait, and delusions  who presents to the Emergency Department complaining of intermittent, ongoing, non-radiating chest pain onset 4 PM yesterday afternoon. No aggravating or alleviating factors at this time. Pt also reports feeling "clammy". No OTC medications or home remedies attempted prior to arrival. No recent SOB, fever, or chills. Pt also reports pain to R inner thigh onset 4 PM yesterday afternoon. No trauma or injury to the area. Anita Hill denies any recent heavy lifting or physical activity. She denies any cough, congestion, or rhinorrhea. Pt with known allergy to Risperidone.  Past Medical History  Diagnosis Date  . Palpitations   . Psychosis   . BIPOLAR DISORDER   . Substance abuse   . Delusions   . Homelessness   . Left ventricular hypertrophy   . Sickle cell trait   . Homelessness    Past Surgical History  Procedure Laterality Date  . Bunionectomy     Family History  Problem Relation Age of Onset  . Heart disease Mother     CHF   History  Substance Use Topics  . Smoking status: Current Every Day Smoker -- 1.00 packs/day for 10 years    Types: Cigarettes  . Smokeless tobacco: Not on file  . Alcohol Use: No   OB History    Gravida Para Term Preterm AB TAB SAB Ectopic Multiple Living   1              Review of Systems  HENT: Negative for congestion.   Respiratory: Negative for cough and shortness of breath.   Cardiovascular: Positive for chest pain.   Musculoskeletal: Positive for arthralgias.  All other systems reviewed and are negative.     Allergies  Strawberry and Risperidone and related  Home Medications   Prior to Admission medications   Medication Sig Start Date End Date Taking? Authorizing Provider  amitriptyline (ELAVIL) 25 MG tablet Take 25 mg by mouth at bedtime as needed for sleep.     Historical Provider, MD  amoxicillin (AMOXIL) 500 MG tablet Take 2,000 mg by mouth daily as needed. 1 hour prior to dental appt    Historical Provider, MD  diphenhydramine-acetaminophen (TYLENOL PM) 25-500 MG TABS Take 1 tablet by mouth at bedtime as needed. For pain or sleep    Historical Provider, MD  ibuprofen (ADVIL,MOTRIN) 800 MG tablet Take 1 tablet (800 mg total) by mouth every 8 (eight) hours as needed for mild pain. 02/01/15   Kristen N Ward, DO  nitroGLYCERIN (NITROSTAT) 0.4 MG SL tablet Place 0.4 mg under the tongue every 5 (five) minutes as needed for chest pain.    Historical Provider, MD   Triage Vitals: BP 117/75 mmHg  Pulse 80  Temp(Src) 98.1 F (36.7 C) (Oral)  Resp 18  Ht 5\' 5"  (1.651 m)  Wt 140 lb (63.504 kg)  BMI 23.30 kg/m2  SpO2 97%  LMP 01/15/2015   Physical Exam  Constitutional: She is oriented to person, place, and time. She appears well-developed and well-nourished. No distress.  HENT:  Head: Normocephalic and atraumatic.  Nose: Nose normal.  Mouth/Throat: Oropharynx is clear and moist. No oropharyngeal exudate.  Eyes: Conjunctivae and EOM are normal. Pupils are equal, round, and reactive to light. No scleral icterus.  Neck: Normal range of motion. Neck supple. No JVD present. No tracheal deviation present. No thyromegaly present.  Cardiovascular: Normal rate, regular rhythm and normal heart sounds.  Exam reveals no gallop and no friction rub.   No murmur heard. Pulmonary/Chest: Effort normal and breath sounds normal. No respiratory distress. She has no wheezes. She exhibits no tenderness.  Abdominal:  Soft. Bowel sounds are normal. She exhibits no distension and no mass. There is no tenderness. There is no rebound and no guarding.  Musculoskeletal: Normal range of motion. She exhibits no edema or tenderness.  Lymphadenopathy:    She has no cervical adenopathy.  Neurological: She is alert and oriented to person, place, and time. No cranial nerve deficit. She exhibits normal muscle tone.  Skin: Skin is warm and dry. No rash noted. No erythema. No pallor.  Psychiatric: Judgment normal.  Flat affect   Nursing note and vitals reviewed.   ED Course  Procedures (including critical care time)  DIAGNOSTIC STUDIES: Oxygen Saturation is 97% on RA, adequate by my interpretation.    COORDINATION OF CARE: 1:36 AM-Discussed treatment plan with pt at bedside and pt agreed to plan.      Labs Review Labs Reviewed  CBC WITH DIFFERENTIAL/PLATELET - Abnormal; Notable for the following:    Neutrophils Relative % 30 (*)    Neutro Abs 1.5 (*)    Lymphocytes Relative 58 (*)    All other components within normal limits  COMPREHENSIVE METABOLIC PANEL - Abnormal; Notable for the following:    Potassium 3.4 (*)    GFR calc non Af Amer 77 (*)    GFR calc Af Amer 89 (*)    All other components within normal limits  LIPASE, BLOOD  I-STAT TROPOININ, ED    Imaging Review Dg Chest 2 View  02/07/2015   CLINICAL DATA:  Chest pain for 2 years. History of sickle cell trait  EXAM: CHEST  2 VIEW  COMPARISON:  06/22/2014  FINDINGS: Normal heart size and mediastinal contours when accounting for AP narrowing of the chest. No acute infiltrate or edema. No effusion or pneumothorax. Right glenohumeral osteoarthritis. No acute osseous findings.  IMPRESSION: No active cardiopulmonary disease.   Electronically Signed   By: Monte Fantasia M.D.   On: 02/07/2015 02:56     EKG Interpretation None      MDM   Final diagnoses:  None   Patient since emergency department for chest pain. She also has right thigh pain.  Her history is not consistent with ACS or pulmonary embolism. Patient is PERC negative and her heart score is less than 3. Troponin was not indicated in this case however it was ordered by triage nurse. It is also negative. She denies any strenuous activity during the interval.  Will obtain chest x-ray, patient was given Motrin for pain relief. There is no acute abnormalities to her thigh. During examination patient denies any significant pain when I'm pushing. There is no history of trauma. Patient has no emergent condition at this time. She is safe for discharge and primary care follow-up. Her vital signs remain within her normal limits.  I personally performed the services described in this documentation, which  was scribed in my presence. The recorded information has been reviewed and is accurate.   Everlene Balls, MD 02/09/15 1105

## 2015-02-18 ENCOUNTER — Emergency Department (HOSPITAL_COMMUNITY)
Admission: EM | Admit: 2015-02-18 | Discharge: 2015-02-18 | Disposition: A | Discharge status: Home / Self Care | Payer: Medicaid Other | Attending: Emergency Medicine | Admitting: Emergency Medicine

## 2015-02-18 ENCOUNTER — Encounter (HOSPITAL_COMMUNITY): Payer: Self-pay | Admitting: Emergency Medicine

## 2015-02-18 DIAGNOSIS — Z720 Tobacco use: Secondary | ICD-10-CM | POA: Diagnosis not present

## 2015-02-18 DIAGNOSIS — R002 Palpitations: Secondary | ICD-10-CM | POA: Insufficient documentation

## 2015-02-18 DIAGNOSIS — Z590 Homelessness: Secondary | ICD-10-CM | POA: Insufficient documentation

## 2015-02-18 DIAGNOSIS — Z8659 Personal history of other mental and behavioral disorders: Secondary | ICD-10-CM | POA: Diagnosis not present

## 2015-02-18 DIAGNOSIS — Z8679 Personal history of other diseases of the circulatory system: Secondary | ICD-10-CM | POA: Diagnosis not present

## 2015-02-18 DIAGNOSIS — E876 Hypokalemia: Secondary | ICD-10-CM | POA: Diagnosis not present

## 2015-02-18 DIAGNOSIS — Z862 Personal history of diseases of the blood and blood-forming organs and certain disorders involving the immune mechanism: Secondary | ICD-10-CM | POA: Diagnosis not present

## 2015-02-18 LAB — HEMOGLOBIN AND HEMATOCRIT, BLOOD
HCT: 34.7 % — ABNORMAL LOW (ref 36.0–46.0)
Hemoglobin: 12 g/dL (ref 12.0–15.0)

## 2015-02-18 LAB — I-STAT CHEM 8, ED
BUN: 7 mg/dL (ref 6–23)
Calcium, Ion: 1.19 mmol/L (ref 1.12–1.23)
Chloride: 101 mmol/L (ref 96–112)
Creatinine, Ser: 0.8 mg/dL (ref 0.50–1.10)
Glucose, Bld: 93 mg/dL (ref 70–99)
HEMATOCRIT: 38 % (ref 36.0–46.0)
Hemoglobin: 12.9 g/dL (ref 12.0–15.0)
POTASSIUM: 3.2 mmol/L — AB (ref 3.5–5.1)
SODIUM: 141 mmol/L (ref 135–145)
TCO2: 24 mmol/L (ref 0–100)

## 2015-02-18 MED ORDER — POTASSIUM CHLORIDE CRYS ER 20 MEQ PO TBCR: 20.0000 meq | EXTENDED_RELEASE_TABLET | Freq: Two times a day (BID) | ORAL | Status: DC

## 2015-02-18 MED ORDER — POTASSIUM CHLORIDE CRYS ER 20 MEQ PO TBCR
40.0000 meq | EXTENDED_RELEASE_TABLET | Freq: Once | ORAL | Status: AC
  Administered 2015-02-18: 40 meq via ORAL
  Filled 2015-02-18: qty 2

## 2015-02-18 NOTE — ED Provider Notes (Signed)
CSN: [id number]     Arrival date & time 02/18/15  0239 History   First MD Initiated Contact with Patient 02/18/15 [phone]     Chief Complaint  Patient presents with  . Palpitations     (Consider location/radiation/quality/duration/timing/severity/associated sxs/prior Treatment) HPI    PCP: PROVIDER NOT IN SYSTEM Blood pressure 120/78, pulse 63, temperature 98.2 F (36.8 C), temperature source Oral, resp. rate 17, last menstrual period 02/11/2015, SpO2 100 %.  Anita Hill is a 44 y.o.female with a significant PMH of palpitations, psychosis, bipolar disorder, substance abuse, delusions, homelessness, left ventricular hypertrophy, sickle cell trait presents to the ER with complaints of palpitations. The patient has a hx of LVH and palpitations, has seen a cardiologist- last time in 2013. She wore a halter and they told her that she has LVH and that would be the cause of her LVH.  They lasted approximately 1 hour. She did not have any dizziness, SOB, chest pain, fatigue or syncope or other associated symptoms with this. She reports her diet has not been very good lately and that she needs to do a better job of eating healthier.    Past Medical History  Diagnosis Date  . Palpitations   . Psychosis   . BIPOLAR DISORDER   . Substance abuse   . Delusions   . Homelessness   . Left ventricular hypertrophy   . Sickle cell trait   . Homelessness    Past Surgical History  Procedure Laterality Date  . Bunionectomy     Family History  Problem Relation Age of Onset  . Heart disease Mother     CHF   History  Substance Use Topics  . Smoking status: Current Every Day Smoker -- 1.00 packs/day for 10 years    Types: Cigarettes  . Smokeless tobacco: Not on file  . Alcohol Use: No   OB History    Gravida Para Term Preterm AB TAB SAB Ectopic Multiple Living   1              Review of Systems  10 Systems reviewed and are negative for acute change except as noted in the HPI.    Allergies  Strawberry and Risperidone and related  Home Medications   Prior to Admission medications   Medication Sig Start Date End Date Taking? Authorizing Provider  ibuprofen (ADVIL,MOTRIN) 800 MG tablet Take 1 tablet (800 mg total) by mouth every 8 (eight) hours as needed for mild pain. 02/01/15  Yes Kristen N Ward, DO  nitroGLYCERIN (NITROSTAT) 0.4 MG SL tablet Place 0.4 mg under the tongue every 5 (five) minutes as needed for chest pain.   Yes Historical Provider, MD  potassium chloride SA (K-DUR,KLOR-CON) 20 MEQ tablet Take 1 tablet (20 mEq total) by mouth 2 (two) times daily. 02/18/15   Irisa Grimsley Carlota Raspberry, PA-C   BP 119/80 mmHg  Pulse 61  Temp(Src) 98.2 F (36.8 C) (Oral)  Resp 14  SpO2 100%  LMP 02/11/2015 Physical Exam  Constitutional: She appears well-developed and well-nourished. No distress.  HENT:  Head: Normocephalic and atraumatic.  Eyes: Pupils are equal, round, and reactive to light.  Neck: Normal range of motion. Neck supple.  Cardiovascular: Normal rate and regular rhythm.   Pulmonary/Chest: Effort normal and breath sounds normal. She exhibits no tenderness and no bony tenderness.  Abdominal: Soft.  Neurological: She is alert.  Cranial nerves II-VIII and X-XII evaluated and show no deficits. Pt alert and oriented x 3 Upper and lower extremity strength is  symmetrical and physiologic Normal muscular tone No facial droop Coordination intact, no limb ataxia  Skin: Skin is warm and dry.  Nursing note and vitals reviewed.     ED Course  Procedures (including critical care time) Labs Review Labs Reviewed  HEMOGLOBIN AND HEMATOCRIT, BLOOD - Abnormal; Notable for the following:    HCT 34.7 (*)    All other components within normal limits  I-STAT CHEM 8, ED - Abnormal; Notable for the following:    Potassium 3.2 (*)    All other components within normal limits    Imaging Review No results found.   EKG Interpretation None      MDM   Final diagnoses:   Hypokalemia  Palpitations    Patient has a normal heart rhythm in the ED today. She had  1 hour episode of palpitations yesterday without chest pain, syncope, SOB or other associated symptoms. She has seen Dr. Stanford Breed in 2013 who had her wear a halter monitor and saw she has LVH per the patient. Her potassium is low today- potassium replaced in the ED.  I will recommend she follow-up with Dr. Stanford Breed- referral done today in the ED. Strict return precautions given: SOB, CP, syncope, dizziness, weakness. And any other red flag symptoms.  44 y.o.[name] evaluation in the Emergency Department is complete. It has been determined that no acute conditions requiring further emergency intervention are present at this time. The patient/guardian have been advised of the diagnosis and plan. We have discussed signs and symptoms that warrant return to the ED, such as changes or worsening in symptoms.  Vital signs are stable at discharge. Filed Vitals:   02/18/15 0730  BP: 109/72  Pulse: 60  Temp:   Resp: 18    Patient/guardian has voiced understanding and agreed to follow-up with the PCP or specialist.    Delos Haring, PA-C 02/18/15 [place], MD 02/18/15 [phone]

## 2015-02-18 NOTE — ED Notes (Signed)
Pt. reports intermittent palpitations onset yesterday , denies chest pain or SOB .

## 2015-02-18 NOTE — Discharge Instructions (Signed)
Palpitations A palpitation is the feeling that your heartbeat is irregular. It may feel like your heart is fluttering or skipping a beat. It may also feel like your heart is beating faster than normal. This is usually not a serious problem. In some cases, you may need more medical tests. HOME CARE  Avoid:  Caffeine in coffee, tea, soft drinks, diet pills, and energy drinks.  Chocolate.  Alcohol.  Stop smoking if you smoke.  Reduce your stress and anxiety. Try:  A method that measures bodily functions so you can learn to control them (biofeedback).  Yoga.  Meditation.  Physical activity such as swimming, jogging, or walking.  Get plenty of rest and sleep. GET HELP IF:  Your fast or irregular heartbeat continues after 24 hours.  Your palpitations occur more often. GET HELP RIGHT AWAY IF:   You have chest pain.  You feel short of breath.  You have a very bad headache.  You feel dizzy or pass out (faint). MAKE SURE YOU:   Understand these instructions.  Will watch your condition.  Will get help right away if you are not doing well or get worse. Document Released: 07/27/2008 Document Revised: 03/04/2014 Document Reviewed: 12/17/2011 [facility] Patient Information 2015 [place], [place]. This information is not intended to replace advice given to you by your health care provider. Make sure you discuss any questions you have with your health care provider.  Hypokalemia Hypokalemia means that the amount of potassium in the blood is lower than normal.Potassium is a chemical, called an electrolyte, that helps regulate the amount of fluid in the body. It also stimulates muscle contraction and helps nerves function properly.Most of the body's potassium is inside of cells, and only a very small amount is in the blood. Because the amount in the blood is so small, minor changes can be life-threatening. CAUSES  Antibiotics.  Diarrhea or vomiting.  Using laxatives too much,  which can cause diarrhea.  Chronic kidney disease.  Water pills (diuretics).  Eating disorders (bulimia).  Low magnesium level.  Sweating a lot. SIGNS AND SYMPTOMS  Weakness.  Constipation.  Fatigue.  Muscle cramps.  Mental confusion.  Skipped heartbeats or irregular heartbeat (palpitations).  Tingling or numbness. DIAGNOSIS  Your health care provider can diagnose hypokalemia with blood tests. In addition to checking your potassium level, your health care provider may also check other lab tests. TREATMENT Hypokalemia can be treated with potassium supplements taken by mouth or adjustments in your current medicines. If your potassium level is very low, you may need to get potassium through a vein (IV) and be monitored in the hospital. A diet high in potassium is also helpful. Foods high in potassium are:  Nuts, such as peanuts and pistachios.  Seeds, such as sunflower seeds and pumpkin seeds.  Peas, lentils, and lima beans.  Whole grain and bran cereals and breads.  Fresh fruit and vegetables, such as apricots, avocado, bananas, cantaloupe, kiwi, oranges, tomatoes, asparagus, and potatoes.  Orange and tomato juices.  Red meats.  Fruit yogurt. HOME CARE INSTRUCTIONS  Take all medicines as prescribed by your health care provider.  Maintain a healthy diet by including nutritious food, such as fruits, vegetables, nuts, whole grains, and lean meats.  If you are taking a laxative, be sure to follow the directions on the label. SEEK MEDICAL CARE IF:  Your weakness gets worse.  You feel your heart pounding or racing.  You are vomiting or having diarrhea.  You are diabetic and having trouble keeping  your blood glucose in the normal range. SEEK IMMEDIATE MEDICAL CARE IF:  You have chest pain, shortness of breath, or dizziness.  You are vomiting or having diarrhea for more than 2 days.  You faint. MAKE SURE YOU:   Understand these instructions.  Will watch  your condition.  Will get help right away if you are not doing well or get worse. Document Released: 10/18/2005 Document Revised: 08/08/2013 Document Reviewed: 04/20/2013 [facility] Patient Information 2015 [place], [place]. This information is not intended to replace advice given to you by your health care provider. Make sure you discuss any questions you have with your health care provider.

## 2015-02-18 NOTE — ED Notes (Signed)
nad noted, abc intact, updated on poc.

## 2015-02-21 ENCOUNTER — Encounter (HOSPITAL_COMMUNITY): Payer: Self-pay | Admitting: Emergency Medicine

## 2015-02-21 DIAGNOSIS — R269 Unspecified abnormalities of gait and mobility: Secondary | ICD-10-CM | POA: Diagnosis not present

## 2015-02-21 DIAGNOSIS — H539 Unspecified visual disturbance: Secondary | ICD-10-CM | POA: Insufficient documentation

## 2015-02-21 DIAGNOSIS — Z862 Personal history of diseases of the blood and blood-forming organs and certain disorders involving the immune mechanism: Secondary | ICD-10-CM | POA: Diagnosis not present

## 2015-02-21 DIAGNOSIS — R200 Anesthesia of skin: Secondary | ICD-10-CM | POA: Insufficient documentation

## 2015-02-21 DIAGNOSIS — Z720 Tobacco use: Secondary | ICD-10-CM | POA: Insufficient documentation

## 2015-02-21 DIAGNOSIS — Z79899 Other long term (current) drug therapy: Secondary | ICD-10-CM | POA: Diagnosis not present

## 2015-02-21 DIAGNOSIS — R51 Headache: Secondary | ICD-10-CM | POA: Insufficient documentation

## 2015-02-21 DIAGNOSIS — Z8659 Personal history of other mental and behavioral disorders: Secondary | ICD-10-CM | POA: Diagnosis not present

## 2015-02-21 DIAGNOSIS — R110 Nausea: Secondary | ICD-10-CM | POA: Insufficient documentation

## 2015-02-21 DIAGNOSIS — Z87828 Personal history of other (healed) physical injury and trauma: Secondary | ICD-10-CM | POA: Insufficient documentation

## 2015-02-21 NOTE — ED Notes (Signed)
Pt. reports headache , chills and fever onset today , denies SOB / no dysuria .

## 2015-02-22 ENCOUNTER — Emergency Department (HOSPITAL_COMMUNITY)
Admission: EM | Admit: 2015-02-22 | Discharge: 2015-02-22 | Disposition: A | Discharge status: Home / Self Care | Payer: Medicaid Other | Attending: Emergency Medicine | Admitting: Emergency Medicine

## 2015-02-22 DIAGNOSIS — R51 Headache: Secondary | ICD-10-CM

## 2015-02-22 DIAGNOSIS — R519 Headache, unspecified: Secondary | ICD-10-CM

## 2015-02-22 MED ORDER — KETOROLAC TROMETHAMINE 30 MG/ML IJ SOLN
30.0000 mg | Freq: Once | INTRAMUSCULAR | Status: AC
  Administered 2015-02-22: 30 mg via INTRAVENOUS
  Filled 2015-02-22: qty 1

## 2015-02-22 MED ORDER — SODIUM CHLORIDE 0.9 % IV BOLUS (SEPSIS)
1000.0000 mL | Freq: Once | INTRAVENOUS | Status: AC
  Administered 2015-02-22: 1000 mL via INTRAVENOUS

## 2015-02-22 MED ORDER — DIPHENHYDRAMINE HCL 50 MG/ML IJ SOLN
25.0000 mg | Freq: Once | INTRAMUSCULAR | Status: AC
  Administered 2015-02-22: 25 mg via INTRAVENOUS
  Filled 2015-02-22: qty 1

## 2015-02-22 MED ORDER — METOCLOPRAMIDE HCL 5 MG/ML IJ SOLN
10.0000 mg | Freq: Once | INTRAMUSCULAR | Status: AC
  Administered 2015-02-22: 10 mg via INTRAVENOUS
  Filled 2015-02-22: qty 2

## 2015-02-22 NOTE — ED Notes (Signed)
Pt. Left with all belongings 
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
## 2015-02-22 NOTE — Discharge Instructions (Signed)
Recheck as needed °

## 2015-02-28 ENCOUNTER — Emergency Department (HOSPITAL_COMMUNITY)
Admission: EM | Admit: 2015-02-28 | Discharge: 2015-03-01 | Discharge status: Against Medical Advice | Payer: Medicaid Other | Attending: Emergency Medicine | Admitting: Emergency Medicine

## 2015-02-28 ENCOUNTER — Encounter (HOSPITAL_COMMUNITY): Payer: Self-pay | Admitting: Emergency Medicine

## 2015-02-28 DIAGNOSIS — Y9389 Activity, other specified: Secondary | ICD-10-CM | POA: Diagnosis not present

## 2015-02-28 DIAGNOSIS — W231XXA Caught, crushed, jammed, or pinched between stationary objects, initial encounter: Secondary | ICD-10-CM | POA: Insufficient documentation

## 2015-02-28 DIAGNOSIS — Y998 Other external cause status: Secondary | ICD-10-CM | POA: Diagnosis not present

## 2015-02-28 DIAGNOSIS — S6991XA Unspecified injury of right wrist, hand and finger(s), initial encounter: Secondary | ICD-10-CM | POA: Insufficient documentation

## 2015-02-28 DIAGNOSIS — Y92091 Bathroom in other non-institutional residence as the place of occurrence of the external cause: Secondary | ICD-10-CM | POA: Diagnosis not present

## 2015-02-28 DIAGNOSIS — Z720 Tobacco use: Secondary | ICD-10-CM | POA: Diagnosis not present
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
## 2015-02-28 NOTE — ED Notes (Signed)
Pt said that she didn't want to stay any longer. Informed pt that she had been moved to a room and that the nurse had come looking for her. She said she would come back another night

## 2015-03-23 ENCOUNTER — Encounter (HOSPITAL_COMMUNITY): Payer: Self-pay | Admitting: *Deleted

## 2015-03-23 ENCOUNTER — Emergency Department (HOSPITAL_COMMUNITY)
Admission: EM | Admit: 2015-03-23 | Discharge: 2015-03-24 | Disposition: A | Discharge status: Home / Self Care | Payer: Medicaid Other | Attending: Emergency Medicine | Admitting: Emergency Medicine

## 2015-03-23 DIAGNOSIS — E876 Hypokalemia: Secondary | ICD-10-CM

## 2015-03-23 DIAGNOSIS — Z720 Tobacco use: Secondary | ICD-10-CM | POA: Diagnosis not present

## 2015-03-23 DIAGNOSIS — Z8659 Personal history of other mental and behavioral disorders: Secondary | ICD-10-CM | POA: Insufficient documentation

## 2015-03-23 DIAGNOSIS — Z8679 Personal history of other diseases of the circulatory system: Secondary | ICD-10-CM | POA: Insufficient documentation

## 2015-03-23 DIAGNOSIS — Z79899 Other long term (current) drug therapy: Secondary | ICD-10-CM | POA: Insufficient documentation

## 2015-03-23 DIAGNOSIS — Z862 Personal history of diseases of the blood and blood-forming organs and certain disorders involving the immune mechanism: Secondary | ICD-10-CM | POA: Insufficient documentation

## 2015-03-23 DIAGNOSIS — Z590 Homelessness: Secondary | ICD-10-CM | POA: Insufficient documentation

## 2015-03-23 DIAGNOSIS — R51 Headache: Secondary | ICD-10-CM | POA: Diagnosis present
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
## 2015-03-23 NOTE — ED Notes (Signed)
Pt reports "feeling like all my words were running together, like I had adhesive in my mouth" last week., reports feeling was intermittent

## 2015-03-23 NOTE — ED Notes (Signed)
Pt in c/o headache that started last night, states she took two ibuprofen around 10am and pain improved, pt does not feel like she needs anything else for pain but wanted to be check out.

## 2015-03-24 LAB — I-STAT CHEM 8, ED
BUN: 8 mg/dL (ref 6–20)
CALCIUM ION: 1.14 mmol/L (ref 1.12–1.23)
CHLORIDE: 102 mmol/L (ref 101–111)
CREATININE: 0.8 mg/dL (ref 0.44–1.00)
Glucose, Bld: 94 mg/dL (ref 65–99)
HCT: 37 % (ref 36.0–46.0)
Hemoglobin: 12.6 g/dL (ref 12.0–15.0)
Potassium: 3.2 mmol/L — ABNORMAL LOW (ref 3.5–5.1)
Sodium: 140 mmol/L (ref 135–145)
TCO2: 21 mmol/L (ref 0–100)

## 2015-03-24 MED ORDER — POTASSIUM CHLORIDE CRYS ER 20 MEQ PO TBCR: 20.0000 meq | EXTENDED_RELEASE_TABLET | Freq: Two times a day (BID) | ORAL | Status: DC

## 2015-03-24 MED ORDER — POTASSIUM CHLORIDE CRYS ER 20 MEQ PO TBCR
40.0000 meq | EXTENDED_RELEASE_TABLET | Freq: Once | ORAL | Status: AC
  Administered 2015-03-24: 40 meq via ORAL
  Filled 2015-03-24: qty 2

## 2015-03-24 NOTE — Discharge Instructions (Signed)
Take the potassium pills daily. Try to get a primary care doctor. Recheck as needed.     Potassium Content of Foods Potassium is a mineral found in many foods and drinks. It helps keep fluids and minerals balanced in your body and affects how steadily your heart beats. Potassium also helps control your blood pressure and keep your muscles and nervous system healthy. Certain health conditions and medicines may change the balance of potassium in your body. When this happens, you can help balance your level of potassium through the foods that you do or do not eat. Your health care provider or dietitian may recommend an amount of potassium that you should have each day. The following lists of foods provide the amount of potassium (in parentheses) per serving in each item. HIGH IN POTASSIUM  The following foods and beverages have 200 mg or more of potassium per serving:  Apricots, 2 raw or 5 dry (200 mg).  Artichoke, 1 medium (345 mg).  Avocado, raw,  each (245 mg).  Banana, 1 medium (425 mg).  Beans, lima, or baked beans, canned,  cup (280 mg).  Beans, white, canned,  cup (595 mg).  Beef roast, 3 oz (320 mg).  Beef, ground, 3 oz (270 mg).  Beets, raw or cooked,  cup (260 mg).  Bran muffin, 2 oz (300 mg).  Broccoli,  cup (230 mg).  Brussels sprouts,  cup (250 mg).  Cantaloupe,  cup (215 mg).  Cereal, 100% bran,  cup (200-400 mg).  Cheeseburger, single, fast food, 1 each (225-400 mg).  Chicken, 3 oz (220 mg).  Clams, canned, 3 oz (535 mg).  Crab, 3 oz (225 mg).  Dates, 5 each (270 mg).  Dried beans and peas,  cup (300-475 mg).  Figs, dried, 2 each (260 mg).  Fish: halibut, tuna, cod, snapper, 3 oz (480 mg).  Fish: salmon, haddock, swordfish, perch, 3 oz (300 mg).  Fish, tuna, canned 3 oz (200 mg).  [place] fries, fast food, 3 oz (470 mg).  Granola with fruit and nuts,  cup (200 mg).  Grapefruit juice,  cup (200 mg).  Greens, beet,  cup (655  mg).  Honeydew melon,  cup (200 mg).  Kale, raw, 1 cup (300 mg).  Kiwi, 1 medium (240 mg).  Kohlrabi, rutabaga, parsnips,  cup (280 mg).  Lentils,  cup (365 mg).  Mango, 1 each (325 mg).  Milk, chocolate, 1 cup (420 mg).  Milk: nonfat, low-fat, whole, buttermilk, 1 cup (350-380 mg).  Molasses, 1 Tbsp (295 mg).  Mushrooms,  cup (280) mg.  Nectarine, 1 each (275 mg).  Nuts: almonds, peanuts, hazelnuts, [place], cashew, mixed, 1 oz (200 mg).  Nuts, pistachios, 1 oz (295 mg).  Orange, 1 each (240 mg).  Orange juice,  cup (235 mg).  Papaya, medium,  fruit (390 mg).  Peanut butter, chunky, 2 Tbsp (240 mg).  Peanut butter, smooth, 2 Tbsp (210 mg).  Pear, 1 medium (200 mg).  Pomegranate, 1 whole (400 mg).  Pomegranate juice,  cup (215 mg).  Pork, 3 oz (350 mg).  Potato chips, salted, 1 oz (465 mg).  Potato, baked with skin, 1 medium (925 mg).  Potatoes, boiled,  cup (255 mg).  Potatoes, mashed,  cup (330 mg).  Prune juice,  cup (370 mg).  Prunes, 5 each (305 mg).  Pudding, chocolate,  cup (230 mg).  Pumpkin, canned,  cup (250 mg).  Raisins, seedless,  cup (270 mg).  Seeds, sunflower or pumpkin, 1 oz (240 mg).  Soy  milk, 1 cup (300 mg).  Spinach,  cup (420 mg).  Spinach, canned,  cup (370 mg).  Sweet potato, baked with skin, 1 medium (450 mg).  Swiss chard,  cup (480 mg).  Tomato or vegetable juice,  cup (275 mg).  Tomato sauce or puree,  cup (400-550 mg).  Tomato, raw, 1 medium (290 mg).  Tomatoes, canned,  cup (200-300 mg).  [place], 3 oz (250 mg).  Wheat germ, 1 oz (250 mg).  Winter squash,  cup (250 mg).  Yogurt, plain or fruited, 6 oz (260-435 mg).  Zucchini,  cup (220 mg). MODERATE IN POTASSIUM The following foods and beverages have 50-200 mg of potassium per serving:  Apple, 1 each (150 mg).  Apple juice,  cup (150 mg).  Applesauce,  cup (90 mg).  Apricot nectar,  cup (140 mg).  Asparagus,  small spears,  cup or 6 spears (155 mg).  Bagel, cinnamon raisin, 1 each (130 mg).  Bagel, egg or plain, 4 in., 1 each (70 mg).  Beans, green,  cup (90 mg).  Beans, yellow,  cup (190 mg).  Beer, regular, 12 oz (100 mg).  Beets, canned,  cup (125 mg).  Blackberries,  cup (115 mg).  Blueberries,  cup (60 mg).  Bread, whole wheat, 1 slice (70 mg).  Broccoli, raw,  cup (145 mg).  Cabbage,  cup (150 mg).  Carrots, cooked or raw,  cup (180 mg).  Cauliflower, raw,  cup (150 mg).  Celery, raw,  cup (155 mg).  Cereal, bran flakes, cup (120-150 mg).  Cheese, cottage,  cup (110 mg).  Cherries, 10 each (150 mg).  Chocolate, 1 oz bar (165 mg).  Coffee, brewed 6 oz (90 mg).  Corn,  cup or 1 ear (195 mg).  Cucumbers,  cup (80 mg).  Egg, large, 1 each (60 mg).  Eggplant,  cup (60 mg).  Endive, raw, cup (80 mg).  English muffin, 1 each (65 mg).  Fish, orange roughy, 3 oz (150 mg).  Frankfurter, beef or pork, 1 each (75 mg).  Fruit cocktail,  cup (115 mg).  Grape juice,  cup (170 mg).  Grapefruit,  fruit (175 mg).  Grapes,  cup (155 mg).  Greens: kale, turnip, collard,  cup (110-150 mg).  Ice cream or frozen yogurt, chocolate,  cup (175 mg).  Ice cream or frozen yogurt, vanilla,  cup (120-150 mg).  Lemons, limes, 1 each (80 mg).  Lettuce, all types, 1 cup (100 mg).  Mixed vegetables,  cup (150 mg).  Mushrooms, raw,  cup (110 mg).  Nuts: walnuts, pecans, or macadamia, 1 oz (125 mg).  Oatmeal,  cup (80 mg).  Okra,  cup (110 mg).  Onions, raw,  cup (120 mg).  Peach, 1 each (185 mg).  Peaches, canned,  cup (120 mg).  Pears, canned,  cup (120 mg).  Peas, green, frozen,  cup (90 mg).  Peppers, green,  cup (130 mg).  Peppers, red,  cup (160 mg).  Pineapple juice,  cup (165 mg).  Pineapple, fresh or canned,  cup (100 mg).  Plums, 1 each (105 mg).  Pudding, vanilla,  cup (150 mg).  Raspberries,  cup  (90 mg).  Rhubarb,  cup (115 mg).  Rice, wild,  cup (80 mg).  Shrimp, 3 oz (155 mg).  Spinach, raw, 1 cup (170 mg).  Strawberries,  cup (125 mg).  Summer squash  cup (175-200 mg).  Swiss chard, raw, 1 cup (135 mg).  Tangerines, 1 each (140 mg).  Tea, brewed, 6  oz (65 mg).  Turnips,  cup (140 mg).  Watermelon,  cup (85 mg).  Wine, red, table, 5 oz (180 mg).  Wine, white, table, 5 oz (100 mg). LOW IN POTASSIUM The following foods and beverages have less than 50 mg of potassium per serving.  Bread, white, 1 slice (30 mg).  Carbonated beverages, 12 oz (less than 5 mg).  Cheese, 1 oz (20-30 mg).  Cranberries,  cup (45 mg).  Cranberry juice cocktail,  cup (20 mg).  Fats and oils, 1 Tbsp (less than 5 mg).  Hummus, 1 Tbsp (32 mg).  Nectar: papaya, mango, or pear,  cup (35 mg).  Rice, white or brown,  cup (50 mg).  Spaghetti or macaroni,  cup cooked (30 mg).  Tortilla, flour or corn, 1 each (50 mg).  Waffle, 4 in., 1 each (50 mg).  Water chestnuts,  cup (40 mg). Document Released: 06/01/2005 Document Revised: 10/23/2013 Document Reviewed: 09/14/2013 [facility] Patient Information 2015 [place], [place]. This information is not intended to replace advice given to you by your health care provider. Make sure you discuss any questions you have with your health care provider.  Hypokalemia Hypokalemia means that the amount of potassium in the blood is lower than normal.Potassium is a chemical, called an electrolyte, that helps regulate the amount of fluid in the body. It also stimulates muscle contraction and helps nerves function properly.Most of the body's potassium is inside of cells, and only a very small amount is in the blood. Because the amount in the blood is so small, minor changes can be life-threatening. CAUSES  Antibiotics.  Diarrhea or vomiting.  Using laxatives too much, which can cause diarrhea.  Chronic kidney disease.  Water pills  (diuretics).  Eating disorders (bulimia).  Low magnesium level.  Sweating a lot. SIGNS AND SYMPTOMS  Weakness.  Constipation.  Fatigue.  Muscle cramps.  Mental confusion.  Skipped heartbeats or irregular heartbeat (palpitations).  Tingling or numbness. DIAGNOSIS  Your health care provider can diagnose hypokalemia with blood tests. In addition to checking your potassium level, your health care provider may also check other lab tests. TREATMENT Hypokalemia can be treated with potassium supplements taken by mouth or adjustments in your current medicines. If your potassium level is very low, you may need to get potassium through a vein (IV) and be monitored in the hospital. A diet high in potassium is also helpful. Foods high in potassium are:  Nuts, such as peanuts and pistachios.  Seeds, such as sunflower seeds and pumpkin seeds.  Peas, lentils, and lima beans.  Whole grain and bran cereals and breads.  Fresh fruit and vegetables, such as apricots, avocado, bananas, cantaloupe, kiwi, oranges, tomatoes, asparagus, and potatoes.  Orange and tomato juices.  Red meats.  Fruit yogurt. HOME CARE INSTRUCTIONS  Take all medicines as prescribed by your health care provider.  Maintain a healthy diet by including nutritious food, such as fruits, vegetables, nuts, whole grains, and lean meats.  If you are taking a laxative, be sure to follow the directions on the label. SEEK MEDICAL CARE IF:  Your weakness gets worse.  You feel your heart pounding or racing.  You are vomiting or having diarrhea.  You are diabetic and having trouble keeping your blood glucose in the normal range. SEEK IMMEDIATE MEDICAL CARE IF:  You have chest pain, shortness of breath, or dizziness.  You are vomiting or having diarrhea for more than 2 days.  You faint. MAKE SURE YOU:   Understand these instructions.  Will watch your condition.  Will get help right away if you are not doing  well or get worse. Document Released: 10/18/2005 Document Revised: 08/08/2013 Document Reviewed: 04/20/2013 [facility] Patient Information 2015 [place], [place]. This information is not intended to replace advice given to you by your health care provider. Make sure you discuss any questions you have with your health care provider.

## 2015-03-25 ENCOUNTER — Encounter (HOSPITAL_COMMUNITY): Payer: Self-pay | Admitting: Cardiology

## 2015-03-25 ENCOUNTER — Emergency Department (HOSPITAL_COMMUNITY)
Admission: EM | Admit: 2015-03-25 | Discharge: 2015-03-25 | Disposition: A | Discharge status: Home / Self Care | Payer: Medicaid Other | Attending: Emergency Medicine | Admitting: Emergency Medicine

## 2015-03-25 DIAGNOSIS — Z720 Tobacco use: Secondary | ICD-10-CM | POA: Insufficient documentation

## 2015-03-25 DIAGNOSIS — R42 Dizziness and giddiness: Secondary | ICD-10-CM | POA: Diagnosis present

## 2015-03-25 DIAGNOSIS — Z79899 Other long term (current) drug therapy: Secondary | ICD-10-CM | POA: Insufficient documentation

## 2015-03-25 DIAGNOSIS — Z590 Homelessness: Secondary | ICD-10-CM | POA: Insufficient documentation

## 2015-03-25 DIAGNOSIS — R109 Unspecified abdominal pain: Secondary | ICD-10-CM | POA: Insufficient documentation

## 2015-03-25 DIAGNOSIS — R531 Weakness: Secondary | ICD-10-CM | POA: Diagnosis not present

## 2015-03-25 DIAGNOSIS — Z8659 Personal history of other mental and behavioral disorders: Secondary | ICD-10-CM | POA: Insufficient documentation

## 2015-03-25 LAB — CBC WITH DIFFERENTIAL/PLATELET
Basophils Absolute: 0 10*3/uL (ref 0.0–0.1)
Basophils Relative: 0 % (ref 0–1)
EOS ABS: 0.1 10*3/uL (ref 0.0–0.7)
Eosinophils Relative: 3 % (ref 0–5)
HCT: 36.1 % (ref 36.0–46.0)
Hemoglobin: 12.5 g/dL (ref 12.0–15.0)
LYMPHS ABS: 1.7 10*3/uL (ref 0.7–4.0)
Lymphocytes Relative: 33 % (ref 12–46)
MCH: 28.7 pg (ref 26.0–34.0)
MCHC: 34.6 g/dL (ref 30.0–36.0)
MCV: 82.8 fL (ref 78.0–100.0)
MONO ABS: 0.3 10*3/uL (ref 0.1–1.0)
MONOS PCT: 6 % (ref 3–12)
Neutro Abs: 2.9 10*3/uL (ref 1.7–7.7)
Neutrophils Relative %: 58 % (ref 43–77)
Platelets: 194 10*3/uL (ref 150–400)
RBC: 4.36 MIL/uL (ref 3.87–5.11)
RDW: 13.5 % (ref 11.5–15.5)
WBC: 5 10*3/uL (ref 4.0–10.5)

## 2015-03-25 LAB — COMPREHENSIVE METABOLIC PANEL
ALK PHOS: 46 U/L (ref 38–126)
ALT: 12 U/L — ABNORMAL LOW (ref 14–54)
AST: 18 U/L (ref 15–41)
Albumin: 3.9 g/dL (ref 3.5–5.0)
Anion gap: 9 (ref 5–15)
BUN: 5 mg/dL — AB (ref 6–20)
CO2: 25 mmol/L (ref 22–32)
CREATININE: 0.77 mg/dL (ref 0.44–1.00)
Calcium: 8.9 mg/dL (ref 8.9–10.3)
Chloride: 105 mmol/L (ref 101–111)
GFR calc Af Amer: >60 mL/min (ref >60)
GLUCOSE: 110 mg/dL — AB (ref 65–99)
Potassium: 3.9 mmol/L (ref 3.5–5.1)
SODIUM: 139 mmol/L (ref 135–145)
Total Bilirubin: 0.8 mg/dL (ref 0.3–1.2)
Total Protein: 6.2 g/dL — ABNORMAL LOW (ref 6.5–8.1)

## 2015-03-25 NOTE — Discharge Instructions (Signed)
Dizziness °Dizziness is a common problem. It is a feeling of unsteadiness or light-headedness. You may feel like you are about to faint. Dizziness can lead to injury if you stumble or fall. A person of any age group can suffer from dizziness, but dizziness is more common in older adults. °CAUSES  °Dizziness can be caused by many different things, including: °· Middle ear problems. °· Standing for too long. °· Infections. °· An allergic reaction. °· Aging. °· An emotional response to something, such as the sight of blood. °· Side effects of medicines. °· Tiredness. °· Problems with circulation or blood pressure. °· Excessive use of alcohol or medicines, or illegal drug use. °· Breathing too fast (hyperventilation). °· An irregular heart rhythm (arrhythmia). °· A low red blood cell count (anemia). °· Pregnancy. °· Vomiting, diarrhea, fever, or other illnesses that cause body fluid loss (dehydration). °· Diseases or conditions such as Parkinson's disease, high blood pressure (hypertension), diabetes, and thyroid problems. °· Exposure to extreme heat. °DIAGNOSIS  °Your health care provider will ask about your symptoms, perform a physical exam, and perform an electrocardiogram (ECG) to record the electrical activity of your heart. Your health care provider may also perform other heart or blood tests to determine the cause of your dizziness. These may include: °· Transthoracic echocardiogram (TTE). During echocardiography, sound waves are used to evaluate how blood flows through your heart. °· Transesophageal echocardiogram (TEE). °· Cardiac monitoring. This allows your health care provider to monitor your heart rate and rhythm in real time. °· Holter monitor. This is a portable device that records your heartbeat and can help diagnose heart arrhythmias. It allows your health care provider to track your heart activity for several days if needed. °· Stress tests by exercise or by giving medicine that makes the heart beat  faster. °TREATMENT  °Treatment of dizziness depends on the cause of your symptoms and can vary greatly. °HOME CARE INSTRUCTIONS  °· Drink enough fluids to keep your urine clear or pale yellow. This is especially important in very hot weather. In older adults, it is also important in cold weather. °· Take your medicine exactly as directed if your dizziness is caused by medicines. When taking blood pressure medicines, it is especially important to get up slowly. °¨ Rise slowly from chairs and steady yourself until you feel okay. °¨ In the morning, first sit up on the side of the bed. When you feel okay, stand slowly while holding onto something until you know your balance is fine. °· Move your legs often if you need to stand in one place for a long time. Tighten and relax your muscles in your legs while standing. °· Have someone stay with you for 1-2 days if dizziness continues to be a problem. Do this until you feel you are well enough to stay alone. Have the person call your health care provider if he or she notices changes in you that are concerning. °· Do not drive or use heavy machinery if you feel dizzy. °· Do not drink alcohol. °SEEK IMMEDIATE MEDICAL CARE IF:  °· Your dizziness or light-headedness gets worse. °· You feel nauseous or vomit. °· You have problems talking, walking, or using your arms, hands, or legs. °· You feel weak. °· You are not thinking clearly or you have trouble forming sentences. It may take a friend or family member to notice this. °· You have chest pain, abdominal pain, shortness of breath, or sweating. °· Your vision changes. °· You notice   any bleeding.  You have side effects from medicine that seems to be getting worse rather than better. MAKE SURE YOU:   Understand these instructions.  Will watch your condition.  Will get help right away if you are not doing well or get worse. Document Released: 04/13/2001 Document Revised: 10/23/2013 Document Reviewed: 05/07/2011 [facility]  Patient Information 2015 [place], [place]. This information is not intended to replace advice given to you by your health care provider. Make sure you discuss any questions you have with your health care provider.  Please follow-up with [facility] and wellness for further evaluation and management of chronic conditions.

## 2015-03-25 NOTE — ED Provider Notes (Signed)
CSN: [id number]     Arrival date & time 03/25/15  2542 History   First MD Initiated Contact with Patient 03/25/15 1240     Chief Complaint  Patient presents with  . Fatigue  . Dizziness  . Abdominal Pain    HPI   44 year old female presents today with dizziness. Patient reports a intermittent two-year history of dizziness for she's been seen previously. Patient reports she was seen 2 days ago for the same found to be hypokalemic given a prescription for potassium. She reports that the symptoms have not dissipated since then, also reports that she has not filled or taken the potassium. She reports that when she was reading her TVS summary indicated warning signs for return to the emergency room and she is concerned that she has some of those including weakness, dizziness, or neurological deficits. Patient reports that this is unchanged for the past 2 years, no worse, no syncope, no nausea and vomiting, headache. She reports that she is "dizzy" but denies spinning sensation, and reports it's not made worse or better by anything. Nursing notes report some abdominal pain, patient denied this during my evaluation. Patient denied any other Concerns at today's visit other than those noted above.  Past Medical History  Diagnosis Date  . Palpitations   . Psychosis   . BIPOLAR DISORDER   . Substance abuse   . Delusions   . Homelessness   . Left ventricular hypertrophy   . Sickle cell trait   . Homelessness    Past Surgical History  Procedure Laterality Date  . Bunionectomy     Family History  Problem Relation Age of Onset  . Heart disease Mother     CHF   History  Substance Use Topics  . Smoking status: Current Every Day Smoker -- 1.00 packs/day for 10 years    Types: Cigarettes  . Smokeless tobacco: Not on file  . Alcohol Use: No   OB History    Gravida Para Term Preterm AB TAB SAB Ectopic Multiple Living   1              Review of Systems  All other systems reviewed and are  negative.     Allergies  Strawberry and Risperidone and related  Home Medications   Prior to Admission medications   Medication Sig Start Date End Date Taking? Authorizing Provider  ibuprofen (ADVIL,MOTRIN) 800 MG tablet Take 1 tablet (800 mg total) by mouth every 8 (eight) hours as needed for mild pain. 02/01/15  Yes Kristen N Ward, DO  nitroGLYCERIN (NITROSTAT) 0.4 MG SL tablet Place 0.4 mg under the tongue every 5 (five) minutes as needed for chest pain.   Yes Historical Provider, MD  potassium chloride SA (K-DUR,KLOR-CON) 20 MEQ tablet Take 1 tablet (20 mEq total) by mouth 2 (two) times daily. 03/24/15   Rolland Porter, MD   BP 102/69 mmHg  Pulse 63  Temp(Src) 98 F (36.7 C) (Oral)  Resp 18  Ht 5\' 5"  (1.651 m)  Wt 140 lb (63.504 kg)  BMI 23.30 kg/m2  SpO2 99%  LMP 03/16/2015 Physical Exam  Constitutional: She is oriented to person, place, and time. She appears well-developed and well-nourished.  HENT:  Head: Normocephalic and atraumatic.  Eyes: Conjunctivae are normal. Pupils are equal, round, and reactive to light. Right eye exhibits no discharge. Left eye exhibits no discharge. No scleral icterus.  Neck: Normal range of motion. No JVD present. No tracheal deviation present.  Cardiovascular: Normal rate, regular rhythm  and normal heart sounds.   Pulmonary/Chest: Effort normal and breath sounds normal. No stridor. No respiratory distress. She has no wheezes. She has no rales. She exhibits no tenderness.  Abdominal: Soft. She exhibits no distension and no mass. There is no tenderness. There is no rebound and no guarding.  Neurological: She is alert and oriented to person, place, and time. She has normal strength. She displays no atrophy and no tremor. No cranial nerve deficit or sensory deficit. She exhibits normal muscle tone. She displays no seizure activity. Coordination normal. GCS eye subscore is 4. GCS verbal subscore is 5. GCS motor subscore is 6.  Psychiatric: She has a normal  mood and affect. Her behavior is normal. Judgment and thought content normal.  Nursing note and vitals reviewed.   ED Course  Procedures (including critical care time) Labs Review Labs Reviewed  COMPREHENSIVE METABOLIC PANEL - Abnormal; Notable for the following:    Glucose, Bld 110 (*)    BUN 5 (*)    Total Protein 6.2 (*)    ALT 12 (*)    All other components within normal limits  CBC WITH DIFFERENTIAL/PLATELET    Imaging Review No results found.   EKG Interpretation None      MDM   Final diagnoses:  Dizziness    Labs: CBC, CMP no significant findings  Imaging: None indicated  Consults: None  Therapeutics: None  Assessment: Dizziness  Plan: Patient presents with dizziness, this is been intermittent for the last 2 years. Patient reports that she was seen yesterday for the same, she was diagnosed with hypokalemia and given a prescription for potassium. She reports she did not fill the prescription, reports that she attempt follow-up with [facility] wellness but walked by the building and did not think that that was the right building. Instead she felt it necessary to come to the emergency room as her AVS summary indicated she return if she was experiencing dizziness. This is unchanged from previous episodes, she has no associated symptoms that would indicate further evaluation or management for any sort of ACS, pulmonary or neurological intervention. Patient did not appear dehydrated with moist mucous membranes, dizziness was not exacerbated by any acute activity including ambulation, sitting to standing, or head movements. No history of trauma. Nursing notes report mention of abdominal pain, patient denies this, benign abdominal exam. Patient was informed that her potassium was within normal limits today, she was encouraged to follow up with her primary care provider or [place] wellness for management of chronic complaints. She was given strict return precautions, she  verbalized her understanding and agreement for today's plan and no further questions or concerns at the time of discharge.       Okey Regal, PA-C 03/25/15 [place], MD 03/26/15 [phone]

## 2015-03-25 NOTE — ED Notes (Addendum)
Pt reports she was here 2 days ago and dx with low hypokalemia. Given a Rx but did not fill it yet. Reports that she is still having symptoms of headache and dizziness. Told to return if not feeling better. Also reports some abd pain.

## 2015-03-29 ENCOUNTER — Emergency Department (HOSPITAL_COMMUNITY)
Admission: EM | Admit: 2015-03-29 | Discharge: 2015-03-29 | Disposition: A | Discharge status: Home / Self Care | Payer: Medicaid Other | Attending: Emergency Medicine | Admitting: Emergency Medicine

## 2015-03-29 ENCOUNTER — Emergency Department (HOSPITAL_COMMUNITY): Payer: Medicaid Other

## 2015-03-29 ENCOUNTER — Encounter (HOSPITAL_COMMUNITY): Payer: Self-pay | Admitting: Emergency Medicine

## 2015-03-29 DIAGNOSIS — Z590 Homelessness: Secondary | ICD-10-CM | POA: Insufficient documentation

## 2015-03-29 DIAGNOSIS — Z862 Personal history of diseases of the blood and blood-forming organs and certain disorders involving the immune mechanism: Secondary | ICD-10-CM | POA: Diagnosis not present

## 2015-03-29 DIAGNOSIS — Z8659 Personal history of other mental and behavioral disorders: Secondary | ICD-10-CM | POA: Diagnosis not present

## 2015-03-29 DIAGNOSIS — R001 Bradycardia, unspecified: Secondary | ICD-10-CM | POA: Insufficient documentation

## 2015-03-29 DIAGNOSIS — R0789 Other chest pain: Secondary | ICD-10-CM

## 2015-03-29 DIAGNOSIS — Z720 Tobacco use: Secondary | ICD-10-CM | POA: Insufficient documentation

## 2015-03-29 DIAGNOSIS — R079 Chest pain, unspecified: Secondary | ICD-10-CM | POA: Diagnosis present

## 2015-03-29 DIAGNOSIS — R002 Palpitations: Secondary | ICD-10-CM | POA: Diagnosis not present

## 2015-03-29 LAB — CBC WITH DIFFERENTIAL/PLATELET
BASOS ABS: 0 10*3/uL (ref 0.0–0.1)
Basophils Relative: 1 % (ref 0–1)
EOS ABS: 0.1 10*3/uL (ref 0.0–0.7)
Eosinophils Relative: 3 % (ref 0–5)
HCT: 33.7 % — ABNORMAL LOW (ref 36.0–46.0)
Hemoglobin: 11.7 g/dL — ABNORMAL LOW (ref 12.0–15.0)
LYMPHS PCT: 39 % (ref 12–46)
Lymphs Abs: 1.6 10*3/uL (ref 0.7–4.0)
MCH: 28.8 pg (ref 26.0–34.0)
MCHC: 34.7 g/dL (ref 30.0–36.0)
MCV: 83 fL (ref 78.0–100.0)
Monocytes Absolute: 0.4 10*3/uL (ref 0.1–1.0)
Monocytes Relative: 9 % (ref 3–12)
Neutro Abs: 2 10*3/uL (ref 1.7–7.7)
Neutrophils Relative %: 48 % (ref 43–77)
PLATELETS: 183 10*3/uL (ref 150–400)
RBC: 4.06 MIL/uL (ref 3.87–5.11)
RDW: 13.7 % (ref 11.5–15.5)
WBC: 4.2 10*3/uL (ref 4.0–10.5)

## 2015-03-29 LAB — BASIC METABOLIC PANEL
Anion gap: 10 (ref 5–15)
BUN: 7 mg/dL (ref 6–20)
CALCIUM: 9 mg/dL (ref 8.9–10.3)
CO2: 24 mmol/L (ref 22–32)
CREATININE: 0.83 mg/dL (ref 0.44–1.00)
Chloride: 105 mmol/L (ref 101–111)
GFR calc non Af Amer: >60 mL/min (ref >60)
Glucose, Bld: 94 mg/dL (ref 65–99)
POTASSIUM: 3.4 mmol/L — AB (ref 3.5–5.1)
Sodium: 139 mmol/L (ref 135–145)

## 2015-03-29 LAB — I-STAT TROPONIN, ED: TROPONIN I, POC: 0.01 ng/mL (ref 0.00–0.08)

## 2015-03-29 MED ORDER — KETOROLAC TROMETHAMINE 60 MG/2ML IM SOLN
60.0000 mg | Freq: Once | INTRAMUSCULAR | Status: DC
  Filled 2015-03-29: qty 2

## 2015-03-29 MED ORDER — HYDROCODONE-ACETAMINOPHEN 5-325 MG PO TABS
1.0000 | ORAL_TABLET | Freq: Once | ORAL | Status: AC
  Administered 2015-03-29: 1 via ORAL
  Filled 2015-03-29: qty 1

## 2015-03-29 NOTE — Discharge Instructions (Signed)
Chest Pain (Nonspecific) It is often hard to give a diagnosis for the cause of chest pain. There is always a chance that your pain could be related to something serious, such as a heart attack or a blood clot in the lungs. You need to follow up with your doctor. HOME CARE  If antibiotic medicine was given, take it as directed by your doctor. Finish the medicine even if you start to feel better.  For the next few days, avoid activities that bring on chest pain. Continue physical activities as told by your doctor.  Do not use any tobacco products. This includes cigarettes, chewing tobacco, and e-cigarettes.  Avoid drinking alcohol.  Only take medicine as told by your doctor.  Follow your doctor's suggestions for more testing if your chest pain does not go away.  Keep all doctor visits you made. GET HELP IF:  Your chest pain does not go away, even after treatment.  You have a rash with blisters on your chest.  You have a fever. GET HELP RIGHT AWAY IF:   You have more pain or pain that spreads to your arm, neck, jaw, back, or belly (abdomen).  You have shortness of breath.  You cough more than usual or cough up blood.  You have very bad back or belly pain.  You feel sick to your stomach (nauseous) or throw up (vomit).  You have very bad weakness.  You pass out (faint).  You have chills. This is an emergency. Do not wait to see if the problems will go away. Call your local emergency services (911 in [place] Do not drive yourself to the hospital. MAKE SURE YOU:   Understand these instructions.  Will watch your condition.  Will get help right away if you are not doing well or get worse. Document Released: 04/05/2008 Document Revised: 10/23/2013 Document Reviewed: 04/05/2008 [facility] Patient Information 2015 [place], [place]. This information is not intended to replace advice given to you by your health care provider. Make sure you discuss any questions you have with your  health care provider.   Palpitations A palpitation is the feeling that your heartbeat is irregular. It may feel like your heart is fluttering or skipping a beat. It may also feel like your heart is beating faster than normal. This is usually not a serious problem. In some cases, you may need more medical tests. HOME CARE  Avoid:  Caffeine in coffee, tea, soft drinks, diet pills, and energy drinks.  Chocolate.  Alcohol.  Stop smoking if you smoke.  Reduce your stress and anxiety. Try:  A method that measures bodily functions so you can learn to control them (biofeedback).  Yoga.  Meditation.  Physical activity such as swimming, jogging, or walking.  Get plenty of rest and sleep. GET HELP IF:  Your fast or irregular heartbeat continues after 24 hours.  Your palpitations occur more often. GET HELP RIGHT AWAY IF:   You have chest pain.  You feel short of breath.  You have a very bad headache.  You feel dizzy or pass out (faint). MAKE SURE YOU:   Understand these instructions.  Will watch your condition.  Will get help right away if you are not doing well or get worse. Document Released: 07/27/2008 Document Revised: 03/04/2014 Document Reviewed: 12/17/2011 [facility] Patient Information 2015 [place], [place]. This information is not intended to replace advice given to you by your health care provider. Make sure you discuss any questions you have with your health care provider.

## 2015-03-29 NOTE — ED Provider Notes (Signed)
CSN: [id number]     Arrival date & time 03/29/15  5465 History   First MD Initiated Contact with Patient 03/29/15 [phone]     Chief Complaint  Patient presents with  . Chest Pain  . Palpitations     (Consider location/radiation/quality/duration/timing/severity/associated sxs/prior Treatment) HPI Comments:  Pt is a 44 y.o. female with Pmhx as above who presents with about 4 hours of painful irregular palpitations starting at rest this morning, which have been intermittent as well as sharp pains in her bilateral arms.  She's had associated shortness of breath.  She denies nausea, vomiting or diaphoresis.  She says she has had similar symptoms for approximately one year.  She also has intermittent jerking of her bilateral legs for about one year, which occurs mostly at night.  She reports having a stress test approximately 2 years ago and sees Dr. Lennice Sites with cardiology in [facility].  Patient is a 44 y.o. female presenting with chest pain and palpitations.  Chest Pain Associated symptoms: palpitations   Associated symptoms: no abdominal pain, no back pain, no cough, no diaphoresis, no fatigue, no fever, no headache, no nausea, no numbness, no shortness of breath, not vomiting and no weakness   Palpitations Associated symptoms: chest pain   Associated symptoms: no back pain, no cough, no diaphoresis, no nausea, no numbness, no shortness of breath, no vomiting and no weakness     Past Medical History  Diagnosis Date  . Palpitations   . Psychosis   . BIPOLAR DISORDER   . Substance abuse   . Delusions   . Homelessness   . Left ventricular hypertrophy   . Sickle cell trait   . Homelessness    Past Surgical History  Procedure Laterality Date  . Bunionectomy     Family History  Problem Relation Age of Onset  . Heart disease Mother     CHF   History  Substance Use Topics  . Smoking status: Current Every Day Smoker -- 1.00 packs/day for 10 years    Types: Cigarettes  . Smokeless  tobacco: Not on file  . Alcohol Use: No   OB History    Gravida Para Term Preterm AB TAB SAB Ectopic Multiple Living   1              Review of Systems  Constitutional: Negative for fever, chills, diaphoresis, activity change, appetite change and fatigue.  HENT: Negative for congestion, facial swelling, rhinorrhea and sore throat.   Eyes: Negative for photophobia and discharge.  Respiratory: Negative for cough, chest tightness and shortness of breath.   Cardiovascular: Positive for chest pain and palpitations. Negative for leg swelling.  Gastrointestinal: Negative for nausea, vomiting, abdominal pain and diarrhea.  Endocrine: Negative for polydipsia and polyuria.  Genitourinary: Negative for dysuria, frequency, difficulty urinating and pelvic pain.  Musculoskeletal: Negative for back pain, arthralgias, neck pain and neck stiffness.  Skin: Negative for color change and wound.  Allergic/Immunologic: Negative for immunocompromised state.  Neurological: Negative for facial asymmetry, weakness, numbness and headaches.  Hematological: Does not bruise/bleed easily.  Psychiatric/Behavioral: Negative for confusion and agitation.      Allergies  Strawberry and Risperidone and related  Home Medications   Prior to Admission medications   Medication Sig Start Date End Date Taking? Authorizing Provider  ibuprofen (ADVIL,MOTRIN) 800 MG tablet Take 1 tablet (800 mg total) by mouth every 8 (eight) hours as needed for mild pain. 02/01/15  Yes Kristen N Ward, DO  nitroGLYCERIN (NITROSTAT) 0.4 MG SL  tablet Place 0.4 mg under the tongue every 5 (five) minutes as needed for chest pain.   Yes Historical Provider, MD  potassium chloride SA (K-DUR,KLOR-CON) 20 MEQ tablet Take 1 tablet (20 mEq total) by mouth 2 (two) times daily. Patient not taking: Reported on 03/29/2015 03/24/15   Rolland Porter, MD   BP 116/75 mmHg  Pulse 65  Temp(Src) 98.4 F (36.9 C) (Oral)  Resp 18  Ht 5\' 5"  (1.651 m)  Wt 140 lb  (63.504 kg)  BMI 23.30 kg/m2  SpO2 99%  LMP 03/16/2015 Physical Exam  Constitutional: She is oriented to person, place, and time. She appears well-developed and well-nourished. No distress.  HENT:  Head: Normocephalic and atraumatic.  Mouth/Throat: No oropharyngeal exudate.  Eyes: Pupils are equal, round, and reactive to light.  Neck: Normal range of motion. Neck supple.  Cardiovascular: Regular rhythm and normal heart sounds.  Bradycardia present.  Exam reveals no gallop and no friction rub.   No murmur heard. Pulmonary/Chest: Effort normal and breath sounds normal. No respiratory distress. She has no wheezes. She has no rales.  Abdominal: Soft. Bowel sounds are normal. She exhibits no distension and no mass. There is no tenderness. There is no rebound and no guarding.  Musculoskeletal: Normal range of motion. She exhibits no edema or tenderness.  Neurological: She is alert and oriented to person, place, and time.  Skin: Skin is warm and dry.  Psychiatric: She has a normal mood and affect.    ED Course  Procedures (including critical care time) Labs Review Labs Reviewed  CBC WITH DIFFERENTIAL/PLATELET - Abnormal; Notable for the following:    Hemoglobin 11.7 (*)    HCT 33.7 (*)    All other components within normal limits  BASIC METABOLIC PANEL - Abnormal; Notable for the following:    Potassium 3.4 (*)    All other components within normal limits  I-STAT TROPOININ, ED    Imaging Review Dg Chest 2 View  03/29/2015   CLINICAL DATA:  Left-sided chest pain with palpitations.  EXAM: CHEST  2 VIEW  COMPARISON:  02/07/2015  FINDINGS: Lungs are adequately inflated without focal consolidation or effusion. Mild prominence of the cardiac silhouette which is likely due to mild true cardiomegaly although cannot exclude a mild degree of pectus excavatum deformity. Remainder of the exam is unchanged.  IMPRESSION: No acute cardiopulmonary disease.  Stable mild cardiomegaly.   Electronically  Signed   By: Marin Olp M.D.   On: 03/29/2015 08:25     EKG Interpretation   Date/Time:  Saturday Mar 29 2015 06:32:41 EDT Ventricular Rate:  64 PR Interval:  156 QRS Duration: 80 QT Interval:  435 QTC Calculation: 449 R Axis:   84 Text Interpretation:  Sinus rhythm Nonspecific T abnrm, anterolateral  leads No significant change since last tracing Confirmed by Kathrynn Humble, MD,  Thelma Comp [phone]) on 03/29/2015 6:38:57 AM      MDM   Final diagnoses:  Palpitations  Atypical chest pain    Pt is a 44 y.o. female with Pmhx as above who presents with about 4 hours of painful irregular palpitations starting at rest this morning, which have been intermittent as well as sharp pains in her bilateral arms.  She's had associated shortness of breath.  She denies nausea, vomiting or diaphoresis.  She says she has had similar symptoms for approximately one year.  She also has intermittent jerking of her bilateral legs for about one year, which occurs mostly at night.  She reports having a  stress test approximately 2 years ago and sees Dr. Lennice Sites with cardiology in [facility].  On physical exam she is mildly bradycardic.  Vital signs are otherwise normal and she appears in no acute distress.  Cardiopulmonary exam is benign.  No lower extremity pain or swelling.  No patient is somewhat vague about the chest pain itself initially timely.  She had no chest pain and then reporting that the palpitations themselves are what is painful.  She's no acute ischemic changes on her EKG she does have sinus bradycardia with unchanged anterior lateral T-wave changes.   Trop negative, CXR nml. BMP with mild hypokalemia (3.4). HEART score is 1, low risk for MACE, TIMII score 0. I feel pt safe for discharge and outpatient follow-up with her cardiologist in [facility].  I recommended that she avoid alcohol, caffeine, chocolate try to get plenty of sleep and drink plenty of fluids.    [name] evaluation in the Emergency  Department is complete. It has been determined that no acute conditions requiring further emergency intervention are present at this time. The patient/guardian have been advised of the diagnosis and plan. We have discussed signs and symptoms that warrant return to the ED, such as changes or worsening in symptoms,  Worsening pain, fever, shortness of breath.       Ernestina Patches, MD 03/29/15 [phone]

## 2015-03-29 NOTE — ED Notes (Signed)
Patient transported to X-ray 

## 2015-03-29 NOTE — ED Notes (Signed)
Pt states she" is having 9/10 left side chest pain going to her left leg and palpitation". No SOB or dizziness.

## 2015-03-29 NOTE — ED Notes (Signed)
Pt reports 3/10 L sided chest pain. States some relief with pain medication. Pt is alert and oriented x4. No signs of distress noted.

## 2015-03-29 NOTE — ED Notes (Signed)
Pt transported to xray 

## 2015-03-31 ENCOUNTER — Encounter (HOSPITAL_COMMUNITY): Payer: Self-pay | Admitting: Emergency Medicine

## 2015-03-31 ENCOUNTER — Emergency Department (HOSPITAL_COMMUNITY)
Admission: EM | Admit: 2015-03-31 | Discharge: 2015-03-31 | Disposition: A | Discharge status: Home / Self Care | Payer: Medicaid Other | Attending: Emergency Medicine | Admitting: Emergency Medicine

## 2015-03-31 DIAGNOSIS — Z0000 Encounter for general adult medical examination without abnormal findings: Secondary | ICD-10-CM | POA: Diagnosis present

## 2015-03-31 DIAGNOSIS — Z590 Homelessness: Secondary | ICD-10-CM | POA: Diagnosis not present

## 2015-03-31 DIAGNOSIS — R5383 Other fatigue: Secondary | ICD-10-CM

## 2015-03-31 DIAGNOSIS — Z8659 Personal history of other mental and behavioral disorders: Secondary | ICD-10-CM | POA: Insufficient documentation

## 2015-03-31 DIAGNOSIS — R011 Cardiac murmur, unspecified: Secondary | ICD-10-CM | POA: Diagnosis not present

## 2015-03-31 DIAGNOSIS — R110 Nausea: Secondary | ICD-10-CM | POA: Insufficient documentation

## 2015-03-31 DIAGNOSIS — Z720 Tobacco use: Secondary | ICD-10-CM | POA: Insufficient documentation

## 2015-03-31 DIAGNOSIS — Z862 Personal history of diseases of the blood and blood-forming organs and certain disorders involving the immune mechanism: Secondary | ICD-10-CM | POA: Diagnosis not present

## 2015-03-31 DIAGNOSIS — R42 Dizziness and giddiness: Secondary | ICD-10-CM | POA: Diagnosis not present

## 2015-03-31 NOTE — Discharge Instructions (Signed)
Your laboratory work up from your previous visit was reviewed. It looks reassuring. Your chest Xray is unchanged from April. Your EKG today is also not concerning. For your complaints, we recommend primary care follow-up. You may continue to try following up with the [facility] If the [facility] is not excepting patients, you may try scheduling a new patient visit with Dr. Zigmund Daniel. Be sure to try and eat regular meals, drink plenty of fluids, and get 6-8 hours of sleep per night.  Fatigue Fatigue is a feeling of tiredness, lack of energy, lack of motivation, or feeling tired all the time. Having enough rest, good nutrition, and reducing stress will normally reduce fatigue. Consult your caregiver if it persists. The nature of your fatigue will help your caregiver to find out its cause. The treatment is based on the cause.  CAUSES  There are many causes for fatigue. Most of the time, fatigue can be traced to one or more of your habits or routines. Most causes fit into one or more of three general areas. They are: Lifestyle problems  Sleep disturbances.  Overwork.  Physical exertion.  Unhealthy habits.  Poor eating habits or eating disorders.  Alcohol and/or drug use .  Lack of proper nutrition (malnutrition). Psychological problems  Stress and/or anxiety problems.  Depression.  Grief.  Boredom. Medical Problems or Conditions  Anemia.  Pregnancy.  Thyroid gland problems.  Recovery from major surgery.  Continuous pain.  Emphysema or asthma that is not well controlled  Allergic conditions.  Diabetes.  Infections (such as mononucleosis).  Obesity.  Sleep disorders, such as sleep apnea.  Heart failure or other heart-related problems.  Cancer.  Kidney disease.  Liver disease.  Effects of certain medicines such as antihistamines, cough and cold remedies, prescription pain medicines, heart and blood pressure medicines, drugs used for treatment of  cancer, and some antidepressants. SYMPTOMS  The symptoms of fatigue include:   Lack of energy.  Lack of drive (motivation).  Drowsiness.  Feeling of indifference to the surroundings. DIAGNOSIS  The details of how you feel help guide your caregiver in finding out what is causing the fatigue. You will be asked about your present and past health condition. It is important to review all medicines that you take, including prescription and non-prescription items. A thorough exam will be done. You will be questioned about your feelings, habits, and normal lifestyle. Your caregiver may suggest blood tests, urine tests, or other tests to look for common medical causes of fatigue.  TREATMENT  Fatigue is treated by correcting the underlying cause. For example, if you have continuous pain or depression, treating these causes will improve how you feel. Similarly, adjusting the dose of certain medicines will help in reducing fatigue.  HOME CARE INSTRUCTIONS   Try to get the required amount of good sleep every night.  Eat a healthy and nutritious diet, and drink enough water throughout the day.  Practice ways of relaxing (including yoga or meditation).  Exercise regularly.  Make plans to change situations that cause stress. Act on those plans so that stresses decrease over time. Keep your work and personal routine reasonable.  Avoid street drugs and minimize use of alcohol.  Start taking a daily multivitamin after consulting your caregiver. SEEK MEDICAL CARE IF:   You have persistent tiredness, which cannot be accounted for.  You have fever.  You have unintentional weight loss.  You have headaches.  You have disturbed sleep throughout the night.  You are feeling sad.  You have constipation.  You have dry skin.  You have gained weight.  You are taking any new or different medicines that you suspect are causing fatigue.  You are unable to sleep at night.  You develop any unusual  swelling of your legs or other parts of your body. SEEK IMMEDIATE MEDICAL CARE IF:   You are feeling confused.  Your vision is blurred.  You feel faint or pass out.  You develop severe headache.  You develop severe abdominal, pelvic, or back pain.  You develop chest pain, shortness of breath, or an irregular or fast heartbeat.  You are unable to pass a normal amount of urine.  You develop abnormal bleeding such as bleeding from the rectum or you vomit blood.  You have thoughts about harming yourself or committing suicide.  You are worried that you might harm someone else. MAKE SURE YOU:   Understand these instructions.  Will watch your condition.  Will get help right away if you are not doing well or get worse. Document Released: 08/15/2007 Document Revised: 01/10/2012 Document Reviewed: 02/19/2014 [facility] Patient Information 2015 [place], [place]. This information is not intended to replace advice given to you by your health care provider. Make sure you discuss any questions you have with your health care provider.   Emergency Department Resource Guide 1) Find a Doctor and Pay Out of Pocket Although you won't have to find out who is covered by your insurance plan, it is a good idea to ask around and get recommendations. You will then need to call the office and see if the doctor you have chosen will accept you as a new patient and what types of options they offer for patients who are self-pay. Some doctors offer discounts or will set up payment plans for their patients who do not have insurance, but you will need to ask so you aren't surprised when you get to your appointment.  2) Contact Your Local Health Department Not all health departments have doctors that can see patients for sick visits, but many do, so it is worth a call to see if yours does. If you don't know where your local health department is, you can check in your phone book. The CDC also has a tool to help you  locate your state's health department, and many state websites also have listings of all of their local health departments.  3) Find a [facility] If your illness is not likely to be very severe or complicated, you may want to try a walk in clinic. These are popping up all over the country in pharmacies, drugstores, and shopping centers. They're usually staffed by nurse practitioners or physician assistants that have been trained to treat common illnesses and complaints. They're usually fairly quick and inexpensive. However, if you have serious medical issues or chronic medical problems, these are probably not your best option.  No Primary Care Doctor: - Call Health Connect at  [phone] - they can help you locate a primary care doctor that  accepts your insurance, provides certain services, etc. - Physician Referral Service- [phone]  Chronic Pain Problems: Organization         Address  Phone   Notes  [facility]  [phone] Patients need to be referred by their primary care doctor.   Medication Assistance: Organization         Address  Phone   Notes  [facility] Medication Assistance Program [address] [place],  [place] [address] [phone] --Must be a resident of [facility] -- Must have NO insurance coverage whatsoever (no Medicaid/ Medicare, etc.) -- The pt. MUST have a primary care doctor that directs their care regularly and follows them in the community   MedAssist  [phone]   [employer]  [phone]    Agencies that provide inexpensive medical care: Organization         Address  Phone   Notes  [place]  [phone]   [facility]    [phone]   [facility] [place], [place] [address] [phone]   [place] [address] [place] [phone]   Planned Parenthood    [phone]   [facility]    [phone]   [place] and [place], [place] Phone:  [phone], Fax:  [phone] Hours of Operation:  9 am - 6 pm, M-F.  Also accepts Medicaid/Medicare and self-pay.  [facility] for [place], [address], [place] Phone: [phone], Fax: [phone]. Hours of Operation:  8:30 am - 5:30 pm, M-F.  Also accepts Medicaid and self-pay.  [facility] [place] [address] [place] Phone: [phone]   [place], [place], [place] [phone], Ext. [phone] Mondays & Thursdays: 7-9 AM.  First 15 patients are seen on a first come, first serve basis.    [place] Providers:  Organization         Address  Phone   Notes  [facility] [address],  [phone] Also accepts self-pay patients.  [facility] [address], [place]  [phone]   [place], [address], [place] [phone]   [facility] [address] [place] [phone]   Lucianne Lei [address], [place]   [phone] Only accepts [place] Access [place] patients after they have their name applied to their card.   Self-Pay (no insurance) in [facility]:  Organization         Address  Phone   Notes  Sickle Cell Patients, [facility] [place] [phone]   [facility] [place] [phone]   [facility] [place]  [place], [place], [place] [phone]   [facility]/Dr. Osei-Bonsu  [address] [place] or [address], [place] [phone] Phone number for both [place] and [place] locations is the same.  [facility] [address]  [place] [phone]   [facility] [address] [place] or [address] [phone]   [facility] [address] [place] [phone], phone; [phone], fax Sees patients 1st and 3rd Saturday of every month.  Must not qualify for public or private insurance (i.e. Medicaid, Medicare, [place] Health Choice, Veterans' Benefits)  Household income should be no more than 200% of the poverty level The clinic cannot treat you if you are pregnant or think you are pregnant  Sexually transmitted diseases are not treated at the clinic.    Dental Care: Organization  Address  Phone  Notes  [facility] [place], [place] [phone] Accepts children up to age 26 who are enrolled in [place] or [place]; pregnant women with a Medicaid card; and children who have applied for Medicaid or [place] Health Choice, but were declined, whose parents can pay a reduced fee at time of service.  [facility] Department of [facility]  [address], [place] [phone] Accepts children up to age 64 who are enrolled in [place] or [place]; pregnant women with a Medicaid card; and children who have applied for Medicaid or [place] Health Choice, but were declined, whose parents can pay a reduced fee at time of service.  [facility] Adult Dental Access PROGRAM  [place] [phone] Patients are seen by appointment only. Walk-ins are not accepted. [place] will see patients 78 years of age and older. Monday - Tuesday (8am-5pm) Most Wednesdays (8:30-5pm) $30 per visit, cash only  [facility] Adult Dental Access PROGRAM  [address], [facility] [phone] Patients are seen by appointment only. Walk-ins are not accepted. [place] will see patients 7 years of age and older. One Wednesday Evening  (Monthly: Volunteer Based).  $30 per visit, cash only  [place]  [phone] for adults; Children under age 75, call Graduate Pediatric Dentistry at [phone]. Children aged 61-14, please call [phone] to request a pediatric application.  Dental services are provided in all areas of dental care including fillings, crowns and bridges, complete and partial dentures, implants, gum treatment, root canals, and extractions. Preventive care is also provided. Treatment is provided to both adults and children. Patients are selected via a lottery and there is often a waiting list.   [facility] [address] [place]  [phone] [email or web address]   [facility] [address] [place], [place] [phone], Ext. [phone] Second and Fourth Thursday of each month, opens at 6:30 AM; Clinic ends at 9 AM.  Patients are seen on a first-come first-served basis, and a limited number are seen during each clinic.   [facility]  [address] Hillard Danker [place], [place] [phone]   Eligibility Requirements You must have lived in [place], [place], or [place] counties for at least the last three months.   You cannot be eligible for state or federal sponsored [employer], including [employer], [place], or [employer].   You generally cannot be eligible for healthcare insurance through your employer.    How to apply: Eligibility screenings are held every Tuesday and Wednesday afternoon from 1:00 pm until 4:00 pm. You do not need an appointment for the interview!  [facility] [address], [place], [place]   [place]  [place] Department  [place]  [phone]    Behavioral Health Resources in the Community: Intensive Outpatient Programs Organization         Address  Phone  Notes  [place]. [address] [place], [place] [phone]   [facility] [address] [place], [place]   ADS: [facility] [address] [place], [place]   [place] [address],  [place], [place] or [phone]   Substance Abuse Resources  Organization         Address  Phone  Notes  Alcohol and Drug Services  [place]  [phone]   [facility] [place]  [phone]   Chinita Pester  [phone]   Residential & Outpatient Substance Abuse Program  [phone]   Psychological Services Organization         Address  Phone  Notes  [facility] [place]  [place]  [phone]   [place] [address], [place] or [phone]    Mobile Crisis Teams Organization         Address  Phone  Notes  [facility], Mobile Crisis Care Unit  [phone]   [facility]ive Psychotherapeutic Services  [address] [place], [place]   [place] Levels [address] [place] [phone]    Self-Help/Support Groups Organization         Address  Phone             Notes  [place]. of [place] - variety of support groups  [place] Call for more information  Narcotics Anonymous (NA), Caring Services [address], [employer] [place]  2 meetings at this location   Special educational needs teacher         Address  Phone  Notes  ASAP Residential Treatment [place],    [place]  [phone]   [facility]  [address] [place] [phone], [place], [place]   [place], [place] [phone] Admissions: 8am-3pm M-F  Incentives Substance [place] [address],    [place], [place] [phone]   [facility] [address] [place],  [place], [place]   [facility] [address]  [place], [place]   [facility] Programs - Intensive Outpatient [address], [place], [place]   [facility] ([place].,  [place], [place] [phone] or [phone]   [facility] Services (RTS) [address], [place], [place] Accepts Medicaid  [facility] [place] [address]  [place] [phone] Substance Abuse/Addiction Treatment   [facility] Organization         Address  Phone  Notes  [facility]  [phone]   Domenic Schwab, PhD [address] Arlis Porta [place], [place]   [phone] or [phone]   [facility] [place], [place] [phone]   [facility] [address], [place], [place] [phone] Insurance/Medicaid/sponsorship through [facility] and Families [address] [place]                                    [place], [place] [phone] [place] [address], [place] [phone]    Dr. Adele Schilder  [phone]   [facility] Dept. 1) [address] [place] 2) [place] 3)  [address], [place] [phone]  [phone]   [place] [phone] or [phone] (After Hours)

## 2015-03-31 NOTE — ED Provider Notes (Signed)
CSN: [id number]     Arrival date & time 03/31/15  0209 History   First MD Initiated Contact with Patient 03/31/15 [phone]     Chief Complaint  Patient presents with  . Follow-up    (Consider location/radiation/quality/duration/timing/severity/associated sxs/prior Treatment) HPI Comments: Patient is a 44 year old female with a history of palpitations, psychosis, bipolar disorder, substance abuse and delusions, and homelessness. She presents to the emergency department today for follow-up. This is the patient's fourth visit to the emergency department in the last week. She reports that the [facility] is not currently accepting new patients and she needs to find out what is going on with her. Patient was evaluated approximately 20 hours ago with a reassuring workup. She states that she has had fatigue and nausea since her discharge. She reports an irregular sleep pattern. Patient also complaining of dizziness. Her ED evaluation 6 days ago, patient has had complaints of weakness and dizziness for the past 2 years. She has had some mild shortness of breath which she was evaluated for at her prior visit. No worsening of the symptoms since discharge. She denies any chest pain or fever.  The history is provided by the patient. No language interpreter was used.    Past Medical History  Diagnosis Date  . Palpitations   . Psychosis   . BIPOLAR DISORDER   . Substance abuse   . Delusions   . Homelessness   . Left ventricular hypertrophy   . Sickle cell trait   . Homelessness    Past Surgical History  Procedure Laterality Date  . Bunionectomy     Family History  Problem Relation Age of Onset  . Heart disease Mother     CHF   History  Substance Use Topics  . Smoking status: Current Every Day Smoker -- 1.00 packs/day for 10 years    Types: Cigarettes  . Smokeless tobacco: Not on file  . Alcohol Use: No   OB History    Gravida Para Term Preterm AB TAB SAB Ectopic Multiple Living   1                Review of Systems  Constitutional: Positive for fatigue. Negative for fever.  Cardiovascular: Negative for chest pain.  Gastrointestinal: Positive for nausea. Negative for vomiting.  Neurological: Positive for dizziness. Negative for syncope.  All other systems reviewed and are negative.   Allergies  Strawberry and Risperidone and related  Home Medications   Prior to Admission medications   Medication Sig Start Date End Date Taking? Authorizing Provider  ibuprofen (ADVIL,MOTRIN) 800 MG tablet Take 1 tablet (800 mg total) by mouth every 8 (eight) hours as needed for mild pain. 02/01/15   Kristen N Ward, DO  nitroGLYCERIN (NITROSTAT) 0.4 MG SL tablet Place 0.4 mg under the tongue every 5 (five) minutes as needed for chest pain.    Historical Provider, MD  potassium chloride SA (K-DUR,KLOR-CON) 20 MEQ tablet Take 1 tablet (20 mEq total) by mouth 2 (two) times daily. Patient not taking: Reported on 03/29/2015 03/24/15   Rolland Porter, MD   BP 123/84 mmHg  Pulse 55  Temp(Src) 97.6 F (36.4 C) (Oral)  Resp 16  SpO2 98%  LMP 03/16/2015   Physical Exam  Constitutional: She is oriented to person, place, and time. She appears well-developed and well-nourished. No distress.  Nontoxic/nonseptic appearing  HENT:  Head: Normocephalic and atraumatic.  Eyes: Conjunctivae and EOM are normal. No scleral icterus.  Neck: Normal range of motion.  No  nuchal rigidity or meningismus  Cardiovascular: Normal rate, regular rhythm and intact distal pulses.   Murmur heard. Pulmonary/Chest: Effort normal and breath sounds normal. No respiratory distress. She has no wheezes. She has no rales.  Respirations even and unlabored. Lungs clear.  Musculoskeletal: Normal range of motion.  Neurological: She is alert and oriented to person, place, and time. She exhibits normal muscle tone. Coordination normal.  GCS 15. Speech is goal oriented. Patient moves extremities without ataxia. She is angulated with  steady gait.  Skin: Skin is warm and dry. No rash noted. She is not diaphoretic. No erythema. No pallor.  Psychiatric: She has a normal mood and affect. Her behavior is normal.  Nursing note and vitals reviewed.   ED Course  Procedures (including critical care time) Labs Review  Results for orders placed or performed during the hospital encounter of 03/29/15  CBC with Differential/Platelet  Result Value Ref Range   WBC 4.2 4.0 - 10.5 K/uL   RBC 4.06 3.87 - 5.11 MIL/uL   Hemoglobin 11.7 (L) 12.0 - 15.0 g/dL   HCT 33.7 (L) 36.0 - 46.0 %   MCV 83.0 78.0 - 100.0 fL   MCH 28.8 26.0 - 34.0 pg   MCHC 34.7 30.0 - 36.0 g/dL   RDW 13.7 11.5 - 15.5 %   Platelets 183 150 - 400 K/uL   Neutrophils Relative % 48 43 - 77 %   Neutro Abs 2.0 1.7 - 7.7 K/uL   Lymphocytes Relative 39 12 - 46 %   Lymphs Abs 1.6 0.7 - 4.0 K/uL   Monocytes Relative 9 3 - 12 %   Monocytes Absolute 0.4 0.1 - 1.0 K/uL   Eosinophils Relative 3 0 - 5 %   Eosinophils Absolute 0.1 0.0 - 0.7 K/uL   Basophils Relative 1 0 - 1 %   Basophils Absolute 0.0 0.0 - 0.1 K/uL  Basic metabolic panel  Result Value Ref Range   Sodium 139 135 - 145 mmol/L   Potassium 3.4 (L) 3.5 - 5.1 mmol/L   Chloride 105 101 - 111 mmol/L   CO2 24 22 - 32 mmol/L   Glucose, Bld 94 65 - 99 mg/dL   BUN 7 6 - 20 mg/dL   Creatinine, Ser 0.83 0.44 - 1.00 mg/dL   Calcium 9.0 8.9 - 10.3 mg/dL   GFR calc non Af Amer >60 >60 mL/min   GFR calc Af Amer >60 >60 mL/min   Anion gap 10 5 - 15  I-stat troponin, ED  Result Value Ref Range   Troponin i, poc 0.01 0.00 - 0.08 ng/mL   Comment 3           Imaging Review Dg Chest 2 View  03/29/2015   CLINICAL DATA:  Left-sided chest pain with palpitations.  EXAM: CHEST  2 VIEW  COMPARISON:  02/07/2015  FINDINGS: Lungs are adequately inflated without focal consolidation or effusion. Mild prominence of the cardiac silhouette which is likely due to mild true cardiomegaly although cannot exclude a mild degree of pectus  excavatum deformity. Remainder of the exam is unchanged.  IMPRESSION: No acute cardiopulmonary disease.  Stable mild cardiomegaly.   Electronically Signed   By: Marin Olp M.D.   On: 03/29/2015 08:25     EKG Interpretation None      MDM   Final diagnoses:  Other fatigue    44 year old female presents to the emergency department 20 hours following discharge. She is concerned because she is unable to follow-up with the  [facility] as an outpatient because they are not accepting new patients at this time. Patient continues to complain of fatigue, dizziness, nausea, and shortness of breath. No fever or chest pain today. No syncope. Patient is very poor historian. Her prior workups were reviewed and found to be reassuring. EKG today is nonischemic. Patient is hemodynamically stable compared to prior 3 visits. Upon review of patient's chart, her complaints appear to be chronic for her. I believe there is an underlying acute psychiatric component; however patient does not appear to be a threat to herself or others. No indication for further emergent workup at this time. Will refer patient to Dr. Liston Alba should she not be able to follow-[facility] with the [facility]. Patient advised to get regular sleep and to eat regular meals. Resource guide provided at discharge.   Filed Vitals:   03/31/15 0217 03/31/15 0345 03/31/15 0400 03/31/15 0406  BP: 123/84 116/69 125/83 125/83  Pulse: 55 46 47 52  Temp: 97.6 F (36.4 C)   98.8 F (37.1 C)  TempSrc: Oral     Resp: 16 14 15 15   SpO2: 98% 100% 99% 100%     Antonietta Breach, PA-C 03/31/15 9357  Julianne Rice, MD 03/31/15 [phone]

## 2015-03-31 NOTE — ED Notes (Signed)
Pt. is here for a follow up from her previous visit , denies any pain or discomfort , respirations unlabored .

## 2015-04-14 ENCOUNTER — Encounter (HOSPITAL_COMMUNITY): Payer: Self-pay | Admitting: Emergency Medicine

## 2015-04-14 ENCOUNTER — Emergency Department (HOSPITAL_COMMUNITY)
Admission: EM | Admit: 2015-04-14 | Discharge: 2015-04-14 | Disposition: A | Discharge status: Home / Self Care | Payer: Medicaid Other | Attending: Emergency Medicine | Admitting: Emergency Medicine

## 2015-04-14 ENCOUNTER — Emergency Department (HOSPITAL_COMMUNITY): Payer: Medicaid Other

## 2015-04-14 DIAGNOSIS — Z8679 Personal history of other diseases of the circulatory system: Secondary | ICD-10-CM | POA: Insufficient documentation

## 2015-04-14 DIAGNOSIS — Z720 Tobacco use: Secondary | ICD-10-CM | POA: Diagnosis not present

## 2015-04-14 DIAGNOSIS — Z3202 Encounter for pregnancy test, result negative: Secondary | ICD-10-CM | POA: Insufficient documentation

## 2015-04-14 DIAGNOSIS — M79673 Pain in unspecified foot: Secondary | ICD-10-CM | POA: Diagnosis not present

## 2015-04-14 DIAGNOSIS — Z590 Homelessness: Secondary | ICD-10-CM | POA: Insufficient documentation

## 2015-04-14 DIAGNOSIS — Z862 Personal history of diseases of the blood and blood-forming organs and certain disorders involving the immune mechanism: Secondary | ICD-10-CM | POA: Diagnosis not present

## 2015-04-14 DIAGNOSIS — Z8659 Personal history of other mental and behavioral disorders: Secondary | ICD-10-CM | POA: Insufficient documentation

## 2015-04-14 LAB — I-STAT CHEM 8, ED
BUN: 12 mg/dL (ref 6–20)
CALCIUM ION: 1.17 mmol/L (ref 1.12–1.23)
CHLORIDE: 106 mmol/L (ref 101–111)
Creatinine, Ser: 0.9 mg/dL (ref 0.44–1.00)
GLUCOSE: 91 mg/dL (ref 65–99)
HCT: 37 % (ref 36.0–46.0)
Hemoglobin: 12.6 g/dL (ref 12.0–15.0)
Potassium: 3.4 mmol/L — ABNORMAL LOW (ref 3.5–5.1)
Sodium: 140 mmol/L (ref 135–145)
TCO2: 23 mmol/L (ref 0–100)

## 2015-04-14 LAB — I-STAT TROPONIN, ED: Troponin i, poc: 0 ng/mL (ref 0.00–0.08)

## 2015-04-14 LAB — I-STAT BETA HCG BLOOD, ED (MC, WL, AP ONLY): I-stat hCG, quantitative: <5 m[IU]/mL (ref <5)

## 2015-04-14 MED ORDER — IBUPROFEN 200 MG PO TABS
600.0000 mg | ORAL_TABLET | Freq: Once | ORAL | Status: AC
  Administered 2015-04-14: 600 mg via ORAL
  Filled 2015-04-14: qty 3

## 2015-04-14 NOTE — ED Notes (Signed)
Patient transported to X-ray 

## 2015-04-14 NOTE — Discharge Instructions (Signed)
Musculoskeletal Pain Musculoskeletal pain is muscle and boney aches and pains. These pains can occur in any part of the body. Your caregiver may treat you without knowing the cause of the pain. They may treat you if blood or urine tests, X-rays, and other tests were normal.  CAUSES There is often not a definite cause or reason for these pains. These pains may be caused by a type of germ (virus). The discomfort may also come from overuse. Overuse includes working out too hard when your body is not fit. Boney aches also come from weather changes. Bone is sensitive to atmospheric pressure changes. HOME CARE INSTRUCTIONS   Ask when your test results will be ready. Make sure you get your test results.  Only take over-the-counter or prescription medicines for pain, discomfort, or fever as directed by your caregiver. If you were given medications for your condition, do not drive, operate machinery or power tools, or sign legal documents for 24 hours. Do not drink alcohol. Do not take sleeping pills or other medications that may interfere with treatment.  Continue all activities unless the activities cause more pain. When the pain lessens, slowly resume normal activities. Gradually increase the intensity and duration of the activities or exercise.  During periods of severe pain, bed rest may be helpful. Lay or sit in any position that is comfortable.  Putting ice on the injured area.  Put ice in a bag.  Place a towel between your skin and the bag.  Leave the ice on for 15 to 20 minutes, 3 to 4 times a day.  Follow up with your caregiver for continued problems and no reason can be found for the pain. If the pain becomes worse or does not go away, it may be necessary to repeat tests or do additional testing. Your caregiver may need to look further for a possible cause. SEEK IMMEDIATE MEDICAL CARE IF:  You have pain that is getting worse and is not relieved by medications.  You develop chest pain  that is associated with shortness or breath, sweating, feeling sick to your stomach (nauseous), or throw up (vomit).  Your pain becomes localized to the abdomen.  You develop any new symptoms that seem different or that concern you. MAKE SURE YOU:   Understand these instructions.  Will watch your condition.  Will get help right away if you are not doing well or get worse. Document Released: 10/18/2005 Document Revised: 01/10/2012 Document Reviewed: 06/22/2013 [facility] Patient Information 2015 [place], [place]. This information is not intended to replace advice given to you by your health care provider. Make sure you discuss any questions you have with your health care provider.   RESOURCE GUIDE  Chronic Pain Problems: Contact [facility]  [phone] Patients need to be referred by their primary care doctor.  Insufficient Money for Medicine: Contact United Way:  call "211" or [place] [phone].  No Primary Care Doctor: Call Health Connect  [phone] - can help you locate a primary care doctor that  accepts your insurance, provides certain services, etc. Physician Referral [phone]  Agencies that provide inexpensive medical care: [facility]  [facility]  [phone] [facility]  [phone] [facility]  [phone] Planned Parenthood  [facility]  [phone]  [place] Providers: [facility]- [address]  [phone], Mon-Fri 9am-7pm, Sat 9am-1pm [place], Suite  [address], [address] [place]  [place]- [address]  [place], [address], [phone]  Only accepts [place] patients after they have their name  applied to their  card  Self Pay (no insurance) in [facility]: Sickle Cell Patients: Dr Kevan Ny, [facility]  [place], [facility]- [place]  [facility]- [address] [place], [facility]- see information above (Speak to [employer] if you do not have insurance)       -  [facility]- [place], [place],  [place], [place]  Dr Vista Lawman-  [address], [place], [facility]- [address] [phone]       -  [facility]- [address] [place], [place], also [address], [phone]       -    [facility]- [address], [place], 1st & 3rd Saturday   every month, 10am-1pm  1) Find a Doctor and Pay Out of Pocket Although you won't have to find out who is covered by your insurance plan, it is a good idea to ask around and get recommendations. You will then need to call the office and see if the doctor you have chosen will accept you as a new patient and what types of options they offer for patients who are self-pay. Some doctors offer discounts or will set up payment plans for their patients who do not have insurance, but you will need to ask so you aren't surprised when you get to your appointment.  2) Contact Your Local Health Department Not all health departments have doctors that can see patients for sick visits, but many do, so it is worth a call to see if yours does. If you don't know where your local health department is, you can check in your phone book. The CDC also has a tool to help you locate your state's health department, and many state websites also have listings of all of their local health departments.  3) Find a [place] [facility] If your illness is not likely  to be very severe or complicated, you may want to try a walk in clinic. These are popping up all over the country in pharmacies, drugstores, and shopping centers. They're usually staffed by nurse practitioners or physician assistants that have been trained to treat common illnesses and complaints. They're usually fairly quick and inexpensive. However, if you have serious medical issues or chronic medical problems, these are probably not your best option  [facility] [place], [facility], [address] [place], phone [phone] or [phone].  Monday - Friday, call for an appointment. [place], [facility], [address], [place], phone [phone] or [phone].  Monday - Friday, call for an appointment.  Abuse/Neglect: [place] (  [phone] [place] [phone] (After Hours)  Emergency Shelter:  [facility] [phone]  Maternity Homes: Room at the [place] [phone] [place] [phone]  MRSA Hotline #:   (682)764-3157  [facility] Dept. [address]         [place] Phone:  [phone]                                  Phone:  [phone]                   Phone:  [phone]  [facility], [place]- [phone]       -     [facility] in [place], [address]                                  [place] [phone] or [phone] (After Hours)   [place]  Substance Abuse Resources: Alcohol and Drug Services  [phone] [place] [phone] The [place]a Pester 5406224715 Residential & Outpatient Substance Abuse Program  [phone]  Psychological Services: [place]  [phone] [place]  [place], [place]. [address], [place], [place]: [phone] or [phone], [email or web address]  Dental Assistance  If unable to pay or uninsured, contact:  [facility] or [facility]. to become qualified for the adult dental clinic.  Patients with Medicaid: [facility] [phone] [address], [place] [address], [phone]  If unable to pay, or uninsured, contact [facility] [phone]) or [place] [phone] in [place], [place] in [facility]) to become qualified for the adult dental clinic  Other [place]- [place], [place], [place], [address], [place], [place], 2nd and 4th Thursday of the month at 6:30am.  10 clients each day by appointment, can sometimes see walk-in patients if someone does not show for an appointment. [facility]- [address] [place], [place], [address], [place], [place], [place], [address], [place] [facility] [phone]  Please make every effort to establish with a primary care physician for routine medical care  [place]  The [place] provides a wide range of adult health services. Some of these services are designed to address the healthcare needs of all [facility] residents and all services are designed to  meet the needs of  uninsured/underinsured low income residents. Some services are available to any resident of [place], call [phone] for details. ] The [facility], a new medical clinic for adults, is now open. For more information about the Center and its services please call [phone]. For information on our [place] services, click here.  For more information on any of the following Department of Public Health programs, including hours of service, click on the highlighted link.  SERVICES FOR WOMEN (Adults and Teens) [employer] provide a full range of birth control options plus education and counseling. New patient visit and annual return visits include a complete examination, pap test as indicated, and other laboratory as indicated. Included is our [employer] for men.  Maternity Care is provided through pregnancy, including a six week post partum exam. Women who meet eligibility criteria for the Medicaid for Pregnant Women program, receive care free. Other women are charged on a sliding scale according to income. Note: [facility] provides services to pregnant women who have a Medicaid card. Call [phone] for an appointment in [place] or [phone] for an appointment in [facility].  Primary Care for Medicaid [place] Access Women is available through the [place]. As primary care provider for the [place] program, women may designate the [facility] clinic as their primary care provider.  PLEASE CALL [phone] FOR AN APPOINTMENT FOR THE ABOVE SERVICES IN EITHER [place] OR [place]. Information available in [place] and [place].   Childbirth Education Classes are open to the public and offered to help families prepare for the best possible childbirth experience as well as to promote lifelong health and wellness. Classes are offered throughout the  year and meet on the same night once a week for five weeks. Medicaid covers the cost of the classes for the mother-to-be and her partner. For participants without Medicaid, the cost of the class series is $45.00 for the mother-to-be and her partner. Class size is limited and registration is required. For more information or to register call [phone]. Baby items donated by [employer] and the [employer] [place] are given away during each class series.  SERVICES FOR WOMEN AND MEN Sexually Transmitted Infection appointments, including HIV testing, are available daily (weekdays, except holidays). Call early as same-day appointments are limited. For an appointment in either [facility] or [place], call [phone]. Services are confidential and free of charge.  Skin Testing for Tuberculosis Please call [phone]. Adult Immunizations are available, usually for a fee. Please call [phone] for details.  PLEASE CALL [phone] FOR AN APPOINTMENT FOR THE ABOVE SERVICES IN EITHER [place] OR [place].   [facility] provides up to the minute recommended vaccines for your travel destination. We also provide essential health and political information to help insure a safe and pleasurable travel experience. This program is self-sustaining, however, fees are very competitive. We are a CERTIFIED YELLOW FEVER IMMUNIZATION approved clinic site. PLEASE CALL [phone] FOR AN APPOINTMENT IN EITHER  OR [place].   If you have questions about the services listed above, we want to answer them! Email [place] at: [email or web address] Home Visiting Services for elderly and the disabled are available to residents of [facility] who are in need of care that compares to the care offered by a nursing home, have needs that can be met by the program, and have [place] Medicaid. Other short term services are available  to residents 18 years and older who are unable to meet requirements for  eligibility to receive services from a certified home health agency, spend the majority of time at home, and need care for six months or less.  PLEASE CALL [phone] OR [phone] FOR MORE INFORMATION. Medication Assistance Program serves as a link between pharmaceutical companies and patients to provide low cost or free prescription medications. This servce is available for residents who meet certain income restrictions and have no insurance coverage.  PLEASE CALL [phone] ([place]) OR [phone] ([place]) FOR MORE INFORMATION.  Updated Feb. 21, 2013

## 2015-04-14 NOTE — ED Notes (Signed)
Pt presents with EMS for foot pain; EMS reports picking patient up from cookout and pt stated that "she was waiting for her ride but her ride never showed up and she started walking home and began having foot pain"; right foot swollen and pt c/o pain; pt states she lives "just down the road from here"; pt arrived to ER and reports CP; pt denies sob, n/v, diaphoresis; pt able to speak in full sentences

## 2015-04-14 NOTE — ED Notes (Addendum)
Pt returned from xray without having studies done; xray tech reports pt requesting urine preg test first; RN requested urine sample from pt; pt states "I dont have to go right now"; RN explained to patient that prolonging testing will prolong stay; patient verbalized understanding

## 2015-04-14 NOTE — ED Provider Notes (Signed)
CSN: [id number]     Arrival date & time 04/14/15  0236 History   First MD Initiated Contact with Patient 04/14/15 [phone]     Chief Complaint  Patient presents with  . Foot Pain     (Consider location/radiation/quality/duration/timing/severity/associated sxs/prior Treatment) HPI    PCP: Pcp Not In System Blood pressure 109/67, pulse 63, temperature 98 F (36.7 C), temperature source Oral, resp. rate 17, height 5\' 5"  (1.651 m), weight 140 lb (63.504 kg), last menstrual period 03/03/2015, SpO2 97 %.  Anita Hill is a 44 y.o.female with a significant PMH of palpitations, psychosis, bipolar disorder, substance abuse,LVH, homelessness, sickle cell trait  presents to the ER with complaints of foot pain. EMS picked her up down the road from here. The patient says that she was walking [facility] and her foot started to hurt. It is no longer hurting. She denies any wound or injury.   The patient denies having any symptoms of chest pains, SOB, weakness, abdominal pain, dysuria.   Pt at the end of HPI tells me that she only came because she wants to speak with social work.   Past Medical History  Diagnosis Date  . Palpitations   . Psychosis   . BIPOLAR DISORDER   . Substance abuse   . Delusions   . Homelessness   . Left ventricular hypertrophy   . Sickle cell trait   . Homelessness    Past Surgical History  Procedure Laterality Date  . Bunionectomy     Family History  Problem Relation Age of Onset  . Heart disease Mother     CHF   History  Substance Use Topics  . Smoking status: Current Every Day Smoker -- 1.00 packs/day for 10 years    Types: Cigarettes  . Smokeless tobacco: Not on file  . Alcohol Use: No   OB History    Gravida Para Term Preterm AB TAB SAB Ectopic Multiple Living   1              Review of Systems  10 Systems reviewed and are negative for acute change except as noted in the HPI.     Allergies  Strawberry and Risperidone and related  Home  Medications   Prior to Admission medications   Medication Sig Start Date End Date Taking? Authorizing Provider  ibuprofen (ADVIL,MOTRIN) 800 MG tablet Take 1 tablet (800 mg total) by mouth every 8 (eight) hours as needed for mild pain. 02/01/15   Kristen N Ward, DO  nitroGLYCERIN (NITROSTAT) 0.4 MG SL tablet Place 0.4 mg under the tongue every 5 (five) minutes as needed for chest pain.    Historical Provider, MD  potassium chloride SA (K-DUR,KLOR-CON) 20 MEQ tablet Take 1 tablet (20 mEq total) by mouth 2 (two) times daily. Patient not taking: Reported on 03/29/2015 03/24/15   Rolland Porter, MD   BP 109/67 mmHg  Pulse 63  Temp(Src) 98 F (36.7 C) (Oral)  Resp 17  Ht 5\' 5"  (1.651 m)  Wt 140 lb (63.504 kg)  BMI 23.30 kg/m2  SpO2 97%  LMP 03/03/2015 Physical Exam  Constitutional: She appears well-developed and well-nourished. No distress.  HENT:  Head: Normocephalic and atraumatic.  Eyes: Pupils are equal, round, and reactive to light.  Neck: Normal range of motion. Neck supple.  Cardiovascular: Normal rate and regular rhythm.   Pulmonary/Chest: Effort normal.  Abdominal: Soft.  Musculoskeletal:       Right foot: Normal.       Left  foot: Normal.  No abnormalities noted to feet bilaterally. Physiologic strength, symmetrical pulses. No wounds noted.  Neurological: She is alert.  Skin: Skin is warm and dry.  Nursing note and vitals reviewed.   ED Course  Procedures (including critical care time) Labs Review Labs Reviewed  I-STAT CHEM 8, ED - Abnormal; Notable for the following:    Potassium 3.4 (*)    All other components within normal limits  I-STAT TROPOININ, ED  I-STAT BETA HCG BLOOD, ED ([facility], [facility], AP ONLY)    Imaging Review Dg Foot Complete Left  04/14/2015   CLINICAL DATA:  Feet swelling after picnic.  No injury.  EXAM: RIGHT FOOT COMPLETE - 3+ VIEW; LEFT FOOT - COMPLETE 3+ VIEW  COMPARISON:  None.  FINDINGS: No acute fracture deformity. No dislocation. Mild bilateral hallux  valgus, LEFT greater than RIGHT. 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 No destructive bony lesions. Soft tissue planes are nonsuspicious.  IMPRESSION: No acute osseous process.  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   Electronically Signed   By: Elon Alas M.D.   On: 04/14/2015 05:17   Dg Foot Complete Right  04/14/2015   CLINICAL DATA:  Feet swelling after picnic.  No injury.  EXAM: RIGHT FOOT COMPLETE - 3+ VIEW; LEFT FOOT - COMPLETE 3+ VIEW  COMPARISON:  None.  FINDINGS: No acute fracture deformity. No dislocation. Mild bilateral hallux valgus, LEFT greater than RIGHT. 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 No destructive bony lesions. Soft tissue planes are nonsuspicious.  IMPRESSION: No acute osseous process.  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   Electronically Signed   By: Elon Alas M.D.   On: 04/14/2015 05:17     EKG Interpretation   Date/Time:  Monday April 14 2015 02:45:02 EDT Ventricular Rate:  74 PR Interval:  174 QRS Duration: 80 QT Interval:  389 QTC Calculation: 432 R Axis:   63 Text Interpretation:  Sinus rhythm Artifact When compared with ECG of  10/07/2011 No significant change was found Confirmed by [facility]  MD,  Nunzio Cory [phone]) on 04/14/2015 6:47:06 AM      MDM   Final diagnoses:  Foot pain, unspecified laterality    Patients xrays show osteoarthritis. Will notify Social Worker that patient is requesting to speak with her. Normal vitals, patient appears well and without any acute medical condition.  Medications  ibuprofen (ADVIL,MOTRIN) tablet 600 mg (600 mg Oral Given 04/14/15 0703)    44 y.o.[name] evaluation in the Emergency Department is complete. It has been determined that no acute conditions requiring further emergency intervention are present at this time. The  patient/guardian have been advised of the diagnosis and plan. We have discussed signs and symptoms that warrant return to the ED, such as changes or worsening in symptoms.  Vital signs are stable at discharge. Filed Vitals:   04/14/15 0630  BP: 109/67  Pulse: 63  Temp:   Resp: 17    Patient/guardian has voiced understanding and agreed to follow-up with the PCP or specialist.     Delos Haring, PA-C 04/14/15 [place], DO 04/16/15 1459

## 2015-07-06 IMAGING — CR DG CHEST 2V
2 series · 2 of 2 positions shown · non-contrast
Comparison: 02/07/2015

CLINICAL DATA: Left-sided chest pain with palpitations.

EXAM:
CHEST  2 VIEW

[w chest pa]
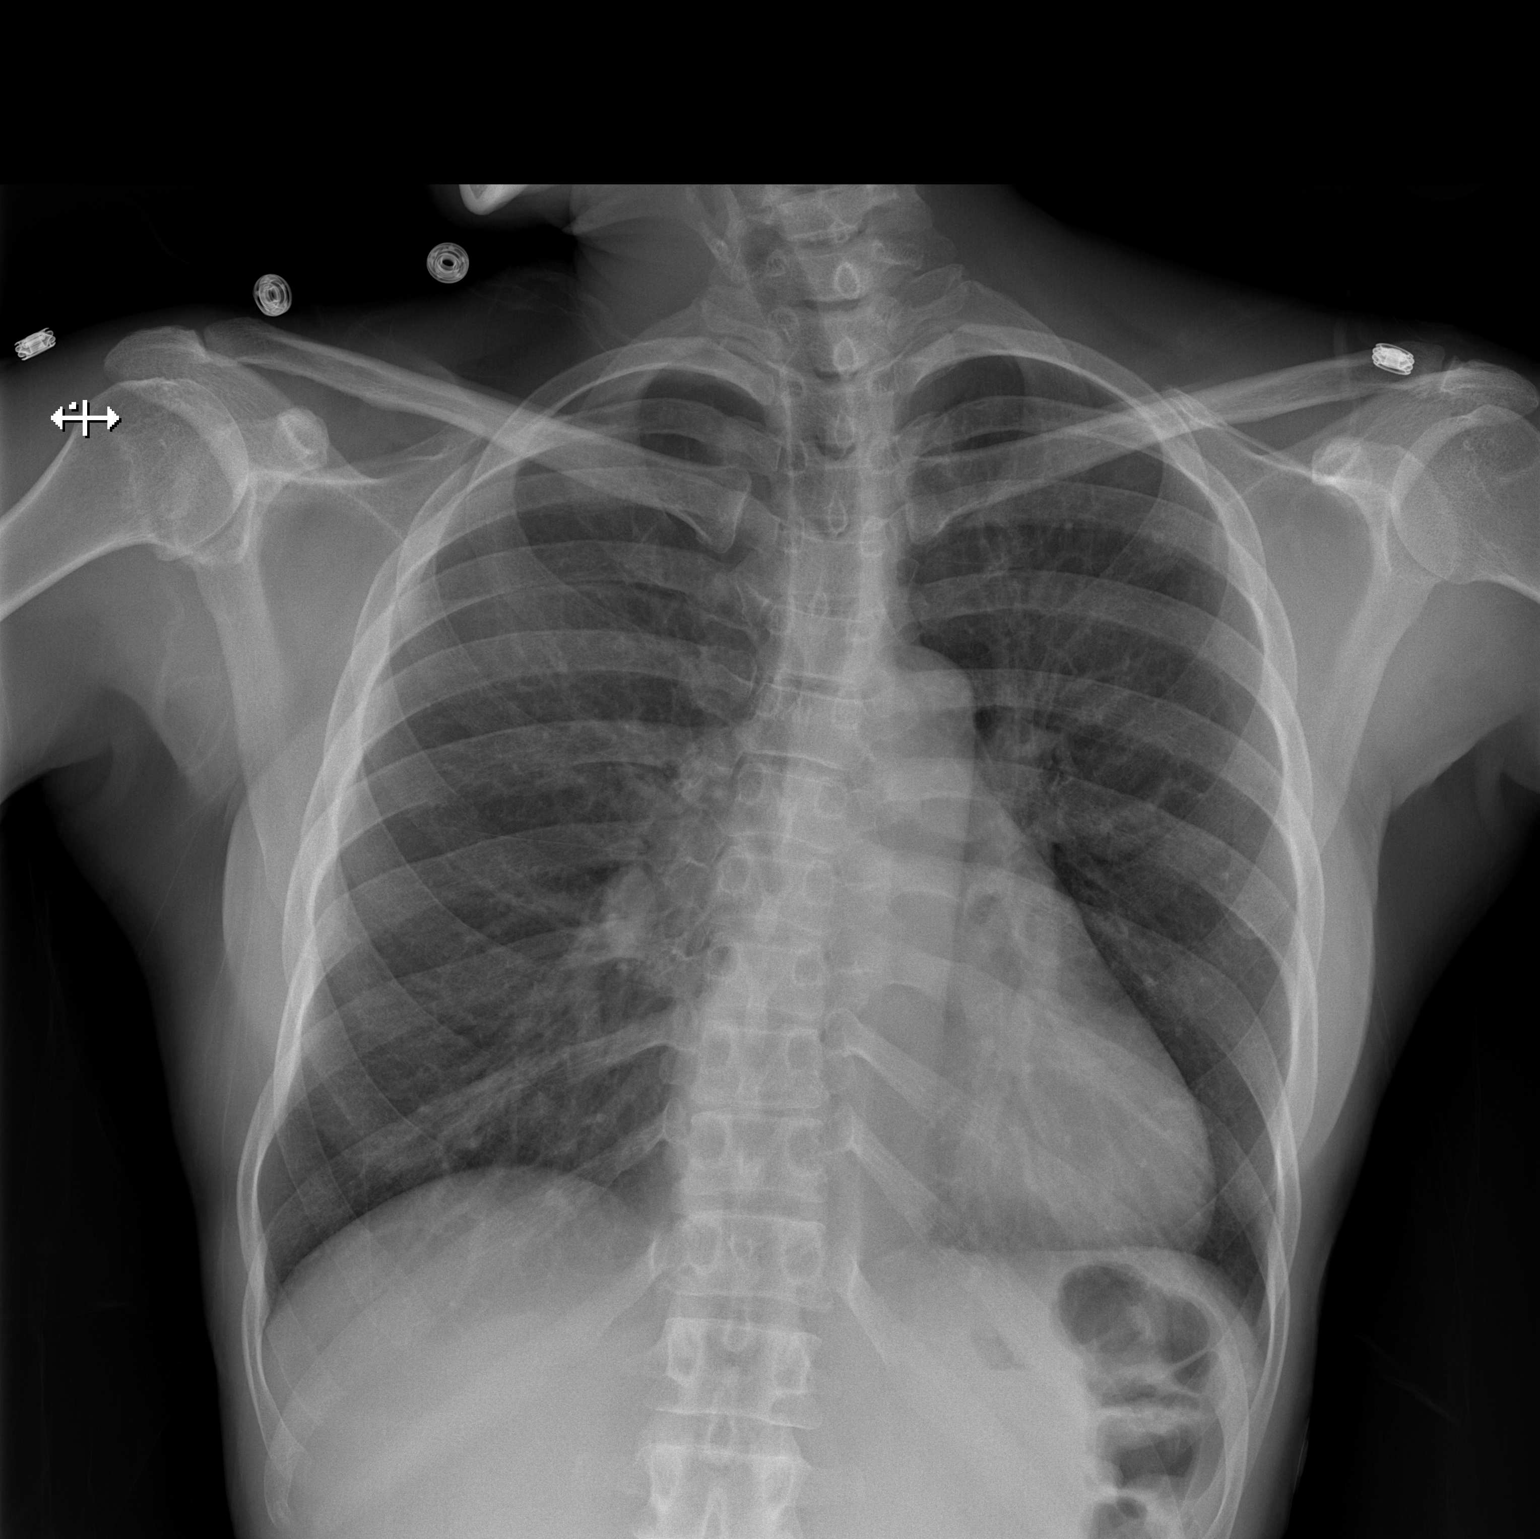

[w chest lat]
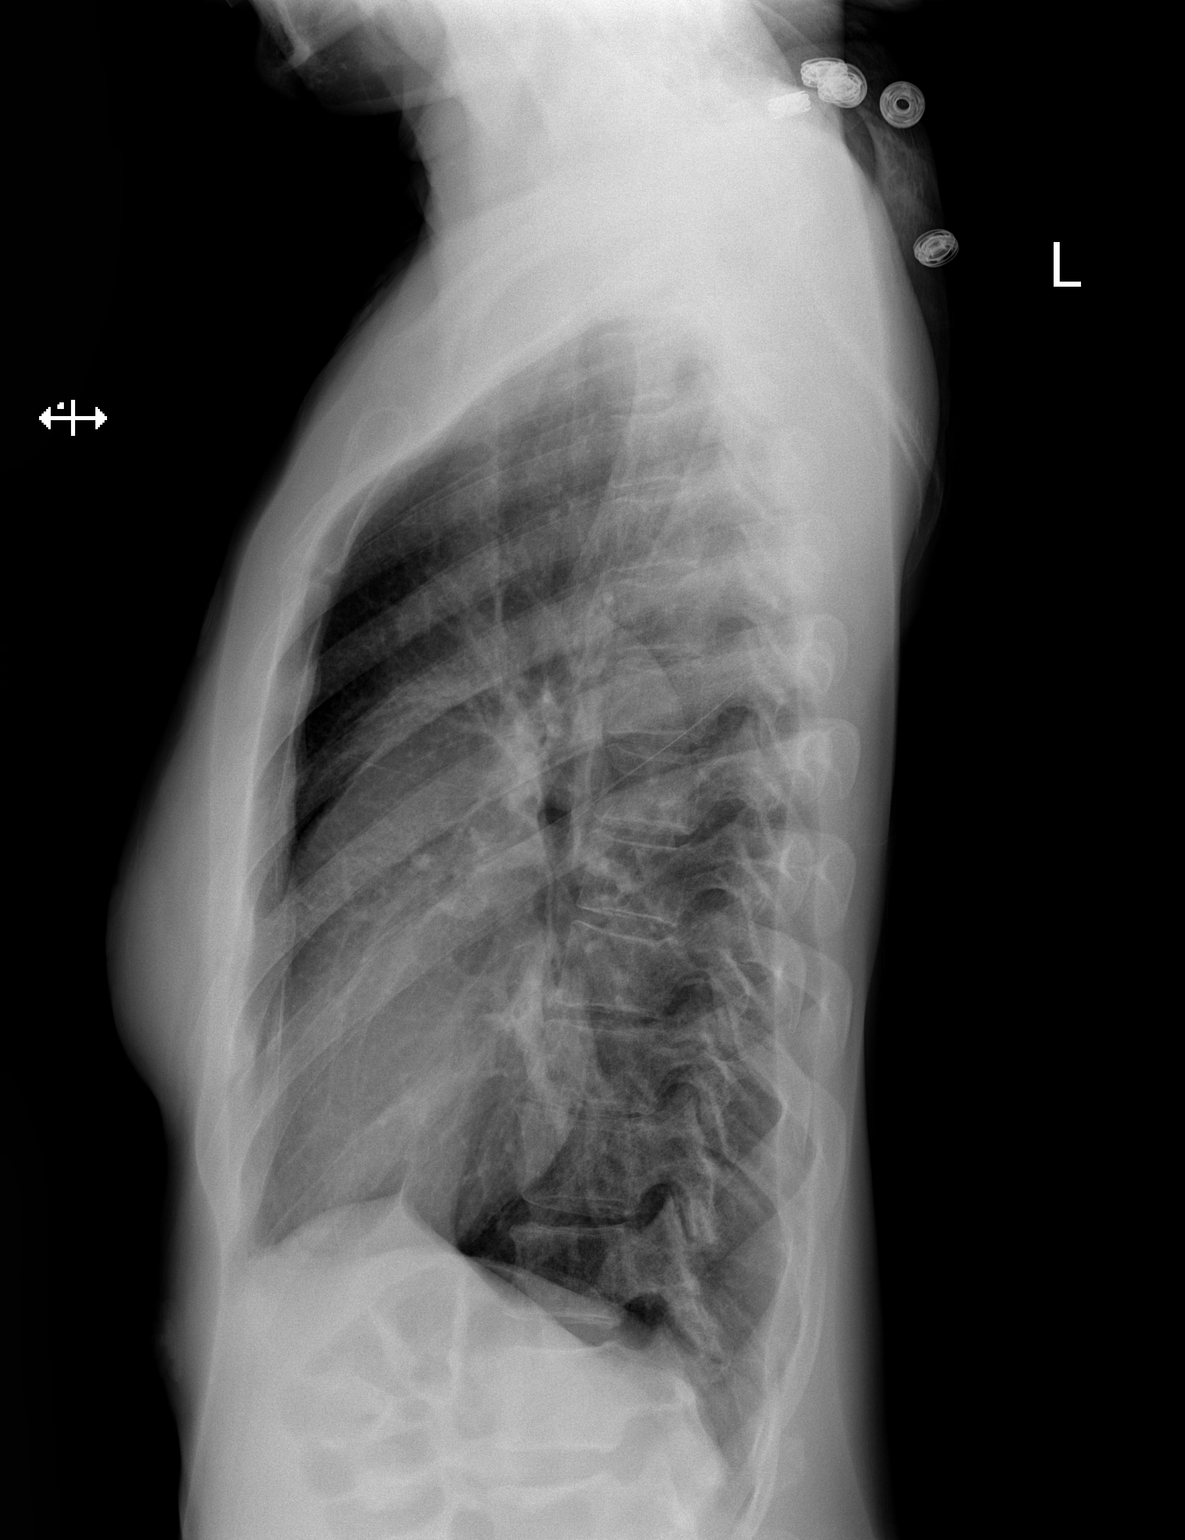

[2 of 2 positions shown; findings below may reference images not displayed]

FINDINGS: Lungs are adequately inflated without focal consolidation or
effusion. Mild prominence of the cardiac silhouette which is likely
due to mild true cardiomegaly although cannot exclude a mild degree
of pectus excavatum deformity. Remainder of the exam is unchanged.
IMPRESSION: No acute cardiopulmonary disease.

Stable mild cardiomegaly.

## 2015-07-23 ENCOUNTER — Encounter (HOSPITAL_COMMUNITY): Payer: Self-pay | Admitting: *Deleted

## 2015-07-23 ENCOUNTER — Emergency Department (HOSPITAL_COMMUNITY)
Admission: EM | Admit: 2015-07-23 | Discharge: 2015-07-23 | Discharge status: Against Medical Advice | Payer: Medicaid Other | Attending: Emergency Medicine | Admitting: Emergency Medicine

## 2015-07-23 DIAGNOSIS — R079 Chest pain, unspecified: Secondary | ICD-10-CM | POA: Diagnosis present

## 2015-07-23 DIAGNOSIS — Z720 Tobacco use: Secondary | ICD-10-CM | POA: Insufficient documentation

## 2015-07-23 LAB — CBC
HCT: 37.6 % (ref 36.0–46.0)
Hemoglobin: 13.3 g/dL (ref 12.0–15.0)
MCH: 30 pg (ref 26.0–34.0)
MCHC: 35.4 g/dL (ref 30.0–36.0)
MCV: 84.7 fL (ref 78.0–100.0)
PLATELETS: 214 10*3/uL (ref 150–400)
RBC: 4.44 MIL/uL (ref 3.87–5.11)
RDW: 13.8 % (ref 11.5–15.5)
WBC: 6.1 10*3/uL (ref 4.0–10.5)

## 2015-07-23 LAB — I-STAT TROPONIN, ED: Troponin i, poc: 0 ng/mL (ref 0.00–0.08)

## 2015-07-23 LAB — BASIC METABOLIC PANEL
Anion gap: 8 (ref 5–15)
BUN: 6 mg/dL (ref 6–20)
CALCIUM: 8.8 mg/dL — AB (ref 8.9–10.3)
CO2: 26 mmol/L (ref 22–32)
CREATININE: 0.79 mg/dL (ref 0.44–1.00)
Chloride: 102 mmol/L (ref 101–111)
GFR calc non Af Amer: >60 mL/min (ref >60)
Glucose, Bld: 81 mg/dL (ref 65–99)
Potassium: 3.7 mmol/L (ref 3.5–5.1)
Sodium: 136 mmol/L (ref 135–145)

## 2015-07-23 LAB — I-STAT BETA HCG BLOOD, ED (MC, WL, AP ONLY)

## 2015-07-23 NOTE — ED Notes (Signed)
Pt reports left chest pain that started yesterday. Pt denies SOB. Pt reports that pain is worse with walking

## 2015-10-13 ENCOUNTER — Encounter (HOSPITAL_COMMUNITY): Payer: Self-pay | Admitting: Emergency Medicine

## 2015-10-13 ENCOUNTER — Emergency Department (HOSPITAL_COMMUNITY)
Admission: EM | Admit: 2015-10-13 | Discharge: 2015-10-13 | Disposition: A | Discharge status: Home / Self Care | Payer: Medicaid Other | Attending: Emergency Medicine | Admitting: Emergency Medicine

## 2015-10-13 DIAGNOSIS — Z8679 Personal history of other diseases of the circulatory system: Secondary | ICD-10-CM | POA: Diagnosis not present

## 2015-10-13 DIAGNOSIS — F17210 Nicotine dependence, cigarettes, uncomplicated: Secondary | ICD-10-CM | POA: Diagnosis not present

## 2015-10-13 DIAGNOSIS — H538 Other visual disturbances: Secondary | ICD-10-CM | POA: Diagnosis not present

## 2015-10-13 DIAGNOSIS — R21 Rash and other nonspecific skin eruption: Secondary | ICD-10-CM | POA: Diagnosis not present

## 2015-10-13 DIAGNOSIS — J069 Acute upper respiratory infection, unspecified: Secondary | ICD-10-CM | POA: Insufficient documentation

## 2015-10-13 DIAGNOSIS — Z590 Homelessness: Secondary | ICD-10-CM | POA: Insufficient documentation

## 2015-10-13 DIAGNOSIS — K0889 Other specified disorders of teeth and supporting structures: Secondary | ICD-10-CM | POA: Diagnosis not present

## 2015-10-13 DIAGNOSIS — Z862 Personal history of diseases of the blood and blood-forming organs and certain disorders involving the immune mechanism: Secondary | ICD-10-CM | POA: Insufficient documentation

## 2015-10-13 DIAGNOSIS — Z8659 Personal history of other mental and behavioral disorders: Secondary | ICD-10-CM | POA: Diagnosis not present

## 2015-10-13 MED ORDER — SALINE SPRAY 0.65 % NA SOLN: 1.0000 | NASAL | Status: AC | PRN

## 2015-10-13 NOTE — ED Notes (Signed)
Pt has multiple complaints-- states unable to "see phone numbers, unable to see for 8-9 days. My teeth hurt too. Also my left foot hurts"  Recently changed meds 2 weeks ago-- stopped Latuda, and "a little yellow and blue pill- antidepressant" and started on Haldol and cogentin. Blurry vision started 1 week after.

## 2015-10-13 NOTE — ED Provider Notes (Signed)
CSN: [id number]     Arrival date & time 10/13/15  [id number] History   First MD Initiated Contact with Patient 10/13/15 1029     Chief Complaint  Patient presents with  . URI  . Blurred Vision   Patient is a 44 y.o. female presenting with eye problem. The history is provided by the patient.  Eye Problem Location:  Both Quality: blurred. Onset quality:  Gradual Duration:  1 week Timing:  Constant Chronicity:  New Context: not chemical exposure, not contact lens problem, not direct trauma and not foreign body   Context comment:  New medication Relieved by:  Nothing Worsened by:  Nothing tried Associated symptoms: blurred vision   Associated symptoms: no discharge, no double vision, no foreign body sensation, no headaches, no nausea, no photophobia, no redness, no swelling, no tearing and no vomiting     Past Medical History  Diagnosis Date  . Palpitations   . Psychosis   . BIPOLAR DISORDER   . Substance abuse   . Delusions ([place])   . Homelessness   . Left ventricular hypertrophy   . Sickle cell trait ([place])   . Homelessness    Past Surgical History  Procedure Laterality Date  . Bunionectomy     Family History  Problem Relation Age of Onset  . Heart disease Mother     CHF   Social History  Substance Use Topics  . Smoking status: Current Every Day Smoker -- 1.00 packs/day for 10 years    Types: Cigarettes  . Smokeless tobacco: None  . Alcohol Use: No   OB History    Gravida Para Term Preterm AB TAB SAB Ectopic Multiple Living   1              Review of Systems  Constitutional: Negative for fever.  HENT: Positive for congestion, dental problem, rhinorrhea and sore throat.   Eyes: Positive for blurred vision and visual disturbance. Negative for double vision, photophobia, pain, discharge and redness.  Respiratory: Negative for chest tightness and shortness of breath.   Cardiovascular: Negative for chest pain and palpitations.  Gastrointestinal: Negative for nausea,  vomiting, abdominal pain and constipation.  Genitourinary: Negative for dysuria and hematuria.  Musculoskeletal: Negative for back pain and neck pain.  Skin: Positive for rash.  Neurological: Negative for dizziness and headaches.  Psychiatric/Behavioral: Negative for confusion.  All other systems reviewed and are negative.     Allergies  Strawberry extract and Risperidone and related  Home Medications   Prior to Admission medications   Medication Sig Start Date End Date Taking? Authorizing Provider  ibuprofen (ADVIL,MOTRIN) 800 MG tablet Take 1 tablet (800 mg total) by mouth every 8 (eight) hours as needed for mild pain. 02/01/15   Kristen N Ward, DO  nitroGLYCERIN (NITROSTAT) 0.4 MG SL tablet Place 0.4 mg under the tongue every 5 (five) minutes as needed for chest pain.    Historical Provider, MD  potassium chloride SA (K-DUR,KLOR-CON) 20 MEQ tablet Take 1 tablet (20 mEq total) by mouth 2 (two) times daily. Patient not taking: Reported on 03/29/2015 03/24/15   Rolland Porter, MD  sodium chloride (OCEAN) 0.65 % SOLN nasal spray Place 1 spray into both nostrils as needed for congestion. 10/13/15 10/16/15  Gustavus Bryant, MD   BP 118/88 mmHg  Pulse 70  Temp(Src) 98.9 F (37.2 C) (Oral)  Resp 18  Ht 5\' 5"  (1.651 m)  SpO2 97%  LMP 10/08/2015 Physical Exam  Constitutional: She is oriented to person, place, and  time. She appears well-developed and well-nourished. No distress.  HENT:  Head: Normocephalic and atraumatic.  Nose: Mucosal edema present.  Mouth/Throat: Posterior oropharyngeal erythema present.  Eyes: EOM and lids are normal. Pupils are equal, round, and reactive to light. Right eye exhibits no discharge and no hordeolum. Left eye exhibits no discharge and no hordeolum. No scleral icterus.  Neck: Neck supple. No JVD present.  Cardiovascular: Normal rate, regular rhythm, normal heart sounds and intact distal pulses.  Exam reveals no gallop.   No murmur heard. Pulmonary/Chest:  Effort normal and breath sounds normal. She has no wheezes. She has no rales.  Abdominal: Soft. She exhibits no distension. There is no tenderness.  Musculoskeletal: Normal range of motion. She exhibits no tenderness.  Neurological: She is alert and oriented to person, place, and time. No cranial nerve deficit. She exhibits normal muscle tone.  Skin: Skin is warm and dry. No rash noted.  Hyperpigmented lesions between 4th and 5th toes on left  Psychiatric: Her behavior is normal.    ED Course  Procedures  None   MDM   Final diagnoses:  URI (upper respiratory infection)  Blurred vision, bilateral    Patient is a 44 year-old [place] American female with a history of bipolar, substance abuse, LVH, sickle cell trait 2 presents with blurred vision for one week as well as URI symptoms. Patient also has other complaints including a rash in between her toes on her left foot and dental pain. Patient states she was just prescribed Haldol and Cogentin 2 weeks ago. Both of these medications can cause blurred vision. She denies any eye trauma, headaches, diplopia, loss of vision. No sudden onset of symptoms. Extraocular movements normal. No conjunctival injection or pallor. No foreign body sensation. No exophthalmos or periorbital erythema. Patient has boggy nasal turbinates and posterior pharyngeal erythema. Feel this is consistent with viral URI may explain her dental pain. Suggested nasal saline spray. Fill her visual symptoms may be related to her new medications. Suggested she speak with her prescribing provider to have her medications reviewed. Patient is able to read the time on the wall clock accurately. Doubt acute ocular process. Nondiabetic so doubt retinopathy. Will d/c with podiatry contact info and saline nasal spray Rx. Stable for d/c.  Discussed with Dr. Reather Converse.  Gustavus Bryant, MD 10/13/15 1113  Elnora Morrison, MD 10/13/15 [phone]

## 2015-10-13 NOTE — Discharge Instructions (Signed)
Blurred Vision  Having blurred vision means that you cannot see things clearly. Your vision may seem fuzzy or out of focus. Blurred vision is a very common symptom of an eye or vision problem. Blurred vision is often a gradual blur that occurs in one eye or both eyes. There are many causes of blurred vision, including cataracts, macular degeneration, and diabetic retinopathy.  Blurred vision can be diagnosed based on your symptoms and a physical exam. Tell your health care provider about any other health problems you have, any recent eye injury, and any prior surgeries. You may need to see a health care provider who specializes in eye problems (ophthalmologist). Your treatment depends on what is causing your blurred vision.   HOME CARE INSTRUCTIONS   Tell your health care provider about any changes in your blurred vision.   Do not drive or operate heavy machinery if your vision is blurry.   Keep all follow-up visits as directed by your health care provider. This is important.  SEEK MEDICAL CARE IF:   Your symptoms get worse.   You have new symptoms.   You have trouble seeing at night.   You have trouble seeing up close or far away.   You have trouble noticing the difference between colors.  SEEK IMMEDIATE MEDICAL CARE IF:   You have severe eye pain.   You have a severe headache.   You have flashing lights in your field of vision.   You have a sudden change in vision.   You have a sudden loss of vision.   You have vision change after an injury.   You notice drainage coming from your eyes.   You notice a rash around your eyes.     This information is not intended to replace advice given to you by your health care provider. Make sure you discuss any questions you have with your health care provider.     Document Released: 10/21/2003 Document Revised: 03/04/2015 Document Reviewed: 09/11/2014  Elsevier Interactive Patient Education 2016 Elsevier Inc.

## 2015-10-13 NOTE — ED Notes (Signed)
Pt c/o cold symptoms x 4-5 days and blurred vision x 1 1/2 weeks. Pt unable to read large print. Pt c/o left upper tooth pain also ongoing for 1 year. Pt reports that her dentist refuses to see her because he released her from her practice.

## 2016-01-15 NOTE — Progress Notes (Signed)
HPI: 45 year old female for evaluation of chest pain. Patient seen in this office previously but not since December 2013. Review of outside records from [place] in February of 2013 showed sinus with occasional PAC. Echocardiogram in February of 2013 showed an ejection fraction of 70%. There was question right ventricular hypertrophy. There was mild mitral and tricuspid regurgitation. The patient had a stress echo in February of 2013. Ejection fraction was felt to be 45-50%. The ECG portion showed no ST changes but images could not be obtained. Note chest CT in October of 2013 showed no pulmonary embolus. Patient now complains of chest pain. The pain is in her left upper chest. It is described as a burning throbbing pain. It is not exertional, pleuritic, positional or related to food. No associated symptoms. Radiates to the back. Last approximately 10 minutes and resolves. She notes occasional dyspnea not related to activities. No pedal edema or syncope.  Current Outpatient Prescriptions  Medication Sig Dispense Refill  . ibuprofen (ADVIL,MOTRIN) 800 MG tablet Take 1 tablet (800 mg total) by mouth every 8 (eight) hours as needed for mild pain. 30 tablet 0  . potassium chloride SA (K-DUR,KLOR-CON) 20 MEQ tablet Take 1 tablet (20 mEq total) by mouth 2 (two) times daily. 30 tablet 0   No current facility-administered medications for this visit.    Allergies  Allergen Reactions  . Strawberry Extract Anaphylaxis  . Risperidone And Related Hives, Nausea Only and Other (See Comments)    Made me feel jittery and restless     Past Medical History  Diagnosis Date  . Palpitations   . Psychosis   . BIPOLAR DISORDER   . Substance abuse   . Delusions ([place])   . Homelessness   . Left ventricular hypertrophy   . Sickle cell trait [facility])     Past Surgical History  Procedure Laterality Date  . Bunionectomy      Social History   Social History  . Marital Status: Single    Spouse  Name: N/A  . Number of Children: 2  . Years of Education: N/A   Occupational History  .      Unemployed   Social History Main Topics  . Smoking status: Current Every Day Smoker -- 1.00 packs/day for 10 years    Types: Cigarettes  . Smokeless tobacco: Not on file  . Alcohol Use: No  . Drug Use: No     Comment: Previous cocaine, heroin, marijuana  . Sexual Activity: Not on file   Other Topics Concern  . Not on file   Social History Narrative    Family History  Problem Relation Age of Onset  . Heart disease Mother     CHF  . Prostate cancer Father     ROS: no fevers or chills, productive cough, hemoptysis, dysphasia, odynophagia, melena, hematochezia, dysuria, hematuria, rash, seizure activity, orthopnea, PND, pedal edema, claudication. Remaining systems are negative.  Physical Exam:   Blood pressure 110/70, pulse 65, height 5\' 5"  (1.651 m), weight 161 lb (73.029 kg).  General:  Well developed/well nourished in NAD Skin warm/dry Patient not depressed No peripheral clubbing Back-normal HEENT-normal/normal eyelids Neck supple/normal carotid upstroke bilaterally; no bruits; no JVD; no thyromegaly chest - CTA/ normal expansion CV - RRR/normal S1 and S2; no murmurs, rubs or gallops;  PMI nondisplaced Abdomen -NT/ND, no HSM, no mass, + bowel sounds, no bruit 2+ femoral pulses, no bruits Ext-no edema, chords, 2+ DP Neuro-grossly nonfocal  ECG NSR, nonspecific ST changes

## 2016-01-19 ENCOUNTER — Telehealth: Payer: Self-pay | Admitting: Cardiology

## 2016-01-19 NOTE — Telephone Encounter (Signed)
Received records from [facility] for appointment on 01/20/16 with Dr Stanford Breed.  Records given to [facility] (medical records) for Dr Jacalyn Lefevre schedule on 01/20/16. lp

## 2016-01-20 ENCOUNTER — Encounter: Payer: Self-pay | Admitting: Cardiology

## 2016-01-20 ENCOUNTER — Ambulatory Visit (INDEPENDENT_AMBULATORY_CARE_PROVIDER_SITE_OTHER): Payer: Medicaid Other | Admitting: Cardiology

## 2016-01-20 VITALS — BP 110/70 | HR 65 | Ht 65.0 in | Wt 161.0 lb

## 2016-01-20 DIAGNOSIS — R079 Chest pain, unspecified: Secondary | ICD-10-CM

## 2016-01-20 DIAGNOSIS — R072 Precordial pain: Secondary | ICD-10-CM

## 2016-01-20 DIAGNOSIS — R002 Palpitations: Secondary | ICD-10-CM

## 2016-01-20 DIAGNOSIS — Z720 Tobacco use: Secondary | ICD-10-CM | POA: Diagnosis not present

## 2016-01-20 HISTORY — DX: Chest pain, unspecified: R07.9

## 2016-01-20 MED ORDER — POTASSIUM CHLORIDE CRYS ER 20 MEQ PO TBCR: 20.0000 meq | EXTENDED_RELEASE_TABLET | Freq: Two times a day (BID) | ORAL | Status: DC

## 2016-01-20 NOTE — Patient Instructions (Signed)
Medication Instructions:   NO CHANGE  Testing/Procedures:  Your physician has requested that you have an echocardiogram. Echocardiography is a painless test that uses sound waves to create images of your heart. It provides your doctor with information about the size and shape of your heart and how well your heart's chambers and valves are working. This procedure takes approximately one hour. There are no restrictions for this procedure.   Your physician has requested that you have an exercise tolerance test. For further information please visit [email or web address]. Please also follow instruction sheet, as given.    Follow-Up:  Your physician recommends that you schedule a follow-up appointment in: AS NEEDED PENDING TEST RESULTS    Exercise Stress Electrocardiogram An exercise stress electrocardiogram is a test to check how blood flows to your heart. It is done to find areas of poor blood flow. You will need to walk on a treadmill for this test. The electrocardiogram will record your heartbeat when you are at rest and when you are exercising. BEFORE THE PROCEDURE  Do not have drinks with caffeine or foods with caffeine for 24 hours before the test, or as told by your doctor. This includes coffee, tea (even decaf tea), sodas, chocolate, and cocoa.  Follow your doctor's instructions about eating and drinking before the test.  Ask your doctor what medicines you should or should not take before the test. Take your medicines with water unless told by your doctor not to.  If you use an inhaler, bring it with you to the test.  Bring a snack to eat after the test.  Do not  smoke for 4 hours before the test.  Do not put lotions, powders, creams, or oils on your chest before the test.  Wear comfortable shoes and clothing. PROCEDURE  You will have patches put on your chest. Small areas of your chest may need to be shaved. Wires will be connected to the patches.  Your heart rate will be  watched while you are resting and while you are exercising.  You will walk on the treadmill. The treadmill will slowly get faster to raise your heart rate.  The test will take about 1-2 hours. AFTER THE PROCEDURE  Your heart rate and blood pressure will be watched after the test.  You may return to your normal diet, activities, and medicines or as told by your doctor.   This information is not intended to replace advice given to you by your health care provider. Make sure you discuss any questions you have with your health care provider.   Document Released: 04/05/2008 Document Revised: 11/08/2014 Document Reviewed: 06/25/2013 Elsevier Interactive Patient Education 2016 Elsevier Inc.    

## 2016-01-20 NOTE — Assessment & Plan Note (Signed)
Symptoms were atypical. We'll plan exercise treadmill for risk stratification. Repeat echocardiogram to assess LV function.

## 2016-01-20 NOTE — Assessment & Plan Note (Signed)
Not significantly problematic at present.

## 2016-01-20 NOTE — Assessment & Plan Note (Signed)
Patient counseled on discontinuing. 

## 2016-01-27 ENCOUNTER — Telehealth (HOSPITAL_COMMUNITY): Payer: Self-pay

## 2016-01-27 NOTE — Telephone Encounter (Signed)
Encounter complete. 

## 2016-01-28 ENCOUNTER — Telehealth (HOSPITAL_COMMUNITY): Payer: Self-pay

## 2016-01-28 NOTE — Telephone Encounter (Signed)
Encounter complete. 

## 2016-01-29 ENCOUNTER — Ambulatory Visit (HOSPITAL_COMMUNITY)
Admission: RE | Admit: 2016-01-29 | Discharge: 2016-01-29 | Disposition: A | Discharge status: Home / Self Care | Payer: Medicaid Other | Source: Ambulatory Visit | Attending: Cardiology | Admitting: Cardiology

## 2016-01-29 DIAGNOSIS — R072 Precordial pain: Secondary | ICD-10-CM | POA: Diagnosis not present

## 2016-01-30 LAB — EXERCISE TOLERANCE TEST
CHL CUP MPHR: 175 {beats}/min
CHL CUP RESTING HR STRESS: 81 {beats}/min
CSEPHR: 86 %
Estimated workload: 8.5 METS
Exercise duration (min): 7 min
Peak HR: 151 {beats}/min
RPE: 17

## 2016-02-05 ENCOUNTER — Other Ambulatory Visit (HOSPITAL_COMMUNITY): Payer: Medicaid Other

## 2016-02-09 ENCOUNTER — Ambulatory Visit (HOSPITAL_COMMUNITY): Payer: Medicaid Other | Attending: Cardiology

## 2016-02-09 ENCOUNTER — Other Ambulatory Visit: Payer: Self-pay

## 2016-02-09 DIAGNOSIS — R072 Precordial pain: Secondary | ICD-10-CM | POA: Diagnosis not present

## 2016-02-09 DIAGNOSIS — R079 Chest pain, unspecified: Secondary | ICD-10-CM | POA: Diagnosis present

## 2016-02-18 ENCOUNTER — Encounter: Payer: Self-pay | Admitting: *Deleted

## 2016-03-17 ENCOUNTER — Other Ambulatory Visit: Payer: Self-pay | Admitting: Internal Medicine

## 2016-03-17 DIAGNOSIS — Z1231 Encounter for screening mammogram for malignant neoplasm of breast: Secondary | ICD-10-CM

## 2016-11-22 ENCOUNTER — Encounter (HOSPITAL_COMMUNITY): Payer: Self-pay | Admitting: Emergency Medicine

## 2016-11-22 ENCOUNTER — Emergency Department (HOSPITAL_COMMUNITY)
Admission: EM | Admit: 2016-11-22 | Discharge: 2016-11-24 | Disposition: A | Discharge status: Home / Self Care | Payer: Medicaid Other | Attending: Emergency Medicine | Admitting: Emergency Medicine

## 2016-11-22 DIAGNOSIS — Z046 Encounter for general psychiatric examination, requested by authority: Secondary | ICD-10-CM | POA: Diagnosis not present

## 2016-11-22 DIAGNOSIS — Z79899 Other long term (current) drug therapy: Secondary | ICD-10-CM | POA: Diagnosis not present

## 2016-11-22 DIAGNOSIS — Z9889 Other specified postprocedural states: Secondary | ICD-10-CM | POA: Diagnosis not present

## 2016-11-22 DIAGNOSIS — F3162 Bipolar disorder, current episode mixed, moderate: Secondary | ICD-10-CM | POA: Diagnosis not present

## 2016-11-22 DIAGNOSIS — F17210 Nicotine dependence, cigarettes, uncomplicated: Secondary | ICD-10-CM | POA: Diagnosis not present

## 2016-11-22 DIAGNOSIS — R1310 Dysphagia, unspecified: Secondary | ICD-10-CM | POA: Diagnosis not present

## 2016-11-22 DIAGNOSIS — Z8249 Family history of ischemic heart disease and other diseases of the circulatory system: Secondary | ICD-10-CM | POA: Diagnosis not present

## 2016-11-22 DIAGNOSIS — I509 Heart failure, unspecified: Secondary | ICD-10-CM | POA: Diagnosis not present

## 2016-11-22 DIAGNOSIS — Z008 Encounter for other general examination: Secondary | ICD-10-CM

## 2016-11-22 DIAGNOSIS — Z8042 Family history of malignant neoplasm of prostate: Secondary | ICD-10-CM | POA: Diagnosis not present

## 2016-11-22 LAB — CBC
HCT: 38 % (ref 36.0–46.0)
Hemoglobin: 13.3 g/dL (ref 12.0–15.0)
MCH: 29.4 pg (ref 26.0–34.0)
MCHC: 35 g/dL (ref 30.0–36.0)
MCV: 83.9 fL (ref 78.0–100.0)
PLATELETS: 216 10*3/uL (ref 150–400)
RBC: 4.53 MIL/uL (ref 3.87–5.11)
RDW: 13.8 % (ref 11.5–15.5)
WBC: 6 10*3/uL (ref 4.0–10.5)

## 2016-11-22 LAB — COMPREHENSIVE METABOLIC PANEL
ALBUMIN: 3.6 g/dL (ref 3.5–5.0)
ALT: 11 U/L — ABNORMAL LOW (ref 14–54)
AST: 17 U/L (ref 15–41)
Alkaline Phosphatase: 44 U/L (ref 38–126)
Anion gap: 9 (ref 5–15)
BUN: 5 mg/dL — AB (ref 6–20)
CHLORIDE: 106 mmol/L (ref 101–111)
CO2: 24 mmol/L (ref 22–32)
Calcium: 9.1 mg/dL (ref 8.9–10.3)
Creatinine, Ser: 0.77 mg/dL (ref 0.44–1.00)
GFR calc Af Amer: >60 mL/min (ref >60)
Glucose, Bld: 98 mg/dL (ref 65–99)
POTASSIUM: 3.6 mmol/L (ref 3.5–5.1)
SODIUM: 139 mmol/L (ref 135–145)
Total Bilirubin: 0.6 mg/dL (ref 0.3–1.2)
Total Protein: 5.9 g/dL — ABNORMAL LOW (ref 6.5–8.1)

## 2016-11-22 LAB — RAPID URINE DRUG SCREEN, HOSP PERFORMED
AMPHETAMINES: NOT DETECTED
BENZODIAZEPINES: NOT DETECTED
Barbiturates: NOT DETECTED
Cocaine: NOT DETECTED
OPIATES: NOT DETECTED
Tetrahydrocannabinol: NOT DETECTED

## 2016-11-22 LAB — ETHANOL

## 2016-11-22 LAB — I-STAT BETA HCG BLOOD, ED (MC, WL, AP ONLY): I-stat hCG, quantitative: <5 m[IU]/mL (ref <5)

## 2016-11-22 LAB — ACETAMINOPHEN LEVEL: Acetaminophen (Tylenol), Serum: <10 ug/mL — ABNORMAL LOW (ref 10–30)

## 2016-11-22 LAB — SALICYLATE LEVEL

## 2016-11-22 MED ORDER — LORAZEPAM 1 MG PO TABS: 0.0000 mg | ORAL_TABLET | Freq: Two times a day (BID) | ORAL | Status: DC

## 2016-11-22 MED ORDER — LORAZEPAM 1 MG PO TABS: 0.0000 mg | ORAL_TABLET | Freq: Four times a day (QID) | ORAL | Status: DC

## 2016-11-22 MED ORDER — NICOTINE 21 MG/24HR TD PT24
21.0000 mg | MEDICATED_PATCH | Freq: Every day | TRANSDERMAL | Status: DC
  Administered 2016-11-22 – 2016-11-23 (×2): 21 mg via TRANSDERMAL
  Filled 2016-11-22 (×2): qty 1

## 2016-11-22 NOTE — ED Triage Notes (Signed)
Pt refuses to have blood drawn. Pt A/O on arrival to room T-8. Pt is questioning  What the complaint was . Pt reports she has tried to take out restraining orders on her neighbors but the police will not help.

## 2016-11-22 NOTE — ED Notes (Signed)
TTS being performed now.

## 2016-11-22 NOTE — Progress Notes (Signed)
Per Frederico Hamman, PA recommend a.m. Psych evaluation Ayyub Krall K. Nash Shearer, LPC-A, [facility]  Counselor 11/22/2016 10:23 PM

## 2016-11-22 NOTE — ED Notes (Signed)
Pt refusing blood work at present

## 2016-11-22 NOTE — ED Notes (Signed)
Reevaluate in AM for now per TTS.

## 2016-11-22 NOTE — ED Triage Notes (Signed)
Pt here with IVC papers not answering questions for this RN; pt IVC papers sts AH/VH and drug and ETOH abuse; pt noted increased paranoia and carrying a stick for protection; thinks her neighbors are stealing from her
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
## 2016-11-23 DIAGNOSIS — F3162 Bipolar disorder, current episode mixed, moderate: Secondary | ICD-10-CM | POA: Diagnosis present

## 2016-11-23 MED ORDER — HALOPERIDOL 5 MG PO TABS
5.0000 mg | ORAL_TABLET | Freq: Two times a day (BID) | ORAL | Status: DC
  Administered 2016-11-23 – 2016-11-24 (×3): 5 mg via ORAL
  Filled 2016-11-23 (×3): qty 1

## 2016-11-23 MED ORDER — DIVALPROEX SODIUM 250 MG PO DR TAB
250.0000 mg | DELAYED_RELEASE_TABLET | Freq: Two times a day (BID) | ORAL | Status: DC
  Administered 2016-11-23 – 2016-11-24 (×3): 250 mg via ORAL
  Filled 2016-11-23 (×3): qty 1
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
## 2016-11-23 NOTE — ED Notes (Signed)
Pt aware of tx plan - re-assess tomorrow. Pt asking about housing assistance d/t does not want to go back and live w/her mother. Advised pt will ask SW to consult.

## 2016-11-23 NOTE — ED Notes (Signed)
Pt on phone at nurses' desk. 
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
## 2016-11-23 NOTE — Progress Notes (Signed)
CSW engaged with Patient at her bedside. CSW introduced self, role of CSW, and provided housing resources. Patient reports that she currently lives with her mother and notes that it was supposed to be temporary. Patient reports that she has income and Medicaid and is able to pay for low income housing. Patient reports that she is going to have to return to her mother's home at discharge to get her belongings but is agreeable to stay in an extended stay hotel until low income housing becomes available. Patient also agreeable to meet with [place] [facility] for further housing assistance. Patient denies any further questions or concerns at this time. CSW signing off. Please consult should new need(s) arise.    Lorrine Kin, MSW, LCSW [facility] ED/32M Clinical Social Worker [phone]

## 2016-11-23 NOTE — ED Notes (Signed)
Pt given shower supplies as requested. Pt advised she will shower after a show she is watching is over.

## 2016-11-23 NOTE — ED Notes (Signed)
Sitting in chair watching tv.  

## 2016-11-23 NOTE — ED Notes (Signed)
IVC papers - 1st exam and recommendation completed by EDP on 11/22/16. Original placed in folder for Magistrate. Copies of each placed w/each IVC paper x3. Copy faxed to [facility] and copy sent to Medical Records.

## 2016-11-23 NOTE — ED Notes (Signed)
Left message on machine for SW.

## 2016-11-23 NOTE — ED Notes (Signed)
Pt states she lives w/her "mother's physical body". States she is getting older and more confused and less patient. States they argue over household chores. States she has 2 children.

## 2016-11-23 NOTE — ED Notes (Signed)
Telepsych being performed. 

## 2016-11-23 NOTE — ED Notes (Signed)
Regular Diet was ordered for Lunch. 

## 2016-11-23 NOTE — ED Notes (Signed)
Pt asking to take 2nd shower - advised may have shower after other pt's take theirs. Voiced understanding.

## 2016-11-24 ENCOUNTER — Encounter (HOSPITAL_COMMUNITY): Payer: Self-pay | Admitting: Emergency Medicine

## 2016-11-24 DIAGNOSIS — Z5321 Procedure and treatment not carried out due to patient leaving prior to being seen by health care provider: Secondary | ICD-10-CM | POA: Diagnosis not present

## 2016-11-24 DIAGNOSIS — Z9889 Other specified postprocedural states: Secondary | ICD-10-CM | POA: Diagnosis not present

## 2016-11-24 DIAGNOSIS — F17210 Nicotine dependence, cigarettes, uncomplicated: Secondary | ICD-10-CM

## 2016-11-24 DIAGNOSIS — F3162 Bipolar disorder, current episode mixed, moderate: Secondary | ICD-10-CM

## 2016-11-24 DIAGNOSIS — Z91018 Allergy to other foods: Secondary | ICD-10-CM

## 2016-11-24 DIAGNOSIS — Z79899 Other long term (current) drug therapy: Secondary | ICD-10-CM | POA: Diagnosis not present

## 2016-11-24 DIAGNOSIS — Z888 Allergy status to other drugs, medicaments and biological substances status: Secondary | ICD-10-CM

## 2016-11-24 DIAGNOSIS — Z8249 Family history of ischemic heart disease and other diseases of the circulatory system: Secondary | ICD-10-CM | POA: Diagnosis not present

## 2016-11-24 DIAGNOSIS — R1310 Dysphagia, unspecified: Secondary | ICD-10-CM | POA: Diagnosis present

## 2016-11-24 DIAGNOSIS — Z8042 Family history of malignant neoplasm of prostate: Secondary | ICD-10-CM

## 2016-11-24 LAB — CBC WITH DIFFERENTIAL/PLATELET
Basophils Absolute: 0 10*3/uL (ref 0.0–0.1)
Basophils Relative: 0 %
EOS PCT: 3 %
Eosinophils Absolute: 0.2 10*3/uL (ref 0.0–0.7)
HCT: 39.4 % (ref 36.0–46.0)
Hemoglobin: 14.1 g/dL (ref 12.0–15.0)
Lymphocytes Relative: 42 %
Lymphs Abs: 2.7 10*3/uL (ref 0.7–4.0)
MCH: 29.9 pg (ref 26.0–34.0)
MCHC: 35.8 g/dL (ref 30.0–36.0)
MCV: 83.7 fL (ref 78.0–100.0)
Monocytes Absolute: 0.3 10*3/uL (ref 0.1–1.0)
Monocytes Relative: 5 %
Neutro Abs: 3.2 10*3/uL (ref 1.7–7.7)
Neutrophils Relative %: 50 %
PLATELETS: 238 10*3/uL (ref 150–400)
RBC: 4.71 MIL/uL (ref 3.87–5.11)
RDW: 13.7 % (ref 11.5–15.5)
WBC: 6.3 10*3/uL (ref 4.0–10.5)

## 2016-11-24 NOTE — ED Notes (Signed)
A regular diet was ordered for Lunch, and Pt. Was given a snack and drink at snack time.
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
## 2016-11-24 NOTE — ED Triage Notes (Signed)
Patient with difficulty swallowing.  Patient is not drooling at this time and is able to swallow secretions but it is hard to do.  No sore throat, but does have pain to her right side of her jaw.  No swelling to the area noted.

## 2016-11-24 NOTE — ED Notes (Signed)
IVC resend paperwork completed by Dr Tyrone Nine and faxed to magistrate.

## 2016-11-24 NOTE — ED Notes (Signed)
Anita Hill, EMT called for patient for vitals with no answer

## 2016-11-25 ENCOUNTER — Emergency Department (HOSPITAL_COMMUNITY)
Admission: EM | Admit: 2016-11-25 | Discharge: 2016-11-25 | Disposition: A | Discharge status: Home / Self Care | Payer: Medicaid Other | Attending: Emergency Medicine | Admitting: Emergency Medicine

## 2016-11-25 NOTE — ED Notes (Signed)
Patient did not answer for vitals recheck

## 2017-02-01 ENCOUNTER — Ambulatory Visit (INDEPENDENT_AMBULATORY_CARE_PROVIDER_SITE_OTHER): Payer: Self-pay | Admitting: Physician Assistant

## 2017-03-19 ENCOUNTER — Emergency Department (HOSPITAL_COMMUNITY)
Admission: EM | Admit: 2017-03-19 | Discharge: 2017-03-19 | Disposition: A | Discharge status: Home / Self Care | Payer: Medicaid Other | Attending: Emergency Medicine | Admitting: Emergency Medicine

## 2017-03-19 ENCOUNTER — Encounter (HOSPITAL_COMMUNITY): Payer: Self-pay | Admitting: Emergency Medicine

## 2017-03-19 DIAGNOSIS — F17210 Nicotine dependence, cigarettes, uncomplicated: Secondary | ICD-10-CM | POA: Diagnosis not present

## 2017-03-19 DIAGNOSIS — Z5321 Procedure and treatment not carried out due to patient leaving prior to being seen by health care provider: Secondary | ICD-10-CM | POA: Diagnosis not present

## 2017-03-19 DIAGNOSIS — R0789 Other chest pain: Secondary | ICD-10-CM | POA: Diagnosis present

## 2017-03-19 NOTE — ED Triage Notes (Signed)
Reports left sided chest pressure since 2010.  Seen cardiology a year ago.  Not wanting lab work in triage.

## 2017-07-22 ENCOUNTER — Emergency Department (HOSPITAL_COMMUNITY): Admission: EM | Admit: 2017-07-22 | Discharge: 2017-07-22 | Discharge status: Against Medical Advice | Payer: Self-pay

## 2017-07-22 NOTE — ED Notes (Signed)
Pt seen leaving out of the lobby

## 2018-02-07 ENCOUNTER — Emergency Department (HOSPITAL_COMMUNITY): Payer: Medicaid Other

## 2018-02-07 ENCOUNTER — Emergency Department (HOSPITAL_COMMUNITY)
Admission: EM | Admit: 2018-02-07 | Discharge: 2018-02-07 | Disposition: A | Discharge status: Home / Self Care | Payer: Self-pay

## 2018-02-07 ENCOUNTER — Emergency Department (HOSPITAL_COMMUNITY)
Admission: EM | Admit: 2018-02-07 | Discharge: 2018-02-07 | Disposition: A | Discharge status: Home / Self Care | Payer: Medicaid Other | Attending: Emergency Medicine | Admitting: Emergency Medicine

## 2018-02-07 ENCOUNTER — Encounter (HOSPITAL_COMMUNITY): Payer: Self-pay | Admitting: Emergency Medicine

## 2018-02-07 DIAGNOSIS — K5900 Constipation, unspecified: Secondary | ICD-10-CM | POA: Diagnosis not present

## 2018-02-07 DIAGNOSIS — R200 Anesthesia of skin: Secondary | ICD-10-CM | POA: Diagnosis present

## 2018-02-07 DIAGNOSIS — Z5321 Procedure and treatment not carried out due to patient leaving prior to being seen by health care provider: Secondary | ICD-10-CM | POA: Insufficient documentation

## 2018-02-07 LAB — CBC
HEMATOCRIT: 43 % (ref 36.0–46.0)
Hemoglobin: 15.2 g/dL — ABNORMAL HIGH (ref 12.0–15.0)
MCH: 30.6 pg (ref 26.0–34.0)
MCHC: 35.3 g/dL (ref 30.0–36.0)
MCV: 86.7 fL (ref 78.0–100.0)
PLATELETS: 280 10*3/uL (ref 150–400)
RBC: 4.96 MIL/uL (ref 3.87–5.11)
RDW: 14.2 % (ref 11.5–15.5)
WBC: 5.9 10*3/uL (ref 4.0–10.5)

## 2018-02-07 LAB — BASIC METABOLIC PANEL
Anion gap: 11 (ref 5–15)
BUN: 7 mg/dL (ref 6–20)
CHLORIDE: 102 mmol/L (ref 101–111)
CO2: 24 mmol/L (ref 22–32)
CREATININE: 0.86 mg/dL (ref 0.44–1.00)
Calcium: 9 mg/dL (ref 8.9–10.3)
Glucose, Bld: 84 mg/dL (ref 65–99)
POTASSIUM: 3.7 mmol/L (ref 3.5–5.1)
SODIUM: 137 mmol/L (ref 135–145)

## 2018-02-07 LAB — I-STAT BETA HCG BLOOD, ED (MC, WL, AP ONLY)

## 2018-02-07 LAB — I-STAT TROPONIN, ED: Troponin i, poc: 0 ng/mL (ref 0.00–0.08)

## 2018-02-07 NOTE — ED Notes (Signed)
Called pt name for triage x4, no response from pt

## 2018-02-07 NOTE — ED Notes (Signed)
Pt states she also hasn't had a BM in a month.

## 2018-02-07 NOTE — ED Triage Notes (Signed)
Pt states she has numbness and tingling in both legs/toes. She states they "jump" when she sleeps. Pt also states she is SOB, and feels fluttering in her chest.
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
## 2018-02-07 NOTE — ED Notes (Signed)
Called x 3 and no answer in lobby

## 2018-02-07 NOTE — ED Notes (Signed)
Called pt name x3 for vitals, no response from pt.

## 2018-02-07 NOTE — ED Notes (Signed)
Pt. Stated, I was here and they said there was 10 pt ahead of me and that was a long time ago. Pt had left off an on. Discharged and is continuously talking to no one.

## 2018-03-03 ENCOUNTER — Emergency Department (HOSPITAL_COMMUNITY)
Admission: EM | Admit: 2018-03-03 | Discharge: 2018-03-03 | Disposition: A | Discharge status: Home / Self Care | Payer: Medicaid Other | Attending: Emergency Medicine | Admitting: Emergency Medicine

## 2018-03-03 ENCOUNTER — Encounter (HOSPITAL_COMMUNITY): Payer: Self-pay | Admitting: Emergency Medicine

## 2018-03-03 DIAGNOSIS — R079 Chest pain, unspecified: Secondary | ICD-10-CM | POA: Insufficient documentation

## 2018-03-03 DIAGNOSIS — Z5321 Procedure and treatment not carried out due to patient leaving prior to being seen by health care provider: Secondary | ICD-10-CM | POA: Diagnosis not present

## 2018-03-03 NOTE — ED Triage Notes (Signed)
BIB EMS from home with multiple complaints. Reports CP, flank pain, eye pain, and tingling to bilateral hands and feet. ETOH on board.

## 2018-03-03 NOTE — ED Triage Notes (Signed)
Pt refusing to let blood be drawn pt upset that she was never seen 02/07/18 d/t long wait time. Pt states she just wants to go. IV removed and pt left.

## 2018-04-12 ENCOUNTER — Encounter (HOSPITAL_COMMUNITY): Payer: Self-pay | Admitting: Emergency Medicine

## 2018-04-12 ENCOUNTER — Other Ambulatory Visit: Payer: Self-pay

## 2018-04-12 ENCOUNTER — Emergency Department (HOSPITAL_COMMUNITY)
Admission: EM | Admit: 2018-04-12 | Discharge: 2018-04-12 | Disposition: A | Discharge status: Home / Self Care | Payer: Medicaid Other | Attending: Emergency Medicine | Admitting: Emergency Medicine

## 2018-04-12 DIAGNOSIS — M79605 Pain in left leg: Secondary | ICD-10-CM

## 2018-04-12 DIAGNOSIS — F17210 Nicotine dependence, cigarettes, uncomplicated: Secondary | ICD-10-CM | POA: Insufficient documentation

## 2018-04-12 DIAGNOSIS — Y939 Activity, unspecified: Secondary | ICD-10-CM | POA: Diagnosis not present

## 2018-04-12 DIAGNOSIS — Y999 Unspecified external cause status: Secondary | ICD-10-CM | POA: Diagnosis not present

## 2018-04-12 DIAGNOSIS — Z79899 Other long term (current) drug therapy: Secondary | ICD-10-CM | POA: Diagnosis not present

## 2018-04-12 DIAGNOSIS — M79662 Pain in left lower leg: Secondary | ICD-10-CM | POA: Diagnosis not present

## 2018-04-12 DIAGNOSIS — T2006XA Burn of unspecified degree of forehead and cheek, initial encounter: Secondary | ICD-10-CM | POA: Diagnosis not present

## 2018-04-12 DIAGNOSIS — T304 Corrosion of unspecified body region, unspecified degree: Secondary | ICD-10-CM

## 2018-04-12 DIAGNOSIS — Y929 Unspecified place or not applicable: Secondary | ICD-10-CM | POA: Diagnosis not present

## 2018-04-12 DIAGNOSIS — I509 Heart failure, unspecified: Secondary | ICD-10-CM | POA: Insufficient documentation

## 2018-04-12 DIAGNOSIS — X19XXXA Contact with other heat and hot substances, initial encounter: Secondary | ICD-10-CM | POA: Diagnosis not present

## 2018-04-12 MED ORDER — NAPROXEN 500 MG PO TABS: 500.0000 mg | ORAL_TABLET | Freq: Two times a day (BID) | ORAL | 0 refills | Status: DC

## 2018-04-12 MED ORDER — TRIAMCINOLONE ACETONIDE 0.1 % EX CREA: 1.0000 "application " | TOPICAL_CREAM | Freq: Two times a day (BID) | CUTANEOUS | 0 refills | Status: DC

## 2018-04-12 NOTE — ED Notes (Addendum)
Pt stated she she also has a pain in her left leg in the back that shoots down from her upper thigh to her toes.
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
## 2018-04-12 NOTE — ED Notes (Signed)
No answer when called for treatment room x1

## 2018-04-12 NOTE — Discharge Instructions (Addendum)
Use kenalog cream on exposed areas of scelp affected by shampoo. Naproxen before and after walking to help with leg pain. Contact Medicaid office with your number.  You may have an assigned to [facility] physician. If unable to determine Medicaid provider.  Contact health and wellness for follow-up appointment.  This may take several weeks
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
## 2018-05-12 ENCOUNTER — Other Ambulatory Visit: Payer: Self-pay

## 2018-05-12 ENCOUNTER — Emergency Department (HOSPITAL_COMMUNITY)
Admission: EM | Admit: 2018-05-12 | Discharge: 2018-05-12 | Disposition: A | Discharge status: Home / Self Care | Payer: Medicaid Other | Attending: Emergency Medicine | Admitting: Emergency Medicine

## 2018-05-12 ENCOUNTER — Encounter (HOSPITAL_COMMUNITY): Payer: Self-pay | Admitting: *Deleted

## 2018-05-12 DIAGNOSIS — F17210 Nicotine dependence, cigarettes, uncomplicated: Secondary | ICD-10-CM | POA: Diagnosis not present

## 2018-05-12 DIAGNOSIS — I509 Heart failure, unspecified: Secondary | ICD-10-CM | POA: Insufficient documentation

## 2018-05-12 DIAGNOSIS — M79662 Pain in left lower leg: Secondary | ICD-10-CM | POA: Diagnosis present

## 2018-05-12 DIAGNOSIS — Z79899 Other long term (current) drug therapy: Secondary | ICD-10-CM | POA: Insufficient documentation

## 2018-05-12 DIAGNOSIS — G8929 Other chronic pain: Secondary | ICD-10-CM

## 2018-05-12 DIAGNOSIS — M79605 Pain in left leg: Secondary | ICD-10-CM

## 2018-05-12 MED ORDER — LIDOCAINE 5 % EX PTCH
1.0000 | MEDICATED_PATCH | CUTANEOUS | Status: DC
  Administered 2018-05-12: 1 via TRANSDERMAL
  Filled 2018-05-12: qty 1

## 2018-05-12 NOTE — ED Provider Notes (Signed)
Real Provider Note   CSN: [id number] Arrival date & time: 05/12/18  1032     History   Chief Complaint Chief Complaint  Patient presents with  . Leg Pain    HPI Anita Hill is a 47 y.o. female here for evaluation of left leg pain for 1 year.  Pain is localized from the left mid thigh to the left mid calf.  Pain is intermittent, worse with ambulation and at the end of the day.  Patient states that she walks a lot during the day at least 3 to 5 miles daily.  Notices that the pain is worse when she is trying to go to sleep and at night.  Described as a throbbing ache, radiating from thigh to lower leg.  Better at the beginning of the day and at rest.  States that she saw her doctor for this and was prescribed naproxen but did not fill it because she did not think it would help her pain.  She denies any trauma.  No leg swelling, redness, warmth, calf pain. No recent prolonged travel, immobilization, surgery. No h/o DVT/PE, malignancy, estrogen use.   HPI  Past Medical History:  Diagnosis Date  . BIPOLAR DISORDER   . Delusions ([place])   . Homelessness   . Left ventricular hypertrophy   . Palpitations   . Psychosis ([place])   . Sickle cell trait ([place])   . Substance abuse [facility])     Patient Active Problem List   Diagnosis Date Noted  . Bipolar mixed affective disorder, moderate ([place]) 11/23/2016  . Chest pain 01/20/2016  . Dyspnea 10/13/2012  . Tobacco abuse 07/06/2012  . Palpitations   . Psychosis ([place])   . Congestive heart failure ([place])   . BIPOLAR DISORDER 12/29/2006  . BACK PAIN, LOW 12/29/2006    Past Surgical History:  Procedure Laterality Date  . BUNIONECTOMY       OB History    Gravida  1   Para      Term      Preterm      AB      Living        SAB      TAB      Ectopic      Multiple      Live Births               Home Medications    Prior to Admission medications   Medication Sig Start  Date End Date Taking? Authorizing Provider  ibuprofen (ADVIL,MOTRIN) 800 MG tablet Take 1 tablet (800 mg total) by mouth every 8 (eight) hours as needed for mild pain. Patient not taking: Reported on 11/22/2016 02/01/15   Ward, Delice Bison, DO  naproxen (NAPROSYN) 500 MG tablet Take 1 tablet (500 mg total) by mouth 2 (two) times daily. 04/12/18   Tanna Furry, MD  potassium chloride SA (K-DUR,KLOR-CON) 20 MEQ tablet Take 1 tablet (20 mEq total) by mouth 2 (two) times daily. Patient not taking: Reported on 11/22/2016 01/20/16   Lelon Perla, MD  triamcinolone cream (KENALOG) 0.1 % Apply 1 application topically 2 (two) times daily. 04/12/18   Tanna Furry, MD    Family History Family History  Problem Relation Age of Onset  . Heart disease Mother        CHF  . Prostate cancer Father     Social History Social History   Tobacco Use  . Smoking status: Current Every Day Smoker  Packs/day: 1.00    Years: 10.00    Pack years: 10.00    Types: Cigarettes  . Smokeless tobacco: Never Used  Substance Use Topics  . Alcohol use: No  . Drug use: No    Comment: Previous cocaine, heroin, marijuana     Allergies   Strawberry extract; Latuda [lurasidone hcl]; and Risperidone and related   Review of Systems Review of Systems  Musculoskeletal: Positive for gait problem and myalgias.  All other systems reviewed and are negative.    Physical Exam Updated Vital Signs BP (!) 137/96 (BP Location: Right Arm)   Pulse 83   Temp 99.9 F (37.7 C) (Oral)   Resp 18   SpO2 99%   Physical Exam  Constitutional: She is oriented to person, place, and time. She appears well-developed and well-nourished. No distress.  NAD.  HENT:  Head: Normocephalic and atraumatic.  Right Ear: External ear normal.  Left Ear: External ear normal.  Nose: Nose normal.  Eyes: Conjunctivae and EOM are normal. No scleral icterus.  Neck: Normal range of motion. Neck supple.  Cardiovascular: Normal rate, regular rhythm  and normal heart sounds.  No murmur heard. 2+ DP and PT pulses bilaterally.  No asymmetric lower extremity/calf edema or tenderness.  Pulmonary/Chest: Effort normal and breath sounds normal. She has no wheezes.  Musculoskeletal: Normal range of motion. She exhibits no deformity.  L-spine: No midline tenderness. Pelvis: No tenderness to SI joints, sciatic notches bilaterally.  Pelvis without instability with AP/L compression. No focal bony tenderness to left knee, ankle.  Full passive range of motion of the left lower extremity including hip, knee, ankle without reported pain.  Ambulatory without assistance with minimal left antalgic gait.  Neurological: She is alert and oriented to person, place, and time.  Skin: Skin is warm and dry. Capillary refill takes less than 2 seconds.  No lower extremity erythema, warmth, rashes.  Psychiatric: She has a normal mood and affect. Her behavior is normal. Judgment and thought content normal.  Nursing note and vitals reviewed.    ED Treatments / Results  Labs (all labs ordered are listed, but only abnormal results are displayed) Labs Reviewed - No data to display  EKG None  Radiology No results found.  Procedures Procedures (including critical care time)  Medications Ordered in ED Medications  lidocaine (LIDODERM) 5 % 1 patch (1 patch Transdermal Patch Applied 05/12/18 1226)     Initial Impression / Assessment and Plan / ED Course  I have reviewed the triage vital signs and the nursing notes.  Pertinent labs & imaging results that were available during my care of the patient were reviewed by me and considered in my medical decision making (see chart for details).      47 year old female here with acute on chronic left lower extremity pain.  Onset 1 year ago, worsening over 8 months.  No history of DVT/PE, no risk factors for DVT.  No lower extremity asymmetric edema, calf tenderness.  No preceding trauma.  On exam, she has mild antalgic  gait but otherwise full painless range of motion and no focal bony abnormalities.  No signs of overlying cellulitis, septic arthritis, DVT or gout.  She does walk multiple miles a day and my suspicion is highest for overuse type injury.  Given benign exam without focal bony tenderness, trauma do not think imaging is indicated today.  Will discharge with high-dose NSAIDs, Lidoderm patches, crutches and reevaluation with orthopedics within 1 to 2 weeks.  Discussed return precautions.  Final Clinical Impressions(s) / ED Diagnoses   Final diagnoses:  Chronic pain of left lower extremity    ED Discharge Orders    None       Arlean Hopping 05/12/18 1415    Valarie Merino, MD 05/14/18 [phone]

## 2018-05-12 NOTE — ED Notes (Signed)
Called pt's name, no answer. Triage RN notified.

## 2018-05-12 NOTE — ED Notes (Signed)
Patient able to ambulate independently with knee sleeve and crutches

## 2018-05-12 NOTE — ED Triage Notes (Signed)
Pt in c/o left leg pain that started 6 months ago, pain is getting progressively worse, ambulatory to room

## 2018-05-12 NOTE — ED Notes (Signed)
Pt able to ambulate independently to room, gait steady and even

## 2018-05-12 NOTE — Progress Notes (Signed)
Orthopedic Tech Progress Note Patient Details:  Anita Hill [birth year] [id number]  Ortho Devices Type of Ortho Device: Crutches, Knee Sleeve Ortho Device/Splint Location: lle Ortho Device/Splint Interventions: Application   Post Interventions Patient Tolerated: Well Instructions Provided: Care of device   Hildred Priest 05/12/2018, 12:50 PM

## 2018-05-12 NOTE — ED Notes (Signed)
PA at bedside.

## 2018-05-12 NOTE — ED Notes (Signed)
Ortho made aware of need for crutches education

## 2018-05-12 NOTE — Discharge Instructions (Addendum)
You were seen in the emergency department for chronic leg pain for up to 8 to 12 months.  The cause is unclear.  Take 1000 mg of acetaminophen +600 mg of ibuprofen every 6-8 hours for the next week.  Additionally, use over the counter lidocaine patches (salonpas).  Use crutch to avoid further inflammation.  If symptoms are not improving or persistent, you need reevaluation by either your primary care doctor or orthopedist.  Return to the ER for leg swelling, redness, warmth, chest pain, shortness of breath

## 2018-05-15 ENCOUNTER — Telehealth (INDEPENDENT_AMBULATORY_CARE_PROVIDER_SITE_OTHER): Payer: Self-pay

## 2018-05-15 NOTE — Telephone Encounter (Signed)
Pt called to sch an appt for follow up of her LLE pain. She was evaluated in the ER on 05/12/18 and was advised to follow up with ortho in 1-2 weeks. The pt only stated that she was evaluated in the hospital and they " couldn't tell me anything" and I was trying to ask questions and review the chart to see when and with whom I needed to make the appt. The pt advised that " you can do all that later I just need an appt" and when I tried to explain to the pt that I was trying to find a day and a time for her she stated that " all you have to do is make an appt" I offered next Thursday with Dr. Sharol Given and she hung up the phone.

## 2018-05-19 ENCOUNTER — Encounter (HOSPITAL_COMMUNITY): Payer: Self-pay | Admitting: Emergency Medicine

## 2018-05-19 ENCOUNTER — Other Ambulatory Visit: Payer: Self-pay

## 2018-05-19 ENCOUNTER — Emergency Department (HOSPITAL_COMMUNITY)
Admission: EM | Admit: 2018-05-19 | Discharge: 2018-05-19 | Disposition: A | Discharge status: Home / Self Care | Payer: Medicaid Other | Attending: Emergency Medicine | Admitting: Emergency Medicine

## 2018-05-19 DIAGNOSIS — F319 Bipolar disorder, unspecified: Secondary | ICD-10-CM | POA: Diagnosis not present

## 2018-05-19 DIAGNOSIS — I509 Heart failure, unspecified: Secondary | ICD-10-CM | POA: Diagnosis not present

## 2018-05-19 DIAGNOSIS — F17210 Nicotine dependence, cigarettes, uncomplicated: Secondary | ICD-10-CM | POA: Insufficient documentation

## 2018-05-19 DIAGNOSIS — M79605 Pain in left leg: Secondary | ICD-10-CM | POA: Insufficient documentation

## 2018-05-19 DIAGNOSIS — D573 Sickle-cell trait: Secondary | ICD-10-CM | POA: Diagnosis not present

## 2018-05-19 DIAGNOSIS — Z79899 Other long term (current) drug therapy: Secondary | ICD-10-CM | POA: Insufficient documentation

## 2018-05-19 DIAGNOSIS — Z590 Homelessness: Secondary | ICD-10-CM | POA: Insufficient documentation

## 2018-05-19 LAB — CBC
HCT: 43.7 % (ref 36.0–46.0)
Hemoglobin: 14.9 g/dL (ref 12.0–15.0)
MCH: 29.9 pg (ref 26.0–34.0)
MCHC: 34.1 g/dL (ref 30.0–36.0)
MCV: 87.6 fL (ref 78.0–100.0)
PLATELETS: 212 10*3/uL (ref 150–400)
RBC: 4.99 MIL/uL (ref 3.87–5.11)
RDW: 13.1 % (ref 11.5–15.5)
WBC: 5.1 10*3/uL (ref 4.0–10.5)

## 2018-05-19 LAB — BASIC METABOLIC PANEL
ANION GAP: 11 (ref 5–15)
BUN: 15 mg/dL (ref 6–20)
CO2: 26 mmol/L (ref 22–32)
Calcium: 9.5 mg/dL (ref 8.9–10.3)
Chloride: 103 mmol/L (ref 98–111)
Creatinine, Ser: 1.09 mg/dL — ABNORMAL HIGH (ref 0.44–1.00)
GFR calc Af Amer: >60 mL/min (ref >60)
GFR calc non Af Amer: 59 mL/min — ABNORMAL LOW (ref >60)
Glucose, Bld: 92 mg/dL (ref 70–99)
Potassium: 3.7 mmol/L (ref 3.5–5.1)
SODIUM: 140 mmol/L (ref 135–145)

## 2018-05-19 MED ORDER — IBUPROFEN 800 MG PO TABS
800.0000 mg | ORAL_TABLET | Freq: Once | ORAL | Status: AC
Start: 2018-05-19 — End: 2018-05-19
  Administered 2018-05-19: 800 mg via ORAL
  Filled 2018-05-19: qty 1

## 2018-05-19 MED ORDER — IBUPROFEN 800 MG PO TABS: 800.0000 mg | ORAL_TABLET | Freq: Three times a day (TID) | ORAL | 0 refills | Status: DC | PRN

## 2018-05-19 NOTE — ED Provider Notes (Signed)
[place] EMERGENCY DEPARTMENT Provider Note   CSN: [id number] Arrival date & time: 05/19/18  0316     History   Chief Complaint Chief Complaint  Patient presents with  . Leg Pain    toe pain    HPI Anita Hill is a 47 y.o. female.  HPI   47 year old female who is homeless, history of bipolar, substance abuse, sickle cell trait presenting for evaluation of chronic left leg pain.  Patient report for the past for 5 months she has had recurrent pain to both legs, left greater than right.  Pain is described as a throbbing aching sensation to bilateral thigh, calf, and foot.  She reports pain primarily to her left leg more than right.  Pain worsening with prolonged walking and she admits that she was recently removed from her living domicile for the past week and having to walk from but stopped but stopped more frequent than usual. She was seen in the ED a week ago for her symptoms.  And anti-inflammatory medication.  It was felt that her symptoms likely due to overuse and she was given crutches, and anti-inflammatory medication.  Patient reports she lost her crutches and her symptoms worsen.  Patient denies having fever, chest pain, shortness of breath, productive cough, hemoptysis, back pain, focal numbness or weakness, bowel bladder incontinence or saddle anesthesia.  No prior history of PE DVT or DVT, no recent surgery, prolonged bedrest, active cancer, or the use of hormone.  Past Medical History:  Diagnosis Date  . BIPOLAR DISORDER   . Delusions ([place])   . Homelessness   . Left ventricular hypertrophy   . Palpitations   . Psychosis ([place])   . Sickle cell trait ([place])   . Substance abuse [facility])     Patient Active Problem List   Diagnosis Date Noted  . Bipolar mixed affective disorder, moderate ([place]) 11/23/2016  . Chest pain 01/20/2016  . Dyspnea 10/13/2012  . Tobacco abuse 07/06/2012  . Palpitations   . Psychosis ([place])   . Congestive heart failure  ([place])   . BIPOLAR DISORDER 12/29/2006  . BACK PAIN, LOW 12/29/2006    Past Surgical History:  Procedure Laterality Date  . BUNIONECTOMY       OB History    Gravida  1   Para      Term      Preterm      AB      Living        SAB      TAB      Ectopic      Multiple      Live Births               Home Medications    Prior to Admission medications   Medication Sig Start Date End Date Taking? Authorizing Provider  ibuprofen (ADVIL,MOTRIN) 800 MG tablet Take 1 tablet (800 mg total) by mouth every 8 (eight) hours as needed for mild pain. Patient not taking: Reported on 11/22/2016 02/01/15   Ward, Delice Bison, DO  naproxen (NAPROSYN) 500 MG tablet Take 1 tablet (500 mg total) by mouth 2 (two) times daily. 04/12/18   Tanna Furry, MD  potassium chloride SA (K-DUR,KLOR-CON) 20 MEQ tablet Take 1 tablet (20 mEq total) by mouth 2 (two) times daily. Patient not taking: Reported on 11/22/2016 01/20/16   Lelon Perla, MD  triamcinolone cream (KENALOG) 0.1 % Apply 1 application topically 2 (two) times daily. 04/12/18   Tanna Furry,  MD    Family History Family History  Problem Relation Age of Onset  . Heart disease Mother        CHF  . Prostate cancer Father     Social History Social History   Tobacco Use  . Smoking status: Current Every Day Smoker    Packs/day: 1.00    Years: 10.00    Pack years: 10.00    Types: Cigarettes  . Smokeless tobacco: Never Used  Substance Use Topics  . Alcohol use: No  . Drug use: No    Comment: Previous cocaine, heroin, marijuana     Allergies   Strawberry extract; Latuda [lurasidone hcl]; and Risperidone and related   Review of Systems Review of Systems  Constitutional: Negative for fever.  Musculoskeletal: Negative for back pain.  Skin: Negative for wound.  Neurological: Negative for numbness.     Physical Exam Updated Vital Signs BP 101/79 (BP Location: Right Arm)   Pulse 65   Temp 98 F (36.7 C) (Oral)   Resp  14   Ht 5' (1.524 m)   Wt 59 kg (130 lb)   SpO2 99%   BMI 25.39 kg/m   Physical Exam  Constitutional: She appears well-developed and well-nourished. No distress.  HENT:  Head: Atraumatic.  Eyes: Conjunctivae are normal.  Neck: Neck supple.  Cardiovascular: Normal rate and regular rhythm.  Pulmonary/Chest: Effort normal and breath sounds normal.  Abdominal: Soft. She exhibits no distension. There is no tenderness.  Musculoskeletal: She exhibits tenderness (Bilateral lower extremities: Mild tenderness to bilateral thigh on gentle palpation without any overlying skin changes.  No significant calf tenderness on palpation of both calves.  Normal hip flexion abduction abduction, knee flexion and extension ).  No appreciable edema noted to bilateral lower extremities.  Dorsalis pedis pulse palpable with brisk cap refill.  No tenderness to palpation of the foot.  Patient able to ablate without difficulty.  Neurological: She is alert.  Skin: No rash noted.  Psychiatric: She has a normal mood and affect.  Nursing note and vitals reviewed.    ED Treatments / Results  Labs (all labs ordered are listed, but only abnormal results are displayed) Labs Reviewed  BASIC METABOLIC PANEL - Abnormal; Notable for the following components:      Result Value   Creatinine, Ser 1.09 (*)    GFR calc non Af Amer 59 (*)    All other components within normal limits  CBC    EKG None  Radiology No results found.  Procedures Procedures (including critical care time)  Medications Ordered in ED Medications  ibuprofen (ADVIL,MOTRIN) tablet 800 mg (800 mg Oral Given 05/19/18 0659)     Initial Impression / Assessment and Plan / ED Course  I have reviewed the triage vital signs and the nursing notes.  Pertinent labs & imaging results that were available during my care of the patient were reviewed by me and considered in my medical decision making (see chart for details).     BP 101/79 (BP Location:  Right Arm)   Pulse 65   Temp 98 F (36.7 C) (Oral)   Resp 14   Ht 5' (1.524 m)   Wt 59 kg (130 lb)   SpO2 99%   BMI 25.39 kg/m    Final Clinical Impressions(s) / ED Diagnoses   Final diagnoses:  Left leg pain    ED Discharge Orders        Ordered    ibuprofen (ADVIL,MOTRIN) 800 MG tablet  Every  8 hours PRN     05/19/18 0707     7:05 AM Patient here with bilateral leg pain left greater than right.  This appears to be a chronic complaint patient does admits to increase ambulation.  She does not have any significant risk factor for DVT and does not have any edema to her lower extremity.  No red flags.  Able to emanate.  At this time, I felt this is likely more musculoskeletal and less likely to be infectious etiology or vascular problem.  Patient has misplaced her crutches, will provide additional crutches as well as anti-inflammatory medication.  She does have an appointment with orthopedic to follow-up as needed.  Return precautions discussed.   Domenic Moras, PA-C 05/19/18 0762    Ezequiel Essex, MD 05/19/18 [phone]

## 2018-05-19 NOTE — ED Triage Notes (Signed)
Pt reports ongoing L leg pain/toe pain, worse with ambulation. Reports numbness. Seen earlier this month and given crutches, lost them on the bus.  Per pt, previous provider concerned for blood clots. No swelling, warmth, erythema noted.

## 2018-05-21 ENCOUNTER — Emergency Department (HOSPITAL_COMMUNITY): Payer: Medicaid Other

## 2018-05-21 ENCOUNTER — Emergency Department (HOSPITAL_BASED_OUTPATIENT_CLINIC_OR_DEPARTMENT_OTHER): Payer: Medicaid Other

## 2018-05-21 ENCOUNTER — Encounter (HOSPITAL_COMMUNITY): Payer: Self-pay | Admitting: Emergency Medicine

## 2018-05-21 ENCOUNTER — Emergency Department (HOSPITAL_COMMUNITY)
Admission: EM | Admit: 2018-05-21 | Discharge: 2018-05-21 | Disposition: A | Discharge status: Home / Self Care | Payer: Medicaid Other | Attending: Emergency Medicine | Admitting: Emergency Medicine

## 2018-05-21 DIAGNOSIS — H538 Other visual disturbances: Secondary | ICD-10-CM | POA: Diagnosis not present

## 2018-05-21 DIAGNOSIS — R2681 Unsteadiness on feet: Secondary | ICD-10-CM | POA: Diagnosis not present

## 2018-05-21 DIAGNOSIS — R51 Headache: Secondary | ICD-10-CM | POA: Insufficient documentation

## 2018-05-21 DIAGNOSIS — R002 Palpitations: Secondary | ICD-10-CM | POA: Diagnosis not present

## 2018-05-21 DIAGNOSIS — M79604 Pain in right leg: Secondary | ICD-10-CM | POA: Insufficient documentation

## 2018-05-21 DIAGNOSIS — M79605 Pain in left leg: Secondary | ICD-10-CM | POA: Diagnosis not present

## 2018-05-21 DIAGNOSIS — F17210 Nicotine dependence, cigarettes, uncomplicated: Secondary | ICD-10-CM | POA: Diagnosis not present

## 2018-05-21 DIAGNOSIS — M79609 Pain in unspecified limb: Secondary | ICD-10-CM | POA: Diagnosis not present

## 2018-05-21 DIAGNOSIS — R519 Headache, unspecified: Secondary | ICD-10-CM

## 2018-05-21 MED ORDER — IBUPROFEN 400 MG PO TABS
600.0000 mg | ORAL_TABLET | Freq: Once | ORAL | Status: AC
  Administered 2018-05-21: 600 mg via ORAL
  Filled 2018-05-21: qty 1

## 2018-05-21 NOTE — ED Provider Notes (Signed)
[place] EMERGENCY DEPARTMENT Provider Note   CSN: [id number] Arrival date & time: 05/21/18  0236     History   Chief Complaint Chief Complaint  Patient presents with  . Headache    HPI Anita Hill is a 47 y.o. female.  She presents with a constellation of symptoms that it began about 4 years ago when she sustained a head injury, puncture wound above her right eye that she is concerned is injured her brain.  Since then she has been troubled with intermittent headaches and pain behind her right ear that she describes as a leaking sensation.  The headache she calls more as a discomfort that she gets when she is more tired.  She also feels her balance is off she feels better she has something to hold onto.  She is also complaining of some bilateral leg pain left greater than right and she is actually seen for this last week and had unremarkable labs.  She states she was referred on to neurology but was unable to get into the office because there was nobody there.  She has intermittent double vision and blurry vision.  Currently there is no chest pain or shortness of breath or abdominal pain no urinary symptoms.  She states her blood sugars get low at times and it causes her to be very irritable and improves with taking some sugar substance.  No fevers no chills.  She also complains of intermittent palpitations.  The history is provided by the patient.  Headache   This is a recurrent problem. The current episode started yesterday. The problem occurs constantly. The problem has been gradually improving. The headache is associated with emotional stress. The pain is located in the right unilateral region. The quality of the pain is described as dull. The pain is moderate. The pain does not radiate. Associated symptoms include malaise/fatigue. Pertinent negatives include no fever, no chest pressure, no near-syncope, no orthopnea, no palpitations, no syncope, no shortness of  breath, no nausea and no vomiting. She has tried nothing for the symptoms.    Past Medical History:  Diagnosis Date  . BIPOLAR DISORDER   . Delusions ([place])   . Homelessness   . Left ventricular hypertrophy   . Palpitations   . Psychosis ([place])   . Sickle cell trait ([place])   . Substance abuse [facility])     Patient Active Problem List   Diagnosis Date Noted  . Bipolar mixed affective disorder, moderate ([place]) 11/23/2016  . Chest pain 01/20/2016  . Dyspnea 10/13/2012  . Tobacco abuse 07/06/2012  . Palpitations   . Psychosis ([place])   . Congestive heart failure ([place])   . BIPOLAR DISORDER 12/29/2006  . BACK PAIN, LOW 12/29/2006    Past Surgical History:  Procedure Laterality Date  . BUNIONECTOMY       OB History    Gravida  1   Para      Term      Preterm      AB      Living        SAB      TAB      Ectopic      Multiple      Live Births               Home Medications    Prior to Admission medications   Medication Sig Start Date End Date Taking? Authorizing Provider  ibuprofen (ADVIL,MOTRIN) 800 MG tablet Take 1 tablet (800  mg total) by mouth every 8 (eight) hours as needed for mild pain or moderate pain. 05/19/18   Domenic Moras, PA-C  naproxen (NAPROSYN) 500 MG tablet Take 1 tablet (500 mg total) by mouth 2 (two) times daily. 04/12/18   Tanna Furry, MD  potassium chloride SA (K-DUR,KLOR-CON) 20 MEQ tablet Take 1 tablet (20 mEq total) by mouth 2 (two) times daily. Patient not taking: Reported on 11/22/2016 01/20/16   Lelon Perla, MD  triamcinolone cream (KENALOG) 0.1 % Apply 1 application topically 2 (two) times daily. 04/12/18   Tanna Furry, MD    Family History Family History  Problem Relation Age of Onset  . Heart disease Mother        CHF  . Prostate cancer Father     Social History Social History   Tobacco Use  . Smoking status: Current Every Day Smoker    Packs/day: 1.00    Years: 10.00    Pack years: 10.00    Types: Cigarettes  .  Smokeless tobacco: Never Used  Substance Use Topics  . Alcohol use: No  . Drug use: No    Comment: Previous cocaine, heroin, marijuana     Allergies   Strawberry extract; Latuda [lurasidone hcl]; and Risperidone and related   Review of Systems Review of Systems  Constitutional: Positive for malaise/fatigue. Negative for fever.  HENT: Negative for sore throat.   Eyes: Positive for visual disturbance.  Respiratory: Negative for shortness of breath.   Cardiovascular: Negative for chest pain, palpitations, orthopnea, syncope and near-syncope.  Gastrointestinal: Negative for abdominal pain, nausea and vomiting.  Genitourinary: Negative for dysuria.  Musculoskeletal: Positive for myalgias.  Skin: Negative for rash.  Neurological: Positive for headaches.     Physical Exam Updated Vital Signs BP 115/79 (BP Location: Right Arm)   Pulse (!) 57   Temp 99 F (37.2 C) (Oral)   Resp 16   Ht 5\' 5"  (1.651 m)   Wt 59 kg (130 lb)   SpO2 100%   BMI 21.63 kg/m   Physical Exam  Constitutional: She is oriented to person, place, and time. She appears well-developed and well-nourished. No distress.  HENT:  Head: Normocephalic and atraumatic.  Mouth/Throat: Oropharynx is clear and moist.  Eyes: Pupils are equal, round, and reactive to light. Conjunctivae and EOM are normal. Right eye exhibits normal extraocular motion and no nystagmus. Left eye exhibits normal extraocular motion and no nystagmus.  Neck: Normal range of motion. Neck supple. No tracheal deviation present. No Brudzinski's sign and no Kernig's sign noted.  Cardiovascular: Normal rate, regular rhythm, normal heart sounds and intact distal pulses.  No murmur heard. Pulmonary/Chest: Effort normal and breath sounds normal. No respiratory distress.  Abdominal: Soft. There is no tenderness.  Musculoskeletal: Normal range of motion. She exhibits no edema or deformity.  Neurological: She is alert and oriented to person, place, and  time. She has normal strength. No cranial nerve deficit or sensory deficit. Coordination normal. GCS eye subscore is 4. GCS verbal subscore is 5. GCS motor subscore is 6.  Skin: Skin is warm and dry. Capillary refill takes less than 2 seconds.  Psychiatric: She has a normal mood and affect.  Nursing note and vitals reviewed.    ED Treatments / Results  Labs (all labs ordered are listed, but only abnormal results are displayed) Labs Reviewed - No data to display  EKG None  Radiology Ct Head Wo Contrast  Result Date: 05/21/2018 CLINICAL DATA:  Headache and dizziness past couple  days.  No injury. EXAM: CT HEAD WITHOUT CONTRAST TECHNIQUE: Contiguous axial images were obtained from the base of the skull through the vertex without intravenous contrast. COMPARISON:  03/03/2013 FINDINGS: Brain: No evidence of acute infarction, hemorrhage, hydrocephalus, extra-axial collection or mass lesion/mass effect. Vascular: No hyperdense vessel or unexpected calcification. Skull: Normal. Negative for fracture or focal lesion. Sinuses/Orbits: No acute finding. Other: None. IMPRESSION: Normal head CT. Electronically Signed   By: Marin Olp M.D.   On: 05/21/2018 09:57    Procedures Procedures (including critical care time)  Medications Ordered in ED Medications - No data to display   Initial Impression / Assessment and Plan / ED Course  I have reviewed the triage vital signs and the nursing notes.  Pertinent labs & imaging results that were available during my care of the patient were reviewed by me and considered in my medical decision making (see chart for details).  Clinical Course as of May 21 1809  Sun May 22, 5571  6788 47 year old female with a constellation of symptoms.  I reviewed her prior visits and her recent tests.  She had a fairly normal set of labs done about a week ago.  She has not had a CT imaging of her head in the last 4 years so I would repeat that and she is also asking if she  can get a duplex of her lower extremities because she is concerned she has a DVT.  I do not think eventually I am going to come up with a diagnosis for her but it would not be unreasonable to get her in with neurology due to her various neurologic symptoms.   [MB]    Clinical Course User Index [MB] Hayden Rasmussen, MD     Final Clinical Impressions(s) / ED Diagnoses   Final diagnoses:  Chronic nonintractable headache, unspecified headache type  Bilateral leg pain  Unsteady gait    ED Discharge Orders    None       Hayden Rasmussen, MD 05/21/18 1810

## 2018-05-21 NOTE — Discharge Instructions (Addendum)
You were evaluated in the emergency department for frequent headaches and unsteady gait and bilateral leg pain.  You had an ultrasound of the did not show any signs of blood clot and a CAT scan that was normal of your brain.  We are giving the number for the neurology clinic and will be important for you to schedule some follow-up with them.  Please also follow-up with your primary care doctor.  Tylenol or ibuprofen as needed for pain.

## 2018-05-21 NOTE — ED Triage Notes (Signed)
Reports having headache for a couple of days with a sensation of something leaking behind right ear.  Also endorses feeling off balance.  This has been going on for years since being diagnosed with LVH borderline right mitral valve in 2015.

## 2018-05-21 NOTE — Progress Notes (Signed)
VASCULAR LAB PRELIMINARY  PRELIMINARY  PRELIMINARY  PRELIMINARY  Bilateral lower extremity venous duplex completed.    Preliminary report:  There is no DVT or SVT noted in the bilateral lower extremities.   Called results to Dr. Matthew Folks, [facility], RVT 05/21/2018, 8:52 AM

## 2018-05-21 NOTE — ED Notes (Signed)
ED Provider at bedside. 

## 2018-05-21 NOTE — ED Notes (Signed)
Pt given discharge instructions. States she has no questions and will not be signing anything. Pt refusing to sign for discharge papers. Ambulatory.

## 2018-05-21 NOTE — ED Notes (Signed)
Pt provided with [place] sandwich, crackers, peanut butter, and water at this time.

## 2018-05-21 NOTE — ED Notes (Signed)
Patient transported to CT 

## 2018-05-23 ENCOUNTER — Ambulatory Visit (INDEPENDENT_AMBULATORY_CARE_PROVIDER_SITE_OTHER): Payer: Self-pay | Admitting: Orthopaedic Surgery

## 2018-05-30 ENCOUNTER — Ambulatory Visit (INDEPENDENT_AMBULATORY_CARE_PROVIDER_SITE_OTHER): Payer: Medicaid Other

## 2018-05-30 ENCOUNTER — Encounter (INDEPENDENT_AMBULATORY_CARE_PROVIDER_SITE_OTHER): Payer: Self-pay | Admitting: Orthopaedic Surgery

## 2018-05-30 ENCOUNTER — Ambulatory Visit (INDEPENDENT_AMBULATORY_CARE_PROVIDER_SITE_OTHER): Payer: Medicaid Other | Admitting: Orthopaedic Surgery

## 2018-05-30 DIAGNOSIS — M545 Low back pain: Secondary | ICD-10-CM

## 2018-05-30 DIAGNOSIS — G8929 Other chronic pain: Secondary | ICD-10-CM

## 2018-05-30 NOTE — Progress Notes (Addendum)
Office Visit Note   Patient: Anita Hill           Date of Birth: [birth year]           MRN: [id number] Visit Date: 05/30/2018              Requested by: Windell Hummingbird, PA-C [address] [place], [place] [address] PCP: Windell Hummingbird, PA-C   Assessment & Plan: Visit Diagnoses:  1. Chronic low back pain, unspecified back pain laterality, with sciatica presence unspecified     Plan: Patient has nonspecific lower extremity symptoms of pain in reporting numbness that are not consistent with a clear orthopedic etiology.  Suspect this may be important to underlying neurologic issue.  She does state that she had a referral to neurology but has not made an appointment. Her lumbar xrays today did show some lumbar spondylosis and mild DDD. - will refer to physical therapy - New referral for neurology - continue NSAIDs as needed for pain relief  Follow-Up Instructions: Return if symptoms worsen or fail to improve.   Orders:  Orders Placed This Encounter  Procedures  . XR Lumbar Spine 2-3 Views  . Ambulatory referral to Neurology   No orders of the defined types were placed in this encounter.     Procedures: No procedures performed   Clinical Data: No additional findings.   Subjective: Chief Complaint  Patient presents with  . Left Leg - Pain    HPI Patient is a 47 year old female who presents with lower extremity pain bilaterally.  Left is greater than right.  This is been going on for the last year.  She denies any initial injury.  She is been evaluated on several occasions to include recently in the emergency department where they performed an ultrasound to rule out DVT.  Patient localizes her proximal thigh pain to the anterior lateral aspect of the thigh.  She also reports numbness of the bilateral lower extremity from the knee down.   she does not identify any localization of these symptoms of numbness.  The numbness has been present for about 3 months.  She does  report feeling of some weakness associate with the pain.  She occasionally loses her balance but denies any falls.  She previously got some relief with using crutches but has since lost these.  She takes Tylenol ibuprofen on occasion which is helpful for the pain.  She denies any lower extremity swelling, bruising, erythema, or other skin changes.  She denies any back pain.  She denies any saddle anesthesia or bowel/bladder symptoms.   Review of Systems  Constitutional: Negative.   HENT: Negative.   Eyes: Negative.   Respiratory: Negative.   Cardiovascular: Negative.   Endocrine: Negative.   Musculoskeletal: Negative.   Neurological: Negative.   Hematological: Negative.   Psychiatric/Behavioral: Negative.   All other systems reviewed and are negative.  See HPI  Objective: Vital Signs: There were no vitals taken for this visit.  Physical Exam  Constitutional: She is oriented to person, place, and time. She appears well-developed and well-nourished.  HENT:  Head: Normocephalic and atraumatic.  Eyes: EOM are normal.  Neck: Neck supple.  Pulmonary/Chest: Effort normal.  Abdominal: Soft.  Neurological: She is alert and oriented to person, place, and time.  Skin: Skin is warm. Capillary refill takes less than 2 seconds.  Psychiatric: She has a normal mood and affect. Her behavior is normal. Judgment and thought content normal.  Nursing note and vitals reviewed.  GEN: Awake, alert, NAD   Ortho Exam  Bilateral Hips:  - Inspection: No gross deformity, no swelling, erythema, or ecchymosis - Palpation: Patient communicated tenderness over the left anterior lateral thigh. Mild TTP over the right lateral thigh. No focal tenderness. - ROM: Normal range of motion on Flexion, extension, abduction, adduction, internal and external rotation - Strength: Normal strength. - Neuro/vasc: NV intact bilaterally in the LE - Special Tests: Negative FABER and FADIR.  Negative logroll   Specialty  Comments:  No specialty comments available.  Imaging: Xr Lumbar Spine 2-3 Views  Result Date: 05/30/2018 Lumbar spondylosis and mild degenerative disc disease at L5-S1 and L4-L5.    PMFS History: Patient Active Problem List   Diagnosis Date Noted  . Bipolar mixed affective disorder, moderate ([place]) 11/23/2016  . Chest pain 01/20/2016  . Dyspnea 10/13/2012  . Tobacco abuse 07/06/2012  . Palpitations   . Psychosis ([place])   . Congestive heart failure ([place])   . BIPOLAR DISORDER 12/29/2006  . BACK PAIN, LOW 12/29/2006   Past Medical History:  Diagnosis Date  . BIPOLAR DISORDER   . Delusions ([place])   . Homelessness   . Left ventricular hypertrophy   . Palpitations   . Psychosis ([place])   . Sickle cell trait ([place])   . Substance abuse ([place])     Family History  Problem Relation Age of Onset  . Heart disease Mother        CHF  . Prostate cancer Father     Past Surgical History:  Procedure Laterality Date  . BUNIONECTOMY     Social History   Occupational History    Comment: Unemployed  Tobacco Use  . Smoking status: Current Every Day Smoker    Packs/day: 1.00    Years: 10.00    Pack years: 10.00    Types: Cigarettes  . Smokeless tobacco: Never Used  Substance and Sexual Activity  . Alcohol use: No  . Drug use: No    Comment: Previous cocaine, heroin, marijuana  . Sexual activity: Not on file

## 2018-06-04 ENCOUNTER — Emergency Department (HOSPITAL_COMMUNITY)
Admission: EM | Admit: 2018-06-04 | Discharge: 2018-06-04 | Disposition: A | Discharge status: Home / Self Care | Payer: Medicaid Other | Attending: Emergency Medicine | Admitting: Emergency Medicine

## 2018-06-04 ENCOUNTER — Other Ambulatory Visit: Payer: Self-pay

## 2018-06-04 DIAGNOSIS — Z79899 Other long term (current) drug therapy: Secondary | ICD-10-CM | POA: Insufficient documentation

## 2018-06-04 DIAGNOSIS — F17210 Nicotine dependence, cigarettes, uncomplicated: Secondary | ICD-10-CM | POA: Diagnosis not present

## 2018-06-04 DIAGNOSIS — M79605 Pain in left leg: Secondary | ICD-10-CM | POA: Insufficient documentation

## 2018-06-04 MED ORDER — CYCLOBENZAPRINE HCL 10 MG PO TABS: 10.0000 mg | ORAL_TABLET | Freq: Two times a day (BID) | ORAL | 0 refills | Status: DC | PRN

## 2018-06-04 MED ORDER — KETOROLAC TROMETHAMINE 15 MG/ML IJ SOLN
30.0000 mg | Freq: Once | INTRAMUSCULAR | Status: AC
  Administered 2018-06-04: 30 mg via INTRAMUSCULAR
  Filled 2018-06-04: qty 2
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
## 2018-06-04 NOTE — ED Provider Notes (Signed)
[facility] EMERGENCY DEPARTMENT Provider Note   CSN: [id number] Arrival date & time: 06/04/18  0537     History   Chief Complaint Chief Complaint  Patient presents with  . Leg Pain    HPI Anita Hill is a 47 y.o. female who presents with bilateral leg pain left worse than right.  Past medical history significant for chronic leg pain.  She states that she has multiple evaluations for this.  She has been taking ibuprofen 600 mg without relief and states that she previously was getting good relief with this medicine.  She is homeless and states that her pain is worsened by prolonged periods of sitting, standing, walking which she has to do often.  She had crutches however lost them when she was on the bus.  She followed up with orthopedic and had lumbar x-rays which showed some degenerative changes but the patient denies any back pain.  Orthopedics was not sure that this was a musculoskeletal issue but referred her to physical therapy and neurology.  She has made an appointment with neurology which is coming up and she has an appointment with physical therapy this morning however the pain is so severe that she came to the emergency department for pain relief.  The pain is circumferential around her left thigh and sometimes her lower leg as well.  She reports associated numbness and tingling that goes from her knee to her foot.   HPI  Past Medical History:  Diagnosis Date  . BIPOLAR DISORDER   . Delusions ([place])   . Homelessness   . Left ventricular hypertrophy   . Palpitations   . Psychosis ([place])   . Sickle cell trait ([place])   . Substance abuse [facility])     Patient Active Problem List   Diagnosis Date Noted  . Bipolar mixed affective disorder, moderate ([place]) 11/23/2016  . Chest pain 01/20/2016  . Dyspnea 10/13/2012  . Tobacco abuse 07/06/2012  . Palpitations   . Psychosis ([place])   . Congestive heart failure ([place])   . BIPOLAR DISORDER 12/29/2006  . BACK PAIN,  LOW 12/29/2006    Past Surgical History:  Procedure Laterality Date  . BUNIONECTOMY       OB History    Gravida  1   Para      Term      Preterm      AB      Living        SAB      TAB      Ectopic      Multiple      Live Births               Home Medications    Prior to Admission medications   Medication Sig Start Date End Date Taking? Authorizing Provider  acetaminophen (TYLENOL) 325 MG tablet Take 325 mg by mouth every 6 (six) hours as needed for headache.    [provider]  ibuprofen (ADVIL,MOTRIN) 600 MG tablet Take 600 mg by mouth every 6 (six) hours as needed for fever, headache or moderate pain.    [provider]  ibuprofen (ADVIL,MOTRIN) 800 MG tablet Take 1 tablet (800 mg total) by mouth every 8 (eight) hours as needed for mild pain or moderate pain. Patient not taking: Reported on 05/21/2018 05/19/18   Domenic Moras, PA-C  naproxen (NAPROSYN) 500 MG tablet Take 1 tablet (500 mg total) by mouth 2 (two) times daily. Patient not taking: Reported on 05/21/2018  04/12/18   Tanna Furry, MD  potassium chloride SA (K-DUR,KLOR-CON) 20 MEQ tablet Take 1 tablet (20 mEq total) by mouth 2 (two) times daily. Patient not taking: Reported on 11/22/2016 01/20/16   Lelon Perla, MD  Tetrahydrozoline HCl (VISINE EXTRA OP) Apply 2 drops to eye daily as needed (for watery eyes).    [provider]  triamcinolone cream (KENALOG) 0.1 % Apply 1 application topically 2 (two) times daily. Patient taking differently: Apply 1 application topically 2 (two) times daily as needed (for itchy scalp).  04/12/18   Tanna Furry, MD    Family History Family History  Problem Relation Age of Onset  . Heart disease Mother        CHF  . Prostate cancer Father     Social History Social History   Tobacco Use  . Smoking status: Current Every Day Smoker    Packs/day: 1.00    Years: 10.00    Pack years: 10.00    Types: Cigarettes  . Smokeless tobacco: Never  Used  Substance Use Topics  . Alcohol use: No  . Drug use: No    Comment: Previous cocaine, heroin, marijuana     Allergies   Strawberry extract; Latuda [lurasidone hcl]; and Risperidone and related   Review of Systems Review of Systems  Musculoskeletal: Positive for myalgias. Negative for back pain and joint swelling.  Neurological: Positive for numbness. Negative for weakness.     Physical Exam Updated Vital Signs BP 133/86   Pulse 68   Temp 98.7 F (37.1 C) (Oral)   Resp 16   Ht 5\' 5"  (1.651 m)   Wt 59 kg (130 lb)   LMP 05/30/2018   SpO2 98%   BMI 21.63 kg/m   Physical Exam  Constitutional: She is oriented to person, place, and time. She appears well-developed and well-nourished. No distress.  Thin, calm, cooperative  HENT:  Head: Normocephalic and atraumatic.  Eyes: Pupils are equal, round, and reactive to light. Conjunctivae are normal. Right eye exhibits no discharge. Left eye exhibits no discharge. No scleral icterus.  Neck: Normal range of motion.  Cardiovascular: Normal rate.  Pulmonary/Chest: Effort normal. No respiratory distress.  Abdominal: She exhibits no distension.  Musculoskeletal:  No back tenderness.  Diffuse tenderness of the L thigh. FROM of hip and knee joints. 2+ DP pulse  Neurological: She is alert and oriented to person, place, and time.  Skin: Skin is warm and dry.  Psychiatric: She has a normal mood and affect. Her behavior is normal.  Nursing note and vitals reviewed.    ED Treatments / Results  Labs (all labs ordered are listed, but only abnormal results are displayed) Labs Reviewed - No data to display  EKG None  Radiology No results found.  Procedures Procedures (including critical care time)  Medications Ordered in ED Medications  ketorolac (TORADOL) 15 MG/ML injection 30 mg (30 mg Intramuscular Given 06/04/18 0753)     Initial Impression / Assessment and Plan / ED Course  I have reviewed the triage vital signs  and the nursing notes.  Pertinent labs & imaging results that were available during my care of the patient were reviewed by me and considered in my medical decision making (see chart for details).  47 year old female presents with acute on chronic left leg pain.  Her vital signs are normal.  Unclear etiology of her symptoms.  Possibly musculoskeletal versus neurologic.  She has normal pulses so doubt a vascular cause.  She was given IM  Toradol and a new set of crutches.  She is an appointment with physical therapy today which she is going to go to.  She will follow-up with neurology.  Final Clinical Impressions(s) / ED Diagnoses   Final diagnoses:  Left leg pain    ED Discharge Orders    None       Recardo Evangelist, PA-C 06/04/18 3832    Quintella Reichert, MD 06/06/18 [phone]

## 2018-06-04 NOTE — ED Triage Notes (Signed)
Patient was referred to ortho dr for pain in left, upper thigh (front and back). Was given RX for ibuprofen with no relief. Has had back xray and [place] with no definitive dx of what the problem is; patient wants relief from pain. Walks unassisted and gait steady.

## 2018-06-04 NOTE — Progress Notes (Signed)
Orthopedic Tech Progress Note Patient Details:  Anita Hill Oct 22, 1971 [id number]  Ortho Devices Type of Ortho Device: Crutches Ortho Device/Splint Interventions: Application   Post Interventions Patient Tolerated: Well Instructions Provided: Care of device   Hildred Priest 06/04/2018, 8:13 AM

## 2018-06-07 ENCOUNTER — Telehealth (INDEPENDENT_AMBULATORY_CARE_PROVIDER_SITE_OTHER): Payer: Self-pay | Admitting: Orthopaedic Surgery

## 2018-06-07 ENCOUNTER — Other Ambulatory Visit (INDEPENDENT_AMBULATORY_CARE_PROVIDER_SITE_OTHER): Payer: Self-pay

## 2018-06-07 NOTE — Telephone Encounter (Signed)
Patient came into the clinic stating that the place that is closest to her, she was not able to find it.  She is wanting to know if there is another location she can be referred to that is more readily accessible for her to get to.  CB#[phone].  Thank you.

## 2018-06-07 NOTE — Telephone Encounter (Signed)
I am ok with any of the [facility] as I believe they are only ones that will accept ins.  Does she need a new rx?

## 2018-06-07 NOTE — Telephone Encounter (Signed)
Called to advise on message. She states she got the correct address. Advised her is she needed anything else to give [place] a call. She states she needed some medicine for fungus(nails are cracked) advised her she we do not do that but I would be more than happy to send message to Dr Erlinda Hong. Advised her we can make referral to a podiatrist. States she needs to have referral through PCP and would need to f/u , etc said no thank you and ended conversation.

## 2018-06-07 NOTE — Telephone Encounter (Signed)
See message.

## 2018-06-14 ENCOUNTER — Other Ambulatory Visit: Payer: Self-pay

## 2018-06-14 ENCOUNTER — Ambulatory Visit: Payer: Medicaid Other | Attending: Orthopaedic Surgery | Admitting: Physical Therapy

## 2018-06-14 ENCOUNTER — Encounter: Payer: Self-pay | Admitting: Physical Therapy

## 2018-06-14 ENCOUNTER — Other Ambulatory Visit (INDEPENDENT_AMBULATORY_CARE_PROVIDER_SITE_OTHER): Payer: Self-pay

## 2018-06-14 DIAGNOSIS — R293 Abnormal posture: Secondary | ICD-10-CM | POA: Diagnosis present

## 2018-06-14 DIAGNOSIS — M79604 Pain in right leg: Secondary | ICD-10-CM | POA: Insufficient documentation

## 2018-06-14 DIAGNOSIS — R2689 Other abnormalities of gait and mobility: Secondary | ICD-10-CM | POA: Diagnosis present

## 2018-06-14 DIAGNOSIS — M79605 Pain in left leg: Secondary | ICD-10-CM | POA: Diagnosis present

## 2018-06-14 DIAGNOSIS — G8929 Other chronic pain: Secondary | ICD-10-CM

## 2018-06-14 DIAGNOSIS — M545 Low back pain: Principal | ICD-10-CM

## 2018-06-14 NOTE — Therapy (Addendum)
[place], [place], [address] Phone: [phone]   Fax:  [phone]  Physical Therapy Evaluation  Patient Details  Name: [facility]  MRN: [id number] Date of Birth: 1970/11/30 Referring Provider: Frankey Shown, MD   Encounter Date: 06/14/2018  PT End of Session - 06/14/18 1403    Visit Number  1    Number of Visits  13    Date for PT Re-Evaluation  07/26/18    Authorization Type  MCD (reassess at 4th visit)    PT Start Time  1018    PT Stop Time  1101    PT Time Calculation (min)  43 min    Activity Tolerance  Patient tolerated treatment well    Behavior During Therapy  [facility] for tasks assessed/performed       Past Medical History:  Diagnosis Date  . BIPOLAR DISORDER   . Delusions ([place])   . Homelessness   . Left ventricular hypertrophy   . Palpitations   . Psychosis ([place])   . Sickle cell trait ([place])   . Substance abuse [facility])     Past Surgical History:  Procedure Laterality Date  . BUNIONECTOMY      There were no vitals filed for this visit.   Subjective Assessment - 06/14/18 1029    Subjective  Her leg pain started around 2017. She reports she had some increased soreness in bilateral legs (L is much worse than R). It has gotten worse since the leg pain started a few years ago and it has become harder for her to walk from the bus stop to home. She has developed a limp about a year ago. Most pain is located in L thigh. She reports some numbess in her L knee. She just recently had a sharp pain that traveled into her L hip and butt area. The pain bothers her when she is just sitting but it gets much worse when she has to walk to the bus stop (2-4 blocks).    Limitations  Lifting;Standing;Walking;Sitting;House hold activities    How long can you sit comfortably?  20 min    How long can you stand comfortably?  1-2 hours    How long can you walk comfortably?  34min-1 hour    Patient Stated Goals   better understanding of what is happening to her legs    Currently in Pain?  Yes    Pain Score  0-No pain   10/10 pain   Pain Location  Leg    Pain Orientation  Left;Right    Pain Descriptors / Indicators  Numbness    Pain Type  Chronic pain    Pain Radiating Towards  occasionally to L hip    Pain Onset  Other (comment)   2 years ago   Pain Frequency  Intermittent    Aggravating Factors   walking, standing    Pain Relieving Factors  ibuprofen, hot shower         [facility] PT Assessment - 06/14/18 0001      Assessment   Medical Diagnosis  lower extremity pain    Referring Provider  Frankey Shown, MD    Onset Date/Surgical Date  --   2017   Hand Dominance  Right    Next MD Visit  --   not currently, wants her to go to neurology     Precautions   Precautions  None      Restrictions   Weight Bearing Restrictions  No      Balance Screen   Has the patient fallen in the past 6 months  No    Has the patient had a decrease in activity level because of a fear of falling?   No    Is the patient reluctant to leave their home because of a fear of falling?   No      Home Environment   Living Environment  Shelter/Homeless   currently in a resource center     Prior Function   Level of Independence  Independent    Leisure  working out      Cognition   Overall Cognitive Status  Within Functional Limits for tasks assessed      Observation/Other Assessments   Observations  Pt with decreased spinal curves      Posture/Postural Control   Posture/Postural Control  Postural limitations    Postural Limitations  Rounded Shoulders;Forward head      ROM / Strength   AROM / PROM / Strength  AROM;Strength      AROM   AROM Assessment Site  Hip;Knee;Lumbar    Right/Left Knee  Right;Left    Lumbar Flexion  80   pain in L thigh   Lumbar Extension  20   pain in bilateral thighs   Lumbar - Right Side Bend  22   pain in bilateral thighs   Lumbar - Left Side Bend  22   pain in bilateral  thighs     Strength   Strength Assessment Site  Hip;Knee    Right/Left Hip  Right;Left    Right Hip Flexion  4+/5    Right Hip Extension  3+/5    Right Hip ABduction  4+/5    Left Hip Flexion  4+/5    Left Hip Extension  3+/5    Left Hip ABduction  3+/5   painful in thigh and near ankle   Right/Left Knee  Right;Left    Right Knee Flexion  4/5    Right Knee Extension  4+/5    Left Knee Flexion  4/5    Left Knee Extension  5/5      Palpation   Palpation comment  no tenderness to palpation over R quad or hip flexors      Special Tests    Special Tests  Knee Special Tests;Hip Special Tests    Hip Special Tests   Ely's Test      [facility] Test    Findings  Positive    Side  Left    Comments  painful with knee bend      Ely's Test   Findings  Positive    Side  Left    Comments  --   with report of pain, no hip hike sign     Ambulation/Gait   Gait Pattern  Decreased hip/knee flexion - left;Antalgic                Objective measurements completed on examination: See above findings.              PT Education - 06/14/18 1402    Education Details  examination findings, POC, HEP    Person(s) Educated  Patient    Methods  Explanation;Handout    Comprehension  Verbalized understanding       PT Short Term Goals - 06/14/18 1414      PT SHORT TERM GOAL #1   Title  Pt will be I with HEP    Baseline  HEP issued today    Time  3    Period  Weeks    Status  New    Target Date  07/05/18      PT SHORT TERM GOAL #2   Title  Pt will demonstrate/verbalize proper posture during sitting and standing to reduce pain to </=2/10 and improve function    Baseline  Pt with forward head and rounded shoulders    Time  3    Period  Weeks    Status  New    Target Date  07/05/18        PT Long Term Goals - 06/14/18 1415      PT LONG TERM GOAL #1   Title  Pt will improve LLE strength to >/=4+ with </= 2/10 pain to improve functional mobility    Baseline  hip flex  4+, ext 3+, abd 3+; knee flex 4    Time  6    Period  Weeks    Status  New    Target Date  07/26/18      PT LONG TERM GOAL #2   Title  Pt will be able to walk >/= 4 blocks with </= 2/10 in order to walk to the bus stop    Baseline  pt unable to walk > 2 blocks without pain    Time  6    Period  Weeks    Status  New    Target Date  07/26/18      PT LONG TERM GOAL #3   Title  Pt will report </= 2/10 pain in RLE during lumbar AROM to improve quality of life    Baseline  pain in RLE with all lumbar AROM    Time  6    Period  Weeks    Status  New    Target Date  07/26/18      PT LONG TERM GOAL #4   Title  Pt will be I with HEP by final visit to ensure progress beyond physical therapy discharge    Baseline  HEP issued today    Time  6    Period  Weeks    Status  New    Target Date  07/26/18             Plan - 06/14/18 1403    Clinical Impression Statement  Pt is a 47 yo female referred to OPPT for lower extremity pain. Pt reports this has been bothering her for almost 2 years (since 2017). The pain on the anterior aspect of her L thigh. She states her R leg does not bother her near as much as the L. Upon evaluation, pt presents with decreased hip strength in the LLE. She has a positive Thomas test on the L with increased pain when increasing knee flexion. She reports increased pain in anterior bilateral thighs during lumbar extension and pain in L thigh with other lumbar motions. Based on assessment, pt would benefit from OPPT services to address pain, ROM, strength, gait, and functional mobility.    History and Personal Factors relevant to plan of care:  homeless, sickle cell trait    Clinical Presentation  Evolving    Clinical Presentation due to:  pain, dec strength and ROM    Clinical Decision Making  Moderate    Rehab Potential  Good    PT Frequency  2x / week    PT Duration  6 weeks    PT Treatment/Interventions  ADLs/Self Care Home  Management;Cryotherapy;Electrical  Stimulation;Iontophoresis 4mg /ml Dexamethasone;Moist Heat;Gait training;Stair training;Functional mobility training;Therapeutic activities;Therapeutic exercise;Balance training;Neuromuscular re-education;Patient/family education;Manual techniques;Passive range of motion;Dry needling;Energy conservation;Taping    PT Next Visit Plan  hip and core strengthening, manual and modalities prn, gait training    PT Home Exercise Plan  hip flexor stretch, pelvic tilt, seated thoracic rotation    Consulted and Agree with Plan of Care  Patient       Patient will benefit from skilled therapeutic intervention in order to improve the following deficits and impairments:  Abnormal gait, Impaired sensation, Improper body mechanics, Pain, Decreased mobility, Increased muscle spasms, Postural dysfunction, Decreased activity tolerance, Decreased endurance, Decreased range of motion, Decreased strength, Hypomobility, Decreased balance, Difficulty walking  Visit Diagnosis: Pain in right leg  Abnormal posture  Other abnormalities of gait and mobility     Problem List Patient Active Problem List   Diagnosis Date Noted  . Bipolar mixed affective disorder, moderate ([place]) 11/23/2016  . Chest pain 01/20/2016  . Dyspnea 10/13/2012  . Tobacco abuse 07/06/2012  . Palpitations   . Psychosis ([place])   . Congestive heart failure ([place])   . BIPOLAR DISORDER 12/29/2006  . BACK PAIN, LOW 12/29/2006   Worthy Flank, SPT 06/14/18 2:45 PM   [facility] [address] [place], [place], [address] Phone: [phone]   Fax:  [phone]  Name: Anita Hill MRN: [id number] Date of Birth: Jan 19, 1971

## 2018-06-21 ENCOUNTER — Telehealth (INDEPENDENT_AMBULATORY_CARE_PROVIDER_SITE_OTHER): Payer: Self-pay | Admitting: Orthopaedic Surgery

## 2018-06-21 NOTE — Telephone Encounter (Signed)
Talked with patient and advised her of message per Dr. Erlinda Hong.  Patient stated that she will go to the Emergency Department.

## 2018-06-21 NOTE — Telephone Encounter (Signed)
Patient left a voicemail requesting something stronger than ibuprofen. She said she was in a lot of pain and that she has been set up with an appt for neurology in September. Patient would like a call back # [phone]

## 2018-06-21 NOTE — Telephone Encounter (Signed)
Please see message below concerning a Rx stronger than Ibuprofen.  Thank You

## 2018-06-21 NOTE — Telephone Encounter (Signed)
She has been referred to neurology.  We cannot provide her with pain meds.

## 2018-06-22 ENCOUNTER — Emergency Department (HOSPITAL_COMMUNITY)
Admission: EM | Admit: 2018-06-22 | Discharge: 2018-06-22 | Disposition: A | Discharge status: Home / Self Care | Payer: Medicaid Other | Attending: Emergency Medicine | Admitting: Emergency Medicine

## 2018-06-22 ENCOUNTER — Other Ambulatory Visit: Payer: Self-pay

## 2018-06-22 ENCOUNTER — Encounter (HOSPITAL_COMMUNITY): Payer: Self-pay | Admitting: Emergency Medicine

## 2018-06-22 DIAGNOSIS — M79605 Pain in left leg: Secondary | ICD-10-CM | POA: Diagnosis present

## 2018-06-22 DIAGNOSIS — F17210 Nicotine dependence, cigarettes, uncomplicated: Secondary | ICD-10-CM | POA: Diagnosis not present

## 2018-06-22 DIAGNOSIS — M25552 Pain in left hip: Secondary | ICD-10-CM | POA: Diagnosis not present

## 2018-06-22 MED ORDER — ACETAMINOPHEN 500 MG PO TABS
1000.0000 mg | ORAL_TABLET | Freq: Once | ORAL | Status: AC
  Administered 2018-06-22: 1000 mg via ORAL
  Filled 2018-06-22: qty 2

## 2018-06-22 NOTE — Discharge Instructions (Signed)
Add tylenol 500 mg, one to two tablets, to the ibuprofen dose you are currently taking every eight hours. Take the tylenol and the ibuprofen at the same time. Do this dosing regimen for one week. Follow-up with neurology as scheduled.

## 2018-06-22 NOTE — ED Triage Notes (Addendum)
Pt reports hx of blood clots in both legs. Pt reports bilateral leg that has been bothering her for the last 2 weeks. Pt did go to the orthopedic doctor without resolvement of sx. Pt was given a referral to rehab with exercises but has been unable to do stretches d/t pain. Denies any injuries. Able to ambulate with crutch and move legs. Not on blood thinners currently. Denies cp, sob.  EMS BP 134/84, HR 80, R 18, 99% room air.

## 2018-06-22 NOTE — ED Notes (Signed)
PA and RN went to find patient. Pt not found in room

## 2018-06-22 NOTE — ED Notes (Signed)
Pt reports having a left leg sonogram that was reported negative but pt stated that she saw the knot on the scan.

## 2018-06-22 NOTE — ED Provider Notes (Signed)
[facility] Provider Note   CSN: [id number] Arrival date & time: 06/22/18  1839     History   Chief Complaint Chief Complaint  Patient presents with  . Leg Pain    HPI Anita Hill is a 47 y.o. female.  Patient presents to ED with complaint of chronic leg pain. She has been seen multiple times in the ED, and has followed up with orthopedics. She is scheduled to see neurology on 07/19/18. She has been taking ibuprofen without relief of her pain. She has been in physical therapy, but states that the activity makes her pain worse. Current pain Is located in left thigh, and involves the left hip and left knee as well.  The history is provided by the patient and medical records. No language interpreter was used.  Extremity Pain  This is a chronic problem. The current episode started more than 1 week ago. The problem occurs daily. The problem has been gradually worsening.    Past Medical History:  Diagnosis Date  . BIPOLAR DISORDER   . Delusions ([place])   . Homelessness   . Left ventricular hypertrophy   . Palpitations   . Psychosis ([place])   . Sickle cell trait ([place])   . Substance abuse [facility])     Patient Active Problem List   Diagnosis Date Noted  . Bipolar mixed affective disorder, moderate ([place]) 11/23/2016  . Chest pain 01/20/2016  . Dyspnea 10/13/2012  . Tobacco abuse 07/06/2012  . Palpitations   . Psychosis ([place])   . Congestive heart failure ([place])   . BIPOLAR DISORDER 12/29/2006  . BACK PAIN, LOW 12/29/2006    Past Surgical History:  Procedure Laterality Date  . BUNIONECTOMY       OB History    Gravida  1   Para      Term      Preterm      AB      Living        SAB      TAB      Ectopic      Multiple      Live Births               Home Medications    Prior to Admission medications   Medication Sig Start Date End Date Taking? Authorizing Provider  acetaminophen (TYLENOL) 325 MG tablet Take  325 mg by mouth every 6 (six) hours as needed for headache.    [provider]  cyclobenzaprine (FLEXERIL) 10 MG tablet Take 1 tablet (10 mg total) by mouth 2 (two) times daily as needed for muscle spasms. Patient not taking: Reported on 06/14/2018 06/04/18   Recardo Evangelist, PA-C  ibuprofen (ADVIL,MOTRIN) 600 MG tablet Take 600 mg by mouth every 6 (six) hours as needed for fever, headache or moderate pain.    [provider]  ibuprofen (ADVIL,MOTRIN) 800 MG tablet Take 1 tablet (800 mg total) by mouth every 8 (eight) hours as needed for mild pain or moderate pain. Patient not taking: Reported on 05/21/2018 05/19/18   Domenic Moras, PA-C  naproxen (NAPROSYN) 500 MG tablet Take 1 tablet (500 mg total) by mouth 2 (two) times daily. Patient not taking: Reported on 05/21/2018 04/12/18   Tanna Furry, MD  potassium chloride SA (K-DUR,KLOR-CON) 20 MEQ tablet Take 1 tablet (20 mEq total) by mouth 2 (two) times daily. Patient not taking: Reported on 11/22/2016 01/20/16   Lelon Perla, MD  Tetrahydrozoline HCl Karma Lew  EXTRA OP) Apply 2 drops to eye daily as needed (for watery eyes).    [provider]  triamcinolone cream (KENALOG) 0.1 % Apply 1 application topically 2 (two) times daily. Patient not taking: Reported on 06/14/2018 04/12/18   Tanna Furry, MD    Family History Family History  Problem Relation Age of Onset  . Heart disease Mother        CHF  . Prostate cancer Father     Social History Social History   Tobacco Use  . Smoking status: Current Every Day Smoker    Packs/day: 1.00    Years: 10.00    Pack years: 10.00    Types: Cigarettes, Cigars  . Smokeless tobacco: Never Used  Substance Use Topics  . Alcohol use: Yes    Comment: everyday 20-80oz  . Drug use: No    Comment: Previous cocaine, heroin, marijuana     Allergies   Strawberry extract; Latuda [lurasidone hcl]; and Risperidone and related   Review of Systems Review of Systems  Constitutional:  Negative for fever and unexpected weight change.  Cardiovascular: Negative for leg swelling.  Genitourinary: Negative for dysuria.  Musculoskeletal: Positive for back pain.  Skin: Negative for rash.  Neurological: Negative for numbness.  All other systems reviewed and are negative.    Physical Exam Updated Vital Signs BP (!) 122/98   Pulse 72   Temp 98.4 F (36.9 C) (Oral)   Resp 18   Ht 5\' 5"  (1.651 m)   Wt 61.2 kg   LMP 05/30/2018   SpO2 99%   BMI 22.47 kg/m   Physical Exam  Constitutional: She is oriented to person, place, and time. She appears well-developed and well-nourished. No distress.  HENT:  Head: Atraumatic.  Eyes: Conjunctivae are normal.  Neck: Neck supple.  Cardiovascular: Normal rate and regular rhythm.  Pulmonary/Chest: Effort normal and breath sounds normal.  Abdominal: Soft.  Musculoskeletal: Normal range of motion. She exhibits no edema or deformity.  Neurological: She is alert and oriented to person, place, and time. No sensory deficit.  Skin: Skin is warm and dry.  Psychiatric: She has a normal mood and affect.  Nursing note and vitals reviewed.    ED Treatments / Results  Labs (all labs ordered are listed, but only abnormal results are displayed) Labs Reviewed - No data to display  EKG None  Radiology No results found.  Procedures Procedures (including critical care time)  Medications Ordered in ED Medications - No data to display   Initial Impression / Assessment and Plan / ED Course  I have reviewed the triage vital signs and the nursing notes.  Pertinent labs & imaging results that were available during my care of the patient were reviewed by me and considered in my medical decision making (see chart for details).    Prior radiology and vascular studies reviewed. No dvt/svt. No acute findings in lumbar films.   Patient with chronic left leg pain with multiple evaluations.  She is scheduled to see neurology next month.  Conservative therapy recommended and discussed. Will alter medication regimen to include tylenol concurrently with ibuprofen. Patient will be discharged home & is agreeable with above plan. Returns precautions discussed. Pt appears safe for discharge.  Final Clinical Impressions(s) / ED Diagnoses   Final diagnoses:  Left leg pain    ED Discharge Orders    None       Etta Quill, NP 06/23/18 0145    Valarie Merino, MD 06/23/18 [phone]

## 2018-06-26 ENCOUNTER — Encounter: Payer: Self-pay | Admitting: Physical Therapy

## 2018-06-26 ENCOUNTER — Ambulatory Visit: Payer: Medicaid Other | Admitting: Physical Therapy

## 2018-06-26 DIAGNOSIS — M79604 Pain in right leg: Secondary | ICD-10-CM | POA: Diagnosis not present

## 2018-06-26 DIAGNOSIS — R293 Abnormal posture: Secondary | ICD-10-CM

## 2018-06-26 DIAGNOSIS — M79605 Pain in left leg: Secondary | ICD-10-CM

## 2018-06-26 DIAGNOSIS — R2689 Other abnormalities of gait and mobility: Secondary | ICD-10-CM

## 2018-06-26 NOTE — Therapy (Signed)
[place], [place], [address] Phone: [phone]   Fax:  [phone]  Physical Therapy Treatment  Patient Details  Name: Anita Hill MRN: [id number] Date of Birth: 1971/10/15 Referring Provider: Frankey Shown, MD   Encounter Date: 06/26/2018  PT End of Session - 06/26/18 1006    Visit Number  2    Number of Visits  13    Date for PT Re-Evaluation  07/26/18    Authorization Type  MCD (reassess at 4th visit)    PT Start Time  1000    PT Stop Time  1055    PT Time Calculation (min)  55 min    Activity Tolerance  Patient tolerated treatment well    Behavior During Therapy  [facility] for tasks assessed/performed       Past Medical History:  Diagnosis Date  . BIPOLAR DISORDER   . Delusions ([place])   . Homelessness   . Left ventricular hypertrophy   . Palpitations   . Psychosis ([place])   . Sickle cell trait ([place])   . Substance abuse [facility])     Past Surgical History:  Procedure Laterality Date  . BUNIONECTOMY      There were no vitals filed for this visit.  Subjective Assessment - 06/26/18 1002    Subjective  pt arriving to therpay reporting pain in her left leg of 2/10 at rest. Pt reporting her pain is keeping her up at night. Pt reports she sleeps on her stomach. Pt still reporting diffictulty walking to the bus stop.     Limitations  Lifting;Standing;Walking;Sitting;House hold activities    How long can you sit comfortably?  20 min    How long can you stand comfortably?  1-2 hours    How long can you walk comfortably?  16min-1 hour    Patient Stated Goals  better understanding of what is happening to her legs    Currently in Pain?  Yes    Pain Score  2     Pain Location  Leg    Pain Orientation  Left    Pain Descriptors / Indicators  Pins and needles;Discomfort;Burning;Aching   pinching feeling   Pain Type  Acute pain    Pain Onset  More than a month ago    Pain Frequency  Constant    Aggravating Factors   walking, sleeping, standing prolonged    Pain Relieving Factors  ibuprofen, hot shower         [facility] PT Assessment - 06/26/18 0001      Assessment   Medical Diagnosis  lower extremity pain    Referring Provider  Frankey Shown, MD      Precautions   Precautions  None      Restrictions   Weight Bearing Restrictions  No      Balance Screen   Has the patient fallen in the past 6 months  No    Has the patient had a decrease in activity level because of a fear of falling?   No    Is the patient reluctant to leave their home because of a fear of falling?   No      Home Environment   Living Environment  Shelter/Homeless   currently in a resource center     Prior Function   Level of Independence  Independent      Cognition   Overall Cognitive Status  Within Functional Limits for tasks assessed      Observation/Other  Assessments   Observations  Pt with decreased spinal curves      Posture/Postural Control   Posture/Postural Control  Postural limitations    Postural Limitations  Rounded Shoulders;Forward head                   [facility] Adult PT Treatment/Exercise - 06/26/18 0001      Exercises   Exercises  Knee/Hip      Knee/Hip Exercises: Stretches   Active Hamstring Stretch  Both;3 reps;30 seconds    Piriformis Stretch  Left;3 reps;30 seconds;Limitations    Piriformis Stretch Limitations  1 rep on the right LE      Knee/Hip Exercises: Aerobic   Nustep  L3 x 6 minutes      Knee/Hip Exercises: Supine   Hip Adduction Isometric  Both;15 reps    Bridges  10 reps    Straight Leg Raises  Strengthening;10 reps      Knee/Hip Exercises: Sidelying   Hip ABduction  15 reps;Limitations    Clams  15 reps      Modalities   Modalities  Moist Heat      Moist Heat Therapy   Number Minutes Moist Heat  15 Minutes    Moist Heat Location  Lumbar Spine   left hip     Manual Therapy   Manual Therapy  Soft tissue mobilization;Myofascial release     Manual therapy comments  15 minutes    Soft tissue mobilization  IASTM: Lumbar paraspinals, left gluteals, L IT band/ lateral left hip    Myofascial Release  left piriformis             PT Education - 06/26/18 1005    Education Details  HEP    Person(s) Educated  Patient    Methods  Explanation;Demonstration;Verbal cues    Comprehension  Verbalized understanding;Returned demonstration       PT Short Term Goals - 06/26/18 1056      PT SHORT TERM GOAL #1   Title  Pt will be I with HEP    Baseline  HEP issued today    Time  3    Status  On-going      PT SHORT TERM GOAL #2   Title  Pt will demonstrate/verbalize proper posture during sitting and standing to reduce pain to </=2/10 and improve function    Baseline  Pt with forward head and rounded shoulders    Period  Weeks        PT Long Term Goals - 06/26/18 1057      PT LONG TERM GOAL #1   Title  Pt will improve LLE strength to >/=4+ with </= 2/10 pain to improve functional mobility    Baseline  hip flex 4+, ext 3+, abd 3+; knee flex 4    Time  6    Status  New      PT LONG TERM GOAL #2   Title  Pt will be able to walk >/= 4 blocks with </= 2/10 in order to walk to the bus stop    Baseline  pt unable to walk > 2 blocks without pain    Period  Weeks      PT LONG TERM GOAL #3   Title  Pt will report </= 2/10 pain in RLE during lumbar AROM to improve quality of life    Time  6    Period  Weeks    Status  New  Plan - 06/26/18 1048    Clinical Impression Statement  Pt tolerating IASTM to left lumbar paraspinals, L glutes, L IT band and L lateral hip musculature. pt reporting 2/10 upon arrival at rest and no pain at rest at end of session. Pt reported following Manual therapy that it was easier to walk and felt she didn't need to use her crutches. Pt tolerating all exericses well.  Continue skilled PT to progress toward goals.     Rehab Potential  Good    PT Frequency  2x / week    PT Duration  6  weeks    PT Treatment/Interventions  ADLs/Self Care Home Management;Cryotherapy;Electrical Stimulation;Iontophoresis 4mg /ml Dexamethasone;Moist Heat;Gait training;Stair training;Functional mobility training;Therapeutic activities;Therapeutic exercise;Balance training;Neuromuscular re-education;Patient/family education;Manual techniques;Passive range of motion;Dry needling;Energy conservation;Taping    PT Next Visit Plan  hip and core strengthening, manual and modalities prn, gait training    PT Home Exercise Plan  hip flexor stretch, pelvic tilt, seated thoracic rotation, hip abduction in s/l    Consulted and Agree with Plan of Care  Patient       Patient will benefit from skilled therapeutic intervention in order to improve the following deficits and impairments:  Abnormal gait, Impaired sensation, Improper body mechanics, Pain, Decreased mobility, Increased muscle spasms, Postural dysfunction, Decreased activity tolerance, Decreased endurance, Decreased range of motion, Decreased strength, Hypomobility, Decreased balance, Difficulty walking  Visit Diagnosis: Pain in left leg  Other abnormalities of gait and mobility  Abnormal posture     Problem List Patient Active Problem List   Diagnosis Date Noted  . Bipolar mixed affective disorder, moderate ([place]) 11/23/2016  . Chest pain 01/20/2016  . Dyspnea 10/13/2012  . Tobacco abuse 07/06/2012  . Palpitations   . Psychosis ([place])   . Congestive heart failure ([place])   . BIPOLAR DISORDER 12/29/2006  . BACK PAIN, LOW 12/29/2006    Oretha Caprice, MPT 06/26/2018, 11:03 AM  [facility] [address] [place], [place], [address] Phone: [phone]   Fax:  [phone]  Name: CURTISTINE PETTITT MRN: [id number] Date of Birth: 1971/02/21

## 2018-07-04 ENCOUNTER — Encounter: Payer: Self-pay | Admitting: Physical Therapy

## 2018-07-04 ENCOUNTER — Ambulatory Visit: Payer: Medicaid Other | Attending: Orthopaedic Surgery | Admitting: Physical Therapy

## 2018-07-04 DIAGNOSIS — R2689 Other abnormalities of gait and mobility: Secondary | ICD-10-CM

## 2018-07-04 DIAGNOSIS — M79605 Pain in left leg: Secondary | ICD-10-CM | POA: Insufficient documentation

## 2018-07-04 DIAGNOSIS — R293 Abnormal posture: Secondary | ICD-10-CM | POA: Diagnosis present

## 2018-07-04 DIAGNOSIS — M79604 Pain in right leg: Secondary | ICD-10-CM | POA: Insufficient documentation

## 2018-07-04 NOTE — Patient Instructions (Addendum)
Coping with Quitting Smoking Quitting smoking is a physical and mental challenge. You will face cravings, withdrawal symptoms, and temptation. Before quitting, work with your health care provider to make a plan that can help you cope. Preparation can help you quit and keep you from giving in. How can I cope with cravings? Cravings usually last for 5-10 minutes. If you get through it, the craving will pass. Consider taking the following actions to help you cope with cravings:  Keep your mouth busy: ? Chew sugar-free gum. ? Suck on hard candies or a straw. ? Brush your teeth.  Keep your hands and body busy: ? Immediately change to a different activity when you feel a craving. ? Squeeze or play with a ball. ? Do an activity or a hobby, like making bead jewelry, practicing needlepoint, or working with wood. ? Mix up your normal routine. ? Take a short exercise break. Go for a quick walk or run up and down stairs. ? Spend time in public places where smoking is not allowed.  Focus on doing something kind or helpful for someone else.  Call a friend or family member to talk during a craving.  Join a support group.  Call a quit line, such as 1-800-QUIT-NOW.  Talk with your health care provider about medicines that might help you cope with cravings and make quitting easier for you.  How can I deal with withdrawal symptoms? Your body may experience negative effects as it tries to get used to not having nicotine in the system. These effects are called withdrawal symptoms. They may include:  Feeling hungrier than normal.  Trouble concentrating.  Irritability.  Trouble sleeping.  Feeling depressed.  Restlessness and agitation.  Craving a cigarette.  To manage withdrawal symptoms:  Avoid places, people, and activities that trigger your cravings.  Remember why you want to quit.  Get plenty of sleep.  Avoid coffee and other caffeinated drinks. These may worsen some of your  symptoms.  How can I handle social situations? Social situations can be difficult when you are quitting smoking, especially in the first few weeks. To manage this, you can:  Avoid parties, bars, and other social situations where people might be smoking.  Avoid alcohol.  Leave right away if you have the urge to smoke.  Explain to your family and friends that you are quitting smoking. Ask for understanding and support.  Plan activities with friends or family where smoking is not an option.  What are some ways I can cope with stress? Wanting to smoke may cause stress, and stress can make you want to smoke. Find ways to manage your stress. Relaxation techniques can help. For example:  Breathe slowly and deeply, in through your nose and out through your mouth.  Listen to soothing, relaxing music.  Talk with a family member or friend about your stress.  Light a candle.  Soak in a bath or take a shower.  Think about a peaceful place.  What are some ways I can prevent weight gain? Be aware that many people gain weight after they quit smoking. However, not everyone does. To keep from gaining weight, have a plan in place before you quit and stick to the plan after you quit. Your plan should include:  Having healthy snacks. When you have a craving, it may help to: ? Eat plain popcorn, crunchy carrots, celery, or other cut vegetables. ? Chew sugar-free gum.  Changing how you eat: ? Eat small portion sizes at meals. ?   Eat 4-6 small meals throughout the day instead of 1-2 large meals a day. ? Be mindful when you eat. Do not watch television or do other things that might distract you as you eat.  Exercising regularly: ? Make time to exercise each day. If you do not have time for a long workout, do short bouts of exercise for 5-10 minutes several times a day. ? Do some form of strengthening exercise, like weight lifting, and some form of aerobic exercise, like running or  swimming.  Drinking plenty of water or other low-calorie or no-calorie drinks. Drink 6-8 glasses of water daily, or as much as instructed by your health care provider.  Summary  Quitting smoking is a physical and mental challenge. You will face cravings, withdrawal symptoms, and temptation to smoke again. Preparation can help you as you go through these challenges.  You can cope with cravings by keeping your mouth busy (such as by chewing gum), keeping your body and hands busy, and making calls to family, friends, or a helpline for people who want to quit smoking.  You can cope with withdrawal symptoms by avoiding places where people smoke, avoiding drinks with caffeine, and getting plenty of rest.  Ask your health care provider about the different ways to prevent weight gain, avoid stress, and handle social situations. This information is not intended to replace advice given to you by your health care provider. Make sure you discuss any questions you have with your health care provider. Document Released: 10/15/2016 Document Revised: 10/15/2016 Document Reviewed: 10/15/2016 Elsevier Interactive Patient Education  2018 [place]    With other leg bent, foot flat, grasp right leg and slowly try to straighten knee. Hold _30___ seconds. Repeat __3__ times. Do _1___ sessions per day.  May use a sheet.    http://gt2.exer.us/280   Copyright  VHI. All rights reserved. Calf Stretch   30 Place one leg forward, bent, other leg behind and straight. Lean forward keeping back heel flat. Hold ___30_ seconds while counting out loud. Repeat with other leg forward. Repeat __3__ times. Do __1__ sessions per day.  http://gt2.exer.us/478   Copyright  VHI. All rights reserved.

## 2018-07-04 NOTE — Therapy (Signed)
[place], [place], [address] Phone: [phone]   Fax:  [phone]  Physical Therapy Treatment  Patient Details  Name: Anita Hill MRN: [id number] Date of Birth: 27-Nov-1970 Referring Provider: Frankey Shown, MD   Encounter Date: 07/04/2018  PT End of Session - 07/04/18 1207    Visit Number  3    Number of Visits  13    Date for PT Re-Evaluation  07/26/18    PT Start Time  5726    PT Stop Time  1150    PT Time Calculation (min)  45 min    Activity Tolerance  Patient tolerated treatment well    Behavior During Therapy  [facility] for tasks assessed/performed       Past Medical History:  Diagnosis Date  . BIPOLAR DISORDER   . Delusions ([place])   . Homelessness   . Left ventricular hypertrophy   . Palpitations   . Psychosis ([place])   . Sickle cell trait ([place])   . Substance abuse [facility])     Past Surgical History:  Procedure Laterality Date  . BUNIONECTOMY      There were no vitals filed for this visit.  Subjective Assessment - 07/04/18 1107    Subjective  pain is 10/10  it is worse at night. .  I notice both feet dragging when I walk.  I am looking for a good pair of shoes.     Currently in Pain?  Yes    Pain Score  10-Worst pain ever    Pain Location  Leg    Pain Orientation  Left;Anterior;Posterior;Medial   like a sock but not in the foot   Pain Descriptors / Indicators  Tingling;Burning;Aching;Numbness    Pain Type  Acute pain    Pain Radiating Towards  No     Aggravating Factors   gets numb to burn if i don't get off it.      Pain Relieving Factors  hot or cold towel,  run it under water,  off and on all night.  Sitting,  avoids moving around,  Flexoril.                       [facility] Adult PT Treatment/Exercise - 07/04/18 0001      Self-Care   Self-Care  Other Self-Care Comments    Other Self-Care Comments   smoking sessation info at patient's request      Knee/Hip  Exercises: Stretches   Active Hamstring Stretch  3 reps;30 seconds   HEP,  likes sheet   ITB Stretch  1 rep;30 seconds    Piriformis Stretch  2 reps;30 seconds    Gastroc Stretch Limitations  added to HEP    Other Knee/Hip Stretches  figure 4 stretch 1 X 10 seconds left,  non tight/    Other Knee/Hip Stretches  knee to chest 3 reps 20 seconds right stiffere than left       Knee/Hip Exercises: Supine   Bridges  10 reps   increased burning  left leg     Knee/Hip Exercises: Sidelying   Hip ABduction  10 reps    Clams  10   cued neutral     Cryotherapy   Cryotherapy Location  Knee   and shin,  intrmittant during session to decrease burning   Type of Cryotherapy  --   cold pack,  concurrent with exercise.  PT Education - 07/04/18 1126    Education Details  Self care,  HEP    Person(s) Educated  Patient    Methods  Explanation;Tactile cues;Verbal cues;Demonstration;Handout    Comprehension  Verbalized understanding;Returned demonstration       PT Short Term Goals - 06/26/18 1056      PT SHORT TERM GOAL #1   Title  Pt will be I with HEP    Baseline  HEP issued today    Time  3    Status  On-going      PT SHORT TERM GOAL #2   Title  Pt will demonstrate/verbalize proper posture during sitting and standing to reduce pain to </=2/10 and improve function    Baseline  Pt with forward head and rounded shoulders    Period  Weeks        PT Long Term Goals - 06/26/18 1057      PT LONG TERM GOAL #1   Title  Pt will improve LLE strength to >/=4+ with </= 2/10 pain to improve functional mobility    Baseline  hip flex 4+, ext 3+, abd 3+; knee flex 4    Time  6    Status  New      PT LONG TERM GOAL #2   Title  Pt will be able to walk >/= 4 blocks with </= 2/10 in order to walk to the bus stop    Baseline  pt unable to walk > 2 blocks without pain    Period  Weeks      PT LONG TERM GOAL #3   Title  Pt will report </= 2/10 pain in RLE during lumbar AROM to  improve quality of life    Time  6    Period  Weeks    Status  New            Plan - 07/04/18 1208    Clinical Impression Statement  Patient is not using crutches.  Gait improved , with decreased antalgia, with YOGA walking.  Supportive shoes will be a benifit when she finds a pair.      PT Next Visit Plan  hip and core strengthening, manual and modalities prn, gait training    PT Home Exercise Plan  hip flexor stretch, pelvic tilt, seated thoracic rotation, hip abduction in s/l, calf stretch, Hamstring stretch.    Consulted and Agree with Plan of Care  Patient       Patient will benefit from skilled therapeutic intervention in order to improve the following deficits and impairments:     Visit Diagnosis: Pain in left leg  Other abnormalities of gait and mobility  Abnormal posture  Pain in right leg     Problem List Patient Active Problem List   Diagnosis Date Noted  . Bipolar mixed affective disorder, moderate ([place]) 11/23/2016  . Chest pain 01/20/2016  . Dyspnea 10/13/2012  . Tobacco abuse 07/06/2012  . Palpitations   . Psychosis ([place])   . Congestive heart failure ([place])   . BIPOLAR DISORDER 12/29/2006  . BACK PAIN, LOW 12/29/2006    Alezander Dimaano PTA 07/04/2018, 12:11 PM  [facility] [address] [place], [place], [address] Phone: [phone]   Fax:  [phone]  Name: Anita Hill MRN: [id number] Date of Birth: 26-Aug-1971

## 2018-07-11 ENCOUNTER — Ambulatory Visit: Payer: Medicaid Other | Admitting: Physical Therapy

## 2018-07-11 ENCOUNTER — Telehealth: Payer: Self-pay | Admitting: Physical Therapy

## 2018-07-11 NOTE — Telephone Encounter (Signed)
LVM regarding missed appointment today. Referenced no show policy and discussed when her next appointment is and if she is unable to attend to call and cancel / reschedule to prevent it from counting against her.

## 2018-07-20 ENCOUNTER — Telehealth: Payer: Self-pay | Admitting: *Deleted

## 2018-07-20 ENCOUNTER — Ambulatory Visit: Payer: Medicaid Other | Admitting: Neurology

## 2018-07-20 NOTE — Telephone Encounter (Signed)
No showed new patient appointment. 

## 2018-07-21 ENCOUNTER — Encounter: Payer: Self-pay | Admitting: Neurology

## 2018-07-25 ENCOUNTER — Ambulatory Visit: Payer: Medicaid Other | Admitting: Physical Therapy

## 2018-07-25 ENCOUNTER — Telehealth: Payer: Self-pay | Admitting: Physical Therapy

## 2018-07-25 NOTE — Telephone Encounter (Signed)
LVM regarding today missing today's visit and it being her second missed appointment that per our policy we are required to cancel her future appointments except for her next appointment. If she missed her next appointment it will make 3 no shows which is grounds for discharge. Hope she is doing well. If she can't make the next appointment to call and cancel or reschedule otherwise we will plan to see her on 9/26 at 10:15.

## 2018-07-27 ENCOUNTER — Ambulatory Visit: Payer: Medicaid Other | Admitting: Physical Therapy

## 2018-07-27 ENCOUNTER — Encounter: Payer: Self-pay | Admitting: Physical Therapy

## 2018-07-27 DIAGNOSIS — M79604 Pain in right leg: Secondary | ICD-10-CM

## 2018-07-27 DIAGNOSIS — R293 Abnormal posture: Secondary | ICD-10-CM

## 2018-07-27 DIAGNOSIS — R2689 Other abnormalities of gait and mobility: Secondary | ICD-10-CM

## 2018-07-27 DIAGNOSIS — M79605 Pain in left leg: Secondary | ICD-10-CM

## 2018-07-27 NOTE — Therapy (Addendum)
[place], [place], [address] Phone: [phone]   Fax:  [phone]  Physical Therapy Treatment / Re-evaluation   Patient Details  Name: Anita Hill MRN: [id number] Date of Birth: 1971-10-17 Referring Provider: Frankey Shown, MD   Encounter Date: 07/27/2018  PT End of Session - 07/27/18 1048    Visit Number  4    Number of Visits  13    Date for PT Re-Evaluation  07/26/18    Authorization Type  MCD     PT Start Time  1015    PT Stop Time  1055    PT Time Calculation (min)  40 min    Activity Tolerance  Patient tolerated treatment well    Behavior During Therapy  [facility] for tasks assessed/performed       Past Medical History:  Diagnosis Date  . BIPOLAR DISORDER   . Delusions ([place])   . Homelessness   . Left ventricular hypertrophy   . Palpitations   . Psychosis ([place])   . Sickle cell trait ([place])   . Substance abuse [facility])     Past Surgical History:  Procedure Laterality Date  . BUNIONECTOMY      There were no vitals filed for this visit.  Subjective Assessment - 07/27/18 1022    Subjective  "the leg is still burning and going numb, the medication hadn't really helped"    Patient Stated Goals  better understanding of what is happening to her legs    Currently in Pain?  Yes    Pain Score  2     Pain Location  Leg    Pain Descriptors / Indicators  Burning    Pain Type  Acute pain    Pain Radiating Towards  in the shin along the front on the L     Pain Onset  More than a month ago    Pain Frequency  Constant    Aggravating Factors   N/A    Pain Relieving Factors  medication         [facility] PT Assessment - 07/27/18 0001      Assessment   Referring Provider  Frankey Shown, MD      AROM   Lumbar Flexion  60   abberrant movement to the L when returning to neutral   Lumbar Extension  18    Lumbar - Right Side Bend  18    Lumbar - Left Side Bend  25      Strength   Right Hip Flexion   4+/5    Right Hip Extension  3+/5    Right Hip ABduction  4+/5    Left Hip Flexion  4+/5    Left Hip Extension  3+/5    Left Hip ABduction  3+/5    Right Knee Flexion  4/5    Right Knee Extension  4+/5    Left Knee Flexion  4/5    Left Knee Extension  5/5                   [facility] Adult PT Treatment/Exercise - 07/27/18 0001      Exercises   Exercises  Knee/Hip;Lumbar      Lumbar Exercises: Seated   Other Seated Lumbar Exercises  posterior pelvic tilt with ADIM 2 x 10 holding 5 sec, avoiding excessive antrior tilt      Manual Therapy   Manual Therapy  Joint mobilization    Manual therapy comments  MTPR  along bil lumbar paraspinals    Joint Mobilization  R unilateral L1-L5 grade 3 mobs             PT Education - 07/27/18 1057    Education Details  reviewed previously provided HEP, and updated for pelvic tilt.     Person(s) Educated  Patient    Methods  Explanation;Verbal cues;Handout    Comprehension  Verbalized understanding;Verbal cues required       PT Short Term Goals - 07/27/18 1058      PT SHORT TERM GOAL #1   Title  Pt will be I with HEP    Time  3    Period  Weeks    Status  Achieved      PT SHORT TERM GOAL #2   Title  Pt will demonstrate/verbalize proper posture during sitting and standing to reduce pain to </=2/10 and improve function    Time  3    Period  Weeks    Status  Achieved        PT Long Term Goals - 07/27/18 1059      PT LONG TERM GOAL #1   Title  Pt will improve LLE strength to >/=4+ with </= 2/10 pain to improve functional mobility    Baseline  hip flex 4+, ext 3+, abd 3+; knee flex 4    Time  6    Period  Weeks    Status  On-going    Target Date  08/24/18      PT LONG TERM GOAL #2   Title  Pt will be able to walk >/= 4 blocks with </= 2/10 in order to walk to the bus stop    Baseline  pt unable to walk > 2 blocks without increased to 4/10    Time  4    Period  Weeks    Status  On-going    Target Date  08/24/18       PT LONG TERM GOAL #3   Title  Pt will report </= 2/10 pain in RLE during lumbar AROM to improve quality of life    Baseline  increase RLE pain during lumabr movement rated at 4/10 with end range movement    Time  6    Period  Weeks    Status  On-going    Target Date  08/24/18      PT LONG TERM GOAL #4   Title  Pt will be I with HEP by final visit to ensure progress beyond physical therapy discharge    Baseline  independent with current HEP and progressing as able    Time  4    Period  Weeks    Status  On-going    Target Date  08/24/18            Plan - 07/27/18 1127    Clinical Impression Statement  Mrs. Underhill reports improvement  with decreased pain at 2/10 pain today (previous session 10/10), and her limp has improved but continues to report N/T in the L4/L5 dermatomes on the L. She met all short term goals and reports she is motivated to continue with therapy to make progress. utilized manual techniques to reduce L lumbar paraspinal tightness and pelvic tilts to work on posture. she would benefit from physcial therapy to reduce pain and N/T, increase strength and maximize function by addressing the deficits listed.     Rehab Potential  Good    PT Frequency  2x / week  PT Duration  4 weeks    PT Treatment/Interventions  ADLs/Self Care Home Management;Cryotherapy;Electrical Stimulation;Iontophoresis 30m/ml Dexamethasone;Moist Heat;Gait training;Stair training;Functional mobility training;Therapeutic activities;Therapeutic exercise;Balance training;Neuromuscular re-education;Patient/family education;Manual techniques;Passive range of motion;Dry needling;Energy conservation;Taping    PT Next Visit Plan  hip and core strengthening, manual and modalities prn, gait training    PT Home Exercise Plan  hip flexor stretch, pelvic tilt, seated thoracic rotation, hip abduction in s/l, calf stretch, Hamstring stretch. seated pelvic tilt    Consulted and Agree with Plan of Care  Patient        UPDATED AS OF 08/10/2018   - Patient did arrive at a later time due to following appointments of an old schedule that were previously cancelled. Previous future visits were cancelled per clinic policy, but due to scheduling conflict/ misunderstanding patient was provided a new schedule with different appointment dates, but was still following her previous schedule causing her to arrive on wrong dates or late. Her future appointments were cancelled again but due to scheduling misunderstanding she was not discharged and was rescheduled and provided an updated handout and instructed to dispose of old schedule handouts and only follow what was given today.       Patient will benefit from skilled therapeutic intervention in order to improve the following deficits and impairments:  Abnormal gait, Impaired sensation, Improper body mechanics, Pain, Decreased mobility, Increased muscle spasms, Postural dysfunction, Decreased activity tolerance, Decreased endurance, Decreased range of motion, Decreased strength, Hypomobility, Decreased balance, Difficulty walking  Visit Diagnosis: Other abnormalities of gait and mobility  Abnormal posture  Pain in right leg  Pain in left leg     Problem List Patient Active Problem List   Diagnosis Date Noted  . Bipolar mixed affective disorder, moderate ([place] 11/23/2016  . Chest pain 01/20/2016  . Dyspnea 10/13/2012  . Tobacco abuse 07/06/2012  . Palpitations   . Psychosis ([place]   . Congestive heart failure ([place]   . BIPOLAR DISORDER 12/29/2006  . BACK PAIN, LOW 12/29/2006   KStarr LakePT, DPT, LAT, ATC  07/27/18  11:31 AM      [facility] [address]Phone: [phone]  Fax:  [phone] Name: AMARIGNY BORREMRN: [id number] of Birth: [birth year]    KStarr LakePT, DPT, LAT, ATC  08/10/18  11:17 AM

## 2018-08-01 ENCOUNTER — Ambulatory Visit: Payer: Medicaid Other | Admitting: Physical Therapy

## 2018-08-01 ENCOUNTER — Ambulatory Visit (INDEPENDENT_AMBULATORY_CARE_PROVIDER_SITE_OTHER): Payer: Medicaid Other | Admitting: Neurology

## 2018-08-01 ENCOUNTER — Telehealth: Payer: Self-pay | Admitting: *Deleted

## 2018-08-01 DIAGNOSIS — M545 Low back pain: Secondary | ICD-10-CM

## 2018-08-01 NOTE — Telephone Encounter (Signed)
Patient arrived late to her appt and did not have her co-pay.  She told our check-in desk that she was going to go to her car for the co-pay and never returned to our office.  Second no show for a new patient appt.

## 2018-08-02 ENCOUNTER — Telehealth: Payer: Self-pay | Admitting: Neurology

## 2018-08-02 NOTE — Telephone Encounter (Signed)
FYI- patient is a new patient and has had 2 no-shows in 2019.

## 2018-08-03 ENCOUNTER — Encounter: Payer: Self-pay | Admitting: Physical Therapy

## 2018-08-07 NOTE — Progress Notes (Signed)
NO SHOW

## 2018-08-08 ENCOUNTER — Ambulatory Visit: Payer: Medicaid Other | Attending: Orthopaedic Surgery | Admitting: Physical Therapy

## 2018-08-08 ENCOUNTER — Encounter: Payer: Self-pay | Admitting: Physical Therapy

## 2018-08-08 DIAGNOSIS — M79604 Pain in right leg: Secondary | ICD-10-CM | POA: Insufficient documentation

## 2018-08-08 DIAGNOSIS — R293 Abnormal posture: Secondary | ICD-10-CM | POA: Insufficient documentation

## 2018-08-08 DIAGNOSIS — R2689 Other abnormalities of gait and mobility: Secondary | ICD-10-CM | POA: Insufficient documentation

## 2018-08-08 DIAGNOSIS — M79605 Pain in left leg: Secondary | ICD-10-CM | POA: Insufficient documentation

## 2018-08-10 ENCOUNTER — Encounter: Payer: Self-pay | Admitting: Physical Therapy

## 2018-08-10 ENCOUNTER — Ambulatory Visit: Payer: Medicaid Other | Admitting: Physical Therapy

## 2018-08-10 ENCOUNTER — Encounter: Payer: Self-pay | Admitting: Neurology

## 2018-08-10 ENCOUNTER — Telehealth: Payer: Self-pay | Admitting: Physical Therapy

## 2018-08-10 NOTE — Telephone Encounter (Signed)
Patient arrived in our clinic today about 10:30 (not having yet heard the VM left by Vania Rea). When she checked-in at the front desk, she was informed she had missed her 8 am appointment today. The patient stated she had an 11 am appointment and believed we had made a mistake on our end. Almyra Free (front office) came to me for help reporting that the patient wanted to talk with someone. Pt reported to me that in September we had called to state she had no showed an appointment and this was not true because she reports she had called to cancel. She also reports she had arrived last week for an appointment and been told she didn't have one. She had today's down as 11 am. In listening to her, it appeared that she had been working off an old version of her schedule and not the newest copy. She states she was unaware of her appointment on 10/8 (Tuesday). Upon my investigation, I determined that we did not have insurance auth for either of her visits this week and we did not call her to follow-up on her missed appoint Tuesday. Patient reported she was committed to therapy and agreed to make new appointments, discard the old schedules, and resume therapy. She was counseled on and verbalized understanding that if she was not able to meet our attendance requirements, we would need to discharge services. Patient also inquired about a Podiatrist in the area and was provided information on [facility]. While she was in my office, we called to ensure they accepted her insurance. Walked patient out and told her we look forward to seeing her at her appointment next Thursday.   Romualdo Bolk, PT, DPT 08/10/18 11:31 AM Phone: [phone] Fax: [phone]

## 2018-08-10 NOTE — Telephone Encounter (Signed)
LVM regarding that today was her 4th missed appointment, and that per clinic policy following 3 missed appointments is grounds for discharge and that we are going to discharge her from PT today. Hopefully she is doing okay, but due to lack of attendance that perhaps physical therapy isn't appropriate for her at this point in time. If she has any questions she can call us back.

## 2018-08-11 ENCOUNTER — Ambulatory Visit (INDEPENDENT_AMBULATORY_CARE_PROVIDER_SITE_OTHER): Payer: Medicaid Other

## 2018-08-11 ENCOUNTER — Ambulatory Visit: Payer: Medicaid Other | Admitting: Podiatry

## 2018-08-11 ENCOUNTER — Other Ambulatory Visit: Payer: Self-pay | Admitting: Podiatry

## 2018-08-11 ENCOUNTER — Encounter: Payer: Self-pay | Admitting: Podiatry

## 2018-08-11 VITALS — BP 122/90 | HR 68

## 2018-08-11 DIAGNOSIS — M2042 Other hammer toe(s) (acquired), left foot: Principal | ICD-10-CM

## 2018-08-11 DIAGNOSIS — M778 Other enthesopathies, not elsewhere classified: Secondary | ICD-10-CM

## 2018-08-11 DIAGNOSIS — M7752 Other enthesopathy of left foot: Secondary | ICD-10-CM | POA: Diagnosis not present

## 2018-08-11 DIAGNOSIS — M79671 Pain in right foot: Secondary | ICD-10-CM | POA: Diagnosis not present

## 2018-08-11 DIAGNOSIS — M2041 Other hammer toe(s) (acquired), right foot: Secondary | ICD-10-CM | POA: Diagnosis not present

## 2018-08-11 DIAGNOSIS — M7751 Other enthesopathy of right foot: Secondary | ICD-10-CM | POA: Diagnosis not present

## 2018-08-11 DIAGNOSIS — M79672 Pain in left foot: Secondary | ICD-10-CM | POA: Diagnosis not present

## 2018-08-11 DIAGNOSIS — M779 Enthesopathy, unspecified: Secondary | ICD-10-CM

## 2018-08-11 MED ORDER — TRIAMCINOLONE ACETONIDE 10 MG/ML IJ SUSP
10.0000 mg | Freq: Once | INTRAMUSCULAR | Status: AC
  Administered 2018-08-11: 10 mg

## 2018-08-11 NOTE — Progress Notes (Signed)
Subjective:   Patient ID: Anita Hill, female   DOB: 47 y.o.   MRN: [id number]   HPI Patient presents stating that I have had trouble between my fourth and fifth toes of both feet and its been getting worse over the last month.  She states she is had a corn formation that is becoming increasingly tender and making it hard to wear shoes and she is had previous bunion surgery has tried wider shoes and padding patient does smoke approximately pack per day and would like to be more active   Review of Systems  All other systems reviewed and are negative.       Objective:  Physical Exam  Constitutional: She appears well-developed and well-nourished.  Cardiovascular: Intact distal pulses.  Pulmonary/Chest: Effort normal.  Musculoskeletal: Normal range of motion.  Neurological: She is alert.  Skin: Skin is warm.  Nursing note and vitals reviewed.   Neurovascular status found to be intact muscle strength is found to be adequate range of motion within normal limits.  I did note patient to have negative Homans sign and was found to have severe keratotic lesion fourth digit bilateral with inner phalangeal joint fluid buildup and pain with palpation.  Rotation of the fifth digits bilateral is noted against the fourth toe and patient did have good digital perfusion     Assessment:  Hammertoe deformity digits 5 4 both feet with inflammatory capsulitis fourth digit bilateral     Plan:  H&P conditions reviewed and today I did careful inner phalangeal joint injections of the inner phalangeal joint with 1 mg dexamethasone 1 mg Kenalog and debrided the lesions applied padding to take pressure off and discussed possible arthroplasty in future.  Reappoint as symptoms indicate  X-rays indicate there is significant rotation of the fifth digit bilateral against the fourth toes bilateral

## 2018-08-15 ENCOUNTER — Ambulatory Visit: Payer: Medicaid Other | Admitting: Physical Therapy

## 2018-08-15 ENCOUNTER — Encounter: Payer: Self-pay | Admitting: Physical Therapy

## 2018-08-17 ENCOUNTER — Other Ambulatory Visit: Payer: Self-pay

## 2018-08-17 ENCOUNTER — Encounter: Payer: Self-pay | Admitting: Physical Therapy

## 2018-08-17 ENCOUNTER — Ambulatory Visit: Payer: Medicaid Other | Admitting: Physical Therapy

## 2018-08-17 ENCOUNTER — Emergency Department (HOSPITAL_COMMUNITY)
Admission: EM | Admit: 2018-08-17 | Discharge: 2018-08-17 | Disposition: A | Discharge status: Home / Self Care | Payer: Medicaid Other | Attending: Emergency Medicine | Admitting: Emergency Medicine

## 2018-08-17 ENCOUNTER — Encounter (HOSPITAL_COMMUNITY): Payer: Self-pay | Admitting: Emergency Medicine

## 2018-08-17 ENCOUNTER — Emergency Department (HOSPITAL_COMMUNITY): Payer: Medicaid Other

## 2018-08-17 DIAGNOSIS — S0990XA Unspecified injury of head, initial encounter: Secondary | ICD-10-CM

## 2018-08-17 DIAGNOSIS — I509 Heart failure, unspecified: Secondary | ICD-10-CM | POA: Insufficient documentation

## 2018-08-17 DIAGNOSIS — M79605 Pain in left leg: Secondary | ICD-10-CM | POA: Diagnosis present

## 2018-08-17 DIAGNOSIS — Z23 Encounter for immunization: Secondary | ICD-10-CM | POA: Insufficient documentation

## 2018-08-17 DIAGNOSIS — R2689 Other abnormalities of gait and mobility: Secondary | ICD-10-CM | POA: Diagnosis not present

## 2018-08-17 DIAGNOSIS — Y9289 Other specified places as the place of occurrence of the external cause: Secondary | ICD-10-CM | POA: Diagnosis not present

## 2018-08-17 DIAGNOSIS — Z79899 Other long term (current) drug therapy: Secondary | ICD-10-CM | POA: Diagnosis not present

## 2018-08-17 DIAGNOSIS — Y999 Unspecified external cause status: Secondary | ICD-10-CM | POA: Insufficient documentation

## 2018-08-17 DIAGNOSIS — F17290 Nicotine dependence, other tobacco product, uncomplicated: Secondary | ICD-10-CM | POA: Diagnosis not present

## 2018-08-17 DIAGNOSIS — M79604 Pain in right leg: Secondary | ICD-10-CM

## 2018-08-17 DIAGNOSIS — S0181XA Laceration without foreign body of other part of head, initial encounter: Secondary | ICD-10-CM

## 2018-08-17 DIAGNOSIS — Y939 Activity, unspecified: Secondary | ICD-10-CM | POA: Diagnosis not present

## 2018-08-17 DIAGNOSIS — R293 Abnormal posture: Secondary | ICD-10-CM | POA: Diagnosis present

## 2018-08-17 LAB — POC URINE PREG, ED: Preg Test, Ur: NEGATIVE

## 2018-08-17 MED ORDER — TETANUS-DIPHTH-ACELL PERTUSSIS 5-2.5-18.5 LF-MCG/0.5 IM SUSP
0.5000 mL | Freq: Once | INTRAMUSCULAR | Status: AC
  Administered 2018-08-17: 0.5 mL via INTRAMUSCULAR
  Filled 2018-08-17: qty 0.5

## 2018-08-17 MED ORDER — LIDOCAINE-EPINEPHRINE 2 %-1:100000 IJ SOLN: 20.0000 mL | Freq: Once | INTRAMUSCULAR | Status: DC

## 2018-08-17 MED ORDER — LIDOCAINE-EPINEPHRINE (PF) 2 %-1:200000 IJ SOLN
20.0000 mL | Freq: Once | INTRAMUSCULAR | Status: AC
  Administered 2018-08-17: 20 mL via INTRADERMAL
  Filled 2018-08-17: qty 20

## 2018-08-17 MED ORDER — ACETAMINOPHEN 325 MG PO TABS
325.0000 mg | ORAL_TABLET | Freq: Once | ORAL | Status: AC
  Administered 2018-08-17: 325 mg via ORAL
  Filled 2018-08-17: qty 1
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
## 2018-08-17 NOTE — Discharge Instructions (Addendum)
The ct scan of your head did not show any bleeding in your brain or fractures in your skull.   The ct scan of your neck did not show any evidence of an acute broken bone.   Please follow-up for suture removal at either urgent care ,the emergency department, or your primary care doctor in 5 days.  Please return to the emergency room immediately if you experience any new or worsening symptoms or any symptoms that indicate worsening infection such as fevers, increased redness/swelling/pain, warmth, or drainage from the affected area.

## 2018-08-17 NOTE — Patient Instructions (Addendum)
Healthy Back - Cat Stretch    With hands and knees apart, and looking down at floor, arch your back downward as much as possible, then pull stomach in to make back rounded. Use smooth, continuous movements, with no jerking or straining. Do _5___ times. Increase repetitions gradually up to _10___.

## 2018-08-17 NOTE — Therapy (Addendum)
[place], [place], [address] Phone: [phone]   Fax:  [phone]  Physical Therapy Treatment / Discharge Summary  Patient Details  Name: Anita Hill MRN: [id number] Date of Birth: December 20, 1970 Referring Provider (PT): Frankey Shown, MD   Encounter Date: 08/17/2018  PT End of Session - 08/17/18 0940    Visit Number  5    Number of Visits  13    Date for PT Re-Evaluation  07/26/18    Authorization Type  MCD     PT Start Time  0850    PT Stop Time  0930    PT Time Calculation (min)  40 min    Activity Tolerance  Patient tolerated treatment well    Behavior During Therapy  [facility] for tasks assessed/performed       Past Medical History:  Diagnosis Date  . BIPOLAR DISORDER   . Delusions ([place])   . Homelessness   . Left ventricular hypertrophy   . Palpitations   . Psychosis ([place])   . Sickle cell trait ([place])   . Substance abuse [facility])     Past Surgical History:  Procedure Laterality Date  . BUNIONECTOMY      There were no vitals filed for this visit.  Subjective Assessment - 08/17/18 0851    Subjective  Has foot surgery Last week.  3/10  right  between toes. has been doing the exercises  until surgery.  Able to walk more than 2 blocks without pain.      Pertinent History  mitral valve problems  with chest pain  10/10        Aggravating Factors   walking,  slouching, sleeping wrong.      Pain Relieving Factors  not walking,, limiting activities                       [facility] Adult PT Treatment/Exercise - 08/17/18 0001      Self-Care   Other Self-Care Comments   Standing posturepracticed with mirror.  cued for tilt posterior and hold vs tilt and move shoulders posterior wiich flattens thoracic.       Lumbar Exercises: Stretches   Hip Flexor Stretch  1 rep;10 seconds    Hip Flexor Stretch Limitations  non tight  to bed      Lumbar Exercises: Seated   Other Seated Lumbar  Exercises  seated tilt      Lumbar Exercises: Supine   Bent Knee Raise  5 reps    Bent Knee Raise Limitations  No pain ,, good pelvic control    Bridge  10 reps    Bridge Limitations  with arch pain into leg , with tilt no pop, / pain      Lumbar Exercises: Sidelying   Clam  10 reps    Clam Limitations  pilates style  no pain  LT side down more cues needed for neutral back    Hip Abduction  10 reps    Other Sidelying Lumbar Exercises  book opener left side dowm   sharp pain 7/10 mid thoracic      Lumbar Exercises: Quadruped   Madcat/Old Horse  5 reps    Madcat/Old Horse Limitations  cued initially  good sag,  arch stif   stretching,  HEP            PT Education - 08/17/18 0347    Education Details  HEP,  posture    Person(s) Educated  Patient    Methods  Explanation;Demonstration;Tactile cues;Verbal cues;Handout    Comprehension  Verbalized understanding;Returned demonstration       PT Short Term Goals - 07/27/18 1058      PT SHORT TERM GOAL #1   Title  Pt will be I with HEP    Time  3    Period  Weeks    Status  Achieved      PT SHORT TERM GOAL #2   Title  Pt will demonstrate/verbalize proper posture during sitting and standing to reduce pain to </=2/10 and improve function    Time  3    Period  Weeks    Status  Achieved        PT Long Term Goals - 08/17/18 0944      PT LONG TERM GOAL #1   Title  Pt will improve LLE strength to >/=4+ with </= 2/10 pain to improve functional mobility    Time  6    Period  Weeks    Status  Unable to assess      PT LONG TERM GOAL #2   Title  Pt will be able to walk >/= 4 blocks with </= 2/10 in order to walk to the bus stop    Baseline  able to walk over 2 blocks to the laundrymat without pain increase X 1.( prior to foot surgery)    Time  4    Period  Weeks    Status  On-going      PT LONG TERM GOAL #3   Title  Pt will report </= 2/10 pain in RLE during lumbar AROM to improve quality of life    Baseline  NO pain  with standing AROM lumbar spine today.  Consistant    Time  6    Period  Weeks    Status  Partially Met      PT LONG TERM GOAL #4   Title  Pt will be I with HEP by final visit to ensure progress beyond physical therapy discharge    Baseline  independent with current HEP and progressing as able    Time  4    Period  Weeks    Status  On-going            Plan - 08/17/18 0940    Clinical Impression Statement  Patient was able to exercises with hip/core strengthening today .  She felt the same post session.  Progressed her HEP.  No new goals met. Recent foot surgery last week so did not work on gait.     Patient had no pain with standing AROM lumbar spine.   LTG#3 partially met.    PT Next Visit Plan  hip and core strengthening, manual and modalities prn, gait trainingConsider quadriped  strengthening    PT Home Exercise Plan  hip flexor stretch, pelvic tilt, seated thoracic rotation, hip abduction in s/l, calf stretch, Hamstring stretch. seated pelvic tilt,  cat stretch quadriped    Consulted and Agree with Plan of Care  Patient       Patient will benefit from skilled therapeutic intervention in order to improve the following deficits and impairments:     Visit Diagnosis: Other abnormalities of gait and mobility  Abnormal posture  Pain in right leg  Pain in left leg     Problem List Patient Active Problem List   Diagnosis Date Noted  . Bipolar mixed affective disorder, moderate ([place]) 11/23/2016  . Chest pain 01/20/2016  .  Dyspnea 10/13/2012  . Tobacco abuse 07/06/2012  . Palpitations   . Psychosis ([place])   . Congestive heart failure ([place])   . BIPOLAR DISORDER 12/29/2006  . BACK PAIN, LOW 12/29/2006    Murielle Stang  PTA 08/17/2018, 9:48 AM  [facility] [address] [place], [place], [address] Phone: [phone]   Fax:  [phone]  Name: Anita Hill MRN: [id number] Date of Birth:  1971/01/28       PHYSICAL THERAPY DISCHARGE SUMMARY  Visits from Start of Care: 5  Current functional level related to goals / functional outcomes: See goals   Remaining deficits: Unknown   Education / Equipment: HEP, posture edcuation  Plan: Patient agrees to discharge.  Patient goals were not met. Patient is being discharged due to not returning since the last visit.  ?????         Kristoffer Leamon PT, DPT, LAT, ATC  10/10/18  10:58 AM
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
## 2018-08-17 NOTE — ED Notes (Signed)
Patient transported to CT 

## 2018-08-22 ENCOUNTER — Encounter (HOSPITAL_COMMUNITY): Payer: Self-pay | Admitting: Emergency Medicine

## 2018-08-22 ENCOUNTER — Emergency Department (HOSPITAL_COMMUNITY)
Admission: EM | Admit: 2018-08-22 | Discharge: 2018-08-22 | Disposition: A | Discharge status: Home / Self Care | Payer: Medicaid Other | Attending: Emergency Medicine | Admitting: Emergency Medicine

## 2018-08-22 ENCOUNTER — Other Ambulatory Visit: Payer: Self-pay

## 2018-08-22 ENCOUNTER — Encounter: Payer: Self-pay | Admitting: Physical Therapy

## 2018-08-22 DIAGNOSIS — F17210 Nicotine dependence, cigarettes, uncomplicated: Secondary | ICD-10-CM | POA: Diagnosis not present

## 2018-08-22 DIAGNOSIS — R51 Headache: Secondary | ICD-10-CM | POA: Insufficient documentation

## 2018-08-22 DIAGNOSIS — Z79899 Other long term (current) drug therapy: Secondary | ICD-10-CM | POA: Diagnosis not present

## 2018-08-22 DIAGNOSIS — Y9389 Activity, other specified: Secondary | ICD-10-CM | POA: Diagnosis not present

## 2018-08-22 DIAGNOSIS — Y998 Other external cause status: Secondary | ICD-10-CM | POA: Insufficient documentation

## 2018-08-22 DIAGNOSIS — Z4802 Encounter for removal of sutures: Secondary | ICD-10-CM | POA: Diagnosis not present

## 2018-08-22 DIAGNOSIS — S060X0A Concussion without loss of consciousness, initial encounter: Secondary | ICD-10-CM | POA: Insufficient documentation

## 2018-08-22 DIAGNOSIS — W228XXD Striking against or struck by other objects, subsequent encounter: Secondary | ICD-10-CM | POA: Insufficient documentation

## 2018-08-22 DIAGNOSIS — Y929 Unspecified place or not applicable: Secondary | ICD-10-CM | POA: Diagnosis not present

## 2018-08-22 DIAGNOSIS — F17290 Nicotine dependence, other tobacco product, uncomplicated: Secondary | ICD-10-CM | POA: Insufficient documentation

## 2018-08-22 DIAGNOSIS — H538 Other visual disturbances: Secondary | ICD-10-CM | POA: Diagnosis not present

## 2018-08-22 DIAGNOSIS — S0990XD Unspecified injury of head, subsequent encounter: Secondary | ICD-10-CM | POA: Diagnosis present

## 2018-08-22 MED ORDER — IBUPROFEN 400 MG PO TABS
600.0000 mg | ORAL_TABLET | Freq: Once | ORAL | Status: AC
  Administered 2018-08-22: 600 mg via ORAL
  Filled 2018-08-22: qty 1

## 2018-08-22 MED ORDER — ACETAMINOPHEN 325 MG PO TABS
325.0000 mg | ORAL_TABLET | Freq: Once | ORAL | Status: AC
  Administered 2018-08-22: 325 mg via ORAL
  Filled 2018-08-22: qty 1
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
## 2018-08-22 NOTE — ED Notes (Signed)
Called pt name x3 for triage. No response from pt. Called name near restrooms as well.
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
## 2018-08-22 NOTE — ED Notes (Signed)
Patient verbalizes understanding of discharge instructions. Opportunity for questioning and answers were provided. Ambulatory at discharge in NAD.  

## 2018-08-22 NOTE — ED Notes (Signed)
Pt.keeps walking outside so when they call her she not in the building

## 2018-08-23 ENCOUNTER — Emergency Department (HOSPITAL_COMMUNITY)
Admission: EM | Admit: 2018-08-23 | Discharge: 2018-08-23 | Discharge status: Against Medical Advice | Payer: Medicaid Other
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
## 2018-08-24 ENCOUNTER — Encounter: Payer: Self-pay | Admitting: Physical Therapy

## 2018-08-24 ENCOUNTER — Telehealth: Payer: Self-pay | Admitting: Physical Therapy

## 2018-08-24 NOTE — Telephone Encounter (Signed)
Left message on voicemail about making a PT appointment tomorrow.  She needs to see a PT every 30 days.  If she attends tomorrow she can keep Monday's appointment. Our number was given for patient to call.  Melvenia Needles PTA

## 2018-08-28 ENCOUNTER — Other Ambulatory Visit: Payer: Self-pay

## 2018-08-28 ENCOUNTER — Emergency Department (HOSPITAL_COMMUNITY)
Admission: EM | Admit: 2018-08-28 | Discharge: 2018-08-28 | Disposition: A | Discharge status: Home / Self Care | Payer: Medicaid Other | Attending: Emergency Medicine | Admitting: Emergency Medicine

## 2018-08-28 ENCOUNTER — Ambulatory Visit: Payer: Medicaid Other | Admitting: Physical Therapy

## 2018-08-28 ENCOUNTER — Encounter (HOSPITAL_COMMUNITY): Payer: Self-pay | Admitting: Emergency Medicine

## 2018-08-28 DIAGNOSIS — F17210 Nicotine dependence, cigarettes, uncomplicated: Secondary | ICD-10-CM | POA: Insufficient documentation

## 2018-08-28 DIAGNOSIS — Y999 Unspecified external cause status: Secondary | ICD-10-CM | POA: Diagnosis not present

## 2018-08-28 DIAGNOSIS — Y939 Activity, unspecified: Secondary | ICD-10-CM | POA: Diagnosis not present

## 2018-08-28 DIAGNOSIS — X58XXXA Exposure to other specified factors, initial encounter: Secondary | ICD-10-CM | POA: Diagnosis not present

## 2018-08-28 DIAGNOSIS — Y929 Unspecified place or not applicable: Secondary | ICD-10-CM | POA: Diagnosis not present

## 2018-08-28 DIAGNOSIS — F0781 Postconcussional syndrome: Secondary | ICD-10-CM | POA: Insufficient documentation

## 2018-08-28 DIAGNOSIS — Z79899 Other long term (current) drug therapy: Secondary | ICD-10-CM | POA: Diagnosis not present

## 2018-08-28 DIAGNOSIS — S0181XA Laceration without foreign body of other part of head, initial encounter: Secondary | ICD-10-CM | POA: Insufficient documentation

## 2018-08-28 DIAGNOSIS — S0181XD Laceration without foreign body of other part of head, subsequent encounter: Secondary | ICD-10-CM

## 2018-08-28 NOTE — ED Triage Notes (Addendum)
Pt to triage via [place].  Request wound check.  Pt had sutures removed from L forehead after 5 days.  Reports headache and "knot" to suture site.  Requesting wound to be re-opened.

## 2018-08-28 NOTE — ED Provider Notes (Signed)
[facility] DEPARTMENT Provider Note   CSN: [id number] Arrival date & time: 08/28/18  0236     History   Chief Complaint Chief Complaint  Patient presents with  . Wound Check    HPI Anita Hill is a 47 y.o. female.  The history is provided by the patient.  She has history of bipolar disorder with psychosis, substance abuse and comes in complaining of ongoing headache and dizziness following head injury.  Also, she has noted some knots where she had a laceration repaired and wonders if it needs to be opened up and re-stitched.  She denies nausea or vomiting.  She is noticed no focal weakness or numbness.  Past Medical History:  Diagnosis Date  . BIPOLAR DISORDER   . Delusions ([place])   . Homelessness   . Left ventricular hypertrophy   . Palpitations   . Psychosis ([place])   . Sickle cell trait ([place])   . Substance abuse [facility])     Patient Active Problem List   Diagnosis Date Noted  . Bipolar mixed affective disorder, moderate ([place]) 11/23/2016  . Chest pain 01/20/2016  . Dyspnea 10/13/2012  . Tobacco abuse 07/06/2012  . Palpitations   . Psychosis ([place])   . Congestive heart failure ([place])   . BIPOLAR DISORDER 12/29/2006  . BACK PAIN, LOW 12/29/2006    Past Surgical History:  Procedure Laterality Date  . BUNIONECTOMY       OB History    Gravida  1   Para      Term      Preterm      AB      Living        SAB      TAB      Ectopic      Multiple      Live Births               Home Medications    Prior to Admission medications   Medication Sig Start Date End Date Taking? Authorizing Provider  acetaminophen (TYLENOL) 325 MG tablet Take 325 mg by mouth every 6 (six) hours as needed for headache.    [provider]  cyclobenzaprine (FLEXERIL) 10 MG tablet Take 1 tablet (10 mg total) by mouth 2 (two) times daily as needed for muscle spasms. 06/04/18   Recardo Evangelist, PA-C  ibuprofen (ADVIL,MOTRIN) 600 MG  tablet Take 600 mg by mouth every 6 (six) hours as needed for fever, headache or moderate pain.    [provider]  ibuprofen (ADVIL,MOTRIN) 800 MG tablet Take 1 tablet (800 mg total) by mouth every 8 (eight) hours as needed for mild pain or moderate pain. 05/19/18   Domenic Moras, PA-C  naproxen (NAPROSYN) 500 MG tablet Take 1 tablet (500 mg total) by mouth 2 (two) times daily. 04/12/18   Tanna Furry, MD  potassium chloride SA (K-DUR,KLOR-CON) 20 MEQ tablet Take 1 tablet (20 mEq total) by mouth 2 (two) times daily. 01/20/16   Lelon Perla, MD  Tetrahydrozoline HCl (VISINE EXTRA OP) Apply 2 drops to eye daily as needed (for watery eyes).    [provider]  triamcinolone cream (KENALOG) 0.1 % Apply 1 application topically 2 (two) times daily. 04/12/18   Tanna Furry, MD    Family History Family History  Problem Relation Age of Onset  . Heart disease Mother        CHF  . Prostate cancer Father     Social History  Social History   Tobacco Use  . Smoking status: Current Every Day Smoker    Packs/day: 1.00    Years: 10.00    Pack years: 10.00    Types: Cigarettes, Cigars  . Smokeless tobacco: Never Used  Substance Use Topics  . Alcohol use: Yes    Comment: everyday 20-80oz  . Drug use: No    Comment: Previous cocaine, heroin, marijuana     Allergies   Strawberry extract; Latuda [lurasidone hcl]; and Risperidone and related   Review of Systems Review of Systems  All other systems reviewed and are negative.    Physical Exam Updated Vital Signs BP 117/88 (BP Location: Right Arm)   Pulse 69   Temp 98 F (36.7 C) (Oral)   Resp 14   Ht 5\' 2"  (1.575 m)   Wt 61.2 kg   SpO2 100%   BMI 24.69 kg/m   Physical Exam  Nursing note and vitals reviewed.  47 year old female, resting comfortably and in no acute distress. Vital signs are normal. Oxygen saturation is 100%, which is normal. Head is normocephalic .  Laceration on the left side of the forehead is  healing well without signs of infection.  Normal amount of scar tissue is present which is what the patient is feeling when she describes bumps. PERRLA, EOMI. Oropharynx is clear. Neck is nontender and supple without adenopathy or JVD. Back is nontender and there is no CVA tenderness. Lungs are clear without rales, wheezes, or rhonchi. Chest is nontender. Heart has regular rate and rhythm without murmur. Abdomen is soft, flat, nontender without masses or hepatosplenomegaly and peristalsis is normoactive. Extremities have no cyanosis or edema, full range of motion is present. Skin is warm and dry without rash. Neurologic: Mental status is normal, cranial nerves are intact, there are no motor or sensory deficits.  ED Treatments / Results   Procedures Procedures   Medications Ordered in ED Medications - No data to display   Initial Impression / Assessment and Plan / ED Course  I have reviewed the triage vital signs and the nursing notes.  Post concussion syndrome.  Normal healing of laceration of the forehead.  Old records are reviewed confirming laceration which was repaired October 17, sutures removed October 22.  She had negative CT of the head on October 17.  At this point, no indications for reimaging.  Time frame of recovery from concussion was explained to patient.  Also, I have explained to the patient nature of scar tissue and out is necessary for wound healing.  She expresses understanding.  Final Clinical Impressions(s) / ED Diagnoses   Final diagnoses:  Post concussion syndrome  Laceration of skin of forehead, subsequent encounter    ED Discharge Orders    None       Delora Fuel, MD 17/79/39 [phone]

## 2018-08-28 NOTE — Discharge Instructions (Signed)
Continue routine wound care. The bumps may fade with time. However, they may never go away completely.  Your concussion symptoms may take several weeks, or even several months to get better.

## 2018-08-29 ENCOUNTER — Encounter: Payer: Self-pay | Admitting: Physical Therapy

## 2018-08-30 ENCOUNTER — Ambulatory Visit: Payer: Medicaid Other | Admitting: Physical Therapy

## 2018-08-31 ENCOUNTER — Encounter: Payer: Self-pay | Admitting: Physical Therapy

## 2018-09-04 ENCOUNTER — Ambulatory Visit: Payer: Medicaid Other | Admitting: Physical Therapy

## 2018-09-04 ENCOUNTER — Ambulatory Visit: Payer: Medicaid Other | Admitting: Podiatry

## 2018-09-06 ENCOUNTER — Telehealth: Payer: Self-pay | Admitting: Physical Therapy

## 2018-09-06 ENCOUNTER — Ambulatory Visit: Payer: Medicaid Other | Attending: Orthopaedic Surgery | Admitting: Physical Therapy
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
## 2018-09-11 ENCOUNTER — Ambulatory Visit: Payer: Medicaid Other | Admitting: Physical Therapy

## 2018-09-12 ENCOUNTER — Ambulatory Visit: Payer: Medicaid Other | Admitting: Physical Therapy

## 2018-12-10 ENCOUNTER — Other Ambulatory Visit: Payer: Self-pay

## 2018-12-10 ENCOUNTER — Encounter (HOSPITAL_COMMUNITY): Payer: Self-pay | Admitting: Emergency Medicine

## 2018-12-10 ENCOUNTER — Emergency Department (HOSPITAL_COMMUNITY)
Admission: EM | Admit: 2018-12-10 | Discharge: 2018-12-11 | Disposition: A | Discharge status: Home / Self Care | Payer: Medicaid Other | Attending: Emergency Medicine | Admitting: Emergency Medicine

## 2018-12-10 DIAGNOSIS — R112 Nausea with vomiting, unspecified: Secondary | ICD-10-CM | POA: Diagnosis present

## 2018-12-10 DIAGNOSIS — F17290 Nicotine dependence, other tobacco product, uncomplicated: Secondary | ICD-10-CM | POA: Insufficient documentation

## 2018-12-10 DIAGNOSIS — R197 Diarrhea, unspecified: Secondary | ICD-10-CM

## 2018-12-10 DIAGNOSIS — N3000 Acute cystitis without hematuria: Secondary | ICD-10-CM | POA: Insufficient documentation

## 2018-12-10 LAB — COMPREHENSIVE METABOLIC PANEL
ALBUMIN: 3.8 g/dL (ref 3.5–5.0)
ALT: 22 U/L (ref 0–44)
AST: 23 U/L (ref 15–41)
Alkaline Phosphatase: 58 U/L (ref 38–126)
Anion gap: 14 (ref 5–15)
BUN: 14 mg/dL (ref 6–20)
CHLORIDE: 98 mmol/L (ref 98–111)
CO2: 25 mmol/L (ref 22–32)
Calcium: 8.8 mg/dL — ABNORMAL LOW (ref 8.9–10.3)
Creatinine, Ser: 0.91 mg/dL (ref 0.44–1.00)
GFR calc Af Amer: >60 mL/min (ref >60)
GFR calc non Af Amer: >60 mL/min (ref >60)
GLUCOSE: 101 mg/dL — AB (ref 70–99)
POTASSIUM: 3.3 mmol/L — AB (ref 3.5–5.1)
Sodium: 137 mmol/L (ref 135–145)
Total Bilirubin: 0.9 mg/dL (ref 0.3–1.2)
Total Protein: 6.5 g/dL (ref 6.5–8.1)

## 2018-12-10 LAB — CBC
HCT: 44.4 % (ref 36.0–46.0)
Hemoglobin: 14.9 g/dL (ref 12.0–15.0)
MCH: 28.8 pg (ref 26.0–34.0)
MCHC: 33.6 g/dL (ref 30.0–36.0)
MCV: 85.7 fL (ref 80.0–100.0)
NRBC: 0 % (ref 0.0–0.2)
Platelets: 254 10*3/uL (ref 150–400)
RBC: 5.18 MIL/uL — AB (ref 3.87–5.11)
RDW: 13.6 % (ref 11.5–15.5)
WBC: 9.5 10*3/uL (ref 4.0–10.5)

## 2018-12-10 LAB — LIPASE, BLOOD: LIPASE: 22 U/L (ref 11–51)

## 2018-12-10 MED ORDER — AZITHROMYCIN 250 MG PO TABS
1000.0000 mg | ORAL_TABLET | Freq: Once | ORAL | Status: AC
  Administered 2018-12-11: 1000 mg via ORAL
  Filled 2018-12-10: qty 4

## 2018-12-10 MED ORDER — DICYCLOMINE HCL 10 MG/ML IM SOLN
20.0000 mg | Freq: Once | INTRAMUSCULAR | Status: AC
  Administered 2018-12-11: 20 mg via INTRAMUSCULAR
  Filled 2018-12-10: qty 2

## 2018-12-10 MED ORDER — SODIUM CHLORIDE 0.9 % IV BOLUS (SEPSIS)
1000.0000 mL | Freq: Once | INTRAVENOUS | Status: AC
  Administered 2018-12-11: 1000 mL via INTRAVENOUS

## 2018-12-10 MED ORDER — ONDANSETRON HCL 4 MG/2ML IJ SOLN
4.0000 mg | Freq: Once | INTRAMUSCULAR | Status: AC
  Administered 2018-12-11: 4 mg via INTRAVENOUS
  Filled 2018-12-10: qty 2

## 2018-12-10 MED ORDER — CEFTRIAXONE SODIUM 250 MG IJ SOLR
250.0000 mg | Freq: Once | INTRAMUSCULAR | Status: AC
  Administered 2018-12-11: 250 mg via INTRAMUSCULAR
  Filled 2018-12-10: qty 250

## 2018-12-10 MED ORDER — METRONIDAZOLE 500 MG PO TABS
2000.0000 mg | ORAL_TABLET | Freq: Once | ORAL | Status: AC
  Administered 2018-12-11: 2000 mg via ORAL
  Filled 2018-12-10: qty 4

## 2018-12-10 NOTE — ED Triage Notes (Signed)
C/o vomiting since 4pm today.  States she did have abd pain earlier but it resolved.  Denies diarrhea.

## 2018-12-11 LAB — URINALYSIS, ROUTINE W REFLEX MICROSCOPIC
BILIRUBIN URINE: NEGATIVE
GLUCOSE, UA: NEGATIVE mg/dL
KETONES UR: 20 mg/dL — AB
NITRITE: POSITIVE — AB
PH: 5 (ref 5.0–8.0)
Protein, ur: NEGATIVE mg/dL
Specific Gravity, Urine: 1.017 (ref 1.005–1.030)

## 2018-12-11 LAB — POC URINE PREG, ED: Preg Test, Ur: NEGATIVE

## 2018-12-11 LAB — RPR: RPR Ser Ql: NONREACTIVE

## 2018-12-11 LAB — HIV ANTIBODY (ROUTINE TESTING W REFLEX): HIV SCREEN 4TH GENERATION: NONREACTIVE

## 2018-12-11 MED ORDER — PROMETHAZINE HCL 25 MG/ML IJ SOLN
25.0000 mg | Freq: Once | INTRAMUSCULAR | Status: AC
  Administered 2018-12-11: 25 mg via INTRAVENOUS
  Filled 2018-12-11: qty 1

## 2018-12-11 MED ORDER — ONDANSETRON 4 MG PO TBDP: 4.0000 mg | ORAL_TABLET | Freq: Four times a day (QID) | ORAL | 0 refills | Status: DC | PRN

## 2018-12-11 MED ORDER — LIDOCAINE HCL (PF) 1 % IJ SOLN
INTRAMUSCULAR | Status: AC
  Administered 2018-12-11: 0.9 mL
  Filled 2018-12-11: qty 5

## 2018-12-11 MED ORDER — CEPHALEXIN 500 MG PO CAPS: 500.0000 mg | ORAL_CAPSULE | Freq: Two times a day (BID) | ORAL | 0 refills | Status: DC
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
## 2018-12-11 NOTE — Discharge Instructions (Addendum)
You may alternate Tylenol 1000 mg every 6 hours as needed for pain and Ibuprofen 800 mg every 8 hours as needed for pain.  Please take Ibuprofen with food.  You may use over-the-counter Imodium as needed for diarrhea.  You have been treated for gonorrhea, chlamydia and trichomonas today.  I recommend avoiding any sexual intercourse for the next week.  Your HIV and syphilis testing are currently pending.

## 2018-12-13 LAB — URINE CULTURE: Culture: >=100000 — AB

## 2018-12-14 ENCOUNTER — Telehealth: Payer: Self-pay

## 2018-12-14 NOTE — Telephone Encounter (Signed)
Post ED Visit - Positive Culture Follow-up  Culture report reviewed by antimicrobial stewardship pharmacist:  []  Elenor Quinones, Pharm.D. []  Heide Guile, Pharm.D., BCPS AQ-ID []  Parks Neptune, Pharm.D., BCPS []  Alycia Rossetti, Pharm.D., BCPS []  [place], Pharm.D., BCPS, AAHIVP []  Legrand Como, Pharm.D., BCPS, AAHIVP []  Salome Arnt, PharmD, BCPS []  Johnnette Gourd, PharmD, BCPS []  Hughes Better, PharmD, BCPS []  Leeroy Cha, PharmD Laqueta Linden Pharm D Positive urine culture Treated with Cephalexin, organism sensitive to the same and no further patient follow-up is required at this time.  Genia Del 12/14/2018, 11:51 AM

## 2019-05-22 ENCOUNTER — Other Ambulatory Visit: Payer: Self-pay

## 2019-05-22 ENCOUNTER — Encounter (HOSPITAL_COMMUNITY): Payer: Self-pay

## 2019-05-22 ENCOUNTER — Emergency Department (HOSPITAL_COMMUNITY)
Admission: EM | Admit: 2019-05-22 | Discharge: 2019-05-23 | Disposition: A | Discharge status: Home / Self Care | Payer: Medicaid Other | Attending: Emergency Medicine | Admitting: Emergency Medicine

## 2019-05-22 DIAGNOSIS — F1010 Alcohol abuse, uncomplicated: Secondary | ICD-10-CM | POA: Diagnosis not present

## 2019-05-22 LAB — CBC
HCT: 43.4 % (ref 36.0–46.0)
Hemoglobin: 15.3 g/dL — ABNORMAL HIGH (ref 12.0–15.0)
MCH: 30.7 pg (ref 26.0–34.0)
MCHC: 35.3 g/dL (ref 30.0–36.0)
MCV: 87 fL (ref 80.0–100.0)
Platelets: 247 10*3/uL (ref 150–400)
RBC: 4.99 MIL/uL (ref 3.87–5.11)
RDW: 13.2 % (ref 11.5–15.5)
WBC: 6.8 10*3/uL (ref 4.0–10.5)
nRBC: 0 % (ref 0.0–0.2)

## 2019-05-22 LAB — COMPREHENSIVE METABOLIC PANEL
ALT: 16 U/L (ref 0–44)
AST: 21 U/L (ref 15–41)
Albumin: 4.2 g/dL (ref 3.5–5.0)
Alkaline Phosphatase: 57 U/L (ref 38–126)
Anion gap: 9 (ref 5–15)
BUN: 10 mg/dL (ref 6–20)
CO2: 25 mmol/L (ref 22–32)
Calcium: 9.3 mg/dL (ref 8.9–10.3)
Chloride: 105 mmol/L (ref 98–111)
Creatinine, Ser: 0.89 mg/dL (ref 0.44–1.00)
GFR calc Af Amer: >60 mL/min (ref >60)
GFR calc non Af Amer: >60 mL/min (ref >60)
Glucose, Bld: 95 mg/dL (ref 70–99)
Potassium: 3.8 mmol/L (ref 3.5–5.1)
Sodium: 139 mmol/L (ref 135–145)
Total Bilirubin: 0.6 mg/dL (ref 0.3–1.2)
Total Protein: 6.9 g/dL (ref 6.5–8.1)

## 2019-05-22 LAB — I-STAT BETA HCG BLOOD, ED (MC, WL, AP ONLY): I-stat hCG, quantitative: <5 m[IU]/mL (ref <5)

## 2019-05-22 LAB — SALICYLATE LEVEL: Salicylate Lvl: <7 mg/dL (ref 2.8–30.0)

## 2019-05-22 LAB — RAPID URINE DRUG SCREEN, HOSP PERFORMED
Amphetamines: NOT DETECTED
Barbiturates: NOT DETECTED
Benzodiazepines: NOT DETECTED
Cocaine: NOT DETECTED
Opiates: NOT DETECTED
Tetrahydrocannabinol: NOT DETECTED

## 2019-05-22 LAB — ETHANOL: Alcohol, Ethyl (B): <10 mg/dL (ref <10)

## 2019-05-22 LAB — ACETAMINOPHEN LEVEL: Acetaminophen (Tylenol), Serum: <10 ug/mL — ABNORMAL LOW (ref 10–30)

## 2019-05-22 NOTE — ED Triage Notes (Signed)
Pt here for medical clearance to detox from alcohol. Pt reports 24 oz a beer today. Denies any drug use. Denies SI/HI. Pt calm and cooperative
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
## 2019-05-23 NOTE — Discharge Instructions (Addendum)
Substance Abuse Treatment Programs ° °Intensive Outpatient Programs °[facility]     °[address]      °[place], [place]                   °[phone]      ° °[facility] °[address] °[place], [place] °[phone] ° °[facility]     °(Inpatient and outpatient)     °[address]           °[phone]   ° °[facility] °[phone] (Suboxone and Methadone) ° °[address]      °[place], [place] [address]      °[phone]      ° °[address] °[place], [place] °852-3033 ° °[facility] (Outpatient/Inpatient, Chemical)    °(insurance only) [phone]      °       °[facility] (Groups & Residential) °[place], [place] °[phone] ° °   °[facility]     °[address]     °[place], [place]      °[phone]      ° °[facility] (for caregivers and family) °[address] °[place], [place] °336-299-4655 ° ° ° ° ° °Residential Treatment Programs °[facility]      °[address], [place], [place] [address]  °[phone]      ° °[facility] °[address] [place], [place] [address] °[phone] ° °[facility]        °[phone]      ° °[facility] °[phone] ° °[facility]             °[address]                                         °[place], [place]                                                °[phone] or [phone]                              ° °[facility] [place] °[address] °[place], [address] °[phone] ° °[facility]    °[address]      °[place], [place]     °[phone]      ° °[facility] °[address] °, [place] °[phone] ° °[facility]   °[address]     °[place], [place] [address]     °[phone]      °Admissions: 8am-3pm M-F ° °[facility] °[address] °[place] °[phone] ° °[facility]: Residential Program (90 Days)   °[place], [place]      °336-725-8389 or [phone]    ° °[facility] °[place],  °(Walk in Hours over the weekend or by referral) ° °[facility] °[address], [place],  [address] °([phone] ° °Crisis Mobile: [facility]:  1-877-626-1772 (for crisis response 24 hours a day) °[facility] Hotline:      1-800-256-2452 °Outpatient Psychiatry and Counseling ° °[facility]: Mobile Crisis   Management 24 hours:  1-877-626-1772 ° °[facility] sliding scale fee and walk in schedule: M-F 8am-12pm/1pm-3pm °[address]  °[place], [place] [address] °[phone] ° °[facility] °[address] °[place], [place] [address] °[phone] ° °[facility] ([facility])- new patient walk-in appointments available Monday - Friday 8am -3pm.          °[address] °[place], [place] [address] °336-676-6840 or crisis line- 336-676-6905 ° °[facility] Intensive Outpatient Therapy Program °[address] °[place], [place] [address] °336-832-9804 ° °[facility]                  °Crisis Services      °[phone]      °[address]     °[place], [place] [address]                ° °[facility]   °[facility] °[phone] °[address] °[place], [place] [address] ° ° °[facility]          °[address]  °[place], [place] [address]       °([phone] ° °[facility] °[address] °[place], [place] [address] °336-292-1510 ° °[facility]    °[address]    °[place], [place] [address]     °[phone]      ° °Parish McKinney, MD     °[address]     °[place] [address]     °336-282-1251     °  °[facility] °[address] °[place] [address] ° °[facility]     °[address]     °[place], [place]      °336-542-2076      ° °[facility] °Shamsher Ahluwalia, MD °[address] °[place], [place] [address] °[phone] ° °[facility]     °[address]     °[place], [place] [address]     °336-274-1237      ° °[facility] °[address] °[place], [place] [address] °336-854-4450 °Resources for Temporary Residential Assistance/Crisis Centers ° °DAY CENTERS °[facility] °M-F 8am-3pm   °[address] [place], [place] [address]   336-332-0824 °Services include: laundry, barbering, support groups, case management, phone  & computer access, showers, AA/NA mtgs, mental health/substance abuse nurse, job skills class, disability information, VA assistance, spiritual classes, etc.  ° °HOMELESS SHELTERS ° °[facility]     °[facility]   °[address], [place]     °[phone]       °       °[facility] (women and children)       °[address] °[place], [place] [address] °336-275-0820 °[email or web address] for application and process °Application Required ° °[facility]   °[address]    °[place] [address]     °[phone]       °             °[facility] °[address] °[place], [place] [address] °[phone] °336-235-0363(schedule application appt.) °Application Required ° °[facility] (women only)    °[address]     °[place],  [address]     °336-884-1039      °  Intake starts 6pm daily °Need valid ID, SSC, & Police report °[facility] [place] °[address] °[place], [place] °336-881-5420 °Application Required ° °[facility] (men only)     °[address]      °[place], [place]     °[phone]      ° °[facility] °(Pregnant women only) °[address] °[place], [place] °336-275-0206 ° °[facility]      °[address]      °[place], [place] [address]     °[phone]      °       °[facility] °[address] °[place], [place] °336-723-1848 °90 day commitment/SA/Application process ° °[facility](men only)     °[address]     °[place], [place]     °[phone]       °Check-in at 7pm     °       °[facility] °[address] °[place], [place] [address] °336-248-6684 °Men/Women/Women and Children must be there by 7 pm ° °[facility] °[place],  °336-722-8721                ° °

## 2019-05-23 NOTE — ED Provider Notes (Signed)
[facility] EMERGENCY DEPARTMENT Provider Note   CSN: [id number] Arrival date & time: 05/22/19  1949     History   Chief Complaint Chief Complaint  Patient presents with  . Medical Clearance    HPI Anita Hill is a 48 y.o. female.     The history is provided by the patient.  Mental Health Problem Presenting symptoms: no suicidal thoughts and no suicidal threats   Degree of incapacity (severity):  Moderate Onset quality:  Gradual Timing:  Constant Progression:  Unchanged Chronicity:  Chronic Context: alcohol use   Context: not drug abuse   Relieved by:  Nothing Worsened by:  Nothing Associated symptoms: no chest pain   Patient presents for alcohol abuse.  She reports she drinks approximately 3 24 ounce beers a day for the past year.  Denies active drug use.  No fevers or vomiting.  No recent seizures.  No chest pain or shortness of breath She wanted to be medically cleared so she can go to a rehab facility  Past Medical History:  Diagnosis Date  . BIPOLAR DISORDER   . Delusions ([place])   . Homelessness   . Left ventricular hypertrophy   . Palpitations   . Psychosis ([place])   . Sickle cell trait ([place])   . Substance abuse [facility])     Patient Active Problem List   Diagnosis Date Noted  . Bipolar mixed affective disorder, moderate ([place]) 11/23/2016  . Chest pain 01/20/2016  . Dyspnea 10/13/2012  . Tobacco abuse 07/06/2012  . Palpitations   . Psychosis ([place])   . Congestive heart failure ([place])   . BIPOLAR DISORDER 12/29/2006  . BACK PAIN, LOW 12/29/2006    Past Surgical History:  Procedure Laterality Date  . BUNIONECTOMY       OB History    Gravida  1   Para      Term      Preterm      AB      Living        SAB      TAB      Ectopic      Multiple      Live Births               Home Medications    Prior to Admission medications   Medication Sig Start Date End Date Taking? Authorizing Provider  potassium  chloride SA (K-DUR,KLOR-CON) 20 MEQ tablet Take 1 tablet (20 mEq total) by mouth 2 (two) times daily. Patient not taking: Reported on 12/11/2018 01/20/16 05/23/19  Lelon Perla, MD    Family History Family History  Problem Relation Age of Onset  . Heart disease Mother        CHF  . Prostate cancer Father     Social History Social History   Tobacco Use  . Smoking status: Current Every Day Smoker    Packs/day: 1.00    Years: 10.00    Pack years: 10.00    Types: Cigarettes, Cigars  . Smokeless tobacco: Never Used  Substance Use Topics  . Alcohol use: Yes    Comment: everyday 20-80oz  . Drug use: No    Comment: Previous cocaine, heroin, marijuana     Allergies   Strawberry extract, Latuda [lurasidone hcl], and Risperidone and related   Review of Systems Review of Systems  Constitutional: Negative for fever.  Respiratory: Positive for cough.   Cardiovascular: Negative for chest pain.  Psychiatric/Behavioral: Negative for suicidal ideas.  All  other systems reviewed and are negative.    Physical Exam Updated Vital Signs BP (!) 118/92   Pulse 82   Temp 99.3 F (37.4 C) (Oral)   Resp 16   SpO2 98%   Physical Exam  CONSTITUTIONAL: Well developed/well nourished HEAD: Normocephalic/atraumatic EYES: EOMI/PERRL NECK: supple no meningeal signs CV: S1/S2 noted, no murmurs/rubs/gallops noted LUNGS: Lungs are clear to auscultation bilaterally, no apparent distress ABDOMEN: soft, nontender NEURO: Pt is awake/alert/appropriate, moves all extremitiesx4.  No facial droop.   EXTREMITIES: pulses normal/equal, full ROM, no tremors SKIN: warm, color normal PSYCH: Mildly anxious  ED Treatments / Results  Labs (all labs ordered are listed, but only abnormal results are displayed) Labs Reviewed  ACETAMINOPHEN LEVEL - Abnormal; Notable for the following components:      Result Value   Acetaminophen (Tylenol), Serum <10 (*)    All other components within normal limits   CBC - Abnormal; Notable for the following components:   Hemoglobin 15.3 (*)    All other components within normal limits  COMPREHENSIVE METABOLIC PANEL  ETHANOL  SALICYLATE LEVEL  RAPID URINE DRUG SCREEN, HOSP PERFORMED  I-STAT BETA HCG BLOOD, ED ([facility], [facility], [facility] ONLY)    EKG EKG Interpretation  Date/Time:  Wednesday May 23 2019 01:16:29 EDT Ventricular Rate:  59 PR Interval:    QRS Duration: 92 QT Interval:  453 QTC Calculation: 449 R Axis:   72 Text Interpretation:  Sinus rhythm Probable left atrial enlargement No significant change since last tracing Confirmed by Ripley Fraise [phone]) on 05/23/2019 1:39:44 AM   Radiology No results found.  Procedures Procedures   Medications Ordered in ED Medications - No data to display   Initial Impression / Assessment and Plan / ED Course  I have reviewed the triage vital signs and the nursing notes.  Pertinent labs  results that were available during my care of the patient were reviewed by me and considered in my medical decision making (see chart for details).        1:00 AM Patient reports she needs a medical clearance for rehab.  Reports history of palpitations and LVH.  Vital signs are appropriate. No signs of significant withdrawal.  Vital signs are appropriate.  No tremors noted. 1:40 AM Patient well-appearing, vitals appropriate.  EKG unremarkable.  Will discharge with outpatient resources.  Final Clinical Impressions(s) / ED Diagnoses   Final diagnoses:  Alcohol abuse    ED Discharge Orders    None       Ripley Fraise, MD 05/23/19 0140

## 2019-05-25 ENCOUNTER — Other Ambulatory Visit: Payer: Self-pay

## 2019-05-25 ENCOUNTER — Emergency Department (HOSPITAL_COMMUNITY): Payer: Medicaid Other

## 2019-05-25 ENCOUNTER — Emergency Department (HOSPITAL_COMMUNITY)
Admission: EM | Admit: 2019-05-25 | Discharge: 2019-05-25 | Disposition: A | Discharge status: Home / Self Care | Payer: Medicaid Other | Attending: Emergency Medicine | Admitting: Emergency Medicine

## 2019-05-25 DIAGNOSIS — M25361 Other instability, right knee: Secondary | ICD-10-CM | POA: Insufficient documentation

## 2019-05-25 DIAGNOSIS — M25561 Pain in right knee: Secondary | ICD-10-CM | POA: Diagnosis present

## 2019-05-25 DIAGNOSIS — Z590 Homelessness: Secondary | ICD-10-CM | POA: Insufficient documentation

## 2019-05-25 DIAGNOSIS — F17210 Nicotine dependence, cigarettes, uncomplicated: Secondary | ICD-10-CM | POA: Diagnosis not present

## 2019-05-25 NOTE — ED Notes (Signed)
Ortho paged at this time 
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
## 2019-05-25 NOTE — Discharge Instructions (Signed)
Please follow up with Dr. Erlinda Hong - you may need a MRI of the knee Wear knee immobilizer to prevent knee from bending Continue to rest, ice, and elevate the knee if there is any swelling
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
## 2019-05-25 NOTE — ED Notes (Signed)
Patient transported to X-ray. Asked transport to take pt to Rm 9 when returned from x-ray.

## 2019-05-25 NOTE — ED Notes (Signed)
Ortho at bedside.

## 2019-05-25 NOTE — Progress Notes (Signed)
Orthopedic Tech Progress Note Patient Details:  LACORA FOLMER 1971-02-06 [id number]  Ortho Devices Type of Ortho Device: Crutches, Knee Immobilizer Ortho Device/Splint Location: rle Ortho Device/Splint Interventions: Ordered, Application, Adjustment   Post Interventions Patient Tolerated: Well Instructions Provided: Care of device, Adjustment of device   Karolee Stamps 05/25/2019, 12:20 PM

## 2019-06-01 ENCOUNTER — Ambulatory Visit: Payer: Medicaid Other | Admitting: Podiatry

## 2019-06-04 ENCOUNTER — Emergency Department (HOSPITAL_COMMUNITY): Payer: Medicaid Other

## 2019-06-04 ENCOUNTER — Encounter (HOSPITAL_COMMUNITY): Payer: Self-pay | Admitting: *Deleted

## 2019-06-04 ENCOUNTER — Emergency Department (HOSPITAL_COMMUNITY)
Admission: EM | Admit: 2019-06-04 | Discharge: 2019-06-04 | Disposition: A | Discharge status: Home / Self Care | Payer: Medicaid Other | Attending: Emergency Medicine | Admitting: Emergency Medicine

## 2019-06-04 ENCOUNTER — Other Ambulatory Visit: Payer: Self-pay

## 2019-06-04 DIAGNOSIS — J069 Acute upper respiratory infection, unspecified: Secondary | ICD-10-CM | POA: Diagnosis not present

## 2019-06-04 DIAGNOSIS — M25561 Pain in right knee: Secondary | ICD-10-CM | POA: Insufficient documentation

## 2019-06-04 DIAGNOSIS — I509 Heart failure, unspecified: Secondary | ICD-10-CM | POA: Insufficient documentation

## 2019-06-04 DIAGNOSIS — F17210 Nicotine dependence, cigarettes, uncomplicated: Secondary | ICD-10-CM | POA: Insufficient documentation

## 2019-06-04 DIAGNOSIS — R2242 Localized swelling, mass and lump, left lower limb: Secondary | ICD-10-CM | POA: Insufficient documentation

## 2019-06-04 DIAGNOSIS — Z20828 Contact with and (suspected) exposure to other viral communicable diseases: Secondary | ICD-10-CM | POA: Insufficient documentation

## 2019-06-04 DIAGNOSIS — R600 Localized edema: Secondary | ICD-10-CM

## 2019-06-04 DIAGNOSIS — R059 Cough, unspecified: Secondary | ICD-10-CM

## 2019-06-04 DIAGNOSIS — Z590 Homelessness: Secondary | ICD-10-CM | POA: Insufficient documentation

## 2019-06-04 DIAGNOSIS — R05 Cough: Secondary | ICD-10-CM

## 2019-06-04 LAB — SARS CORONAVIRUS 2 (TAT 6-24 HRS): SARS Coronavirus 2: NEGATIVE

## 2019-06-04 MED ORDER — NAPROXEN 375 MG PO TABS: 375.0000 mg | ORAL_TABLET | Freq: Two times a day (BID) | ORAL | 0 refills | Status: DC

## 2019-06-04 NOTE — Discharge Instructions (Signed)
Keep your scheduled appointment with the orthopedist for further management of knee pain. You can continue to use your brace.   Follow up with your primary care doctor for recheck of recurrent lower extremity swelling.   Your chest x-ray does not show infection or fluid. Your COVID test is pending and should result in 24-48 hours. You can check in MyChart for the results and/or follow up with your doctor for same.

## 2019-06-04 NOTE — ED Triage Notes (Signed)
Pt seen here for knee pain and has follow up appt on Wednesday. Has been wearing a knee immobilizer for comfort. Started noticing increased swelling to R foot with radiating pain to R upper leg.
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
## 2019-06-06 ENCOUNTER — Other Ambulatory Visit: Payer: Self-pay

## 2019-06-06 ENCOUNTER — Ambulatory Visit (INDEPENDENT_AMBULATORY_CARE_PROVIDER_SITE_OTHER): Payer: Medicaid Other | Admitting: Orthopaedic Surgery

## 2019-06-06 ENCOUNTER — Encounter: Payer: Self-pay | Admitting: Orthopaedic Surgery

## 2019-06-06 DIAGNOSIS — M25561 Pain in right knee: Secondary | ICD-10-CM | POA: Diagnosis not present

## 2019-06-06 MED ORDER — DICLOFENAC SODIUM 50 MG PO TBEC: 50.0000 mg | DELAYED_RELEASE_TABLET | Freq: Two times a day (BID) | ORAL | 1 refills | Status: DC | PRN
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
## 2019-06-13 ENCOUNTER — Ambulatory Visit: Payer: Medicaid Other | Admitting: Podiatry

## 2019-06-25 ENCOUNTER — Other Ambulatory Visit: Payer: Self-pay

## 2019-06-25 ENCOUNTER — Ambulatory Visit (HOSPITAL_COMMUNITY)
Admission: AD | Admit: 2019-06-25 | Discharge: 2019-06-25 | Disposition: A | Discharge status: Home / Self Care | Payer: Medicaid Other | Attending: Psychiatry | Admitting: Psychiatry

## 2019-06-25 ENCOUNTER — Encounter (HOSPITAL_COMMUNITY): Payer: Self-pay | Admitting: Emergency Medicine

## 2019-06-25 ENCOUNTER — Emergency Department (HOSPITAL_COMMUNITY)
Admission: EM | Admit: 2019-06-25 | Discharge: 2019-06-27 | Disposition: A | Discharge status: Other Acute Inpatient Hospital | Payer: Medicaid Other | Attending: Emergency Medicine | Admitting: Emergency Medicine

## 2019-06-25 DIAGNOSIS — Z046 Encounter for general psychiatric examination, requested by authority: Secondary | ICD-10-CM | POA: Diagnosis present

## 2019-06-25 DIAGNOSIS — Z590 Homelessness: Secondary | ICD-10-CM | POA: Diagnosis not present

## 2019-06-25 DIAGNOSIS — I509 Heart failure, unspecified: Secondary | ICD-10-CM | POA: Insufficient documentation

## 2019-06-25 DIAGNOSIS — Z20828 Contact with and (suspected) exposure to other viral communicable diseases: Secondary | ICD-10-CM | POA: Diagnosis not present

## 2019-06-25 DIAGNOSIS — F17210 Nicotine dependence, cigarettes, uncomplicated: Secondary | ICD-10-CM | POA: Diagnosis not present

## 2019-06-25 DIAGNOSIS — R4587 Impulsiveness: Secondary | ICD-10-CM

## 2019-06-25 DIAGNOSIS — F259 Schizoaffective disorder, unspecified: Secondary | ICD-10-CM | POA: Diagnosis not present

## 2019-06-25 DIAGNOSIS — R454 Irritability and anger: Secondary | ICD-10-CM

## 2019-06-25 LAB — CBC
HCT: 43 % (ref 36.0–46.0)
Hemoglobin: 15 g/dL (ref 12.0–15.0)
MCH: 30.4 pg (ref 26.0–34.0)
MCHC: 34.9 g/dL (ref 30.0–36.0)
MCV: 87.2 fL (ref 80.0–100.0)
Platelets: 251 10*3/uL (ref 150–400)
RBC: 4.93 MIL/uL (ref 3.87–5.11)
RDW: 13.8 % (ref 11.5–15.5)
WBC: 6.4 10*3/uL (ref 4.0–10.5)
nRBC: 0 % (ref 0.0–0.2)

## 2019-06-25 LAB — COMPREHENSIVE METABOLIC PANEL
ALT: 17 U/L (ref 0–44)
AST: 23 U/L (ref 15–41)
Albumin: 4.3 g/dL (ref 3.5–5.0)
Alkaline Phosphatase: 61 U/L (ref 38–126)
Anion gap: 12 (ref 5–15)
BUN: 18 mg/dL (ref 6–20)
CO2: 25 mmol/L (ref 22–32)
Calcium: 9.5 mg/dL (ref 8.9–10.3)
Chloride: 103 mmol/L (ref 98–111)
Creatinine, Ser: 1 mg/dL (ref 0.44–1.00)
GFR calc Af Amer: >60 mL/min (ref >60)
GFR calc non Af Amer: >60 mL/min (ref >60)
Glucose, Bld: 86 mg/dL (ref 70–99)
Potassium: 3.5 mmol/L (ref 3.5–5.1)
Sodium: 140 mmol/L (ref 135–145)
Total Bilirubin: 0.3 mg/dL (ref 0.3–1.2)
Total Protein: 7 g/dL (ref 6.5–8.1)

## 2019-06-25 LAB — RAPID URINE DRUG SCREEN, HOSP PERFORMED
Amphetamines: NOT DETECTED
Barbiturates: NOT DETECTED
Benzodiazepines: NOT DETECTED
Cocaine: NOT DETECTED
Opiates: NOT DETECTED
Tetrahydrocannabinol: NOT DETECTED

## 2019-06-25 LAB — I-STAT BETA HCG BLOOD, ED (MC, WL, AP ONLY): I-stat hCG, quantitative: <5 m[IU]/mL (ref <5)

## 2019-06-25 LAB — ETHANOL: Alcohol, Ethyl (B): 23 mg/dL — ABNORMAL HIGH (ref <10)
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
## 2019-06-25 NOTE — ED Notes (Addendum)
Pt noted to be yelling, responding to voices, and punching the air. Pt is mad we took her belongings. TTS being performed at bedside. Belongings inventoried and placed in locker #4.

## 2019-06-25 NOTE — B[facility] Counselor (Signed)
Clinician contacted IVC petitioner/room mate Milbert Coulter [place], [phone]) to gather additional information. Clinician left a HIPPA compliant voice message.    Vertell Novak, [place], [facility], [facility] Triage Specialist [phone]

## 2019-06-25 NOTE — BHH Counselor (Signed)
Clinician attempted to contact pt's nurse to fax over IVC paperwork and set up TTS cart however call was rolled over due to no answer then forwarded to a PA. Clinician to attempt again.   Vertell Novak, [place], [facility], [facility] Triage Specialist [phone]
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
## 2019-06-26 LAB — SARS CORONAVIRUS 2 (TAT 6-24 HRS): SARS Coronavirus 2: NEGATIVE

## 2019-06-26 MED ORDER — LORAZEPAM 2 MG/ML IJ SOLN: 0.0000 mg | Freq: Four times a day (QID) | INTRAMUSCULAR | Status: DC

## 2019-06-26 MED ORDER — NICOTINE 21 MG/24HR TD PT24
21.0000 mg | MEDICATED_PATCH | Freq: Every day | TRANSDERMAL | Status: DC
  Administered 2019-06-26 – 2019-06-27 (×2): 21 mg via TRANSDERMAL
  Filled 2019-06-26 (×2): qty 1

## 2019-06-26 MED ORDER — ONDANSETRON HCL 4 MG PO TABS: 4.0000 mg | ORAL_TABLET | Freq: Three times a day (TID) | ORAL | Status: DC | PRN

## 2019-06-26 MED ORDER — IBUPROFEN 400 MG PO TABS: 600.0000 mg | ORAL_TABLET | Freq: Three times a day (TID) | ORAL | Status: DC | PRN

## 2019-06-26 MED ORDER — LORAZEPAM 1 MG PO TABS: 0.0000 mg | ORAL_TABLET | Freq: Four times a day (QID) | ORAL | Status: DC

## 2019-06-26 MED ORDER — VITAMIN B-1 100 MG PO TABS
100.0000 mg | ORAL_TABLET | Freq: Every day | ORAL | Status: DC
  Administered 2019-06-26 – 2019-06-27 (×2): 100 mg via ORAL
  Filled 2019-06-26 (×2): qty 1

## 2019-06-26 MED ORDER — LORAZEPAM 1 MG PO TABS: 0.0000 mg | ORAL_TABLET | Freq: Two times a day (BID) | ORAL | Status: DC

## 2019-06-26 MED ORDER — THIAMINE HCL 100 MG/ML IJ SOLN: 100.0000 mg | Freq: Every day | INTRAMUSCULAR | Status: DC

## 2019-06-26 MED ORDER — LORAZEPAM 2 MG/ML IJ SOLN: 0.0000 mg | Freq: Two times a day (BID) | INTRAMUSCULAR | Status: DC

## 2019-06-26 NOTE — ED Notes (Signed)
TTS done 

## 2019-06-26 NOTE — ED Notes (Signed)
Pt's belongings in locker 1 in purple zone.

## 2019-06-26 NOTE — BHH Counselor (Addendum)
Pt's friend/IVC petitioner Milbert Coulter [place], [phone]), wants an update on the pt's status (if she is coming home), clinican expressed without the pt's consent she is unable to give any information on the pt.   Pt friend wants the pt to call her if possible.    Vertell Novak, [place], [facility], [facility] Triage Specialist [phone]

## 2019-06-26 NOTE — H&P (Signed)
Behavioral Health Medical Screening Exam  Anita Hill is an 48 y.o. female.  Total Time spent with patient: 15 minutes  Psychiatric Specialty Exam: Physical Exam  Constitutional: She is oriented to person, place, and time. She appears well-developed and well-nourished. No distress.  HENT:  Head: Normocephalic and atraumatic.  Right Ear: External ear normal.  Left Ear: External ear normal.  Eyes: Pupils are equal, round, and reactive to light.  Respiratory: Effort normal. No respiratory distress.  Musculoskeletal: Normal range of motion.  Neurological: She is alert and oriented to person, place, and time.  Skin: She is not diaphoretic.  Psychiatric: Her mood appears anxious. She is not withdrawn and not actively hallucinating. Thought content is not paranoid and not delusional. She expresses impulsivity and inappropriate judgment. She exhibits a depressed mood. She expresses no homicidal and no suicidal ideation.    Review of Systems  Constitutional: Positive for fever. Negative for chills, diaphoresis, malaise/fatigue and weight loss.  Respiratory: Positive for cough. Negative for hemoptysis, sputum production, shortness of breath and wheezing.   Cardiovascular: Negative for chest pain.  Gastrointestinal: Negative for diarrhea, nausea and vomiting.  Psychiatric/Behavioral: Positive for depression and substance abuse. Negative for hallucinations, memory loss and suicidal ideas. The patient is nervous/anxious and has insomnia.     Blood pressure (!) 123/96, pulse 85, temperature 99 F (37.2 C), resp. rate 16, SpO2 99 %.There is no height or weight on file to calculate BMI.  General Appearance: Disheveled  Eye Contact:  Fair  Speech:  Clear and Coherent and Normal Rate  Volume:  Increased  Mood:  Anxious  Affect:  Congruent  Thought Process:  Coherent and Descriptions of Associations: Intact  Orientation:  Full (Time, Place, and Person)  Thought Content:  Logical  Suicidal  Thoughts:  No  Homicidal Thoughts:  No  Memory:  Immediate;   Fair Recent;   Fair  Judgement:  Impaired  Insight:  Fair  Psychomotor Activity:  Increased  Concentration: Concentration: Good and Attention Span: Fair  Recall:  Good  Fund of Knowledge:Good  Language: Good  Akathisia:  Negative  Handed:  Right  AIMS (if indicated):     Assets:  Communication Skills Desire for Improvement Housing Le[place] Time Physical Health  Sleep:       Musculoskeletal: Strength & Muscle Tone: within normal limits Gait & Station: normal Patient leans: N/A  Blood pressure (!) 123/96, pulse 85, temperature 99 F (37.2 C), resp. rate 16, SpO2 99 %.  Recommendations:  Based on my evaluation the patient does not appear to have an emergency medical condition.  Patient under IVC was sent to ED per [facility] due to COVID symptoms.   Rozetta Nunnery, NP 06/26/2019, 4:34 AM

## 2019-06-26 NOTE — ED Notes (Signed)
Dr Wilson Singer into do first exam , pt talking to herself and yelling  Up to [facility]

## 2019-06-26 NOTE — Progress Notes (Signed)
Pt meets inpatient criteria per Lindon Romp, NP. Referral information has been sent to the following hospitals for review:  [facility]  [facility]       Disposition will continue to assist with inpatient placement needs.   Audree Camel, LCSW, [place] Disposition [facility] BHH/TTS [phone]
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
## 2019-06-26 NOTE — ED Notes (Signed)
Pt ambulates to purple pod after I did COVID test

## 2019-06-26 NOTE — ED Notes (Signed)
Pt arrived to Rm [facility] - ambulatory - wearing burgundy scrubs. Sitter w/pt.
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
## 2019-06-27 NOTE — ED Notes (Signed)
Regular Diet was ordered for Lunch. 

## 2019-06-27 NOTE — ED Notes (Signed)
Breakfast Ordered 

## 2019-06-27 NOTE — ED Notes (Signed)
Pt. Given snacks- Cookie, graham cracker and cheese stick. As well as, a ginger ale.

## 2019-06-27 NOTE — ED Notes (Signed)
Patient was given Snack and Drinks.

## 2019-06-27 NOTE — Progress Notes (Signed)
Accepted to [facility] [address], report [phone]., accepting Dr Bethanne Ginger.  Requested pt's IVC copies be faxed to [phone] prior to pt's transfer.  Sharren Bridge, MSW, LCSW Transitions of Care 06/27/2019 [phone]

## 2019-06-27 NOTE — ED Notes (Signed)
Pt updated on placement plan, and requested for staff to call family member in [place], pt stated she will call her daughter "Anita Hill" herself. Pt family [facility] [phone],

## 2019-06-27 NOTE — ED Notes (Signed)
Pt. Currently taking a shower at this time, will continue to monitor. Sharyn Lull, [place]

## 2019-06-27 NOTE — ED Notes (Signed)
Pt.s breakfast has arrived. Brought a coffee for pt. As well.

## 2019-06-27 NOTE — ED Notes (Signed)
Daughter- Lana Fish, [phone]

## 2019-06-27 NOTE — ED Notes (Signed)
Pt. Given a cup of ice.

## 2019-06-27 NOTE — ED Notes (Signed)
Pt.s lunch has arrived, pt. Given a ginger ale as well.

## 2019-06-27 NOTE — ED Notes (Signed)
Pt. Asked for paper. Gave pt. 2 pieces of paper and a crayon. Pt. Now has 2 crayons- Dark blue and blue.

## 2019-06-27 NOTE — ED Notes (Signed)
Patient inquired about checked in belongings, Interior and spatial designer at bedside as well upon patient double checking said belongings. Pt was able to look into her belongings and satisfied with what she found. Pt belongings secured back in locker #4.

## 2019-07-04 ENCOUNTER — Ambulatory Visit: Payer: Medicaid Other | Admitting: Orthopaedic Surgery

## 2019-11-01 ENCOUNTER — Ambulatory Visit (HOSPITAL_COMMUNITY)
Admission: EM | Admit: 2019-11-01 | Discharge: 2019-11-01 | Disposition: A | Discharge status: Home / Self Care | Payer: Medicaid Other

## 2019-11-01 ENCOUNTER — Other Ambulatory Visit: Payer: Self-pay

## 2019-11-01 ENCOUNTER — Encounter (HOSPITAL_COMMUNITY): Payer: Self-pay | Admitting: Emergency Medicine

## 2019-11-01 ENCOUNTER — Emergency Department (HOSPITAL_COMMUNITY)
Admission: EM | Admit: 2019-11-01 | Discharge: 2019-11-01 | Disposition: A | Discharge status: Home / Self Care | Payer: Medicaid Other | Attending: Emergency Medicine | Admitting: Emergency Medicine

## 2019-11-01 DIAGNOSIS — Z113 Encounter for screening for infections with a predominantly sexual mode of transmission: Secondary | ICD-10-CM | POA: Insufficient documentation

## 2019-11-01 DIAGNOSIS — Z5321 Procedure and treatment not carried out due to patient leaving prior to being seen by health care provider: Secondary | ICD-10-CM | POA: Insufficient documentation
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
## 2019-11-01 NOTE — ED Notes (Signed)
Pt walked most of way to room then turned around to walk back towards lobby, pt refused to explain where she was going.

## 2019-11-01 NOTE — ED Notes (Signed)
Due to patient complaints, Anita Hill ( patient access) reports patient went to ED

## 2019-11-16 ENCOUNTER — Emergency Department (HOSPITAL_COMMUNITY)
Admission: EM | Admit: 2019-11-16 | Discharge: 2019-11-17 | Disposition: A | Discharge status: Other Acute Inpatient Hospital | Payer: Medicaid Other | Attending: Emergency Medicine | Admitting: Emergency Medicine

## 2019-11-16 DIAGNOSIS — Z046 Encounter for general psychiatric examination, requested by authority: Secondary | ICD-10-CM | POA: Diagnosis present

## 2019-11-16 DIAGNOSIS — Z20822 Contact with and (suspected) exposure to covid-19: Secondary | ICD-10-CM | POA: Diagnosis not present

## 2019-11-16 DIAGNOSIS — F29 Unspecified psychosis not due to a substance or known physiological condition: Secondary | ICD-10-CM | POA: Insufficient documentation

## 2019-11-16 DIAGNOSIS — F10920 Alcohol use, unspecified with intoxication, uncomplicated: Secondary | ICD-10-CM | POA: Insufficient documentation

## 2019-11-16 DIAGNOSIS — F319 Bipolar disorder, unspecified: Secondary | ICD-10-CM | POA: Diagnosis not present

## 2019-11-16 DIAGNOSIS — Y904 Blood alcohol level of 80-99 mg/100 ml: Secondary | ICD-10-CM | POA: Insufficient documentation

## 2019-11-16 DIAGNOSIS — F17210 Nicotine dependence, cigarettes, uncomplicated: Secondary | ICD-10-CM | POA: Insufficient documentation

## 2019-11-16 LAB — I-STAT BETA HCG BLOOD, ED (MC, WL, AP ONLY): I-stat hCG, quantitative: <5 m[IU]/mL (ref <5)

## 2019-11-16 LAB — CBC
HCT: 43.3 % (ref 36.0–46.0)
Hemoglobin: 14.9 g/dL (ref 12.0–15.0)
MCH: 30.1 pg (ref 26.0–34.0)
MCHC: 34.4 g/dL (ref 30.0–36.0)
MCV: 87.5 fL (ref 80.0–100.0)
Platelets: 272 10*3/uL (ref 150–400)
RBC: 4.95 MIL/uL (ref 3.87–5.11)
RDW: 14.6 % (ref 11.5–15.5)
WBC: 6 10*3/uL (ref 4.0–10.5)
nRBC: 0 % (ref 0.0–0.2)

## 2019-11-16 LAB — SALICYLATE LEVEL: Salicylate Lvl: <7 mg/dL — ABNORMAL LOW (ref 7.0–30.0)

## 2019-11-16 LAB — COMPREHENSIVE METABOLIC PANEL
ALT: 20 U/L (ref 0–44)
AST: 23 U/L (ref 15–41)
Albumin: 4.3 g/dL (ref 3.5–5.0)
Alkaline Phosphatase: 62 U/L (ref 38–126)
Anion gap: 13 (ref 5–15)
BUN: 15 mg/dL (ref 6–20)
CO2: 27 mmol/L (ref 22–32)
Calcium: 9.5 mg/dL (ref 8.9–10.3)
Chloride: 103 mmol/L (ref 98–111)
Creatinine, Ser: 0.81 mg/dL (ref 0.44–1.00)
GFR calc Af Amer: >60 mL/min (ref >60)
GFR calc non Af Amer: >60 mL/min (ref >60)
Glucose, Bld: 89 mg/dL (ref 70–99)
Potassium: 3.6 mmol/L (ref 3.5–5.1)
Sodium: 143 mmol/L (ref 135–145)
Total Bilirubin: 0.6 mg/dL (ref 0.3–1.2)
Total Protein: 7 g/dL (ref 6.5–8.1)

## 2019-11-16 LAB — RAPID URINE DRUG SCREEN, HOSP PERFORMED
Amphetamines: NOT DETECTED
Barbiturates: NOT DETECTED
Benzodiazepines: NOT DETECTED
Cocaine: NOT DETECTED
Opiates: NOT DETECTED
Tetrahydrocannabinol: NOT DETECTED

## 2019-11-16 LAB — ETHANOL: Alcohol, Ethyl (B): 91 mg/dL — ABNORMAL HIGH (ref <10)

## 2019-11-16 LAB — RESPIRATORY PANEL BY RT PCR (FLU A&B, COVID)
Influenza A by PCR: NEGATIVE
Influenza B by PCR: NEGATIVE
SARS Coronavirus 2 by RT PCR: NEGATIVE

## 2019-11-16 LAB — ACETAMINOPHEN LEVEL: Acetaminophen (Tylenol), Serum: <10 ug/mL — ABNORMAL LOW (ref 10–30)

## 2019-11-16 MED ORDER — NICOTINE 21 MG/24HR TD PT24
21.0000 mg | MEDICATED_PATCH | Freq: Once | TRANSDERMAL | Status: DC
  Administered 2019-11-16: 21 mg via TRANSDERMAL
  Filled 2019-11-16: qty 1

## 2019-11-16 MED ORDER — LORAZEPAM 2 MG/ML IJ SOLN
0.5000 mg | Freq: Once | INTRAMUSCULAR | Status: AC
  Administered 2019-11-16: 0.5 mg via INTRAMUSCULAR
  Filled 2019-11-16: qty 1

## 2019-11-16 NOTE — Progress Notes (Addendum)
Patient meets inpatient criteria per Lindon Romp, NP. Patient has been faxed out to the following facilities for review:   [facility] Details   [facility] Details   [facility] Details   [facility] Details   [facility] Details   [facility] Details   [facility] Details   [facility] Details   [facility] Details   [facility] Details   [facility] Details   [facility] Details   [facility] Details   [facility] Details   [facility] Details   [facility] Details   [place] will continue to follow and assist with disposition planning.   Domenic Schwab, MSW, LCSW-A Clinical Disposition Social Worker [facility]/TTS [phone]
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
## 2019-11-16 NOTE — BHH Counselor (Signed)
Pt's daughter [name], [phone]), would like an update on the pt's status. Clinician expressed due the pt being her own guardian consent would need to be obtained before information is given.   *Ask pt if information can be given to her daughter has she is deeply concerned.*  Vertell Novak, MS, [facility], [facility] Triage Specialist [phone]

## 2019-11-16 NOTE — BHH Counselor (Signed)
Clinician attempted to call TTS cart however received an error message (Can not reach cart please try again later.)  Clinician to follow up with Will, RN.   Vertell Novak, [place], [facility], [facility] Triage Specialist [phone]

## 2019-11-16 NOTE — Progress Notes (Signed)
CSW heard back from [facility]. They are at capacity this evening. They stated that if the patient is going to need placement on Saturday, to please refer her again.   CSW will plan on referring patient again tomorrow.   Domenic Schwab, MSW, LCSW-A Clinical Disposition Social Worker [facility]/TTS [phone]
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
## 2019-11-16 NOTE — BHH Counselor (Signed)
Clinician spoke to Will, RN to set up TTS cart. Clinician to call cart in 10 minutes.    Vertell Novak, [place], [facility], [facility] Triage Specialist [phone]

## 2019-11-16 NOTE — ED Notes (Signed)
Pt now asleep
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
## 2019-11-16 NOTE — BHH Counselor (Signed)
Clinician spoke to Will, RN that she's receiving an error message when trying to call TTS cart. RN to look at cart. Clinician to call back in 10 minutes.     Vertell Novak, [place], [facility], [facility] Triage Specialist [phone]

## 2019-11-16 NOTE — BHH Counselor (Signed)
Clinician attempted to call pt's nurse to fax pt's IVC paperwork however the phone line is bust clinician to try back.    Vertell Novak, [place], [facility], [facility] Triage Specialist [phone]

## 2019-11-16 NOTE — ED Notes (Signed)
All paper work sent to [facility], original put into the red folder and a copy placed into MR.

## 2019-11-16 NOTE — ED Notes (Signed)
Called staffing for sitter, none avail. As of now

## 2019-11-16 NOTE — ED Notes (Signed)
Belongings searched 1 pink shirt 1 black leggings 1 pair red shoes 1 pair slippers 1 black bra 1 set pink pajamas 1 black coat 1 white coat 1 nail kit 1 toiletry bag 1 pair yellow colored errings 1 black bandana 1 yellow bandana 4 folders msc papers 2 bags misc papers 2 books 1 icehouse beer pour out, witnessed by [employer] All in 5 bags placed in locker

## 2019-11-17 ENCOUNTER — Other Ambulatory Visit: Payer: Self-pay

## 2019-11-17 NOTE — ED Notes (Signed)
Lunch Tray Ordered @ 1020.  

## 2019-11-17 NOTE — ED Notes (Signed)
IVC Combative  Breakfast ordered

## 2019-11-17 NOTE — Progress Notes (Signed)
CSW spoke with Christinia Gully at [facility] who requested negative COVID results be sent before pt could be accepted to facility.   CSW faxed results to [facility].   Darletta Moll MSW, [place] Worker Disposition  [facility] Ph: [phone] Fax: [phone]

## 2019-11-17 NOTE — Progress Notes (Signed)
Pt accepted to [facility] Dr. Abbey Chatters is the accepting provider.  Call report to [phone] Jacqlyn Larsen, RN @ [facility] ED notified.   Pt is IVC  Pt may be transported by [employer] Pt scheduled to arrive as soon as transportation is set up.   Darletta Moll MSW, [place] Worker Disposition  [facility] Ph: [phone] Fax: [phone]  11/17/2019 9:34 AM
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
## 2019-11-17 NOTE — ED Notes (Signed)
Pt attempted to shower but advised water would not get warm - so pt changed into new pair of scrubs and returned to room. Lying on bed w/eyes closed. Respirations even, unlabored.

## 2019-11-17 NOTE — ED Provider Notes (Signed)
11:51 AM Nursing reports that patient has been accepted to inpatient psychiatric facility at [facility] under the care of Dr. Iantha Fallen.   I went to reassess the patient and she had no complaints.  She was watching the golf on TV.  She denies any chest pain or shortness of breath or other physical concerns.  Patient agrees with transfer.  [facility] documentation filled out and she will be transferred.   Anita Hill, Anita Allegra, MD 11/17/19 1152

## 2019-11-17 NOTE — ED Notes (Signed)
Copy of IVC papers faxed to [facility].

## 2019-11-17 NOTE — ED Notes (Signed)
Deputy transporting pt to [facility] - Daughter, [name] [phone] - aware pt is being transported to [facility]. Advised she is going to attempt to obtain Legal Guardianship as she states pt has exhausted all of resources.

## 2019-11-19 LAB — GC/CHLAMYDIA PROBE AMP (~~LOC~~) NOT AT ARMC
Chlamydia: NEGATIVE
Neisseria Gonorrhea: NEGATIVE

## 2020-05-01 ENCOUNTER — Other Ambulatory Visit: Payer: Self-pay

## 2020-05-01 ENCOUNTER — Emergency Department (HOSPITAL_COMMUNITY): Payer: Medicaid Other

## 2020-05-01 ENCOUNTER — Encounter (HOSPITAL_COMMUNITY): Payer: Self-pay | Admitting: Emergency Medicine

## 2020-05-01 ENCOUNTER — Emergency Department (HOSPITAL_COMMUNITY)
Admission: EM | Admit: 2020-05-01 | Discharge: 2020-05-01 | Disposition: A | Discharge status: Home / Self Care | Payer: Medicaid Other | Attending: Emergency Medicine | Admitting: Emergency Medicine

## 2020-05-01 DIAGNOSIS — Y9389 Activity, other specified: Secondary | ICD-10-CM | POA: Insufficient documentation

## 2020-05-01 DIAGNOSIS — S82142A Displaced bicondylar fracture of left tibia, initial encounter for closed fracture: Secondary | ICD-10-CM | POA: Insufficient documentation

## 2020-05-01 DIAGNOSIS — Y999 Unspecified external cause status: Secondary | ICD-10-CM | POA: Insufficient documentation

## 2020-05-01 DIAGNOSIS — R52 Pain, unspecified: Secondary | ICD-10-CM | POA: Diagnosis not present

## 2020-05-01 DIAGNOSIS — S82832A Other fracture of upper and lower end of left fibula, initial encounter for closed fracture: Secondary | ICD-10-CM | POA: Diagnosis not present

## 2020-05-01 DIAGNOSIS — Y92410 Unspecified street and highway as the place of occurrence of the external cause: Secondary | ICD-10-CM | POA: Insufficient documentation

## 2020-05-01 DIAGNOSIS — F17210 Nicotine dependence, cigarettes, uncomplicated: Secondary | ICD-10-CM | POA: Insufficient documentation

## 2020-05-01 DIAGNOSIS — S46911A Strain of unspecified muscle, fascia and tendon at shoulder and upper arm level, right arm, initial encounter: Secondary | ICD-10-CM | POA: Insufficient documentation

## 2020-05-01 DIAGNOSIS — M7989 Other specified soft tissue disorders: Secondary | ICD-10-CM | POA: Diagnosis not present

## 2020-05-01 DIAGNOSIS — M25462 Effusion, left knee: Secondary | ICD-10-CM | POA: Diagnosis not present

## 2020-05-01 DIAGNOSIS — S82402A Unspecified fracture of shaft of left fibula, initial encounter for closed fracture: Secondary | ICD-10-CM | POA: Diagnosis not present

## 2020-05-01 DIAGNOSIS — S82122A Displaced fracture of lateral condyle of left tibia, initial encounter for closed fracture: Secondary | ICD-10-CM | POA: Diagnosis not present

## 2020-05-01 DIAGNOSIS — S8992XA Unspecified injury of left lower leg, initial encounter: Secondary | ICD-10-CM | POA: Diagnosis present

## 2020-05-01 DIAGNOSIS — W19XXXA Unspecified fall, initial encounter: Secondary | ICD-10-CM | POA: Diagnosis not present

## 2020-05-01 MED ORDER — ACETAMINOPHEN 325 MG PO TABS
650.0000 mg | ORAL_TABLET | Freq: Once | ORAL | Status: AC
  Administered 2020-05-01: 650 mg via ORAL

## 2020-05-01 MED ORDER — HYDROCODONE-ACETAMINOPHEN 5-325 MG PO TABS
1.0000 | ORAL_TABLET | Freq: Once | ORAL | Status: AC
  Administered 2020-05-01: 1 via ORAL
  Filled 2020-05-01: qty 1

## 2020-05-01 MED ORDER — HYDROCODONE-ACETAMINOPHEN 5-325 MG PO TABS: 1.0000 | ORAL_TABLET | Freq: Four times a day (QID) | ORAL | 0 refills | Status: DC | PRN

## 2020-05-01 NOTE — ED Notes (Signed)
Patient transported to CT 
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
## 2020-05-01 NOTE — Discharge Instructions (Addendum)
Take ibuprofen 3 times a day with meals.  Do not take other anti-inflammatories at the same time (Advil, Motrin, naproxen, Aleve). You may supplement with Tylenol if you need further pain control. Use Norco as needed for severe breakthrough pain.  Have caution, this may make you tired or groggy.  Do not drive or operate heavy machinery while taking this medicine. Keep your leg elevated. Keep your knee immobilizer on and use crutches to get around. Do not put weight on your let until you follow up with the orthopedic doctor.  Use ice packs for pain and swelling.  Call your orthopedic doctor to set up a follow-up appointment. Return to the emergency room if you develop severe worsening pain, numbness of your foot, color change of your foot, or any new, worsening, or concerning symptoms.

## 2020-05-01 NOTE — Progress Notes (Signed)
Orthopedic Tech Progress Note Patient Details:  Anita Hill 01/28/1971 [id number]  Ortho Devices Type of Ortho Device: Crutches, Knee Immobilizer Ortho Device/Splint Location: Left Knee Ortho Device/Splint Interventions: Application, Adjustment   Post Interventions Patient Tolerated: Well Instructions Provided: Adjustment of device, Poper ambulation with device   Dellie Piasecki E Damarkus Balis 05/01/2020, 11:10 PM
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
## 2020-05-06 ENCOUNTER — Ambulatory Visit: Payer: Medicaid Other | Admitting: Orthopaedic Surgery

## 2020-05-06 ENCOUNTER — Telehealth: Payer: Self-pay | Admitting: Orthopaedic Surgery

## 2020-05-06 MED ORDER — HYDROCODONE-ACETAMINOPHEN 5-325 MG PO TABS: 1.0000 | ORAL_TABLET | Freq: Every day | ORAL | 0 refills | Status: DC | PRN

## 2020-05-06 NOTE — Telephone Encounter (Signed)
Please advise 

## 2020-05-06 NOTE — Telephone Encounter (Signed)
Patient called rescheduled appointment.     Patient asked if she can get a Rx for Ibuprofen sent to [facility] on [address]. Patient said if Dr. Erlinda Hong prescribe Vicodin and Ibuprofen she have to get the Rx's filled at [facility] at [place]   The number to contact patient is [phone]

## 2020-05-06 NOTE — Telephone Encounter (Signed)
Yes to ibuprofen. No to vicodin

## 2020-05-06 NOTE — Telephone Encounter (Signed)
Vicodin refill.

## 2020-05-09 ENCOUNTER — Telehealth: Payer: Self-pay | Admitting: Orthopaedic Surgery

## 2020-05-09 ENCOUNTER — Ambulatory Visit (INDEPENDENT_AMBULATORY_CARE_PROVIDER_SITE_OTHER): Payer: Medicaid Other | Admitting: Orthopaedic Surgery

## 2020-05-09 DIAGNOSIS — S82122A Displaced fracture of lateral condyle of left tibia, initial encounter for closed fracture: Secondary | ICD-10-CM

## 2020-05-09 MED ORDER — IBUPROFEN 800 MG PO TABS: 800.0000 mg | ORAL_TABLET | Freq: Three times a day (TID) | ORAL | 2 refills | Status: DC | PRN

## 2020-05-09 NOTE — Telephone Encounter (Signed)
Patient called advised the brace does not fit anymore and asked for a call back advising her what to do. The number to contact patient is [phone]
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
## 2020-05-09 NOTE — Telephone Encounter (Signed)
Patient was seen in office today.   Please advise 

## 2020-05-11 NOTE — Telephone Encounter (Signed)
She should come in for nurse visit to have it adjusted.  Thanks.

## 2020-05-12 NOTE — Telephone Encounter (Signed)
She is on the Nurse visit tomorrow at 2 pm. Which brace do I get her?

## 2020-05-12 NOTE — Telephone Encounter (Signed)
She should already have a bledsoe brace.  She just needs to have it adjusted.

## 2020-05-13 ENCOUNTER — Ambulatory Visit: Payer: Medicaid Other

## 2020-05-13 NOTE — Telephone Encounter (Signed)
Patient had a scheduled nurse visit appt today. I called her twice, no answer and she was not in the lobby.

## 2020-05-16 ENCOUNTER — Telehealth: Payer: Self-pay | Admitting: Orthopaedic Surgery

## 2020-05-16 NOTE — Telephone Encounter (Signed)
Called patient no answer could not LVM. She can come in to see lindsey sooner if she likes.

## 2020-05-16 NOTE — Telephone Encounter (Signed)
Pt would like to know if she can wait to change her brace at her appt on 05/23/20; pt would like a CB to further discuss.  [phone]

## 2020-05-21 ENCOUNTER — Ambulatory Visit: Payer: Medicaid Other | Admitting: Podiatry

## 2020-05-23 ENCOUNTER — Ambulatory Visit: Payer: Medicaid Other | Admitting: Orthopaedic Surgery

## 2020-05-29 ENCOUNTER — Ambulatory Visit: Payer: Medicaid Other | Admitting: Orthopaedic Surgery

## 2020-06-02 ENCOUNTER — Ambulatory Visit: Payer: Medicaid Other | Admitting: Podiatry

## 2020-06-18 ENCOUNTER — Emergency Department (HOSPITAL_COMMUNITY)
Admission: EM | Admit: 2020-06-18 | Discharge: 2020-06-18 | Disposition: A | Discharge status: Home / Self Care | Payer: Medicaid Other | Attending: Emergency Medicine | Admitting: Emergency Medicine

## 2020-06-18 ENCOUNTER — Encounter (HOSPITAL_COMMUNITY): Payer: Self-pay | Admitting: Emergency Medicine

## 2020-06-18 DIAGNOSIS — R002 Palpitations: Secondary | ICD-10-CM | POA: Insufficient documentation

## 2020-06-18 DIAGNOSIS — R109 Unspecified abdominal pain: Secondary | ICD-10-CM | POA: Insufficient documentation

## 2020-06-18 DIAGNOSIS — Z5321 Procedure and treatment not carried out due to patient leaving prior to being seen by health care provider: Secondary | ICD-10-CM | POA: Diagnosis not present

## 2020-06-18 LAB — URINALYSIS, ROUTINE W REFLEX MICROSCOPIC
Bacteria, UA: NONE SEEN
Bilirubin Urine: NEGATIVE
Glucose, UA: NEGATIVE mg/dL
Ketones, ur: NEGATIVE mg/dL
Leukocytes,Ua: NEGATIVE
Nitrite: NEGATIVE
Protein, ur: NEGATIVE mg/dL
Specific Gravity, Urine: 1.005 (ref 1.005–1.030)
pH: 5 (ref 5.0–8.0)

## 2020-06-18 LAB — CBC
HCT: 39.4 % (ref 36.0–46.0)
Hemoglobin: 13.2 g/dL (ref 12.0–15.0)
MCH: 29.3 pg (ref 26.0–34.0)
MCHC: 33.5 g/dL (ref 30.0–36.0)
MCV: 87.6 fL (ref 80.0–100.0)
Platelets: 240 10*3/uL (ref 150–400)
RBC: 4.5 MIL/uL (ref 3.87–5.11)
RDW: 13.4 % (ref 11.5–15.5)
WBC: 5.1 10*3/uL (ref 4.0–10.5)
nRBC: 0 % (ref 0.0–0.2)

## 2020-06-18 LAB — COMPREHENSIVE METABOLIC PANEL
ALT: 11 U/L (ref 0–44)
AST: 19 U/L (ref 15–41)
Albumin: 3.8 g/dL (ref 3.5–5.0)
Alkaline Phosphatase: 64 U/L (ref 38–126)
Anion gap: 12 (ref 5–15)
BUN: 8 mg/dL (ref 6–20)
CO2: 24 mmol/L (ref 22–32)
Calcium: 9.3 mg/dL (ref 8.9–10.3)
Chloride: 102 mmol/L (ref 98–111)
Creatinine, Ser: 0.84 mg/dL (ref 0.44–1.00)
GFR calc Af Amer: >60 mL/min (ref >60)
GFR calc non Af Amer: >60 mL/min (ref >60)
Glucose, Bld: 109 mg/dL — ABNORMAL HIGH (ref 70–99)
Potassium: 3.2 mmol/L — ABNORMAL LOW (ref 3.5–5.1)
Sodium: 138 mmol/L (ref 135–145)
Total Bilirubin: 0.9 mg/dL (ref 0.3–1.2)
Total Protein: 6.2 g/dL — ABNORMAL LOW (ref 6.5–8.1)

## 2020-06-18 LAB — I-STAT BETA HCG BLOOD, ED (MC, WL, AP ONLY): I-stat hCG, quantitative: <5 m[IU]/mL (ref <5)

## 2020-06-18 LAB — LIPASE, BLOOD: Lipase: 31 U/L (ref 11–51)

## 2020-06-18 NOTE — ED Notes (Signed)
Pt called to have vitals reassessed. Pt called X 4. No answer. Pt will be moved OTF.

## 2020-06-18 NOTE — ED Triage Notes (Signed)
Patient reports palpitations and abdominal pain X5 years. States "it has worsened over the past 2 weeks."

## 2020-06-18 NOTE — ED Provider Notes (Cosign Needed)
Left without being seen.   Anita Hill, [place] 06/18/20 1339

## 2020-06-19 ENCOUNTER — Emergency Department (HOSPITAL_COMMUNITY)
Admission: EM | Admit: 2020-06-19 | Discharge: 2020-06-19 | Discharge status: Against Medical Advice | Payer: Medicaid Other | Attending: Emergency Medicine | Admitting: Emergency Medicine

## 2020-06-19 NOTE — ED Notes (Signed)
See the paper

## 2020-06-19 NOTE — ED Notes (Signed)
Pt called to have VS documented. No response.

## 2020-06-27 ENCOUNTER — Ambulatory Visit: Payer: Medicaid Other | Admitting: Podiatry

## 2020-07-09 ENCOUNTER — Ambulatory Visit: Payer: Medicaid Other | Admitting: Podiatry
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
## 2020-09-12 ENCOUNTER — Other Ambulatory Visit: Payer: Self-pay

## 2020-09-12 ENCOUNTER — Emergency Department (HOSPITAL_COMMUNITY): Payer: Medicaid Other

## 2020-09-12 ENCOUNTER — Emergency Department (HOSPITAL_COMMUNITY)
Admission: EM | Admit: 2020-09-12 | Discharge: 2020-09-12 | Disposition: A | Discharge status: Home / Self Care | Payer: Medicaid Other | Attending: Emergency Medicine | Admitting: Emergency Medicine

## 2020-09-12 DIAGNOSIS — R301 Vesical tenesmus: Secondary | ICD-10-CM | POA: Diagnosis not present

## 2020-09-12 DIAGNOSIS — R3915 Urgency of urination: Secondary | ICD-10-CM | POA: Insufficient documentation

## 2020-09-12 DIAGNOSIS — R0789 Other chest pain: Secondary | ICD-10-CM | POA: Insufficient documentation

## 2020-09-12 DIAGNOSIS — F17210 Nicotine dependence, cigarettes, uncomplicated: Secondary | ICD-10-CM | POA: Diagnosis not present

## 2020-09-12 DIAGNOSIS — R030 Elevated blood-pressure reading, without diagnosis of hypertension: Secondary | ICD-10-CM | POA: Insufficient documentation

## 2020-09-12 DIAGNOSIS — R350 Frequency of micturition: Secondary | ICD-10-CM | POA: Diagnosis not present

## 2020-09-12 DIAGNOSIS — I10 Essential (primary) hypertension: Secondary | ICD-10-CM | POA: Diagnosis not present

## 2020-09-12 LAB — CBC
HCT: 40.8 % (ref 36.0–46.0)
Hemoglobin: 14.1 g/dL (ref 12.0–15.0)
MCH: 29.9 pg (ref 26.0–34.0)
MCHC: 34.6 g/dL (ref 30.0–36.0)
MCV: 86.4 fL (ref 80.0–100.0)
Platelets: 224 10*3/uL (ref 150–400)
RBC: 4.72 MIL/uL (ref 3.87–5.11)
RDW: 13.7 % (ref 11.5–15.5)
WBC: 6.9 10*3/uL (ref 4.0–10.5)
nRBC: 0 % (ref 0.0–0.2)

## 2020-09-12 LAB — BASIC METABOLIC PANEL
Anion gap: 13 (ref 5–15)
BUN: 16 mg/dL (ref 6–20)
CO2: 25 mmol/L (ref 22–32)
Calcium: 9.5 mg/dL (ref 8.9–10.3)
Chloride: 105 mmol/L (ref 98–111)
Creatinine, Ser: 0.87 mg/dL (ref 0.44–1.00)
GFR, Estimated: >60 mL/min (ref >60)
Glucose, Bld: 86 mg/dL (ref 70–99)
Potassium: 3.5 mmol/L (ref 3.5–5.1)
Sodium: 143 mmol/L (ref 135–145)

## 2020-09-12 LAB — I-STAT BETA HCG BLOOD, ED (MC, WL, AP ONLY): I-stat hCG, quantitative: <5 m[IU]/mL (ref <5)

## 2020-09-12 LAB — URINALYSIS, ROUTINE W REFLEX MICROSCOPIC
Bacteria, UA: NONE SEEN
Bilirubin Urine: NEGATIVE
Glucose, UA: NEGATIVE mg/dL
Ketones, ur: 5 mg/dL — AB
Leukocytes,Ua: NEGATIVE
Nitrite: NEGATIVE
Protein, ur: NEGATIVE mg/dL
Specific Gravity, Urine: 1.014 (ref 1.005–1.030)
pH: 5 (ref 5.0–8.0)

## 2020-09-12 LAB — TROPONIN I (HIGH SENSITIVITY)
Troponin I (High Sensitivity): 4 ng/L (ref <18)
Troponin I (High Sensitivity): 4 ng/L (ref <18)

## 2020-09-12 NOTE — ED Notes (Signed)
AVS discussed with patient as well as s/sx of htn crisis, chest pain, and importance of f/u with PCP. Patient verbalized understanding and has no further questions at this time. Patient has all her belongings w her and ambulating with steady and even gait toward ED exit
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
## 2020-09-12 NOTE — Discharge Instructions (Addendum)
Continue to monitor your blood pressure at home. Keep a record of it and take that record with you when you see your primary care provider.

## 2020-09-12 NOTE — ED Notes (Signed)
In and out cath not performed due to patient ambulating to and from restroom and collecting urine sample by clean catch. Pt calm and cooperative at this time. Vitals stable. PO fluids provided to pt, tolerating well.

## 2020-09-12 NOTE — ED Provider Notes (Signed)
[facility] EMERGENCY DEPARTMENT Provider Note   CSN: [id number] Arrival date & time: 09/12/20  0005   History Chief Complaint  Patient presents with  . Hypertension  . Chest Pain    Anita Hill is a 49 y.o. female.  The history is provided by the patient.  Hypertension Associated symptoms include chest pain.  Chest Pain She is an extremely poor historian, but comes in complaining that her blood pressure was very high at home.  She states that her blood pressure was 191/147.  She tried to bring it down by relaxing but did not feel that it had come down so she came to the ED.  She normally checks her blood pressure twice a day and states that it was slightly elevated yesterday but had been normal the day before.  She is not on any medication for hypertension.  She also relates that she has been having some suprapubic pain for about the last week.  There is associated urinary urgency, frequency, tenesmus but no dysuria.  There is no radiation of that pain.  She has also been having some intermittent chest heaviness.  This will come on randomly and is not related to exertion and will last several hours before resolving.  There is no associated dyspnea, nausea, diaphoresis.  She does smoke black and mild cigars.  There is no history of diabetes, hyperlipidemia.  There is no family history of premature coronary atherosclerosis.  She denies any recent drug use.  Past Medical History:  Diagnosis Date  . BIPOLAR DISORDER   . Delusions ([place])   . Homelessness   . Left ventricular hypertrophy   . Palpitations   . Psychosis ([place])   . Sickle cell trait ([place])   . Substance abuse [facility])     Patient Active Problem List   Diagnosis Date Noted  . Bipolar mixed affective disorder, moderate ([place]) 11/23/2016  . Chest pain 01/20/2016  . Dyspnea 10/13/2012  . Tobacco abuse 07/06/2012  . Palpitations   . Psychosis ([place])   . Congestive heart failure ([place])   . BIPOLAR DISORDER  12/29/2006  . BACK PAIN, LOW 12/29/2006    Past Surgical History:  Procedure Laterality Date  . BUNIONECTOMY       OB History    Gravida  1   Para      Term      Preterm      AB      Living        SAB      TAB      Ectopic      Multiple      Live Births              Family History  Problem Relation Age of Onset  . Heart disease Mother        CHF  . Prostate cancer Father     Social History   Tobacco Use  . Smoking status: Current Every Day Smoker    Packs/day: 1.00    Years: 10.00    Pack years: 10.00    Types: Cigarettes, Cigars  . Smokeless tobacco: Never Used  Vaping Use  . Vaping Use: Never used  Substance Use Topics  . Alcohol use: Yes    Comment: everyday 20-80oz  . Drug use: No    Comment: Previous cocaine, heroin, marijuana    Home Medications Prior to Admission medications   Medication Sig Start Date End Date Taking? Authorizing Provider  diclofenac (VOLTAREN)  50 MG EC tablet Take 1 tablet (50 mg total) by mouth 2 (two) times daily as needed. Patient not taking: Reported on 06/26/2019 06/06/19   Aundra Dubin, PA-C  HYDROcodone-acetaminophen [facility]) 5-325 MG tablet Take 1-2 tablets by mouth daily as needed. 05/06/20   Leandrew Koyanagi, MD  HYDROcodone-acetaminophen (NORCO/VICODIN) 5-325 MG tablet Take 1 tablet by mouth every 6 (six) hours as needed for severe pain. 05/01/20   Caccavale, Sophia, PA-C  ibuprofen (ADVIL) 800 MG tablet Take 1 tablet (800 mg total) by mouth every 8 (eight) hours as needed. 05/09/20   Leandrew Koyanagi, MD  naproxen (NAPROSYN) 375 MG tablet Take 1 tablet (375 mg total) by mouth 2 (two) times daily. 06/04/19   Charlann Lange, PA-C  polyvinyl alcohol (ARTIFICIAL TEARS) 1.4 % ophthalmic solution Place 1 drop into both eyes daily.    [provider]  potassium chloride SA (K-DUR,KLOR-CON) 20 MEQ tablet Take 1 tablet (20 mEq total) by mouth 2 (two) times daily. Patient not taking: Reported on 12/11/2018 01/20/16 05/23/19   Lelon Perla, MD    Allergies    Strawberry extract, Anette Guarneri [lurasidone hcl], and Risperidone and related  Review of Systems   Review of Systems  Cardiovascular: Positive for chest pain.  All other systems reviewed and are negative.   Physical Exam Updated Vital Signs BP 128/89   Pulse 82   Temp 97.7 F (36.5 C) (Oral)   Resp 18   Ht 5\' 2"  (1.575 m)   Wt 63.5 kg   SpO2 99%   BMI 25.61 kg/m   Physical Exam Vitals and nursing note reviewed.   49 year old female, resting comfortably and in no acute distress. Vital signs are significant for elevated blood pressure. Oxygen saturation is 99%, which is normal. Head is normocephalic and atraumatic. PERRLA, EOMI. Oropharynx is clear. Neck is nontender and supple without adenopathy or JVD. Back is nontender and there is no CVA tenderness. Lungs are clear without rales, wheezes, or rhonchi. Chest is nontender. Heart has regular rate and rhythm without murmur. Abdomen is soft, flat, nontender without masses or hepatosplenomegaly and peristalsis is normoactive. Extremities have no cyanosis or edema, full range of motion is present. Skin is warm and dry without rash. Neurologic: Mental status is normal, cranial nerves are intact, there are no motor or sensory deficits.  ED Results / Procedures / Treatments   Labs (all labs ordered are listed, but only abnormal results are displayed) Labs Reviewed  CBC  BASIC METABOLIC PANEL  I-STAT BETA HCG BLOOD, ED ([facility], [facility], [facility] ONLY)  TROPONIN I (HIGH SENSITIVITY)    EKG EKG Interpretation  Date/Time:  Friday September 12 2020 00:12:17 EST Ventricular Rate:  81 PR Interval:  158 QRS Duration: 76 QT Interval:  378 QTC Calculation: 439 R Axis:   73 Text Interpretation: Sinus rhythm with marked sinus arrhythmia Biatrial enlargement Anteroseptal infarct , age undetermined Abnormal ECG When compared with ECG of 06/19/2020, No significant change was found Confirmed by Delora Fuel ([id number])  on 09/12/2020 12:49:30 AM   EKG Interpretation  Date/Time:  Friday September 12 2020 00:43:11 EST Ventricular Rate:  79 PR Interval:  158 QRS Duration: 88 QT Interval:  417 QTC Calculation: 478 R Axis:   72 Text Interpretation: Sinus rhythm Biatrial enlargement Anteroseptal infarct, age indeterminate When compared with ECG of EARLIER SAME DATE No significant change was found Confirmed by Delora Fuel ([id number]) on 09/12/2020 1:14:54 AM       Radiology DG Chest 2  View  Result Date: 09/12/2020 CLINICAL DATA:  Chest pain, hypertension EXAM: CHEST - 2 VIEW COMPARISON:  06/04/2019 FINDINGS: The heart size and mediastinal contours are within normal limits. Both lungs are clear. The visualized skeletal structures are unremarkable. IMPRESSION: No active cardiopulmonary disease. Electronically Signed   By: Fidela Salisbury MD   On: 09/12/2020 00:34    Procedures Procedures  Medications Ordered in ED Medications - No data to display  ED Course  I have reviewed the triage vital signs and the nursing notes.  Pertinent labs & imaging results that were available during my care of the patient were reviewed by me and considered in my medical decision making (see chart for details).  MDM Rules/Calculators/A&P Report of high blood pressure at home.  Blood pressure here is normal to minimally elevated.  Suprapubic pain, suspect UTI.  Will send urine for urinalysis.  Doubt serious pathology such as diverticulitis, appendicitis, aneurysm.  Chest discomfort of uncertain cause.  Pattern is very atypical for coronary disease.  Doubt pulmonary embolism, pericarditis.  Chest x-ray shows no evidence of pneumonia.  ECG is normal.  Initial troponin is normal.  Heart pathway score is 3 which puts her at low risk for major adverse cardiac events in the next 6 weeks.  Old records are reviewed, and she had a normal exercise tolerance test in 2017.  Repeat troponin is normal.  Patient's blood pressure has been mostly  in the normal range while in the ED.  She is referred back to her primary care provider and she is advised to continue monitoring her blood pressure as an outpatient.  Return if symptoms are worsening.  Final Clinical Impression(s) / ED Diagnoses Final diagnoses:  Atypical chest pain  Elevated blood pressure reading without diagnosis of hypertension    Rx / DC Orders ED Discharge Orders    None       Delora Fuel, MD 88/[birth year]/03 [phone]

## 2020-09-12 NOTE — ED Triage Notes (Signed)
Pt said she has been having high BP x 3 days. Pt said some discomfort in her chest. No SOB. Pain stays in the lower abdomen. No N/V. Some dizziness

## 2020-09-12 NOTE — ED Notes (Signed)
ED Provider at bedside. 

## 2020-09-13 ENCOUNTER — Other Ambulatory Visit: Payer: Self-pay

## 2020-09-13 ENCOUNTER — Emergency Department (HOSPITAL_COMMUNITY)
Admission: EM | Admit: 2020-09-13 | Discharge: 2020-09-13 | Disposition: A | Discharge status: Home / Self Care | Payer: Medicaid Other | Attending: Emergency Medicine | Admitting: Emergency Medicine

## 2020-09-13 ENCOUNTER — Encounter (HOSPITAL_COMMUNITY): Payer: Self-pay | Admitting: Emergency Medicine

## 2020-09-13 DIAGNOSIS — R451 Restlessness and agitation: Secondary | ICD-10-CM | POA: Diagnosis not present

## 2020-09-13 DIAGNOSIS — I509 Heart failure, unspecified: Secondary | ICD-10-CM | POA: Insufficient documentation

## 2020-09-13 DIAGNOSIS — F17210 Nicotine dependence, cigarettes, uncomplicated: Secondary | ICD-10-CM | POA: Insufficient documentation

## 2020-09-13 LAB — CBC
HCT: 41.7 % (ref 36.0–46.0)
Hemoglobin: 14.2 g/dL (ref 12.0–15.0)
MCH: 29.5 pg (ref 26.0–34.0)
MCHC: 34.1 g/dL (ref 30.0–36.0)
MCV: 86.5 fL (ref 80.0–100.0)
Platelets: 247 10*3/uL (ref 150–400)
RBC: 4.82 MIL/uL (ref 3.87–5.11)
RDW: 13.7 % (ref 11.5–15.5)
WBC: 7.8 10*3/uL (ref 4.0–10.5)
nRBC: 0 % (ref 0.0–0.2)

## 2020-09-13 LAB — COMPREHENSIVE METABOLIC PANEL
ALT: 25 U/L (ref 0–44)
AST: 29 U/L (ref 15–41)
Albumin: 4.3 g/dL (ref 3.5–5.0)
Alkaline Phosphatase: 72 U/L (ref 38–126)
Anion gap: 13 (ref 5–15)
BUN: 9 mg/dL (ref 6–20)
CO2: 23 mmol/L (ref 22–32)
Calcium: 9.4 mg/dL (ref 8.9–10.3)
Chloride: 105 mmol/L (ref 98–111)
Creatinine, Ser: 0.86 mg/dL (ref 0.44–1.00)
GFR, Estimated: >60 mL/min (ref >60)
Glucose, Bld: 103 mg/dL — ABNORMAL HIGH (ref 70–99)
Potassium: 3.4 mmol/L — ABNORMAL LOW (ref 3.5–5.1)
Sodium: 141 mmol/L (ref 135–145)
Total Bilirubin: 0.5 mg/dL (ref 0.3–1.2)
Total Protein: 6.9 g/dL (ref 6.5–8.1)

## 2020-09-13 LAB — I-STAT BETA HCG BLOOD, ED (MC, WL, AP ONLY): I-stat hCG, quantitative: <5 m[IU]/mL (ref <5)

## 2020-09-13 LAB — ETHANOL: Alcohol, Ethyl (B): 224 mg/dL — ABNORMAL HIGH (ref <10)
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
## 2020-09-13 NOTE — ED Triage Notes (Signed)
Patient requesting detox for her alcoholism , last drink this evening , denies suicidal ideation . No hallucinations .
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
## 2020-09-13 NOTE — Discharge Instructions (Signed)
If you have any psychiatric needs please go to [facility] or behavioral health.  Return to ER if you have thoughts of harming yourself or others.

## 2020-09-14 ENCOUNTER — Emergency Department (HOSPITAL_COMMUNITY)
Admission: EM | Admit: 2020-09-14 | Discharge: 2020-09-14 | Disposition: A | Discharge status: Home / Self Care | Payer: Medicaid Other | Attending: Emergency Medicine | Admitting: Emergency Medicine

## 2020-09-14 ENCOUNTER — Encounter (HOSPITAL_COMMUNITY): Payer: Self-pay | Admitting: Emergency Medicine

## 2020-09-14 DIAGNOSIS — R42 Dizziness and giddiness: Secondary | ICD-10-CM | POA: Insufficient documentation

## 2020-09-14 DIAGNOSIS — F17290 Nicotine dependence, other tobacco product, uncomplicated: Secondary | ICD-10-CM | POA: Insufficient documentation

## 2020-09-14 DIAGNOSIS — Z0000 Encounter for general adult medical examination without abnormal findings: Secondary | ICD-10-CM | POA: Diagnosis not present

## 2020-09-14 DIAGNOSIS — I509 Heart failure, unspecified: Secondary | ICD-10-CM | POA: Insufficient documentation

## 2020-09-14 DIAGNOSIS — F17210 Nicotine dependence, cigarettes, uncomplicated: Secondary | ICD-10-CM | POA: Diagnosis not present

## 2020-09-14 DIAGNOSIS — Z139 Encounter for screening, unspecified: Secondary | ICD-10-CM

## 2020-09-14 NOTE — ED Provider Notes (Signed)
[facility] EMERGENCY DEPARTMENT Provider Note   CSN: [id number] Arrival date & time: 09/14/20  0124     History Chief Complaint  Patient presents with  . Alcohol Problem    Anita Hill is a 49 y.o. female.  Patient with history of bipolar, delusions, homelessness, palpitations, psychosis, and substance abuse presents to the emergency department with a chief complaint of dizziness.  She states that she initially checked in yesterday for alcohol intoxication and requesting help with this.  She became aggressive with the staff, and was escorted off the property.  She states that she never completed her evaluation, and would like to finish now.  She states that she occasionally has heart palpitations and dizziness.  Denies any treatments prior to arrival.  Nothing makes his symptoms better or worse.  The history is provided by the patient. No language interpreter was used.       Past Medical History:  Diagnosis Date  . BIPOLAR DISORDER   . Delusions ([place])   . Homelessness   . Left ventricular hypertrophy   . Palpitations   . Psychosis ([place])   . Sickle cell trait ([place])   . Substance abuse [facility])     Patient Active Problem List   Diagnosis Date Noted  . Bipolar mixed affective disorder, moderate ([place]) 11/23/2016  . Chest pain 01/20/2016  . Dyspnea 10/13/2012  . Tobacco abuse 07/06/2012  . Palpitations   . Psychosis ([place])   . Congestive heart failure ([place])   . BIPOLAR DISORDER 12/29/2006  . BACK PAIN, LOW 12/29/2006    Past Surgical History:  Procedure Laterality Date  . BUNIONECTOMY       OB History    Gravida  1   Para      Term      Preterm      AB      Living        SAB      TAB      Ectopic      Multiple      Live Births              Family History  Problem Relation Age of Onset  . Heart disease Mother        CHF  . Prostate cancer Father     Social History   Tobacco Use  . Smoking status: Current Every  Day Smoker    Packs/day: 1.00    Years: 10.00    Pack years: 10.00    Types: Cigarettes, Cigars  . Smokeless tobacco: Never Used  Vaping Use  . Vaping Use: Never used  Substance Use Topics  . Alcohol use: Yes    Comment: everyday 20-80oz  . Drug use: No    Comment: Previous cocaine, heroin, marijuana    Home Medications Prior to Admission medications   Medication Sig Start Date End Date Taking? Authorizing Provider  ibuprofen (ADVIL) 800 MG tablet Take 1 tablet (800 mg total) by mouth every 8 (eight) hours as needed. 05/09/20   Leandrew Koyanagi, MD  naproxen (NAPROSYN) 375 MG tablet Take 1 tablet (375 mg total) by mouth 2 (two) times daily. 06/04/19   Charlann Lange, PA-C  polyvinyl alcohol (ARTIFICIAL TEARS) 1.4 % ophthalmic solution Place 1 drop into both eyes daily.    [provider]  potassium chloride SA (K-DUR,KLOR-CON) 20 MEQ tablet Take 1 tablet (20 mEq total) by mouth 2 (two) times daily. Patient not taking: Reported on 12/11/2018 01/20/16 05/23/19  Lelon Perla, MD    Allergies    Strawberry extract, Anette Guarneri [lurasidone hcl], and Risperidone and related  Review of Systems   Review of Systems  All other systems reviewed and are negative.   Physical Exam Updated Vital Signs BP 130/88   Pulse 78   Temp 97.9 F (36.6 C) (Oral)   Resp 18   SpO2 98%   Physical Exam Vitals and nursing note reviewed.  Constitutional:      General: She is not in acute distress.    Appearance: She is well-developed.  HENT:     Head: Normocephalic and atraumatic.  Eyes:     Conjunctiva/sclera: Conjunctivae normal.  Cardiovascular:     Rate and Rhythm: Normal rate.     Heart sounds: No murmur heard.   Pulmonary:     Effort: Pulmonary effort is normal. No respiratory distress.  Abdominal:     General: There is no distension.  Musculoskeletal:     Cervical back: Neck supple.     Comments: Moves all extremities  Skin:    General: Skin is warm and dry.  Neurological:      Mental Status: She is alert and oriented to person, place, and time.  Psychiatric:        Mood and Affect: Mood normal.        Behavior: Behavior normal.     ED Results / Procedures / Treatments   Labs (all labs ordered are listed, but only abnormal results are displayed) Labs Reviewed - No data to display  EKG None  Radiology No results found.  Procedures Procedures (including critical care time)  Medications Ordered in ED Medications - No data to display  ED Course  I have reviewed the triage vital signs and the nursing notes.  Pertinent labs & imaging results that were available during my care of the patient were reviewed by me and considered in my medical decision making (see chart for details).    MDM Rules/Calculators/A&P                          Patient here complaining of dizziness.  As I talked to her further, she states that she was seen here earlier, but was escorted off the property after threatening behavior.  She states that she wanted to complete her work-up.  I did review all of her labs that were ordered and completed last night.  There are no concerning findings.  Patient's vital signs are stable.  She is in no acute distress.  Question secondary gain.  At this time, do not think patient needs any further emergent work-up or treatment.   Final Clinical Impression(s) / ED Diagnoses Final diagnoses:  Encounter for medical screening examination    Rx / DC Orders ED Discharge Orders    None       Montine Circle, PA-C 63/84/53 [id number]    Delora Fuel, MD 01/19/21 [phone]
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
## 2020-10-17 ENCOUNTER — Encounter (HOSPITAL_COMMUNITY): Payer: Self-pay | Admitting: Emergency Medicine

## 2020-10-17 ENCOUNTER — Emergency Department (HOSPITAL_COMMUNITY)
Admission: EM | Admit: 2020-10-17 | Discharge: 2020-10-17 | Disposition: A | Discharge status: Home / Self Care | Payer: Medicaid Other | Attending: Emergency Medicine | Admitting: Emergency Medicine

## 2020-10-17 ENCOUNTER — Other Ambulatory Visit: Payer: Self-pay

## 2020-10-17 DIAGNOSIS — M5431 Sciatica, right side: Secondary | ICD-10-CM | POA: Diagnosis not present

## 2020-10-17 DIAGNOSIS — M79605 Pain in left leg: Secondary | ICD-10-CM | POA: Insufficient documentation

## 2020-10-17 LAB — URINALYSIS, ROUTINE W REFLEX MICROSCOPIC
Bacteria, UA: NONE SEEN
Bilirubin Urine: NEGATIVE
Glucose, UA: NEGATIVE mg/dL
Ketones, ur: NEGATIVE mg/dL
Leukocytes,Ua: NEGATIVE
Nitrite: NEGATIVE
Protein, ur: NEGATIVE mg/dL
Specific Gravity, Urine: 1.016 (ref 1.005–1.030)
pH: 5 (ref 5.0–8.0)

## 2020-10-17 LAB — COMPREHENSIVE METABOLIC PANEL
ALT: 15 U/L (ref 0–44)
AST: 21 U/L (ref 15–41)
Albumin: 4.1 g/dL (ref 3.5–5.0)
Alkaline Phosphatase: 60 U/L (ref 38–126)
Anion gap: 11 (ref 5–15)
BUN: 15 mg/dL (ref 6–20)
CO2: 25 mmol/L (ref 22–32)
Calcium: 9.7 mg/dL (ref 8.9–10.3)
Chloride: 101 mmol/L (ref 98–111)
Creatinine, Ser: 0.9 mg/dL (ref 0.44–1.00)
GFR, Estimated: >60 mL/min (ref >60)
Glucose, Bld: 93 mg/dL (ref 70–99)
Potassium: 3.4 mmol/L — ABNORMAL LOW (ref 3.5–5.1)
Sodium: 137 mmol/L (ref 135–145)
Total Bilirubin: 0.7 mg/dL (ref 0.3–1.2)
Total Protein: 6.4 g/dL — ABNORMAL LOW (ref 6.5–8.1)

## 2020-10-17 LAB — CBC WITH DIFFERENTIAL/PLATELET
Abs Immature Granulocytes: 0.03 10*3/uL (ref 0.00–0.07)
Basophils Absolute: 0 10*3/uL (ref 0.0–0.1)
Basophils Relative: 0 %
Eosinophils Absolute: 0.2 10*3/uL (ref 0.0–0.5)
Eosinophils Relative: 3 %
HCT: 40.6 % (ref 36.0–46.0)
Hemoglobin: 13.9 g/dL (ref 12.0–15.0)
Immature Granulocytes: 0 %
Lymphocytes Relative: 43 %
Lymphs Abs: 2.9 10*3/uL (ref 0.7–4.0)
MCH: 29.4 pg (ref 26.0–34.0)
MCHC: 34.2 g/dL (ref 30.0–36.0)
MCV: 86 fL (ref 80.0–100.0)
Monocytes Absolute: 0.6 10*3/uL (ref 0.1–1.0)
Monocytes Relative: 9 %
Neutro Abs: 3 10*3/uL (ref 1.7–7.7)
Neutrophils Relative %: 45 %
Platelets: 265 10*3/uL (ref 150–400)
RBC: 4.72 MIL/uL (ref 3.87–5.11)
RDW: 14.1 % (ref 11.5–15.5)
WBC: 6.8 10*3/uL (ref 4.0–10.5)
nRBC: 0 % (ref 0.0–0.2)

## 2020-10-17 NOTE — ED Triage Notes (Signed)
Patient with right sciatica and leg pain.  Patient states that she has had a cast on the left leg, it is now too big and has not been getting a new one and now having right leg pain.  Also states that she has left side pain, thinks she may have pancreatic CA.

## 2020-10-17 NOTE — ED Notes (Addendum)
Pt notified Jessica(Registration) that pt could not wait any longer and knows we are trying to do the best we can, but will come back later. Pt seen leaving.

## 2020-10-20 ENCOUNTER — Encounter (HOSPITAL_COMMUNITY): Payer: Self-pay | Admitting: *Deleted

## 2020-10-20 ENCOUNTER — Emergency Department (HOSPITAL_COMMUNITY)
Admission: EM | Admit: 2020-10-20 | Discharge: 2020-10-20 | Disposition: A | Discharge status: Home / Self Care | Payer: Medicaid Other | Attending: Emergency Medicine | Admitting: Emergency Medicine

## 2020-10-20 ENCOUNTER — Other Ambulatory Visit: Payer: Self-pay

## 2020-10-20 DIAGNOSIS — M79604 Pain in right leg: Secondary | ICD-10-CM | POA: Diagnosis present

## 2020-10-20 DIAGNOSIS — R252 Cramp and spasm: Secondary | ICD-10-CM | POA: Diagnosis not present

## 2020-10-20 DIAGNOSIS — Z5321 Procedure and treatment not carried out due to patient leaving prior to being seen by health care provider: Secondary | ICD-10-CM | POA: Diagnosis not present

## 2020-10-20 DIAGNOSIS — R2242 Localized swelling, mass and lump, left lower limb: Secondary | ICD-10-CM | POA: Insufficient documentation

## 2020-10-20 DIAGNOSIS — M79605 Pain in left leg: Secondary | ICD-10-CM | POA: Insufficient documentation

## 2020-10-20 DIAGNOSIS — G8929 Other chronic pain: Secondary | ICD-10-CM | POA: Diagnosis not present

## 2020-10-20 MED ORDER — IBUPROFEN 400 MG PO TABS
400.0000 mg | ORAL_TABLET | Freq: Once | ORAL | Status: AC | PRN
  Administered 2020-10-20: 400 mg via ORAL
  Filled 2020-10-20: qty 1

## 2020-10-20 NOTE — ED Triage Notes (Signed)
Pt was here on 12/17 but lwbs. Reports hx of left leg injury with chronic pain and swelling. Now has ongoing right leg pain with spasms, pain radiating up into her back. Normally takes ibuprofen but has ran out. Denies new injury.

## 2020-11-05 ENCOUNTER — Emergency Department (HOSPITAL_COMMUNITY)
Admission: EM | Admit: 2020-11-05 | Discharge: 2020-11-05 | Disposition: A | Discharge status: Home / Self Care | Payer: Medicaid Other | Attending: Emergency Medicine | Admitting: Emergency Medicine

## 2020-11-05 ENCOUNTER — Encounter (HOSPITAL_COMMUNITY): Payer: Self-pay

## 2020-11-05 DIAGNOSIS — M25562 Pain in left knee: Secondary | ICD-10-CM | POA: Diagnosis not present

## 2020-11-05 DIAGNOSIS — T7421XA Adult sexual abuse, confirmed, initial encounter: Secondary | ICD-10-CM | POA: Diagnosis not present

## 2020-11-05 DIAGNOSIS — F17210 Nicotine dependence, cigarettes, uncomplicated: Secondary | ICD-10-CM | POA: Insufficient documentation

## 2020-11-05 DIAGNOSIS — F17290 Nicotine dependence, other tobacco product, uncomplicated: Secondary | ICD-10-CM | POA: Insufficient documentation

## 2020-11-05 DIAGNOSIS — I509 Heart failure, unspecified: Secondary | ICD-10-CM | POA: Insufficient documentation

## 2020-11-05 DIAGNOSIS — Z20822 Contact with and (suspected) exposure to covid-19: Secondary | ICD-10-CM | POA: Insufficient documentation

## 2020-11-05 DIAGNOSIS — F1020 Alcohol dependence, uncomplicated: Secondary | ICD-10-CM

## 2020-11-05 DIAGNOSIS — R0902 Hypoxemia: Secondary | ICD-10-CM | POA: Diagnosis not present

## 2020-11-05 LAB — POC URINE PREG, ED: Preg Test, Ur: NEGATIVE

## 2020-11-05 LAB — POC SARS CORONAVIRUS 2 AG -  ED: SARS Coronavirus 2 Ag: NEGATIVE

## 2020-11-05 MED ORDER — CEFTRIAXONE SODIUM 1 G IJ SOLR
500.0000 mg | Freq: Once | INTRAMUSCULAR | Status: AC
  Administered 2020-11-05: 500 mg via INTRAMUSCULAR
  Filled 2020-11-05: qty 10

## 2020-11-05 MED ORDER — METRONIDAZOLE 500 MG PO TABS
2000.0000 mg | ORAL_TABLET | Freq: Once | ORAL | Status: AC
  Administered 2020-11-05: 2000 mg via ORAL
  Filled 2020-11-05: qty 4

## 2020-11-05 MED ORDER — ULIPRISTAL ACETATE 30 MG PO TABS
30.0000 mg | ORAL_TABLET | Freq: Once | ORAL | Status: AC
  Administered 2020-11-05: 30 mg via ORAL
  Filled 2020-11-05: qty 1

## 2020-11-05 MED ORDER — LIDOCAINE HCL (PF) 1 % IJ SOLN
1.0000 mL | Freq: Once | INTRAMUSCULAR | Status: AC
  Administered 2020-11-05: 1 mL
  Filled 2020-11-05: qty 30

## 2020-11-05 MED ORDER — AZITHROMYCIN 250 MG PO TABS
1000.0000 mg | ORAL_TABLET | Freq: Once | ORAL | Status: AC
  Administered 2020-11-05: 1000 mg via ORAL
  Filled 2020-11-05: qty 4
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
## 2020-11-05 NOTE — ED Notes (Addendum)
Pt verbalized dc instructions and follow up care. Alert and ambulatory. No IV. Provided shelter information by Mariea Stable, RN

## 2020-11-05 NOTE — Discharge Instructions (Signed)
Follow-up for treatment of alcoholism with one of the listed facilities.
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
## 2020-11-10 ENCOUNTER — Encounter (HOSPITAL_COMMUNITY): Payer: Self-pay | Admitting: Emergency Medicine

## 2020-11-10 ENCOUNTER — Emergency Department (HOSPITAL_COMMUNITY)
Admission: EM | Admit: 2020-11-10 | Discharge: 2020-11-10 | Disposition: A | Discharge status: Home / Self Care | Payer: Medicaid Other | Attending: Emergency Medicine | Admitting: Emergency Medicine

## 2020-11-10 DIAGNOSIS — M25562 Pain in left knee: Secondary | ICD-10-CM | POA: Diagnosis not present

## 2020-11-10 DIAGNOSIS — Z5321 Procedure and treatment not carried out due to patient leaving prior to being seen by health care provider: Secondary | ICD-10-CM | POA: Diagnosis not present

## 2020-11-10 NOTE — ED Triage Notes (Signed)
Pt c/o recurring L knee pain, pt states she needs a new brace d/t increase in walking for work. Previously fitted by ortho.

## 2020-11-10 NOTE — ED Notes (Signed)
Pt did not want to stay Pt left and said she would come back in the am.

## 2020-11-11 ENCOUNTER — Encounter (HOSPITAL_COMMUNITY): Payer: Self-pay

## 2020-11-11 ENCOUNTER — Emergency Department (HOSPITAL_COMMUNITY): Payer: Medicaid Other

## 2020-11-11 ENCOUNTER — Emergency Department (HOSPITAL_COMMUNITY)
Admission: EM | Admit: 2020-11-11 | Discharge: 2020-11-12 | Disposition: A | Discharge status: Home / Self Care | Payer: Medicaid Other | Source: Home / Self Care | Attending: Emergency Medicine | Admitting: Emergency Medicine

## 2020-11-11 ENCOUNTER — Other Ambulatory Visit: Payer: Self-pay

## 2020-11-11 ENCOUNTER — Emergency Department (HOSPITAL_COMMUNITY)
Admission: EM | Admit: 2020-11-11 | Discharge: 2020-11-11 | Discharge status: Against Medical Advice | Payer: Medicaid Other | Attending: Emergency Medicine | Admitting: Emergency Medicine

## 2020-11-11 DIAGNOSIS — S8992XA Unspecified injury of left lower leg, initial encounter: Secondary | ICD-10-CM | POA: Diagnosis not present

## 2020-11-11 DIAGNOSIS — D573 Sickle-cell trait: Secondary | ICD-10-CM | POA: Insufficient documentation

## 2020-11-11 DIAGNOSIS — I509 Heart failure, unspecified: Secondary | ICD-10-CM | POA: Insufficient documentation

## 2020-11-11 DIAGNOSIS — S82142D Displaced bicondylar fracture of left tibia, subsequent encounter for closed fracture with routine healing: Secondary | ICD-10-CM | POA: Insufficient documentation

## 2020-11-11 DIAGNOSIS — F22 Delusional disorders: Secondary | ICD-10-CM | POA: Insufficient documentation

## 2020-11-11 DIAGNOSIS — Z79899 Other long term (current) drug therapy: Secondary | ICD-10-CM | POA: Diagnosis not present

## 2020-11-11 DIAGNOSIS — R52 Pain, unspecified: Secondary | ICD-10-CM

## 2020-11-11 DIAGNOSIS — S82142G Displaced bicondylar fracture of left tibia, subsequent encounter for closed fracture with delayed healing: Secondary | ICD-10-CM | POA: Insufficient documentation

## 2020-11-11 DIAGNOSIS — S82142A Displaced bicondylar fracture of left tibia, initial encounter for closed fracture: Secondary | ICD-10-CM | POA: Diagnosis not present

## 2020-11-11 DIAGNOSIS — F17210 Nicotine dependence, cigarettes, uncomplicated: Secondary | ICD-10-CM | POA: Insufficient documentation

## 2020-11-11 DIAGNOSIS — M25562 Pain in left knee: Secondary | ICD-10-CM | POA: Diagnosis not present

## 2020-11-11 LAB — I-STAT BETA HCG BLOOD, ED (MC, WL, AP ONLY): I-stat hCG, quantitative: <5 m[IU]/mL (ref <5)

## 2020-11-11 MED ORDER — ACETAMINOPHEN 500 MG PO TABS
1000.0000 mg | ORAL_TABLET | Freq: Once | ORAL | Status: AC
  Administered 2020-11-11: 1000 mg via ORAL
  Filled 2020-11-11: qty 2

## 2020-11-11 MED ORDER — IBUPROFEN 400 MG PO TABS
400.0000 mg | ORAL_TABLET | Freq: Once | ORAL | Status: AC
  Administered 2020-11-11: 400 mg via ORAL
  Filled 2020-11-11: qty 1

## 2020-11-11 MED ORDER — IBUPROFEN 800 MG PO TABS: 800.0000 mg | ORAL_TABLET | Freq: Three times a day (TID) | ORAL | 0 refills | Status: DC | PRN

## 2020-11-11 NOTE — ED Notes (Signed)
Pt called for a room, no answer ?

## 2020-11-11 NOTE — ED Triage Notes (Signed)
Pt states that she has L knee pain and needs a new brace, here last night and LWBS. Pt also c/o of L sided sciatic pain
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
## 2020-11-11 NOTE — ED Notes (Signed)
Pgd IM to Dr Zollie Pee
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
## 2020-11-12 NOTE — Progress Notes (Signed)
Orthopedic Tech Progress Note Patient Details:  Anita Hill 09/02/1971 [id number]  Ortho Devices Type of Ortho Device: Knee Immobilizer Ortho Device/Splint Location: LLE Ortho Device/Splint Interventions: Ordered,Application,Adjustment   Post Interventions Patient Tolerated: Well Instructions Provided: Care of device,Adjustment of device,Poper ambulation with device   Enis Leatherwood 11/12/2020, 1:11 AM

## 2020-11-29 ENCOUNTER — Encounter (HOSPITAL_COMMUNITY): Payer: Self-pay | Admitting: Emergency Medicine

## 2020-11-29 ENCOUNTER — Other Ambulatory Visit: Payer: Self-pay

## 2020-11-29 ENCOUNTER — Emergency Department (HOSPITAL_COMMUNITY)
Admission: EM | Admit: 2020-11-29 | Discharge: 2020-11-29 | Disposition: A | Discharge status: Home / Self Care | Payer: Medicaid Other | Attending: Emergency Medicine | Admitting: Emergency Medicine

## 2020-11-29 DIAGNOSIS — F17210 Nicotine dependence, cigarettes, uncomplicated: Secondary | ICD-10-CM | POA: Insufficient documentation

## 2020-11-29 DIAGNOSIS — F17290 Nicotine dependence, other tobacco product, uncomplicated: Secondary | ICD-10-CM | POA: Insufficient documentation

## 2020-11-29 DIAGNOSIS — S01511A Laceration without foreign body of lip, initial encounter: Secondary | ICD-10-CM

## 2020-11-29 DIAGNOSIS — S01501A Unspecified open wound of lip, initial encounter: Secondary | ICD-10-CM | POA: Diagnosis present

## 2020-11-29 DIAGNOSIS — Z23 Encounter for immunization: Secondary | ICD-10-CM | POA: Insufficient documentation

## 2020-11-29 DIAGNOSIS — I509 Heart failure, unspecified: Secondary | ICD-10-CM | POA: Diagnosis not present

## 2020-11-29 MED ORDER — TETANUS-DIPHTH-ACELL PERTUSSIS 5-2.5-18.5 LF-MCG/0.5 IM SUSY
0.5000 mL | PREFILLED_SYRINGE | Freq: Once | INTRAMUSCULAR | Status: AC
  Administered 2020-11-29: 0.5 mL via INTRAMUSCULAR
  Filled 2020-11-29: qty 0.5

## 2020-11-29 NOTE — Discharge Instructions (Signed)
Rinse your mouth out after meals.  Apply ice for 15 minutes at a time to help with swelling. Follow with your dentist if your teeth continue to hurt.
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
## 2020-12-15 ENCOUNTER — Emergency Department (HOSPITAL_COMMUNITY)
Admission: EM | Admit: 2020-12-15 | Discharge: 2020-12-15 | Disposition: A | Discharge status: Home / Self Care | Payer: Medicaid Other | Attending: Emergency Medicine | Admitting: Emergency Medicine

## 2020-12-15 ENCOUNTER — Encounter (HOSPITAL_COMMUNITY): Payer: Self-pay | Admitting: Emergency Medicine

## 2020-12-15 ENCOUNTER — Other Ambulatory Visit: Payer: Self-pay

## 2020-12-15 DIAGNOSIS — Z5321 Procedure and treatment not carried out due to patient leaving prior to being seen by health care provider: Secondary | ICD-10-CM | POA: Insufficient documentation

## 2020-12-15 DIAGNOSIS — M25562 Pain in left knee: Secondary | ICD-10-CM | POA: Insufficient documentation
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
## 2020-12-15 NOTE — ED Triage Notes (Signed)
Patient from home. Complaint of ongoing issue with her heart and left knee pain. Patient unwilling to elaborate in triage.

## 2021-01-12 DIAGNOSIS — F1024 Alcohol dependence with alcohol-induced mood disorder: Secondary | ICD-10-CM | POA: Insufficient documentation

## 2021-01-12 DIAGNOSIS — F1020 Alcohol dependence, uncomplicated: Secondary | ICD-10-CM | POA: Insufficient documentation

## 2021-01-22 ENCOUNTER — Emergency Department (HOSPITAL_COMMUNITY)
Admission: EM | Admit: 2021-01-22 | Discharge: 2021-01-23 | Disposition: A | Discharge status: Home / Self Care | Payer: Medicaid Other | Attending: Emergency Medicine | Admitting: Emergency Medicine

## 2021-01-22 ENCOUNTER — Other Ambulatory Visit: Payer: Self-pay

## 2021-01-22 DIAGNOSIS — R600 Localized edema: Secondary | ICD-10-CM

## 2021-01-22 DIAGNOSIS — R002 Palpitations: Secondary | ICD-10-CM | POA: Insufficient documentation

## 2021-01-22 DIAGNOSIS — R9431 Abnormal electrocardiogram [ECG] [EKG]: Secondary | ICD-10-CM | POA: Diagnosis not present

## 2021-01-22 DIAGNOSIS — M7989 Other specified soft tissue disorders: Secondary | ICD-10-CM | POA: Diagnosis present

## 2021-01-22 DIAGNOSIS — R5383 Other fatigue: Secondary | ICD-10-CM | POA: Diagnosis not present

## 2021-01-22 DIAGNOSIS — F17210 Nicotine dependence, cigarettes, uncomplicated: Secondary | ICD-10-CM | POA: Insufficient documentation

## 2021-01-22 DIAGNOSIS — R609 Edema, unspecified: Secondary | ICD-10-CM | POA: Diagnosis not present

## 2021-01-22 DIAGNOSIS — I509 Heart failure, unspecified: Secondary | ICD-10-CM | POA: Insufficient documentation

## 2021-01-22 DIAGNOSIS — R2243 Localized swelling, mass and lump, lower limb, bilateral: Secondary | ICD-10-CM | POA: Diagnosis not present

## 2021-01-22 NOTE — ED Triage Notes (Signed)
Feels fatigue for more than a month now with runny nose. Pt states "I was in an assessment and was that I have to be seen, so I came here so you can tell me whats wrong".  Pt reports blurred vision and swelling of both feet.

## 2021-01-23 ENCOUNTER — Emergency Department (HOSPITAL_COMMUNITY): Payer: Medicaid Other

## 2021-01-23 DIAGNOSIS — R5383 Other fatigue: Secondary | ICD-10-CM | POA: Diagnosis not present

## 2021-01-23 LAB — URINALYSIS, ROUTINE W REFLEX MICROSCOPIC
Bacteria, UA: NONE SEEN
Bilirubin Urine: NEGATIVE
Glucose, UA: NEGATIVE mg/dL
Ketones, ur: NEGATIVE mg/dL
Leukocytes,Ua: NEGATIVE
Nitrite: NEGATIVE
Protein, ur: NEGATIVE mg/dL
Specific Gravity, Urine: 1.01 (ref 1.005–1.030)
pH: 5 (ref 5.0–8.0)

## 2021-01-23 LAB — CBC
HCT: 37.9 % (ref 36.0–46.0)
Hemoglobin: 13 g/dL (ref 12.0–15.0)
MCH: 29.9 pg (ref 26.0–34.0)
MCHC: 34.3 g/dL (ref 30.0–36.0)
MCV: 87.1 fL (ref 80.0–100.0)
Platelets: 234 10*3/uL (ref 150–400)
RBC: 4.35 MIL/uL (ref 3.87–5.11)
RDW: 13.6 % (ref 11.5–15.5)
WBC: 6.2 10*3/uL (ref 4.0–10.5)
nRBC: 0 % (ref 0.0–0.2)

## 2021-01-23 LAB — I-STAT BETA HCG BLOOD, ED (MC, WL, AP ONLY): I-stat hCG, quantitative: <5 m[IU]/mL (ref <5)

## 2021-01-23 LAB — CBG MONITORING, ED: Glucose-Capillary: 74 mg/dL (ref 70–99)

## 2021-01-23 LAB — BASIC METABOLIC PANEL
Anion gap: 11 (ref 5–15)
BUN: 20 mg/dL (ref 6–20)
CO2: 23 mmol/L (ref 22–32)
Calcium: 9.2 mg/dL (ref 8.9–10.3)
Chloride: 107 mmol/L (ref 98–111)
Creatinine, Ser: 0.93 mg/dL (ref 0.44–1.00)
GFR, Estimated: >60 mL/min (ref >60)
Glucose, Bld: 102 mg/dL — ABNORMAL HIGH (ref 70–99)
Potassium: 3.1 mmol/L — ABNORMAL LOW (ref 3.5–5.1)
Sodium: 141 mmol/L (ref 135–145)

## 2021-01-23 NOTE — ED Notes (Signed)
Pt returned from radiology.

## 2021-01-23 NOTE — ED Notes (Signed)
Pt given juice

## 2021-01-23 NOTE — ED Notes (Signed)
Attempted to discharge pt. Pt states she needs to speak to the doctor before she leaves. Dr. Betsey Holiday made aware.

## 2021-01-23 NOTE — ED Provider Notes (Signed)
[facility] EMERGENCY DEPARTMENT Provider Note   CSN: [id number] Arrival date & time: 01/22/21  2323     History Chief Complaint  Patient presents with  . Fatigue  . Leg Swelling    Anita Hill is a 50 y.o. female.  Patient presents to the emergency department with complaints of at least 1 month of swelling of both of her feet and increased fatigue.  Patient does have intermittent palpitations.  She reports that she previously saw Dr. Stanford Breed for this but has not followed up in some time.  She tells me she was trying to get involved in a drug and alcohol counseling program and to get housing, was told she needed to come and get medical evaluation during this process.  No associated chest pain.        Past Medical History:  Diagnosis Date  . BIPOLAR DISORDER   . Delusions ([place])   . Homelessness   . Left ventricular hypertrophy   . Palpitations   . Psychosis ([place])   . Sickle cell trait ([place])   . Substance abuse [facility])     Patient Active Problem List   Diagnosis Date Noted  . Bipolar mixed affective disorder, moderate ([place]) 11/23/2016  . Chest pain 01/20/2016  . Dyspnea 10/13/2012  . Tobacco abuse 07/06/2012  . Palpitations   . Psychosis ([place])   . Congestive heart failure ([place])   . BIPOLAR DISORDER 12/29/2006  . BACK PAIN, LOW 12/29/2006    Past Surgical History:  Procedure Laterality Date  . BUNIONECTOMY       OB History    Gravida  1   Para      Term      Preterm      AB      Living        SAB      IAB      Ectopic      Multiple      Live Births              Family History  Problem Relation Age of Onset  . Heart disease Mother        CHF  . Prostate cancer Father     Social History   Tobacco Use  . Smoking status: Current Every Day Smoker    Packs/day: 1.00    Years: 10.00    Pack years: 10.00    Types: Cigarettes, Cigars  . Smokeless tobacco: Never Used  Vaping Use  . Vaping Use: Never used   Substance Use Topics  . Alcohol use: Yes    Comment: everyday 20-80oz  . Drug use: No    Comment: Previous cocaine, heroin, marijuana    Home Medications Prior to Admission medications   Medication Sig Start Date End Date Taking? Authorizing Provider  ibuprofen (ADVIL) 800 MG tablet Take 1 tablet (800 mg total) by mouth every 8 (eight) hours as needed. 11/11/20  Yes Blanchie Dessert, MD  naproxen (NAPROSYN) 375 MG tablet Take 1 tablet (375 mg total) by mouth 2 (two) times daily. 06/04/19  Yes Upstill, Nehemiah Settle, PA-C  polyvinyl alcohol (LIQUIFILM TEARS) 1.4 % ophthalmic solution Place 1 drop into both eyes daily.   Yes [provider]  potassium chloride SA (K-DUR,KLOR-CON) 20 MEQ tablet Take 1 tablet (20 mEq total) by mouth 2 (two) times daily. Patient not taking: Reported on 12/11/2018 01/20/16 05/23/19  Lelon Perla, MD    Allergies    Strawberry extract, Risperidone and related,  and Latuda [lurasidone hcl]  Review of Systems   Review of Systems  Cardiovascular: Positive for palpitations and leg swelling.  All other systems reviewed and are negative.   Physical Exam Updated Vital Signs BP 109/71   Pulse 77   Temp 98 F (36.7 C) (Oral)   Resp 16   Ht 5\' 2"  (1.575 m)   Wt 61 kg   SpO2 96%   BMI 24.60 kg/m   Physical Exam Vitals and nursing note reviewed.  Constitutional:      General: She is not in acute distress.    Appearance: Normal appearance. She is well-developed.  HENT:     Head: Normocephalic and atraumatic.     Right Ear: Hearing normal.     Left Ear: Hearing normal.     Nose: Nose normal.  Eyes:     Conjunctiva/sclera: Conjunctivae normal.     Pupils: Pupils are equal, round, and reactive to light.  Cardiovascular:     Rate and Rhythm: Regular rhythm.     Heart sounds: S1 normal and S2 normal. No murmur heard. No friction rub. No gallop.   Pulmonary:     Effort: Pulmonary effort is normal. No respiratory distress.     Breath sounds: Normal  breath sounds.  Chest:     Chest wall: No tenderness.  Abdominal:     General: Bowel sounds are normal.     Palpations: Abdomen is soft.     Tenderness: There is no abdominal tenderness. There is no guarding or rebound. Negative signs include Murphy's sign and McBurney's sign.     Hernia: No hernia is present.  Musculoskeletal:        General: Normal range of motion.     Cervical back: Normal range of motion and neck supple.     Right lower leg: Edema (Trace pedal) present.     Left lower leg: Edema (Trace pedal) present.  Skin:    General: Skin is warm and dry.     Findings: No rash.  Neurological:     Mental Status: She is alert and oriented to person, place, and time.     GCS: GCS eye subscore is 4. GCS verbal subscore is 5. GCS motor subscore is 6.     Cranial Nerves: No cranial nerve deficit.     Sensory: No sensory deficit.     Coordination: Coordination normal.  Psychiatric:        Speech: Speech normal.        Behavior: Behavior normal.        Thought Content: Thought content normal.     ED Results / Procedures / Treatments   Labs (all labs ordered are listed, but only abnormal results are displayed) Labs Reviewed  BASIC METABOLIC PANEL - Abnormal; Notable for the following components:      Result Value   Potassium 3.1 (*)    Glucose, Bld 102 (*)    All other components within normal limits  URINALYSIS, ROUTINE W REFLEX MICROSCOPIC - Abnormal; Notable for the following components:   Color, Urine STRAW (*)    Hgb urine dipstick MODERATE (*)    All other components within normal limits  CBC  BRAIN NATRIURETIC PEPTIDE  CBG MONITORING, ED  I-STAT BETA HCG BLOOD, ED ([facility], [facility], [facility] ONLY)    EKG EKG Interpretation  Date/Time:  Thursday January 22 2021 23:38:28 EDT Ventricular Rate:  76 PR Interval:  182 QRS Duration: 80 QT Interval:  374 QTC Calculation: 420 R Axis:  44 Text Interpretation: Normal sinus rhythm Biatrial enlargement Cannot rule out Anterior  infarct , age undetermined Abnormal ECG Confirmed by Orpah Greek [phone]) on 01/23/2021 12:14:54 AM   Radiology DG Chest 2 View  Result Date: 01/23/2021 CLINICAL DATA:  Edema, fatigue, blurred vision and lower extremity swelling EXAM: CHEST - 2 VIEW COMPARISON:  Radiograph 09/12/2020 FINDINGS: Streaky and hazy basilar opacities are favored to be atelectatic in the absence of additional features to suggest frank pulmonary edema. Cardiac silhouette is within normal limits for AP radiography. Cardiomediastinal contours are otherwise unremarkable. No pneumothorax. No layering effusion. Degenerative changes are present in the imaged spine and shoulders. IMPRESSION: Likely atelectasis without convincing features to suggest pulmonary edema at this time. Electronically Signed   By: Lovena Le M.D.   On: 01/23/2021 01:15    Procedures Procedures   Medications Ordered in ED Medications - No data to display  ED Course  I have reviewed the triage vital signs and the nursing notes.  Pertinent labs & imaging results that were available during my care of the patient were reviewed by me and considered in my medical decision making (see chart for details).    MDM Rules/Calculators/A&P                          Patient presents to the emergency department for evaluation of leg swelling and palpitations.  She tells me she was previously seen by Dr. Stanford Breed for similar symptoms.  I have found an office visit from 2013 where she was seen for dyspnea and her cardiac evaluation was found to be entirely normal.  She had a stress echo in that year.  She had mild mitral and tricuspid regurgitation.  Patient's work-up has been reassuring.  Lab work unremarkable.  No proteinuria.  Chest x-ray without evidence of congestive heart failure.  She only has very mild trace pedal edema bilaterally, likely dependent.  She can follow-up with Dr. Stanford Breed in the office she continues to have symptoms but at this time  does not require any further interventions.  Final Clinical Impression(s) / ED Diagnoses Final diagnoses:  Bilateral lower extremity edema    Rx / DC Orders ED Discharge Orders    None       Ramirez Fullbright, Gwenyth Allegra, MD 01/23/21 [phone]

## 2021-01-23 NOTE — ED Notes (Signed)
Patient was given discharge instructions and tore them up states she was going to check right back in.

## 2021-01-23 NOTE — ED Notes (Signed)
Pt to radiology.

## 2021-01-27 ENCOUNTER — Emergency Department (HOSPITAL_COMMUNITY): Payer: Medicaid Other

## 2021-01-27 ENCOUNTER — Encounter (HOSPITAL_COMMUNITY): Payer: Self-pay

## 2021-01-27 ENCOUNTER — Emergency Department (HOSPITAL_COMMUNITY)
Admission: EM | Admit: 2021-01-27 | Discharge: 2021-01-28 | Disposition: A | Discharge status: Home / Self Care | Payer: Medicaid Other | Attending: Emergency Medicine | Admitting: Emergency Medicine

## 2021-01-27 ENCOUNTER — Other Ambulatory Visit: Payer: Self-pay

## 2021-01-27 DIAGNOSIS — F17210 Nicotine dependence, cigarettes, uncomplicated: Secondary | ICD-10-CM | POA: Insufficient documentation

## 2021-01-27 DIAGNOSIS — W500XXA Accidental hit or strike by another person, initial encounter: Secondary | ICD-10-CM | POA: Diagnosis not present

## 2021-01-27 DIAGNOSIS — M549 Dorsalgia, unspecified: Secondary | ICD-10-CM

## 2021-01-27 DIAGNOSIS — R52 Pain, unspecified: Secondary | ICD-10-CM

## 2021-01-27 DIAGNOSIS — I509 Heart failure, unspecified: Secondary | ICD-10-CM | POA: Insufficient documentation

## 2021-01-27 DIAGNOSIS — S299XXA Unspecified injury of thorax, initial encounter: Secondary | ICD-10-CM | POA: Diagnosis not present

## 2021-01-27 DIAGNOSIS — M546 Pain in thoracic spine: Secondary | ICD-10-CM | POA: Insufficient documentation

## 2021-01-27 DIAGNOSIS — R202 Paresthesia of skin: Secondary | ICD-10-CM | POA: Diagnosis not present

## 2021-01-27 MED ORDER — ACETAMINOPHEN 500 MG PO TABS
1000.0000 mg | ORAL_TABLET | Freq: Once | ORAL | Status: AC
  Administered 2021-01-28: 1000 mg via ORAL
  Filled 2021-01-27: qty 2

## 2021-01-27 MED ORDER — IBUPROFEN 800 MG PO TABS
800.0000 mg | ORAL_TABLET | Freq: Once | ORAL | Status: AC
  Administered 2021-01-28: 800 mg via ORAL
  Filled 2021-01-27: qty 1
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
## 2021-01-28 ENCOUNTER — Encounter (HOSPITAL_COMMUNITY): Payer: Self-pay | Admitting: Emergency Medicine

## 2021-01-28 MED ORDER — LIDOCAINE 5 % EX PTCH: 1.0000 | MEDICATED_PATCH | CUTANEOUS | 0 refills | Status: DC
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
## 2021-02-08 ENCOUNTER — Ambulatory Visit (HOSPITAL_COMMUNITY)
Admission: EM | Admit: 2021-02-08 | Discharge: 2021-02-08 | Disposition: A | Discharge status: Home / Self Care | Payer: Medicaid Other | Attending: Psychiatry | Admitting: Psychiatry

## 2021-02-08 ENCOUNTER — Other Ambulatory Visit: Payer: Self-pay

## 2021-02-08 DIAGNOSIS — Z5900 Homelessness unspecified: Secondary | ICD-10-CM | POA: Insufficient documentation

## 2021-02-08 DIAGNOSIS — F1020 Alcohol dependence, uncomplicated: Secondary | ICD-10-CM

## 2021-02-08 DIAGNOSIS — Z9151 Personal history of suicidal behavior: Secondary | ICD-10-CM | POA: Insufficient documentation

## 2021-02-08 DIAGNOSIS — F1094 Alcohol use, unspecified with alcohol-induced mood disorder: Secondary | ICD-10-CM | POA: Insufficient documentation

## 2021-02-08 DIAGNOSIS — F17290 Nicotine dependence, other tobacco product, uncomplicated: Secondary | ICD-10-CM | POA: Insufficient documentation

## 2021-02-08 DIAGNOSIS — Z560 Unemployment, unspecified: Secondary | ICD-10-CM | POA: Insufficient documentation

## 2021-02-08 NOTE — ED Notes (Signed)
Patient discharged to home.  AVS and resources given to patient by provider and all questions answered.  Patient stated that she felt safe and ready for discharge.  Ambulated to door independently.  All belongings returned.  Patient discharged in stable condition.  No acute distress noted.

## 2021-02-08 NOTE — ED Provider Notes (Addendum)
Behavioral Health Urgent Care Medical Screening Exam  Patient Name: Anita Hill MRN: [id number] Date of Evaluation: 02/08/21 Chief Complaint:  Substance Abuse Treatment, Housing Diagnosis:  Final diagnoses:  Alcohol use disorder, moderate, dependence ([place])    History of Present illness: Anita Hill is a 50 y.o. 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  Patient states that she came to the behavioral health urgent care because she is seeking substance abuse treatment and housing.  She reports that she is currently homeless and intermittently living in hotels.  She reports that another reason she came to the behavioral health urgent care was because 2 gentleman she has been intermittently living with in hotels "have been sabotaging me and not behaving right", in which the patient states that this has made her very angry.  Patient reports that she was sober from alcohol/in recovery from alcohol for 20 years, but relapsed on alcohol 5 years ago and has been drinking alcohol daily since then.  Patient reports that she drinks 2 to 3 24 ounce beers daily.  She denies history of withdrawal symptoms or seizures.  Per chart review, patient was admitted at [facility] for 5 days in March 2022 (01/09/2021-01/14/2021) for alcohol detox/depression.  Per chart review of this admission, patient reported drinking 14-15 beers/1 case of beer daily at that time and it appears that patient received Librium detox for alcohol withdrawal symptoms during this admission.  Per chart review, it also appears the patient was discharged from this admission on Remeron 7.5 mg nightly, as well as with a scheduled appointment for medication management at [facility] in  [facility] on January 16, 2021 at 1:00 PM and with instructions to call [facility] for substance abuse daily until a bed became available for her at this facility for substance abuse treatment.  Patient states that she missed her January 16, 2021 [facility] appointment and has been needing to call back to reschedule.  Patient also states that 1 or 2 days after she was discharged from [facility], she was able to go to [facility].  She reports that [facility].  When patient is asked why she left a mark, she reports that she was told she would receive further substance abuse treatment at another facility, but states that when she left a mark she found out that that was not the case.  Patient denies SI, HI, AVH, paranoia, or delusions on exam.  Patient denies past suicide attempts.  However, per chart review including Care Everywhere patient noted to have attempted suicide in the past.  Patient denies history of self-injurious behaviors via cutting or burning herself intentionally.  Patient reports smoking 2 tobacco cigars daily, but she denies additional substance use.  Patient reports that she is homeless currently but has been living in the [facility] area.  She denies access to guns or weapons.  She states that she is currently unemployed.  She reports that she is not currently taking psychotropic medications and she states that she is not seeing a psychiatrist or therapist currently as an outpatient.  Patient denies additional inpatient psychiatric treatment aside from her admission to [facility] in March 2022 but per chart  review including Care Everywhere it appears that the patient has received inpatient psychiatric treatment in the past in 2016 at [facility].  Patient states that she needs to find housing and obtain substance abuse treatment, as well as see a  psychiatrist and a therapist.  Psychiatric Specialty Exam  Presentation  General Appearance:Appropriate for Environment; Casual  Eye Contact:Good  Speech:Clear and Coherent; Normal Rate  Speech Volume:Normal  Handedness:No data recorded  Mood and Affect  Mood:Euthymic  Affect:Congruent   Thought Process  Thought Processes:Coherent; Goal Directed; Linear  Descriptions of Associations:Intact  Orientation:Full (Time, Place and Person)  Thought Content:Logical; WDL    Hallucinations:None  Ideas of Reference:None  Suicidal Thoughts:No  Homicidal Thoughts:No   Sensorium  Memory:Immediate Fair; Recent Fair; Remote Fair  Judgment:Fair  Insight:Fair   Executive Functions  Concentration:Fair  Attention Span:Fair  [place]   Psychomotor Activity  Psychomotor Activity:Normal   Assets  Assets:Communication Skills; Desire for Improvement; Financial Resources/Insurance; Housing; Leisure Time; Physical Health; Resilience   Sleep  Sleep:Fair  Number of hours: No data recorded    Physical Exam: Physical Exam Vitals reviewed.  Constitutional:      General: She is not in acute distress.    Appearance: She is not ill-appearing, toxic-appearing or diaphoretic.  HENT:     Head: Normocephalic and atraumatic.     Right Ear: External ear normal.     Left Ear: External ear normal.  Cardiovascular:     Rate and Rhythm: Normal rate.  Pulmonary:     Effort: Pulmonary effort is normal. No respiratory distress.  Musculoskeletal:        General: Normal range of motion.     Cervical back: Normal range of motion.  Neurological:     Mental Status: She is alert and oriented to person, place, and time.     Comments: No tremor noted.   Psychiatric:        Attention and Perception: Attention normal. She does not perceive auditory or visual hallucinations.        Mood and Affect: Mood normal.        Speech: Speech  normal.        Behavior: Behavior normal. Behavior is not agitated, slowed, aggressive, withdrawn, hyperactive or combative. Behavior is cooperative.        Thought Content: Thought content is not paranoid or delusional. Thought content does not include homicidal or suicidal ideation.     Comments: Stated mood is euthymic with congruent affect. Judgement fair and insight fair.     Review of Systems  Constitutional: Negative for chills, diaphoresis, fever, malaise/fatigue and weight loss.  HENT: Negative for congestion.   Respiratory: Negative for cough and shortness of breath.   Cardiovascular: Negative for chest pain and palpitations.  Gastrointestinal: Negative for abdominal pain, constipation, diarrhea, nausea and vomiting.  Musculoskeletal: Positive for back pain. Negative for myalgias.       Positive for left hip pain. Patient states her hip and back pain is chronic in nature.   Neurological: Negative for dizziness, tremors, seizures and headaches.  Psychiatric/Behavioral: Positive for substance abuse. Negative for depression, hallucinations, memory loss and suicidal ideas. The patient is not nervous/anxious and does not have insomnia.   All other systems reviewed and are negative.    Vitals: Blood pressure (!) 139/95, pulse 78, temperature (!) 97.3 F (36.3 C), temperature source Tympanic, SpO2 100 %. There is no height or weight on file to calculate BMI.  Musculoskeletal:  Strength & Muscle Tone: within normal limits Gait & Station: normal Patient leans: N/A   [place] MSE Discharge Disposition for Follow up and Recommendations: Based on my evaluation the patient does not appear to have an emergency medical condition and can be discharged with resources and follow up care in outpatient services for Medication Management and Individual Therapy  Patient denies SI, HI, AVH, paranoia, and delusions on exam and does not appear to be psychotic at this time.  Patient does not meet criteria  for inpatient psychiatric treatment or [facility] continuous observation/assessment at this time. Patient reports that she wants to go to the emergency department for left thoracic/lower back back/hip pain that has been chronic over the past few months due to her pain not improving on medication she has received for pain in the ED in the past.  Per chart review, patient presented to [facility] emergency department on January 28, 2021 for back pain/similar presentation.  No emergency medical condition was determined to be present at that time and patient was discharged from the ED at that time.  Based on patient's presentation and my assessment, do not believe that the patient's back/hip pain is a medical emergency at this time and do not believe that the patient needs to be transferred to the emergency department for further evaluation of this back/hip pain at this time.  Patient verbalizes understanding that she will not be transferred to the emergency department for this issue. Patient is seeking housing and substance abuse treatment, as well as therapy and medication management follow-up. Patient is currently in the [facility] area and has [employer].  Thus, patient meets criteria for [facility] open access hours. Recommend that the patient attend [facility] second-floor open access hours for medication management/psychiatry and therapy services.  Monday through Friday [facility] open access schedule provided to the patient in the patient's AVS and this was personally reviewed with the patient as well. If the patient is unable to attend [facility] open access hours for medication management, recommend that the patient contact [place] in [facility] to reschedule a new medication management/psychiatry appointment. Resource list of [facility]  outpatient psychiatry/medication management and counseling facilities provided to the patient for the patient to utilize in the case that she is unable to attend [facility] open access hours or is unable to schedule a new [facility] medication management appointment. Patient verbalizes understanding and agreement of this plan and she states that she will first attempt to go to [facility] open access hours and if she is unsuccessful in this, she will call [facility] to try to schedule an and medication management appointment.  Patient also states that she will utilize the [facility] therapy and psychiatry resource sheet provided as needed if necessary. Safety planning done at length with the patient regarding appropriate actions for the patient to take/resources to utilize [facility], [place], ED, suicide prevention Lifeline) if the patient becomes suicidal, homicidal, if her condition rapidly deteriorates/worsens/does not improve, or if the patient begins to experience a mental health crisis.  This information was also included in the patient's AVS. Substance abuse treatment facility resource packet and homeless shelter/housing resource list provided to the patient. Patient verbalizes understanding and agreement of the overall plan, including safety plan. Patient can contract for her safety outside of the behavioral health urgent care. Patient states that she rode  to the behavioral health urgent care on her bike and she reports that she would like to ride her bike to her destination of choice upon discharge. All patient's questions answered and concerns addressed. Patient discharged to the street/her destination of choice.   Prescilla Sours, PA-C 02/08/2021, 8:37 AM

## 2021-02-08 NOTE — BH Assessment (Signed)
Anita Hill. [name] Routine MR [id number]: 50 year old present this date with stressful moods.  Pt reports that she is homeless and 'I want to flip out'.  Pt denies SI, HI or AVH.  Pt reports that she needs to talk to someone. Pt denies any prior MH diagnosis or prescribed medication for symptom management.

## 2021-02-08 NOTE — Discharge Instructions (Signed)
  Discharge recommendations:  Patient is to take medications as prescribed. Please see information for follow-up appointment with psychiatry and therapy. Please follow up with your primary care provider for all medical related needs.   Therapy: We recommend that patient participate in individual therapy to address mental health concerns.  Medications: The patient is to contact a medical professional and/or outpatient provider to address any new side effects that develop. Patient should update outpatient providers of any new medications and/or medication changes.   Safety:  The patient should abstain from use of illicit substances/drugs and abuse of any medications. If symptoms worsen or do not continue to improve or if the patient becomes actively suicidal or homicidal then it is recommended that the patient return to the closest hospital emergency department, the [facility], or call 911 for further evaluation and treatment. National Suicide Prevention Lifeline 1-800-SUICIDE or 905-427-3434.

## 2021-02-08 NOTE — ED Notes (Signed)
Pt belongings in locker color coded black.

## 2021-02-12 ENCOUNTER — Emergency Department (HOSPITAL_COMMUNITY)
Admission: EM | Admit: 2021-02-12 | Discharge: 2021-02-12 | Disposition: A | Discharge status: Home / Self Care | Payer: Medicaid Other | Attending: Emergency Medicine | Admitting: Emergency Medicine

## 2021-02-12 DIAGNOSIS — F17290 Nicotine dependence, other tobacco product, uncomplicated: Secondary | ICD-10-CM | POA: Diagnosis not present

## 2021-02-12 DIAGNOSIS — M545 Low back pain, unspecified: Secondary | ICD-10-CM

## 2021-02-12 DIAGNOSIS — M5442 Lumbago with sciatica, left side: Secondary | ICD-10-CM | POA: Insufficient documentation

## 2021-02-12 DIAGNOSIS — F17210 Nicotine dependence, cigarettes, uncomplicated: Secondary | ICD-10-CM | POA: Insufficient documentation

## 2021-02-12 DIAGNOSIS — M79605 Pain in left leg: Secondary | ICD-10-CM

## 2021-02-12 MED ORDER — IBUPROFEN 600 MG PO TABS: 600.0000 mg | ORAL_TABLET | Freq: Four times a day (QID) | ORAL | 0 refills | Status: DC | PRN

## 2021-02-12 MED ORDER — CYCLOBENZAPRINE HCL 10 MG PO TABS: 10.0000 mg | ORAL_TABLET | Freq: Two times a day (BID) | ORAL | 0 refills | Status: DC | PRN

## 2021-02-12 NOTE — ED Provider Notes (Signed)
[facility] EMERGENCY DEPARTMENT Provider Note   CSN: [id number] Arrival date & time: 02/12/21  0744     History Chief Complaint  Patient presents with  . Hip Pain    left  . Back Pain    Anita Hill is a 50 y.o. female.  The history is provided by the patient and medical records. No language interpreter was used.  Hip Pain  Back Pain    50 year old female significant history of bipolar, substance abuse, homelessness, presenting for evaluation of left hip pain.  Patient states for the past 2 to 3 months she has had pain primarily to her left buttock that radiates down her leg.  Pain is described as a "spinning sensation, burning sensation and legs feels numb" worse with movement, but has been persistent.  No specific treatment tried.  No complaint of bowel bladder incontinence no saddle anesthesia no recent injury no rash no fever chills.  She felt it may be due to increased ambulation as she is homeless.  Past Medical History:  Diagnosis Date  . BIPOLAR DISORDER   . Delusions ([place])   . Homelessness   . Left ventricular hypertrophy   . Palpitations   . Psychosis ([place])   . Sickle cell trait ([place])   . Substance abuse [facility])     Patient Active Problem List   Diagnosis Date Noted  . Bipolar mixed affective disorder, moderate ([place]) 11/23/2016  . Chest pain 01/20/2016  . Dyspnea 10/13/2012  . Tobacco abuse 07/06/2012  . Palpitations   . Psychosis ([place])   . Congestive heart failure ([place])   . BIPOLAR DISORDER 12/29/2006  . BACK PAIN, LOW 12/29/2006    Past Surgical History:  Procedure Laterality Date  . BUNIONECTOMY       OB History    Gravida  1   Para      Term      Preterm      AB      Living        SAB      IAB      Ectopic      Multiple      Live Births              Family History  Problem Relation Age of Onset  . Heart disease Mother        CHF  . Prostate cancer Father     Social History   Tobacco Use   . Smoking status: Current Every Day Smoker    Packs/day: 1.00    Years: 10.00    Pack years: 10.00    Types: Cigarettes, Cigars  . Smokeless tobacco: Never Used  Vaping Use  . Vaping Use: Never used  Substance Use Topics  . Alcohol use: Yes    Comment: everyday 20-80oz  . Drug use: No    Comment: Previous cocaine, heroin, marijuana    Home Medications Prior to Admission medications   Medication Sig Start Date End Date Taking? Authorizing Provider  ibuprofen (ADVIL) 800 MG tablet Take 1 tablet (800 mg total) by mouth every 8 (eight) hours as needed. 11/11/20   Blanchie Dessert, MD  lidocaine (LIDODERM) 5 % Place 1 patch onto the skin daily. Remove & Discard patch within 12 hours or as directed by MD 01/28/21   Randal Buba, April, MD  naproxen (NAPROSYN) 375 MG tablet Take 1 tablet (375 mg total) by mouth 2 (two) times daily. 06/04/19   Charlann Lange, PA-C  polyvinyl alcohol (  LIQUIFILM TEARS) 1.4 % ophthalmic solution Place 1 drop into both eyes daily.    [provider]  potassium chloride SA (K-DUR,KLOR-CON) 20 MEQ tablet Take 1 tablet (20 mEq total) by mouth 2 (two) times daily. Patient not taking: Reported on 12/11/2018 01/20/16 05/23/19  Lelon Perla, MD    Allergies    Strawberry extract, Risperidone and related, and Latuda [lurasidone hcl]  Review of Systems   Review of Systems  Musculoskeletal: Positive for back pain.  All other systems reviewed and are negative.   Physical Exam Updated Vital Signs BP 107/76   Pulse 75   Temp 97.9 F (36.6 C) (Oral)   Resp 16   Ht 5\' 2"  (1.575 m)   Wt 68 kg   SpO2 99%   BMI 27.44 kg/m   Physical Exam Vitals and nursing note reviewed.  Constitutional:      General: She is not in acute distress.    Appearance: She is well-developed.  HENT:     Head: Atraumatic.  Eyes:     Conjunctiva/sclera: Conjunctivae normal.  Abdominal:     Palpations: Abdomen is soft.     Tenderness: There is no abdominal tenderness.   Musculoskeletal:        General: Tenderness (Tenderness to the left lumbosacral region without significant midline spine tenderness.  Negative straight leg raise.  Intact distal pedal pulses.  Leg compartments are soft.) present. Normal range of motion.     Cervical back: Neck supple.  Skin:    Findings: No rash.  Neurological:     Mental Status: She is alert.     Comments: Able to ambulate     ED Results / Procedures / Treatments   Labs (all labs ordered are listed, but only abnormal results are displayed) Labs Reviewed  COMPREHENSIVE METABOLIC PANEL  ETHANOL  CBC WITH DIFFERENTIAL/PLATELET  URINALYSIS, ROUTINE W REFLEX MICROSCOPIC  CBG MONITORING, ED    EKG3 None   Date: 02/12/2021  Rate: 67  Rhythm: normal sinus rhythm  QRS Axis: normal  Intervals: normal  ST/T Wave abnormalities: normal  Conduction Disutrbances: none  Narrative Interpretation: LVH, early repol  Old EKG Reviewed: No significant changes noted     Radiology No results found.  Procedures Procedures   Medications Ordered in ED Medications - No data to display  ED Course  I have reviewed the triage vital signs and the nursing notes.  Pertinent labs & imaging results that were available during my care of the patient were reviewed by me and considered in my medical decision making (see chart for details).    MDM Rules/Calculators/A&P                          BP 107/76   Pulse 75   Temp 97.9 F (36.6 C) (Oral)   Resp 16   Ht 5\' 2"  (1.575 m)   Wt 68 kg   SpO2 99%   BMI 27.44 kg/m   Final Clinical Impression(s) / ED Diagnoses Final diagnoses:  Low back pain radiating to left leg    Rx / DC Orders ED Discharge Orders         Ordered    ibuprofen (ADVIL) 600 MG tablet  Every 6 hours PRN        02/12/21 0818    cyclobenzaprine (FLEXERIL) 10 MG tablet  2 times daily PRN        02/12/21 0818  8:16 AM Patient complains of left lumbosacral pain with radicular pain.  She is  neurovascular intact.  She does have intact sensation distal to her legs.  She is able to ambulate.  No recent injury.  Will provide symptomatic treatment and outpatient follow-up. No red flags.     Domenic Moras, PA-C 02/12/21 [place], [place], MD 02/12/21 [phone]

## 2021-02-12 NOTE — Discharge Instructions (Signed)
Take ibuprofen and Flexeril as needed for your pain.  Call follow-up closely with your primary care doctor for further care.

## 2021-02-12 NOTE — ED Triage Notes (Signed)
Pt to ED via POV c/o left hip pain that moves up her back, reports this has been going on for 2 weeks.

## 2021-02-12 NOTE — ED Notes (Signed)
Pt d/c home per MD order. Discharge summary reviewed with pt, pt verbalizes understanding. No s/s of acute distress noted at discharge.  

## 2021-04-07 ENCOUNTER — Emergency Department (HOSPITAL_COMMUNITY)
Admission: EM | Admit: 2021-04-07 | Discharge: 2021-04-07 | Discharge status: Against Medical Advice | Payer: Medicaid Other

## 2021-04-07 NOTE — ED Notes (Signed)
UNable to locate in lobby

## 2021-04-07 NOTE — ED Notes (Signed)
Pt called x3 for vitals, did not answer.

## 2021-04-09 ENCOUNTER — Encounter (HOSPITAL_COMMUNITY): Payer: Self-pay | Admitting: Emergency Medicine

## 2021-04-09 ENCOUNTER — Emergency Department (HOSPITAL_COMMUNITY)
Admission: EM | Admit: 2021-04-09 | Discharge: 2021-04-09 | Disposition: A | Discharge status: Home / Self Care | Payer: Medicaid Other | Attending: Emergency Medicine | Admitting: Emergency Medicine

## 2021-04-09 DIAGNOSIS — I509 Heart failure, unspecified: Secondary | ICD-10-CM | POA: Diagnosis not present

## 2021-04-09 DIAGNOSIS — R21 Rash and other nonspecific skin eruption: Secondary | ICD-10-CM | POA: Diagnosis present

## 2021-04-09 DIAGNOSIS — L259 Unspecified contact dermatitis, unspecified cause: Secondary | ICD-10-CM

## 2021-04-09 DIAGNOSIS — F17210 Nicotine dependence, cigarettes, uncomplicated: Secondary | ICD-10-CM | POA: Insufficient documentation

## 2021-04-09 MED ORDER — HYDROCORTISONE 1 % EX CREA: TOPICAL_CREAM | CUTANEOUS | 0 refills | Status: DC

## 2021-04-09 MED ORDER — PREDNISONE 10 MG (21) PO TBPK: ORAL_TABLET | Freq: Every day | ORAL | 0 refills | Status: DC

## 2021-04-09 NOTE — ED Provider Notes (Signed)
[facility] EMERGENCY DEPARTMENT Provider Note   CSN: [id number] Arrival date & time: 04/09/21  0745     History Chief Complaint  Patient presents with   Pruritis    Anita Hill is a 50 y.o. female.  HPI Patient is a 50 year old female with a medical history as noted below.  She states that she recently moved into a new location with a friend about 4 days ago.  2 days ago she began experiencing an erythematous pruritic rash to the legs, abdomen, and buttocks.  She denies any new soaps or lotions but does note that she is living in her friend's home and is unsure of new detergents.  She states her friend is not having a similar rash.  Denies any fevers, chills, nausea, vomiting, GU complaints.    Past Medical History:  Diagnosis Date   BIPOLAR DISORDER    Delusions ([place])    Homelessness    Left ventricular hypertrophy    Palpitations    Psychosis ([place])    Sickle cell trait ([place])    Substance abuse [facility])     Patient Active Problem List   Diagnosis Date Noted   Bipolar mixed affective disorder, moderate ([place]) 11/23/2016   Chest pain 01/20/2016   Dyspnea 10/13/2012   Tobacco abuse 07/06/2012   Palpitations    Psychosis ([place])    Congestive heart failure ([place])    BIPOLAR DISORDER 12/29/2006   BACK PAIN, LOW 12/29/2006    Past Surgical History:  Procedure Laterality Date   BUNIONECTOMY       OB History     Gravida  1   Para      Term      Preterm      AB      Living         SAB      IAB      Ectopic      Multiple      Live Births              Family History  Problem Relation Age of Onset   Heart disease Mother        CHF   Prostate cancer Father     Social History   Tobacco Use   Smoking status: Every Day    Packs/day: 1.00    Years: 10.00    Pack years: 10.00    Types: Cigarettes, Cigars   Smokeless tobacco: Never  Vaping Use   Vaping Use: Never used  Substance Use Topics   Alcohol use: Yes     Comment: everyday 20-80oz   Drug use: No    Comment: Previous cocaine, heroin, marijuana    Home Medications Prior to Admission medications   Medication Sig Start Date End Date Taking? Authorizing Provider  hydrocortisone cream 1 % Apply to affected area 2 times daily 04/09/21  Yes Izan Miron, PA-C  predniSONE (STERAPRED UNI-PAK 21 TAB) 10 MG (21) TBPK tablet Take by mouth daily. Take 6 tabs by mouth daily  for 2 days, then 5 tabs for 2 days, then 4 tabs for 2 days, then 3 tabs for 2 days, 2 tabs for 2 days, then 1 tab by mouth daily for 2 days 04/09/21  Yes Rayna Sexton, PA-C  cyclobenzaprine (FLEXERIL) 10 MG tablet Take 1 tablet (10 mg total) by mouth 2 (two) times daily as needed for muscle spasms. 02/12/21   Domenic Moras, PA-C  ibuprofen (ADVIL) 600 MG tablet Take 1 tablet (600  mg total) by mouth every 6 (six) hours as needed. 02/12/21   Domenic Moras, PA-C  lidocaine (LIDODERM) 5 % Place 1 patch onto the skin daily. Remove & Discard patch within 12 hours or as directed by MD 01/28/21   Randal Buba, April, MD  polyvinyl alcohol (LIQUIFILM TEARS) 1.4 % ophthalmic solution Place 1 drop into both eyes daily.    [provider]  potassium chloride SA (K-DUR,KLOR-CON) 20 MEQ tablet Take 1 tablet (20 mEq total) by mouth 2 (two) times daily. Patient not taking: Reported on 12/11/2018 01/20/16 05/23/19  Lelon Perla, MD    Allergies    Strawberry extract, Risperidone and related, and Latuda [lurasidone hcl]  Review of Systems   Review of Systems  Constitutional:  Negative for chills and fever.  Respiratory:  Negative for cough, shortness of breath and wheezing.   Gastrointestinal:  Negative for nausea and vomiting.  Genitourinary:  Negative for dysuria and vaginal discharge.  Skin:  Positive for color change and rash. Negative for wound.   Physical Exam Updated Vital Signs BP 121/88 (BP Location: Left Arm)   Pulse 77   Temp 97.8 F (36.6 C) (Oral)   Resp 17   SpO2 100%    Physical Exam Vitals and nursing note reviewed.  Constitutional:      General: She is not in acute distress.    Appearance: She is well-developed.  HENT:     Head: Normocephalic and atraumatic.     Right Ear: External ear normal.     Left Ear: External ear normal.  Eyes:     General: No scleral icterus.       Right eye: No discharge.        Left eye: No discharge.     Conjunctiva/sclera: Conjunctivae normal.  Neck:     Trachea: No tracheal deviation.  Cardiovascular:     Rate and Rhythm: Normal rate.  Pulmonary:     Effort: Pulmonary effort is normal. No respiratory distress.     Breath sounds: No stridor.  Abdominal:     General: There is no distension.  Musculoskeletal:        General: No swelling or deformity.     Cervical back: Neck supple.  Skin:    General: Skin is warm and dry.     Findings: Erythema present. No rash.     Comments: Erythematous maculopapular rash noted to the BLEs and the lower abdomen.  Diffuse excoriations noted overlying the rash.  No drainage.  No tenderness.  Neurological:     Mental Status: She is alert.     Cranial Nerves: Cranial nerve deficit: no gross deficits.    ED Results / Procedures / Treatments   Labs (all labs ordered are listed, but only abnormal results are displayed) Labs Reviewed - No data to display  EKG None  Radiology No results found.  Procedures Procedures   Medications Ordered in ED Medications - No data to display  ED Course  I have reviewed the triage vital signs and the nursing notes.  Pertinent labs & imaging results that were available during my care of the patient were reviewed by me and considered in my medical decision making (see chart for details).    MDM Rules/Calculators/A&P                          Patient is a 50 year old female who presents to the emergency department due to what appears to be a contact  dermatitis.  She has an erythematous maculopapular rash to the legs and lower  abdomen.  Denies any GU complaints and states she is not sexually active.  No one in the household with similar symptoms.  No involvement of the webspaces of the fingers or toes.  She denies a history of diabetes mellitus.  Will discharge on a course of prednisone and also gave patient a topical hydrocortisone cream.  Recommended PCP follow-up.  We discussed return precautions.  Feel the patient is stable for discharge at this time and she is agreeable.  Final Clinical Impression(s) / ED Diagnoses Final diagnoses:  Contact dermatitis, unspecified contact dermatitis type, unspecified trigger    Rx / DC Orders ED Discharge Orders          Ordered    hydrocortisone cream 1 %        04/09/21 0843    predniSONE (STERAPRED UNI-PAK 21 TAB) 10 MG (21) TBPK tablet  Daily        04/09/21 0843             Rayna Sexton, PA-C 04/09/21 2003    Pattricia Boss, MD 04/10/21 [phone]

## 2021-04-09 NOTE — Discharge Instructions (Addendum)
I prescribed you 2 medications.  First medication is called hydrocortisone.  This is a cream that you can apply to the rash 1-2 times per day.  This will help with itching as well as inflammation.  I have also prescribed you prednisone.  Please take this once per day in the morning.  Only take it as prescribed.  Try to take it earlier in the day because this medication can be stimulating and make it difficult to sleep.  If you develop any new or worsening symptoms please come back to the emergency department.  It was a pleasure to meet you.

## 2021-04-09 NOTE — ED Triage Notes (Signed)
Patient complains of itching and draining of a rash that appeared after exposure to a dog two days ago on ankles, buttocks, and inner thighs. Patient alert, oriented, and in no apparent distress at this time.

## 2021-04-14 DIAGNOSIS — E876 Hypokalemia: Secondary | ICD-10-CM | POA: Diagnosis not present

## 2021-04-14 DIAGNOSIS — L309 Dermatitis, unspecified: Secondary | ICD-10-CM | POA: Diagnosis not present

## 2021-04-14 DIAGNOSIS — F1020 Alcohol dependence, uncomplicated: Secondary | ICD-10-CM | POA: Diagnosis not present

## 2021-04-14 DIAGNOSIS — F209 Schizophrenia, unspecified: Secondary | ICD-10-CM | POA: Diagnosis not present

## 2021-04-14 DIAGNOSIS — Z20822 Contact with and (suspected) exposure to covid-19: Secondary | ICD-10-CM | POA: Diagnosis not present

## 2021-04-19 DIAGNOSIS — R0902 Hypoxemia: Secondary | ICD-10-CM | POA: Diagnosis not present

## 2021-04-19 DIAGNOSIS — Z743 Need for continuous supervision: Secondary | ICD-10-CM | POA: Diagnosis not present

## 2021-04-19 DIAGNOSIS — R609 Edema, unspecified: Secondary | ICD-10-CM | POA: Diagnosis not present

## 2021-04-19 DIAGNOSIS — M25562 Pain in left knee: Secondary | ICD-10-CM | POA: Diagnosis not present

## 2021-04-19 DIAGNOSIS — R6889 Other general symptoms and signs: Secondary | ICD-10-CM | POA: Diagnosis not present

## 2021-04-20 ENCOUNTER — Encounter (HOSPITAL_COMMUNITY): Payer: Self-pay

## 2021-04-20 ENCOUNTER — Emergency Department (HOSPITAL_COMMUNITY): Payer: Medicaid Other

## 2021-04-20 ENCOUNTER — Other Ambulatory Visit: Payer: Self-pay

## 2021-04-20 ENCOUNTER — Emergency Department (HOSPITAL_COMMUNITY)
Admission: EM | Admit: 2021-04-20 | Discharge: 2021-04-20 | Disposition: A | Discharge status: Home / Self Care | Payer: Medicaid Other | Attending: Emergency Medicine | Admitting: Emergency Medicine

## 2021-04-20 DIAGNOSIS — M25562 Pain in left knee: Secondary | ICD-10-CM | POA: Insufficient documentation

## 2021-04-20 DIAGNOSIS — R109 Unspecified abdominal pain: Secondary | ICD-10-CM | POA: Insufficient documentation

## 2021-04-20 DIAGNOSIS — M25552 Pain in left hip: Secondary | ICD-10-CM | POA: Insufficient documentation

## 2021-04-20 DIAGNOSIS — I509 Heart failure, unspecified: Secondary | ICD-10-CM | POA: Diagnosis not present

## 2021-04-20 DIAGNOSIS — G8929 Other chronic pain: Secondary | ICD-10-CM | POA: Insufficient documentation

## 2021-04-20 DIAGNOSIS — F17210 Nicotine dependence, cigarettes, uncomplicated: Secondary | ICD-10-CM | POA: Diagnosis not present

## 2021-04-20 DIAGNOSIS — R52 Pain, unspecified: Secondary | ICD-10-CM

## 2021-04-20 LAB — URINALYSIS, ROUTINE W REFLEX MICROSCOPIC
Bilirubin Urine: NEGATIVE
Glucose, UA: NEGATIVE mg/dL
Ketones, ur: NEGATIVE mg/dL
Nitrite: NEGATIVE
Protein, ur: NEGATIVE mg/dL
Specific Gravity, Urine: 1.02 (ref 1.005–1.030)
pH: 6 (ref 5.0–8.0)

## 2021-04-20 LAB — I-STAT CHEM 8, ED
BUN: 16 mg/dL (ref 6–20)
Calcium, Ion: 1.14 mmol/L — ABNORMAL LOW (ref 1.15–1.40)
Chloride: 104 mmol/L (ref 98–111)
Creatinine, Ser: 0.7 mg/dL (ref 0.44–1.00)
Glucose, Bld: 84 mg/dL (ref 70–99)
HCT: 46 % (ref 36.0–46.0)
Hemoglobin: 15.6 g/dL — ABNORMAL HIGH (ref 12.0–15.0)
Potassium: 3.7 mmol/L (ref 3.5–5.1)
Sodium: 141 mmol/L (ref 135–145)
TCO2: 29 mmol/L (ref 22–32)

## 2021-04-20 LAB — URINALYSIS, MICROSCOPIC (REFLEX)

## 2021-04-20 LAB — PREGNANCY, URINE: Preg Test, Ur: NEGATIVE

## 2021-04-20 MED ORDER — NAPROXEN 500 MG PO TABS: 500.0000 mg | ORAL_TABLET | Freq: Two times a day (BID) | ORAL | 0 refills | Status: DC

## 2021-04-20 NOTE — ED Notes (Signed)
Pt was sen

## 2021-04-20 NOTE — ED Provider Notes (Signed)
Emergency Medicine Provider Triage Evaluation Note  MACAYLA EKDAHL , a 50 y.o. female  was evaluated in triage.  Pt complains of left hip and left knee pain x 1 year.  States symptoms have acutely worsened.  She denies recent trauma or injury.  She states that her urine has been foul smelling today.  Review of Systems  Positive: Joint pain, malodorous urine Negative: Fever, trauma  Physical Exam  BP (!) 138/96 (BP Location: Right Arm)   Pulse 62   Temp 98.2 F (36.8 C) (Oral)   Resp 18   Ht 5\' 2"  (1.575 m)   Wt 68 kg   SpO2 100%   BMI 27.42 kg/m  Gen:   Awake, no distress   Resp:  Normal effort  MSK:   Moves extremities without difficulty  Other:    Medical Decision Making  Medically screening exam initiated at 1:17 AM.  Appropriate orders placed.  LYNISHA OSUCH was informed that the remainder of the evaluation will be completed by another provider, this initial triage assessment does not replace that evaluation, and the importance of remaining in the ED until their evaluation is complete.  Joint pain Malodorous urine  Labs and imaging ordered.  Suspect arthritis.  ?UTI   Montine Circle, Hershal Coria 04/20/21 0118    Palumbo, April, MD 04/20/21 1607

## 2021-04-20 NOTE — ED Provider Notes (Signed)
[facility] EMERGENCY DEPARTMENT Provider Note   CSN: [id number] Arrival date & time: 04/20/21  0006     History Chief Complaint  Patient presents with   Flank Pain   Knee Pain    Anita Hill is a 50 y.o. female with past medical history significant for bipolar, homelessness, substance abuse, sickle cell trait who presents for evaluation of multiple complaints.  Patient states she has had left hip pain and left knee pain x1 year.  No recent injury or trauma.  Patient states it feels like they typically are however they feel worse when she ambulates.  She has not been seen for this previously.  She feels like her knee, slips" when she walks.  No overlying redness or warmth.  No recent IV drug use.  Recently discharged for EtOH rehab.  She denies fever, chills, nausea, vomiting, chest pain, shortness of breath, abdominal pain, diarrhea, dysuria, paresthesias or weakness.  Denies additional aggravating or alleviating factors.  Initially complained of malodorous urine today with triage her denies this on my complaints.  Denies any hematuria, flank pain or abdominal pain.  She has been tolerating p.o. intake without difficulty.  History obtained from patient and past medical records.  No interpreter used.  HPI     Past Medical History:  Diagnosis Date   BIPOLAR DISORDER    Delusions ([place])    Homelessness    Left ventricular hypertrophy    Palpitations    Psychosis ([place])    Sickle cell trait ([place])    Substance abuse [facility])     Patient Active Problem List   Diagnosis Date Noted   Bipolar mixed affective disorder, moderate ([place]) 11/23/2016   Chest pain 01/20/2016   Dyspnea 10/13/2012   Tobacco abuse 07/06/2012   Palpitations    Psychosis ([place])    Congestive heart failure ([place])    BIPOLAR DISORDER 12/29/2006   BACK PAIN, LOW 12/29/2006    Past Surgical History:  Procedure Laterality Date   BUNIONECTOMY       OB History     Gravida  1   Para       Term      Preterm      AB      Living         SAB      IAB      Ectopic      Multiple      Live Births              Family History  Problem Relation Age of Onset   Heart disease Mother        CHF   Prostate cancer Father     Social History   Tobacco Use   Smoking status: Every Day    Packs/day: 1.00    Years: 10.00    Pack years: 10.00    Types: Cigarettes, Cigars   Smokeless tobacco: Never  Vaping Use   Vaping Use: Never used  Substance Use Topics   Alcohol use: Yes    Comment: everyday 20-80oz   Drug use: No    Comment: Previous cocaine, heroin, marijuana    Home Medications Prior to Admission medications   Medication Sig Start Date End Date Taking? Authorizing Provider  naproxen (NAPROSYN) 500 MG tablet Take 1 tablet (500 mg total) by mouth 2 (two) times daily. 04/20/21  Yes Raya Mckinstry A, PA-C  cyclobenzaprine (FLEXERIL) 10 MG tablet Take 1 tablet (10 mg total) by mouth 2 (  two) times daily as needed for muscle spasms. 02/12/21   Domenic Moras, PA-C  hydrocortisone cream 1 % Apply to affected area 2 times daily 04/09/21   Rayna Sexton, PA-C  ibuprofen (ADVIL) 600 MG tablet Take 1 tablet (600 mg total) by mouth every 6 (six) hours as needed. 02/12/21   Domenic Moras, PA-C  lidocaine (LIDODERM) 5 % Place 1 patch onto the skin daily. Remove & Discard patch within 12 hours or as directed by MD 01/28/21   Randal Buba, April, MD  polyvinyl alcohol (LIQUIFILM TEARS) 1.4 % ophthalmic solution Place 1 drop into both eyes daily.    [provider]  predniSONE (STERAPRED UNI-PAK 21 TAB) 10 MG (21) TBPK tablet Take by mouth daily. Take 6 tabs by mouth daily  for 2 days, then 5 tabs for 2 days, then 4 tabs for 2 days, then 3 tabs for 2 days, 2 tabs for 2 days, then 1 tab by mouth daily for 2 days 04/09/21   Rayna Sexton, PA-C  potassium chloride SA (K-DUR,KLOR-CON) 20 MEQ tablet Take 1 tablet (20 mEq total) by mouth 2 (two) times daily. Patient not taking:  Reported on 12/11/2018 01/20/16 05/23/19  Lelon Perla, MD    Allergies    Strawberry extract, Risperidone and related, and Latuda [lurasidone hcl]  Review of Systems   Review of Systems  Constitutional: Negative.   HENT: Negative.    Respiratory: Negative.    Cardiovascular: Negative.   Gastrointestinal: Negative.   Genitourinary: Negative.   Musculoskeletal:  Negative for back pain, gait problem, joint swelling, myalgias, neck pain and neck stiffness.       Left knee, left hip pain  Skin: Negative.   Neurological: Negative.   All other systems reviewed and are negative.  Physical Exam Updated Vital Signs BP (!) 159/96   Pulse 66   Temp 97.9 F (36.6 C) (Oral)   Resp 15   Ht 5\' 2"  (1.575 m)   Wt 68 kg   SpO2 100%   BMI 27.42 kg/m   Physical Exam Physical Exam  Constitutional: Pt appears well-developed and well-nourished. No distress.  HENT:  Head: Normocephalic and atraumatic.  Mouth/Throat: Oropharynx is clear and moist. No oropharyngeal exudate.  Eyes: Conjunctivae are normal.  Neck: Normal range of motion. Neck supple.  Full ROM without pain  Cardiovascular: Normal rate, regular rhythm and intact distal pulses.   Pulmonary/Chest: Effort normal and breath sounds normal. No respiratory distress. Pt has no wheezes.  Abdominal: Soft. Pt exhibits no distension. There is no tenderness, rebound or guarding. No abd bruit or pulsatile mass.  Negative CVA tap bilaterally Musculoskeletal:  Full range of motion of the T-spine and L-spine with flexion, hyperextension, and lateral flexion. No midline tenderness or stepoffs. No tenderness to palpation of the spinous processes of the T-spine or L-spine. No tenderness to palpation of the paraspinous muscles of the L-spine.Negative straight leg raise. Full range of motion bilateral knees with flexion, extension.  Negative anterior drawer. Lymphadenopathy:    Pt has no cervical adenopathy.  Neurological: Pt is alert. Pt has  normal reflexes.  Normal 5/5 strength in upper and lower extremities bilaterally including dorsiflexion and plantar flexion, strong and equal grip strength Sensation normal to light and sharp touch Moves extremities without ataxia, coordination intact Normal gait Normal balance Skin: Skin is warm and dry. No rash noted or lesions noted. Pt is not diaphoretic. No erythema, ecchymosis,edema or warmth.  Psychiatric: Pt has a normal mood and affect. Behavior is normal.  Nursing note and vitals reviewed.  ED Results / Procedures / Treatments   Labs (all labs ordered are listed, but only abnormal results are displayed) Labs Reviewed  URINALYSIS, ROUTINE W REFLEX MICROSCOPIC - Abnormal; Notable for the following components:      Result Value   Hgb urine dipstick TRACE (*)    Leukocytes,Ua TRACE (*)    All other components within normal limits  URINALYSIS, MICROSCOPIC (REFLEX) - Abnormal; Notable for the following components:   Bacteria, UA RARE (*)    All other components within normal limits  I-STAT CHEM 8, ED - Abnormal; Notable for the following components:   Calcium, Ion 1.14 (*)    Hemoglobin 15.6 (*)    All other components within normal limits  URINE CULTURE  PREGNANCY, URINE    EKG None  Radiology DG Knee Complete 4 Views Left  Result Date: 04/20/2021 CLINICAL DATA:  Knee pain EXAM: LEFT KNEE - COMPLETE 4+ VIEW COMPARISON:  CT left knee 05/01/2020 FINDINGS: Chronic posttraumatic deformity of the tibial plateau. No acute fracture. No joint effusion. IMPRESSION: No acute abnormality of the left knee. Electronically Signed   By: Ulyses Jarred M.D.   On: 04/20/2021 02:28   DG HIP UNILAT WITH PELVIS 2-3 VIEWS LEFT  Result Date: 04/20/2021 CLINICAL DATA:  Left flank pain EXAM: DG HIP (WITH OR WITHOUT PELVIS) 2-3V LEFT COMPARISON:  None. FINDINGS: There is no evidence of hip fracture or dislocation. There is no evidence of arthropathy or other focal bone abnormality. IMPRESSION:  Negative. Electronically Signed   By: Ulyses Jarred M.D.   On: 04/20/2021 02:28    Procedures Procedures   Medications Ordered in ED Medications - No data to display  ED Course  I have reviewed the triage vital signs and the nursing notes.  Pertinent labs & imaging results that were available during my care of the patient were reviewed by me and considered in my medical decision making (see chart for details).  50 year old here for evaluation of left knee pain which has been going on x1 year.  She is afebrile, nonseptic, non-ill-appearing.  She has no obvious effusion on exam.  No overlying erythema, warmth.  She has full range of motion without difficulty or without pain.  She is able to straight leg raise bilaterally.  Pelvis stable, nontender palpation.  She has negative anterior drawer.  No midline spinal tenderness.  No history of IV drug use, bowel or bladder incontinence, saddle paresthesia.  Low suspicion for acute neurosurgical or orthopedic emergency such as cauda equina, discitis, osteomyelitis, psoas abscess, gout, septic joint, VTE  Initially complaints of malodorous urine in triage however denies this with myself. Urine culture sent.  UA with trace leuks, rare bacteria, culture sent.  Given she denies any complaints with this hold on any antibiotics Pregnancy test negative DG pelvis with left hip without acute findings DG left knee without acute findings   Patient does not meet the SIRS or Sepsis criteria.  On repeat exam patient does not have a surgical abdomin and there are no peritoneal signs.  No indication of appendicitis, bowel obstruction, bowel perforation, cholecystitis, diverticulitis.   The patient has been appropriately medically screened and/or stabilized in the ED. I have low suspicion for any other emergent medical condition which would require further screening, evaluation or treatment in the ED or require inpatient management.  Patient is hemodynamically  stable and in no acute distress.  Patient able to ambulate in department prior to ED.  Evaluation does  not show acute pathology that would require ongoing or additional emergent interventions while in the emergency department or further inpatient treatment.  I have discussed the diagnosis with the patient and answered all questions.  Pain is been managed while in the emergency department and patient has no further complaints prior to discharge.  Patient is comfortable with plan discussed in room and is stable for discharge at this time.  I have discussed strict return precautions for returning to the emergency department.  Patient was encouraged to follow-up with PCP/specialist refer to at discharge.       MDM Rules/Calculators/A&P                           Final Clinical Impression(s) / ED Diagnoses Final diagnoses:  Chronic pain of left knee    Rx / DC Orders ED Discharge Orders          Ordered    naproxen (NAPROSYN) 500 MG tablet  2 times daily        04/20/21 1309             Anahy Esh A, PA-C 04/20/21 1317    Charlesetta Shanks, MD 04/29/21 [phone]

## 2021-04-20 NOTE — ED Notes (Signed)
RN reviewed discharge instructions w/ pt. Follow up and prescriptions reviewed, pt had no further questions °
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
## 2021-04-21 ENCOUNTER — Ambulatory Visit: Payer: Medicaid Other | Admitting: Family Medicine

## 2021-04-21 LAB — URINE CULTURE: Culture: 50000 — AB

## 2021-04-27 ENCOUNTER — Other Ambulatory Visit: Payer: Self-pay

## 2021-04-27 ENCOUNTER — Encounter: Payer: Self-pay | Admitting: Podiatry

## 2021-04-27 ENCOUNTER — Ambulatory Visit (INDEPENDENT_AMBULATORY_CARE_PROVIDER_SITE_OTHER): Payer: Medicaid Other | Admitting: Podiatry

## 2021-04-27 ENCOUNTER — Telehealth: Payer: Self-pay | Admitting: Podiatry

## 2021-04-27 DIAGNOSIS — G629 Polyneuropathy, unspecified: Secondary | ICD-10-CM

## 2021-04-27 DIAGNOSIS — B351 Tinea unguium: Secondary | ICD-10-CM

## 2021-04-27 MED ORDER — TERBINAFINE HCL 250 MG PO TABS: 250.0000 mg | ORAL_TABLET | Freq: Every day | ORAL | 0 refills | Status: DC

## 2021-04-27 NOTE — Progress Notes (Signed)
Subjective:   Patient ID: Anita Hill, female   DOB: 50 y.o.   MRN: [id number]   HPI Patient presents stating she is very concerned about thickened nailbeds 1 through 5 both feet which are difficult to wear shoe gear with and also that she is getting some tingling in her feet and hands.  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  Also went ahead today and placed her on Lamisil 250 daily for 90 days and I am getting liver function studies done.  Do not recommend treatment currently for neuropathy but did advise on neurologist at 1 point in future

## 2021-04-27 NOTE — Telephone Encounter (Signed)
Pt called stating she was seen today @ 1115 and called the pharmacy and they do not have a rx for her. Upon verifying the pharmacy pt uses [facility] [address] and it was sent to the wrong pharmacy. Can we change to the [facility] on [address] please and let pt know.

## 2021-04-27 NOTE — Progress Notes (Signed)
hep

## 2021-04-28 NOTE — Telephone Encounter (Signed)
Returned call to patient,(no answer, busy) to explain that per pharmacy,she may request /pick up from the prescription from any [facility],just give the medication name and they should be able to fill.

## 2021-04-29 ENCOUNTER — Ambulatory Visit: Payer: Medicaid Other | Admitting: Family Medicine

## 2021-05-08 ENCOUNTER — Ambulatory Visit: Payer: Medicaid Other | Admitting: Family Medicine

## 2021-05-08 ENCOUNTER — Other Ambulatory Visit: Payer: Self-pay

## 2021-05-08 ENCOUNTER — Ambulatory Visit (HOSPITAL_COMMUNITY)
Admission: EM | Admit: 2021-05-08 | Discharge: 2021-05-09 | Disposition: A | Discharge status: Other Acute Inpatient Hospital | Payer: Medicaid Other | Attending: Family | Admitting: Family

## 2021-05-08 DIAGNOSIS — Z79899 Other long term (current) drug therapy: Secondary | ICD-10-CM | POA: Diagnosis not present

## 2021-05-08 DIAGNOSIS — F3160 Bipolar disorder, current episode mixed, unspecified: Secondary | ICD-10-CM | POA: Diagnosis not present

## 2021-05-08 DIAGNOSIS — Z5900 Homelessness unspecified: Secondary | ICD-10-CM | POA: Insufficient documentation

## 2021-05-08 DIAGNOSIS — Z9114 Patient's other noncompliance with medication regimen: Secondary | ICD-10-CM | POA: Diagnosis not present

## 2021-05-08 DIAGNOSIS — Z791 Long term (current) use of non-steroidal anti-inflammatories (NSAID): Secondary | ICD-10-CM | POA: Insufficient documentation

## 2021-05-08 DIAGNOSIS — Z20822 Contact with and (suspected) exposure to covid-19: Secondary | ICD-10-CM | POA: Insufficient documentation

## 2021-05-08 DIAGNOSIS — F17210 Nicotine dependence, cigarettes, uncomplicated: Secondary | ICD-10-CM | POA: Insufficient documentation

## 2021-05-08 DIAGNOSIS — F3162 Bipolar disorder, current episode mixed, moderate: Secondary | ICD-10-CM

## 2021-05-08 LAB — COMPREHENSIVE METABOLIC PANEL
ALT: 19 U/L (ref 0–44)
AST: 23 U/L (ref 15–41)
Albumin: 3.9 g/dL (ref 3.5–5.0)
Alkaline Phosphatase: 65 U/L (ref 38–126)
Anion gap: 8 (ref 5–15)
BUN: 18 mg/dL (ref 6–20)
CO2: 25 mmol/L (ref 22–32)
Calcium: 9.3 mg/dL (ref 8.9–10.3)
Chloride: 107 mmol/L (ref 98–111)
Creatinine, Ser: 0.94 mg/dL (ref 0.44–1.00)
GFR, Estimated: >60 mL/min (ref >60)
Glucose, Bld: 112 mg/dL — ABNORMAL HIGH (ref 70–99)
Potassium: 3.6 mmol/L (ref 3.5–5.1)
Sodium: 140 mmol/L (ref 135–145)
Total Bilirubin: 0.6 mg/dL (ref 0.3–1.2)
Total Protein: 6.3 g/dL — ABNORMAL LOW (ref 6.5–8.1)

## 2021-05-08 LAB — POCT URINE DRUG SCREEN - MANUAL ENTRY (I-SCREEN)
POC Amphetamine UR: NOT DETECTED
POC Buprenorphine (BUP): NOT DETECTED
POC Cocaine UR: NOT DETECTED
POC Marijuana UR: NOT DETECTED
POC Methadone UR: NOT DETECTED
POC Methamphetamine UR: NOT DETECTED
POC Morphine: NOT DETECTED
POC Oxazepam (BZO): NOT DETECTED
POC Oxycodone UR: NOT DETECTED
POC Secobarbital (BAR): NOT DETECTED

## 2021-05-08 LAB — CBC WITH DIFFERENTIAL/PLATELET
Abs Immature Granulocytes: 0.02 10*3/uL (ref 0.00–0.07)
Basophils Absolute: 0 10*3/uL (ref 0.0–0.1)
Basophils Relative: 1 %
Eosinophils Absolute: 0.1 10*3/uL (ref 0.0–0.5)
Eosinophils Relative: 2 %
HCT: 39.2 % (ref 36.0–46.0)
Hemoglobin: 13.8 g/dL (ref 12.0–15.0)
Immature Granulocytes: 0 %
Lymphocytes Relative: 37 %
Lymphs Abs: 2.4 10*3/uL (ref 0.7–4.0)
MCH: 30.4 pg (ref 26.0–34.0)
MCHC: 35.2 g/dL (ref 30.0–36.0)
MCV: 86.3 fL (ref 80.0–100.0)
Monocytes Absolute: 0.5 10*3/uL (ref 0.1–1.0)
Monocytes Relative: 7 %
Neutro Abs: 3.4 10*3/uL (ref 1.7–7.7)
Neutrophils Relative %: 53 %
Platelets: 238 10*3/uL (ref 150–400)
RBC: 4.54 MIL/uL (ref 3.87–5.11)
RDW: 14.7 % (ref 11.5–15.5)
WBC: 6.5 10*3/uL (ref 4.0–10.5)
nRBC: 0 % (ref 0.0–0.2)

## 2021-05-08 LAB — ETHANOL: Alcohol, Ethyl (B): <10 mg/dL (ref <10)

## 2021-05-08 LAB — LIPID PANEL
Cholesterol: 201 mg/dL — ABNORMAL HIGH (ref 0–200)
HDL: 110 mg/dL (ref >40)
LDL Cholesterol: 78 mg/dL (ref 0–99)
Total CHOL/HDL Ratio: 1.8 RATIO
Triglycerides: 66 mg/dL (ref <150)
VLDL: 13 mg/dL (ref 0–40)

## 2021-05-08 LAB — POCT PREGNANCY, URINE: Preg Test, Ur: NEGATIVE

## 2021-05-08 LAB — POC SARS CORONAVIRUS 2 AG -  ED: SARS Coronavirus 2 Ag: NEGATIVE

## 2021-05-08 LAB — RESP PANEL BY RT-PCR (FLU A&B, COVID) ARPGX2
Influenza A by PCR: NEGATIVE
Influenza B by PCR: NEGATIVE
SARS Coronavirus 2 by RT PCR: NEGATIVE

## 2021-05-08 LAB — MAGNESIUM: Magnesium: 2 mg/dL (ref 1.7–2.4)

## 2021-05-08 MED ORDER — THIAMINE HCL 100 MG/ML IJ SOLN: 100.0000 mg | Freq: Once | INTRAMUSCULAR | Status: DC

## 2021-05-08 MED ORDER — HYDROXYZINE HCL 25 MG PO TABS
25.0000 mg | ORAL_TABLET | Freq: Four times a day (QID) | ORAL | Status: DC | PRN
  Administered 2021-05-08: 25 mg via ORAL
  Filled 2021-05-08: qty 1

## 2021-05-08 MED ORDER — ACETAMINOPHEN 325 MG PO TABS
650.0000 mg | ORAL_TABLET | Freq: Four times a day (QID) | ORAL | Status: DC | PRN
  Administered 2021-05-09: 650 mg via ORAL
  Filled 2021-05-08: qty 2

## 2021-05-08 MED ORDER — ALUM & MAG HYDROXIDE-SIMETH 200-200-20 MG/5ML PO SUSP: 30.0000 mL | ORAL | Status: DC | PRN

## 2021-05-08 MED ORDER — GABAPENTIN 300 MG PO CAPS
300.0000 mg | ORAL_CAPSULE | Freq: Every day | ORAL | Status: DC
  Administered 2021-05-08 – 2021-05-09 (×2): 300 mg via ORAL
  Filled 2021-05-08 (×2): qty 1

## 2021-05-08 MED ORDER — LORAZEPAM 1 MG PO TABS
1.0000 mg | ORAL_TABLET | ORAL | Status: AC | PRN
  Administered 2021-05-09: 1 mg via ORAL
  Filled 2021-05-08: qty 1

## 2021-05-08 MED ORDER — LOPERAMIDE HCL 2 MG PO CAPS: 2.0000 mg | ORAL_CAPSULE | ORAL | Status: DC | PRN

## 2021-05-08 MED ORDER — OLANZAPINE 5 MG PO TBDP: 5.0000 mg | ORAL_TABLET | Freq: Three times a day (TID) | ORAL | Status: DC | PRN

## 2021-05-08 MED ORDER — ONDANSETRON 4 MG PO TBDP: 4.0000 mg | ORAL_TABLET | Freq: Four times a day (QID) | ORAL | Status: DC | PRN

## 2021-05-08 MED ORDER — THIAMINE HCL 100 MG PO TABS
100.0000 mg | ORAL_TABLET | Freq: Every day | ORAL | Status: DC
  Administered 2021-05-09: 100 mg via ORAL
  Filled 2021-05-08: qty 1

## 2021-05-08 MED ORDER — LORAZEPAM 1 MG PO TABS: 1.0000 mg | ORAL_TABLET | Freq: Four times a day (QID) | ORAL | Status: DC | PRN

## 2021-05-08 MED ORDER — ADULT MULTIVITAMIN W/MINERALS CH
1.0000 | ORAL_TABLET | Freq: Every day | ORAL | Status: DC
  Administered 2021-05-08 – 2021-05-09 (×2): 1 via ORAL
  Filled 2021-05-08 (×2): qty 1

## 2021-05-08 MED ORDER — TRAZODONE HCL 50 MG PO TABS
50.0000 mg | ORAL_TABLET | Freq: Every evening | ORAL | Status: DC | PRN
  Administered 2021-05-08: 50 mg via ORAL
  Filled 2021-05-08: qty 1

## 2021-05-08 MED ORDER — THIAMINE HCL 100 MG PO TABS
100.0000 mg | ORAL_TABLET | Freq: Once | ORAL | Status: AC
  Administered 2021-05-08: 100 mg via ORAL
  Filled 2021-05-08: qty 1

## 2021-05-08 MED ORDER — MAGNESIUM HYDROXIDE 400 MG/5ML PO SUSP
30.0000 mL | Freq: Every day | ORAL | Status: DC | PRN
  Administered 2021-05-09: 30 mL via ORAL
  Filled 2021-05-08: qty 30

## 2021-05-08 MED ORDER — ZIPRASIDONE MESYLATE 20 MG IM SOLR: 20.0000 mg | INTRAMUSCULAR | Status: DC | PRN
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
## 2021-05-09 ENCOUNTER — Encounter (HOSPITAL_COMMUNITY): Payer: Self-pay | Admitting: Emergency Medicine

## 2021-05-09 ENCOUNTER — Inpatient Hospital Stay (HOSPITAL_COMMUNITY)
Admission: AD | Admit: 2021-05-09 | Discharge: 2021-05-18 | DRG: 885 | Disposition: A | Discharge status: Home / Self Care | Payer: Medicaid Other | Source: Intra-hospital | Attending: Emergency Medicine | Admitting: Emergency Medicine

## 2021-05-09 DIAGNOSIS — D573 Sickle-cell trait: Secondary | ICD-10-CM | POA: Diagnosis present

## 2021-05-09 DIAGNOSIS — F17210 Nicotine dependence, cigarettes, uncomplicated: Secondary | ICD-10-CM | POA: Diagnosis present

## 2021-05-09 DIAGNOSIS — F3162 Bipolar disorder, current episode mixed, moderate: Secondary | ICD-10-CM | POA: Diagnosis present

## 2021-05-09 DIAGNOSIS — Z888 Allergy status to other drugs, medicaments and biological substances status: Secondary | ICD-10-CM

## 2021-05-09 DIAGNOSIS — Z20822 Contact with and (suspected) exposure to covid-19: Secondary | ICD-10-CM | POA: Diagnosis present

## 2021-05-09 DIAGNOSIS — F3160 Bipolar disorder, current episode mixed, unspecified: Secondary | ICD-10-CM | POA: Diagnosis present

## 2021-05-09 DIAGNOSIS — Z91018 Allergy to other foods: Secondary | ICD-10-CM

## 2021-05-09 DIAGNOSIS — Z9114 Patient's other noncompliance with medication regimen: Secondary | ICD-10-CM

## 2021-05-09 DIAGNOSIS — F29 Unspecified psychosis not due to a substance or known physiological condition: Principal | ICD-10-CM | POA: Diagnosis present

## 2021-05-09 DIAGNOSIS — F200 Paranoid schizophrenia: Principal | ICD-10-CM | POA: Diagnosis present

## 2021-05-09 LAB — HEMOGLOBIN A1C
Hgb A1c MFr Bld: 6 % — ABNORMAL HIGH (ref 4.8–5.6)
Mean Plasma Glucose: 125.5 mg/dL

## 2021-05-09 LAB — HIV ANTIBODY (ROUTINE TESTING W REFLEX): HIV Screen 4th Generation wRfx: NONREACTIVE

## 2021-05-09 LAB — TSH: TSH: 3.006 u[IU]/mL (ref 0.350–4.500)

## 2021-05-09 MED ORDER — MAGNESIUM HYDROXIDE 400 MG/5ML PO SUSP
30.0000 mL | Freq: Every day | ORAL | Status: DC | PRN
  Administered 2021-05-14 – 2021-05-15 (×2): 30 mL via ORAL
  Filled 2021-05-09 (×2): qty 30

## 2021-05-09 MED ORDER — ONDANSETRON 4 MG PO TBDP: 4.0000 mg | ORAL_TABLET | Freq: Four times a day (QID) | ORAL | Status: AC | PRN

## 2021-05-09 MED ORDER — NICOTINE 21 MG/24HR TD PT24
MEDICATED_PATCH | TRANSDERMAL | Status: AC
  Administered 2021-05-09: 21 mg via TRANSDERMAL
  Filled 2021-05-09: qty 1

## 2021-05-09 MED ORDER — LORAZEPAM 1 MG PO TABS
1.0000 mg | ORAL_TABLET | Freq: Four times a day (QID) | ORAL | Status: AC | PRN
  Filled 2021-05-09: qty 1

## 2021-05-09 MED ORDER — HYDROXYZINE HCL 25 MG PO TABS: 25.0000 mg | ORAL_TABLET | Freq: Three times a day (TID) | ORAL | Status: DC | PRN

## 2021-05-09 MED ORDER — TRAZODONE HCL 50 MG PO TABS
50.0000 mg | ORAL_TABLET | Freq: Every evening | ORAL | Status: DC | PRN
  Administered 2021-05-10 – 2021-05-13 (×4): 50 mg via ORAL
  Filled 2021-05-09 (×5): qty 1

## 2021-05-09 MED ORDER — ACETAMINOPHEN 325 MG PO TABS
650.0000 mg | ORAL_TABLET | Freq: Four times a day (QID) | ORAL | Status: DC | PRN
  Administered 2021-05-12 – 2021-05-16 (×2): 650 mg via ORAL
  Filled 2021-05-09: qty 2

## 2021-05-09 MED ORDER — ALUM & MAG HYDROXIDE-SIMETH 200-200-20 MG/5ML PO SUSP: 30.0000 mL | ORAL | Status: DC | PRN

## 2021-05-09 MED ORDER — NICOTINE 21 MG/24HR TD PT24: 21.0000 mg | MEDICATED_PATCH | Freq: Every day | TRANSDERMAL | Status: DC

## 2021-05-09 MED ORDER — LOPERAMIDE HCL 2 MG PO CAPS: 2.0000 mg | ORAL_CAPSULE | ORAL | Status: AC | PRN

## 2021-05-09 MED ORDER — HYDROXYZINE HCL 25 MG PO TABS
25.0000 mg | ORAL_TABLET | Freq: Four times a day (QID) | ORAL | Status: DC | PRN
  Filled 2021-05-09: qty 1

## 2021-05-09 MED ORDER — THIAMINE HCL 100 MG PO TABS
100.0000 mg | ORAL_TABLET | Freq: Every day | ORAL | Status: DC
  Administered 2021-05-10 – 2021-05-18 (×9): 100 mg via ORAL
  Filled 2021-05-09 (×11): qty 1

## 2021-05-09 MED ORDER — ADULT MULTIVITAMIN W/MINERALS CH
1.0000 | ORAL_TABLET | Freq: Every day | ORAL | Status: DC
  Administered 2021-05-10 – 2021-05-18 (×9): 1 via ORAL
  Filled 2021-05-09 (×11): qty 1

## 2021-05-09 NOTE — ED Notes (Signed)
[employer] transport requested.

## 2021-05-09 NOTE — Progress Notes (Signed)
Patient ID: Anita Hill, female   DOB: 12/19/1970, 50 y.o.   MRN: [id number] Patient has been accepted to [facility] 406-1. She may be transported to [facility] after 1930. Please call report to [phone] adult unit.
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
## 2021-05-09 NOTE — ED Notes (Signed)
Pt sleeping at present, no distress noted.  Monitoring for safety. 

## 2021-05-09 NOTE — ED Notes (Signed)
Report called to RN Kathlee Nations, [facility] rm [phone].  Pending [facility] transfer at 10pm.

## 2021-05-09 NOTE — ED Notes (Signed)
GIVEN LUNCH AND SNACK

## 2021-05-09 NOTE — ED Notes (Signed)
Pt A&O x 4, resting at present, watching TV at present.  Pt calm & cooperative.  Pt is IVC, pending report to [facility] and [facility] transfer after 8pm.

## 2021-05-09 NOTE — ED Notes (Signed)
Sandwich and coffee provided.

## 2021-05-09 NOTE — ED Notes (Signed)
Report attempted x 1. Unsuccessful.  Will try again.

## 2021-05-09 NOTE — ED Notes (Signed)
Pt asleep with even and unlabored respirations. No distress or discomfort noted. Pt remains safe on the unit. Will continue to monitor. 

## 2021-05-09 NOTE — ED Notes (Signed)
Pt resting on pull out bed w/o any s&S of distress. Safety maintained and will continue to monitor.

## 2021-05-10 ENCOUNTER — Encounter: Payer: Self-pay | Admitting: Urology

## 2021-05-10 ENCOUNTER — Encounter (HOSPITAL_COMMUNITY): Payer: Self-pay | Admitting: Behavioral Health

## 2021-05-10 ENCOUNTER — Other Ambulatory Visit: Payer: Self-pay

## 2021-05-10 DIAGNOSIS — F29 Unspecified psychosis not due to a substance or known physiological condition: Principal | ICD-10-CM

## 2021-05-10 DIAGNOSIS — F22 Delusional disorders: Secondary | ICD-10-CM | POA: Insufficient documentation

## 2021-05-10 MED ORDER — NICOTINE POLACRILEX 2 MG MT GUM
CHEWING_GUM | OROMUCOSAL | Status: AC
  Administered 2021-05-10: 2 mg
  Filled 2021-05-10: qty 1

## 2021-05-10 MED ORDER — HALOPERIDOL LACTATE 5 MG/ML IJ SOLN
INTRAMUSCULAR | Status: AC
  Filled 2021-05-10: qty 1

## 2021-05-10 MED ORDER — NICOTINE 21 MG/24HR TD PT24
21.0000 mg | MEDICATED_PATCH | Freq: Every day | TRANSDERMAL | Status: DC
  Administered 2021-05-10: 21 mg via TRANSDERMAL
  Filled 2021-05-10 (×3): qty 1

## 2021-05-10 MED ORDER — LORAZEPAM 1 MG PO TABS
ORAL_TABLET | ORAL | Status: AC
  Administered 2021-05-10: 2 mg via ORAL
  Filled 2021-05-10: qty 2

## 2021-05-10 MED ORDER — HALOPERIDOL 5 MG PO TABS
5.0000 mg | ORAL_TABLET | Freq: Once | ORAL | Status: AC
  Filled 2021-05-10: qty 1

## 2021-05-10 MED ORDER — HALOPERIDOL 5 MG PO TABS
5.0000 mg | ORAL_TABLET | Freq: Three times a day (TID) | ORAL | Status: DC | PRN
  Administered 2021-05-10 – 2021-05-17 (×7): 5 mg via ORAL
  Filled 2021-05-10 (×6): qty 1

## 2021-05-10 MED ORDER — DIPHENHYDRAMINE HCL 25 MG PO CAPS
ORAL_CAPSULE | ORAL | Status: AC
  Administered 2021-05-10: 50 mg via ORAL
  Filled 2021-05-10: qty 2

## 2021-05-10 MED ORDER — HALOPERIDOL LACTATE 5 MG/ML IJ SOLN
5.0000 mg | Freq: Three times a day (TID) | INTRAMUSCULAR | Status: DC | PRN
  Filled 2021-05-10: qty 1

## 2021-05-10 MED ORDER — HALOPERIDOL 5 MG PO TABS: 5.0000 mg | ORAL_TABLET | Freq: Once | ORAL | Status: AC

## 2021-05-10 MED ORDER — DIPHENHYDRAMINE HCL 50 MG PO CAPS
50.0000 mg | ORAL_CAPSULE | Freq: Once | ORAL | Status: AC
  Filled 2021-05-10: qty 1

## 2021-05-10 MED ORDER — LORAZEPAM 2 MG/ML IJ SOLN
INTRAMUSCULAR | Status: AC
  Filled 2021-05-10: qty 1

## 2021-05-10 MED ORDER — DIPHENHYDRAMINE HCL 50 MG/ML IJ SOLN
INTRAMUSCULAR | Status: AC
  Filled 2021-05-10: qty 1

## 2021-05-10 MED ORDER — LORAZEPAM 1 MG PO TABS
1.0000 mg | ORAL_TABLET | Freq: Four times a day (QID) | ORAL | Status: DC | PRN
  Administered 2021-05-10 – 2021-05-11 (×2): 1 mg via ORAL
  Filled 2021-05-10: qty 1

## 2021-05-10 MED ORDER — LAMOTRIGINE 25 MG PO TABS
25.0000 mg | ORAL_TABLET | Freq: Two times a day (BID) | ORAL | Status: DC
  Filled 2021-05-10 (×3): qty 1

## 2021-05-10 MED ORDER — DIPHENHYDRAMINE HCL 50 MG/ML IJ SOLN
50.0000 mg | Freq: Four times a day (QID) | INTRAMUSCULAR | Status: DC | PRN
  Administered 2021-05-15: 50 mg via INTRAMUSCULAR
  Filled 2021-05-10: qty 1

## 2021-05-10 MED ORDER — LORAZEPAM 2 MG/ML IJ SOLN: 1.0000 mg | Freq: Four times a day (QID) | INTRAMUSCULAR | Status: DC | PRN

## 2021-05-10 MED ORDER — DIPHENHYDRAMINE HCL 50 MG PO CAPS: 50.0000 mg | ORAL_CAPSULE | Freq: Once | ORAL | Status: AC

## 2021-05-10 MED ORDER — METFORMIN HCL 500 MG PO TABS
500.0000 mg | ORAL_TABLET | Freq: Two times a day (BID) | ORAL | Status: DC
  Administered 2021-05-10: 500 mg via ORAL
  Filled 2021-05-10 (×3): qty 1

## 2021-05-10 MED ORDER — HALOPERIDOL 5 MG PO TABS
ORAL_TABLET | ORAL | Status: AC
  Administered 2021-05-10: 5 mg via ORAL
  Filled 2021-05-10: qty 1

## 2021-05-10 MED ORDER — NICOTINE POLACRILEX 2 MG MT GUM
2.0000 mg | CHEWING_GUM | OROMUCOSAL | Status: DC | PRN
  Administered 2021-05-11 – 2021-05-17 (×11): 2 mg via ORAL
  Filled 2021-05-10: qty 10
  Filled 2021-05-10: qty 1
  Filled 2021-05-10: qty 10
  Filled 2021-05-10: qty 1

## 2021-05-10 MED ORDER — LORAZEPAM 1 MG PO TABS: 2.0000 mg | ORAL_TABLET | Freq: Once | ORAL | Status: AC

## 2021-05-10 MED ORDER — DIVALPROEX SODIUM ER 250 MG PO TB24
750.0000 mg | ORAL_TABLET | Freq: Every day | ORAL | Status: DC
  Administered 2021-05-10: 750 mg via ORAL
  Filled 2021-05-10 (×4): qty 3

## 2021-05-10 MED ORDER — OLANZAPINE 10 MG PO TBDP
10.0000 mg | ORAL_TABLET | Freq: Every day | ORAL | Status: DC
  Administered 2021-05-10: 10 mg via ORAL
  Filled 2021-05-10 (×4): qty 1

## 2021-05-10 MED ORDER — DIPHENHYDRAMINE HCL 25 MG PO CAPS
25.0000 mg | ORAL_CAPSULE | Freq: Four times a day (QID) | ORAL | Status: DC | PRN
  Administered 2021-05-10 – 2021-05-17 (×6): 25 mg via ORAL
  Filled 2021-05-10 (×6): qty 1

## 2021-05-10 NOTE — Progress Notes (Signed)
Pt requested agitation/anxiety medication after talking to daughter. "What you gave me earlier worked".
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
## 2021-05-10 NOTE — Tx Team (Signed)
Initial Treatment Plan 05/10/2021 1:14 AM [name] SFS:[id number]    PATIENT STRESSORS: Marital or family conflict Medication change or noncompliance Substance abuse   PATIENT STRENGTHS: Active sense of humor Average or above average intelligence Communication skills Physical Health Supportive family/friends   PATIENT IDENTIFIED PROBLEMS: Psychosis  Substance abuse          Pt states her goal here is to "not gain too much weight"         DISCHARGE CRITERIA:  Adequate post-discharge living arrangements Improved stabilization in mood, thinking, and/or behavior Motivation to continue treatment in a less acute level of care Need for constant or close observation no longer present Verbal commitment to aftercare and medication compliance  PRELIMINARY DISCHARGE PLAN: Outpatient therapy Placement in alternative living arrangements  PATIENT/FAMILY INVOLVEMENT: This treatment plan has been presented to and reviewed with the patient, Anita Hill The patient and family have been given the opportunity to ask questions and make suggestions.  Margaretann Loveless, RN 05/10/2021, 1:14 AM

## 2021-05-10 NOTE — Progress Notes (Signed)
Pt's daughter is interim guardian. Paperwork on chart. Contact: [phone]    05/10/21 1742  Legal Guardian  Does Patient Have a [place]? Yes  Legal Guardian Other:  Legal Guardian Contact Information Daughter: Anita Hill  Copy of Legal Guardianship Form in Chart Yes  Legal Guardian Notified of Arrival  Successfully notified

## 2021-05-10 NOTE — Plan of Care (Signed)
  Problem: Education: Goal: Knowledge of [facility] Education information/materials will improve Outcome: Progressing Goal: Emotional status will improve Outcome: Progressing Goal: Mental status will improve Outcome: Progressing   

## 2021-05-10 NOTE — Progress Notes (Signed)
Pt did not attend group. 
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
## 2021-05-10 NOTE — Progress Notes (Signed)
Progress note  Pt found in bed; compliant with medication administration. Pt had complaints of withdrawal symptoms from nicotine. Supplementation ordered. Pt had an episode at lunch where PRN 1x medication was needed. Pt took oral meds willingly. Pt voiced their concerns as to why they were agitated and most were valid and reality based. Pt stated the daughter had made them become agitated with their requests/demands. Pt denies si/hi/ah/vh and verbally agrees to approach staff if these become apparent or before harming themselves/others while at [place].  A: Pt provided support and encouragement. Pt given medication per protocol and standing orders. Q45m safety checks implemented and continued.  R: Pt safe on the unit. Will continue to monitor.

## 2021-05-10 NOTE — BHH Group Notes (Signed)
LCSW Group Therapy Note  Type of Therapy and Topic:  Group Therapy - Healthy vs Unhealthy Coping Skills  Participation Level:  Active  Description of Group The focus of this group was to determine what unhealthy coping techniques typically are used by group members and what healthy coping techniques would be helpful in coping with various problems. Patients were guided in becoming aware of the differences between healthy and unhealthy coping techniques. Patients were asked to identify 2-3 healthy coping skills they would like to learn to use more effectively.  Therapeutic Goals 1. Patients learned that coping is what human beings do all day long to deal with various situations in their lives 2. Patients defined and discussed healthy vs unhealthy coping techniques 3. Patients identified their preferred coping techniques and identified whether these were healthy or unhealthy 4. Patients determined 2-3 healthy coping skills they would like to become more familiar with and use more often. 5. Patients provided support and ideas to each other   Summary of Patient Progress:   Pt was appropriate, active, and attentive during group discussion.   Therapeutic Modalities Cognitive Behavioral Therapy Motivational Interviewing  Eliott Nine 05/10/2021  11:08 AM

## 2021-05-10 NOTE — BHH Counselor (Signed)
Anita Hill (daughter/guardain) [phone] states that she is pt's guardian and will bring paperwork by [facility] on the afternoon of 05/10/21.

## 2021-05-11 DIAGNOSIS — F29 Unspecified psychosis not due to a substance or known physiological condition: Secondary | ICD-10-CM | POA: Diagnosis not present

## 2021-05-11 LAB — PROLACTIN: Prolactin: 2.6 ng/mL — ABNORMAL LOW (ref 4.8–23.3)

## 2021-05-11 MED ORDER — DIVALPROEX SODIUM 500 MG PO DR TAB
500.0000 mg | DELAYED_RELEASE_TABLET | Freq: Two times a day (BID) | ORAL | Status: DC
  Administered 2021-05-11 – 2021-05-13 (×4): 500 mg via ORAL
  Filled 2021-05-11 (×9): qty 1

## 2021-05-11 MED ORDER — OLANZAPINE 5 MG PO TBDP
15.0000 mg | ORAL_TABLET | Freq: Every day | ORAL | Status: DC
  Administered 2021-05-11 – 2021-05-12 (×2): 15 mg via ORAL
  Filled 2021-05-11 (×5): qty 3

## 2021-05-11 MED ORDER — BENZTROPINE MESYLATE 0.5 MG PO TABS: 0.5000 mg | ORAL_TABLET | Freq: Four times a day (QID) | ORAL | Status: DC | PRN

## 2021-05-11 NOTE — Progress Notes (Signed)
[place] Group Notes:  (Nursing/MHT/Case Management/Adjunct)  Date:  05/11/2021  Time:  2015  Type of Therapy:   wrap up group  Participation Level:  Active  Participation Quality:  Appropriate, Attentive, Sharing, and Supportive  Affect:  Appropriate  Cognitive:  Alert  Insight:  Good  Engagement in Group:  Engaged  Modes of Intervention:  Clarification, Education, and Support  Summary of Progress/Problems: Positive thinking and positive change were discussed. Pt has identified a need for change and began to make a plan and schedule to put into action upon discharge.   Shellia Cleverly 05/11/2021, 9:30 PM

## 2021-05-11 NOTE — Progress Notes (Signed)

## 2021-05-11 NOTE — Progress Notes (Signed)
Progress note  Pt awoke and was complaint with medication pass. Pt had complaints of anxiety/agitation. Pt provided medication. Pt states that this is revolving around the ivc process and getting their medications right. Pt was educated on long acting injectables. Pt seemed interested and stated they would consider this. Pt taking shower now. Pt states they didn't sleep well but they felt this was from the room being too warm. Pt pleasant. Pt denies si/hi/ah/vh and verbally agrees to approach staff if these become apparent or before harming themselves/others while at [place].  A: Pt provided support and encouragement. Pt given medication per protocol and standing orders. Q45m safety checks implemented and continued.  R: Pt safe on the unit. Will continue to monitor.

## 2021-05-11 NOTE — BHH Group Notes (Signed)
Type of Therapy and Topic:  Group Therapy - Healthy vs Unhealthy Coping Skills  Participation Level: Active  Description of Group The focus of this group was to determine what unhealthy coping techniques typically are used by group members and what healthy coping techniques would be helpful in coping with various problems. Patients were guided in becoming aware of the differences between healthy and unhealthy coping techniques. Patients were asked to identify 2-3 healthy coping skills they would like to learn to use more effectively.  Therapeutic Goals 1. Patients learned that coping is what human beings do all day long to deal with various situations in their lives 2. Patients defined and discussed healthy vs unhealthy coping techniques 3. Patients identified their preferred coping techniques and identified whether these were healthy or unhealthy 4. Patients determined 2-3 healthy coping skills they would like to become more familiar with and use more often. 5. Patients provided support and ideas to each other   Summary of Patient Progress: Due to the acuity and complex discharge plans, group was not held. Patient was provided therapeutic worksheets and asked to meet with CSW as needed.

## 2021-05-11 NOTE — BHH Group Notes (Signed)
Occupational Therapy Group Note Date: 05/11/2021 Group Topic/Focus: Feelings Management  Group Description: Group encouraged increased engagement and participation through discussion focused on Building Happiness. Patients were provided a handout and reviewed therapeutic strategies to build happiness including identifying gratitudes, random acts of kindness, exercise, meditation, positive journaling, and fostering relationships. Patients engaged in discussion and encouraged to reflect on each strategy and their experiences.  Therapeutic Goal(s): Identify strategies to build happiness. Identify and implement therapeutic strategies to improve overall mood. Practice and identify gratitudes, random acts of kindness, exercise, meditation, positive journaling, and fostering relationships Participation Level: Patient did not attend OT group session despite personal invitation.    Plan: Continue to engage patient in OT groups 2 - 3x/week.  05/11/2021  Eathen Budreau, MOT, OTR/L 

## 2021-05-11 NOTE — Plan of Care (Signed)
  Problem: Education: Goal: Verbalization of understanding the information provided will improve Outcome: Progressing   Problem: Activity: Goal: Sleeping patterns will improve Outcome: Progressing   Problem: Coping: Goal: Ability to demonstrate self-control will improve Outcome: Progressing

## 2021-05-11 NOTE — BHH Counselor (Signed)
Interim Guardianship papers were placed in the patient's chart and added to Epic to show that Anita Hill (daughter) has current Guardianship of her mother Anita Hill.

## 2021-05-11 NOTE — Progress Notes (Signed)
Patient c/o of sleep distrubances at HS prn Trazodone 50 mg PO administered at 2132 and effective by 2200. Patient denies SI/HI/A/VH and verbally contracted for safety. Patient remains minimal and guarded. Support and encouragement provided as needed.    05/11/21 0000  Psych Admission Type (Psych Patients Only)  Admission Status Involuntary  Psychosocial Assessment  Patient Complaints Anxiety;Sleep disturbance  Eye Contact Fair  Facial Expression Anxious;Pensive  Affect Anxious;Appropriate to circumstance;Preoccupied  Speech Logical/coherent  Interaction Cautious;Forwards little;Guarded;Minimal  Motor Activity Slow  Appearance/Hygiene Unremarkable  Behavior Characteristics Cooperative  Mood Anxious  Thought Process  Coherency Concrete thinking  Content Blaming others  Delusions Paranoid  Perception WDL  Hallucination None reported or observed  Judgment Poor  Confusion None  Danger to Self  Current suicidal ideation? Denies  Danger to Others  Danger to Others None reported or observed
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
## 2021-05-11 NOTE — Tx Team (Signed)
Interdisciplinary Treatment and Diagnostic Plan Update  05/11/2021 Time of Session: 11:30am Anita Hill MRN: [id number]  Principal Diagnosis: Schizophrenia spectrum disorder with psychotic disorder type not yet determined [facility])  Secondary Diagnoses: Principal Problem:   Schizophrenia spectrum disorder with psychotic disorder type not yet determined ([place])   Current Medications:  Current Facility-Administered Medications  Medication Dose Route Frequency Provider Last Rate Last Admin   acetaminophen (TYLENOL) tablet 650 mg  650 mg Oral Q6H PRN Ajibola, Ene A, NP       alum & mag hydroxide-simeth (MAALOX/MYLANTA) 200-200-20 MG/5ML suspension 30 mL  30 mL Oral Q4H PRN Ajibola, Ene A, NP       diphenhydrAMINE (BENADRYL) capsule 25 mg  25 mg Oral Q6H PRN Harlow Asa, MD   25 mg at 05/11/21 1047   Or   diphenhydrAMINE (BENADRYL) injection 50 mg  50 mg Intramuscular Q6H PRN Harlow Asa, MD       divalproex (DEPAKOTE ER) 24 hr tablet 750 mg  750 mg Oral QHS Nelda Marseille, Amy E, MD   750 mg at 05/10/21 2131   haloperidol (HALDOL) tablet 5 mg  5 mg Oral Q8H PRN Harlow Asa, MD   5 mg at 05/11/21 1047   Or   haloperidol lactate (HALDOL) injection 5 mg  5 mg Intramuscular Q8H PRN Harlow Asa, MD       loperamide (IMODIUM) capsule 2-4 mg  2-4 mg Oral PRN Ajibola, Ene A, NP       LORazepam (ATIVAN) tablet 1 mg  1 mg Oral Q6H PRN Harlow Asa, MD   1 mg at 05/11/21 1047   Or   LORazepam (ATIVAN) injection 1 mg  1 mg Intramuscular Q6H PRN Nelda Marseille, Amy E, MD       LORazepam (ATIVAN) tablet 1 mg  1 mg Oral Q6H PRN Ajibola, Ene A, NP       magnesium hydroxide (MILK OF MAGNESIA) suspension 30 mL  30 mL Oral Daily PRN Ajibola, Ene A, NP       multivitamin with minerals tablet 1 tablet  1 tablet Oral Daily Ajibola, Ene A, NP   1 tablet at 05/11/21 1047   nicotine polacrilex (NICORETTE) gum 2 mg  2 mg Oral PRN Harlow Asa, MD   2 mg at 05/11/21 1047   OLANZapine zydis (ZYPREXA)  disintegrating tablet 10 mg  10 mg Oral QHS Nelda Marseille, Amy E, MD   10 mg at 05/10/21 2132   ondansetron (ZOFRAN-ODT) disintegrating tablet 4 mg  4 mg Oral Q6H PRN Ajibola, Ene A, NP       thiamine tablet 100 mg  100 mg Oral Daily Ajibola, Ene A, NP   100 mg at 05/11/21 1047   traZODone (DESYREL) tablet 50 mg  50 mg Oral QHS PRN Ajibola, Ene A, NP   50 mg at 05/10/21 2132   PTA Medications: Medications Prior to Admission  Medication Sig Dispense Refill Last Dose   cyclobenzaprine (FLEXERIL) 10 MG tablet Take 1 tablet (10 mg total) by mouth 2 (two) times daily as needed for muscle spasms. (Patient not taking: Reported on 05/09/2021) 20 tablet 0    hydrocortisone cream 1 % Apply to affected area 2 times daily 14.2 g 0    ibuprofen (ADVIL) 600 MG tablet Take 1 tablet (600 mg total) by mouth every 6 (six) hours as needed. (Patient not taking: Reported on 05/09/2021) 30 tablet 0    lidocaine (LIDODERM) 5 % Place 1 patch onto the  skin daily. Remove & Discard patch within 12 hours or as directed by MD (Patient not taking: Reported on 05/09/2021) 30 patch 0    naproxen (NAPROSYN) 500 MG tablet Take 1 tablet (500 mg total) by mouth 2 (two) times daily. (Patient not taking: Reported on 05/09/2021) 30 tablet 0    predniSONE (STERAPRED UNI-PAK 21 TAB) 10 MG (21) TBPK tablet Take by mouth daily. Take 6 tabs by mouth daily  for 2 days, then 5 tabs for 2 days, then 4 tabs for 2 days, then 3 tabs for 2 days, 2 tabs for 2 days, then 1 tab by mouth daily for 2 days (Patient not taking: Reported on 05/09/2021) 42 tablet 0    terbinafine (LAMISIL) 250 MG tablet Take 1 tablet (250 mg total) by mouth daily. 90 tablet 0     Patient Stressors: Marital or family conflict Medication change or noncompliance Substance abuse  Patient Strengths: Active sense of humor Average or above average intelligence Communication skills Physical Health Supportive family/friends  Treatment Modalities: Medication Management, Group therapy,  Case management,  1 to 1 session with clinician, Psychoeducation, Recreational therapy.   Physician Treatment Plan for Primary Diagnosis: Schizophrenia spectrum disorder with psychotic disorder type not yet determined ([place]) Long Term Goal(s): Improvement in symptoms so as ready for discharge   Short Term Goals: Ability to identify changes in lifestyle to reduce recurrence of condition will improve Ability to disclose and discuss suicidal ideas Ability to maintain clinical measurements within normal limits will improve Compliance with prescribed medications will improve Ability to identify triggers associated with substance abuse/mental health issues will improve  Medication Management: Evaluate patient's response, side effects, and tolerance of medication regimen.  Therapeutic Interventions: 1 to 1 sessions, Unit Group sessions and Medication administration.  Evaluation of Outcomes: Not progressing  Physician Treatment Plan for Secondary Diagnosis: Principal Problem:   Schizophrenia spectrum disorder with psychotic disorder type not yet determined ([place])  Long Term Goal(s): Improvement in symptoms so as ready for discharge   Short Term Goals: Ability to identify changes in lifestyle to reduce recurrence of condition will improve Ability to disclose and discuss suicidal ideas Ability to maintain clinical measurements within normal limits will improve Compliance with prescribed medications will improve Ability to identify triggers associated with substance abuse/mental health issues will improve     Medication Management: Evaluate patient's response, side effects, and tolerance of medication regimen.  Therapeutic Interventions: 1 to 1 sessions, Unit Group sessions and Medication administration.  Evaluation of Outcomes: Not Progressing   RN Treatment Plan for Primary Diagnosis: Schizophrenia spectrum disorder with psychotic disorder type not yet determined ([place]) Long Term Goal(s):  Knowledge of disease and therapeutic regimen to maintain health will improve  Short Term Goals: Ability to verbalize frustration and anger appropriately will improve, Ability to demonstrate self-control, Ability to participate in decision making will improve, Ability to verbalize feelings will improve, Ability to identify and develop effective coping behaviors will improve, and Compliance with prescribed medications will improve  Medication Management: RN will administer medications as ordered by provider, will assess and evaluate patient's response and provide education to patient for prescribed medication. RN will report any adverse and/or side effects to prescribing provider.  Therapeutic Interventions: 1 on 1 counseling sessions, Psychoeducation, Medication administration, Evaluate responses to treatment, Monitor vital signs and CBGs as ordered, Perform/monitor CIWA, COWS, AIMS and Fall Risk screenings as ordered, Perform wound care treatments as ordered.  Evaluation of Outcomes: Not Progressing   LCSW Treatment Plan for Primary  Diagnosis: Schizophrenia spectrum disorder with psychotic disorder type not yet determined [facility]) Long Term Goal(s): Safe transition to appropriate next level of care at discharge, Engage patient in therapeutic group addressing interpersonal concerns.  Short Term Goals: Engage patient in aftercare planning with referrals and resources, Increase ability to appropriately verbalize feelings, Increase emotional regulation, Facilitate acceptance of mental health diagnosis and concerns, Facilitate patient progression through stages of change regarding substance use diagnoses and concerns, Identify triggers associated with mental health/substance abuse issues, and Increase skills for wellness and recovery  Therapeutic Interventions: Assess for all discharge needs, 1 to 1 time with Social worker, Explore available resources and support systems, Assess for adequacy in community  support network, Educate family and significant other(s) on suicide prevention, Complete Psychosocial Assessment, Interpersonal group therapy.  Evaluation of Outcomes: Not Progressing   Progress in Treatment: Attending groups: No. Participating in groups: No. Taking medication as prescribed: Yes. Toleration medication: Yes. Family/Significant other contact made: No, will contact:  patient guardian Patient understands diagnosis: No. Discussing patient identified problems/goals with staff: No. Medical problems stabilized or resolved: Yes. Denies suicidal/homicidal ideation: Yes. Issues/concerns per patient self-inventory: No.   New problem(s) identified: No, Describe:  N/A  New Short Term/Long Term Goal(s):   medication stabilization, elimination of SI thoughts, development of comprehensive mental wellness plan.    Patient Goals:  Patient states, "I want to go home"  Discharge Plan or Barriers: Patient recently admitted. CSW will continue to follow and assess for appropriate referrals and possible discharge planning.    Reason for Continuation of Hospitalization: Delusions  Hallucinations Medication stabilization  Estimated Length of Stay:  Attendees: Patient: Anita Hill 05/11/2021 11:25am  Physician: Viann Fish, MD 05/11/2021 , 11:25am  Nursing:    RN Care Manager:   Social Worker: Riki Altes, LCSW 05/11/2021, 11:25am  Recreational Therapist:    Other:    Other:    Other:     Scribe for Treatment Team: Zachery Conch, LCSW 05/11/2021 2:56 PM

## 2021-05-12 MED ORDER — OLANZAPINE 5 MG PO TBDP
5.0000 mg | ORAL_TABLET | Freq: Every day | ORAL | Status: DC
  Administered 2021-05-12 – 2021-05-14 (×3): 5 mg via ORAL
  Filled 2021-05-12 (×7): qty 1
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
## 2021-05-12 NOTE — Progress Notes (Signed)
Pt visible on the unit some, pt stated she felt better since coming in the hospital. Pt given PRN Trazodone per [facility] with HS medication    05/12/21 2300  Psych Admission Type (Psych Patients Only)  Admission Status Involuntary  Psychosocial Assessment  Patient Complaints Anxiety  Eye Contact Fair  Facial Expression Anxious;Worried  Affect Anxious;Appropriate to circumstance  Speech Logical/coherent  Interaction Assertive  Motor Activity Other (Comment) (WDL)  Appearance/Hygiene Unremarkable  Behavior Characteristics Cooperative  Mood Depressed;Sad  Thought Pension scheme manager thinking  Content Blaming others;Paranoia  Delusions Paranoid  Perception WDL  Hallucination None reported or observed  Judgment Poor  Confusion None  Danger to Self  Current suicidal ideation? Denies  Danger to Others  Danger to Others None reported or observed

## 2021-05-12 NOTE — Progress Notes (Signed)
Adult Psychoeducational Group Note  Date:  05/12/2021 Time:  9:50 PM  Group Topic/Focus:  Wrap-Up Group:   The focus of this group is to help patients review their daily goal of treatment and discuss progress on daily workbooks.  Participation Level:  Active  Participation Quality:  Appropriate  Affect:  Appropriate  Cognitive:  Appropriate  Insight: Appropriate  Engagement in Group:  Engaged  Modes of Intervention:  Education  Additional Comments:  Patient attended and participated in group tonight. She reports that the significant thing that happen today was the doctor made her believe she will be discharge soon.  Salley [facility] 05/12/2021, 9:50 PM

## 2021-05-12 NOTE — BHH Group Notes (Signed)
Patient did not attend group.   ADULT GRIEF GROUP NOTE:   Spiritual care group on grief and loss facilitated by chaplain Janne Napoleon, [facility]   Group Goal:   Support / Education around grief and loss   Members engage in facilitated group support and psycho-social education.   Group Description:   Following introductions and group rules, group members engaged in facilitated group dialog and support around topic of loss, with particular support around experiences of loss in their lives. Group Identified types of loss (relationships / self / things) and identified patterns, circumstances, and changes that precipitate losses. Reflected on thoughts / feelings around loss, normalized grief responses, and recognized variety in grief experience. Group noted Worden's four tasks of grief in discussion.   Group drew on Adlerian / Rogerian, narrative, MI,

## 2021-05-12 NOTE — Progress Notes (Signed)
   05/11/21 2152  Psych Admission Type (Psych Patients Only)  Admission Status Involuntary  Psychosocial Assessment  Patient Complaints Agitation;Anxiety  Eye Contact Fair  Facial Expression Anxious;Worried  Affect Anxious;Appropriate to circumstance  Speech Logical/coherent  Interaction Assertive  Motor Activity Other (Comment) (WDL)  Appearance/Hygiene Unremarkable  Behavior Characteristics Cooperative;Appropriate to situation  Mood Depressed;Sad  Thought Pension scheme manager thinking  Content Blaming others;Paranoia  Delusions Paranoid  Perception WDL  Hallucination None reported or observed  Judgment Poor  Confusion None  Danger to Self  Current suicidal ideation? Denies  Danger to Others  Danger to Others None reported or observed
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
## 2021-05-12 NOTE — Progress Notes (Signed)
@   82 Phone conversation with guardian: talked to pt.s guardian (daughter Lonia Skinner) she signed consents for medication and treatment and the consents are in the chart. Shia wanted to know if there was some long acting medicine that her mom could take when she gets out because she knows her mom is non-compliant. Brynda Greathouse stated that her mom calls her and is saying that she knows someone is coming in and out of the house. She says her mom will say anything to get discharged. Please call Shia @ [phone] to discuss

## 2021-05-13 LAB — GLUCOSE, CAPILLARY: Glucose-Capillary: 90 mg/dL (ref 70–99)

## 2021-05-13 MED ORDER — METOPROLOL SUCCINATE 12.5 MG HALF TABLET
12.5000 mg | ORAL_TABLET | Freq: Every day | ORAL | Status: DC
  Administered 2021-05-13 – 2021-05-15 (×3): 12.5 mg via ORAL
  Filled 2021-05-13 (×4): qty 1

## 2021-05-13 MED ORDER — OLANZAPINE 10 MG PO TBDP
20.0000 mg | ORAL_TABLET | Freq: Every day | ORAL | Status: DC
  Administered 2021-05-13: 20 mg via ORAL
  Filled 2021-05-13 (×3): qty 2

## 2021-05-13 MED ORDER — DIVALPROEX SODIUM 250 MG PO DR TAB
750.0000 mg | DELAYED_RELEASE_TABLET | Freq: Every day | ORAL | Status: DC
  Administered 2021-05-13: 750 mg via ORAL
  Filled 2021-05-13 (×3): qty 3

## 2021-05-13 MED ORDER — DIVALPROEX SODIUM 500 MG PO DR TAB
500.0000 mg | DELAYED_RELEASE_TABLET | Freq: Every day | ORAL | Status: DC
  Administered 2021-05-14: 500 mg via ORAL
  Filled 2021-05-13 (×3): qty 1
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
## 2021-05-13 NOTE — Progress Notes (Signed)
Hannelore requested to speak with a chaplain.  She wanted to pray for the world around her and the pain that she experiences when she sees the news.  I provided listening presence and prayer and brought her a Bible, at her request.  Kathrynn Humble, Bcc Pager, [phone] 10:39 AM

## 2021-05-13 NOTE — Progress Notes (Signed)
Progress note  Pt found in bed; compliant with medication administration. Continues to be pleasant but does seem to be responding to internal stimuli at times when approached. Pt has been seen in the milieu but has failed to interact with others. Pt denies si/hi/ah/vh and verbally agrees to approach staff if these become apparent or before harming themselves/others while at [facility].  A: Pt provided support and encouragement. Pt given medication per protocol and standing orders. Q10m safety checks implemented and continued.  R: Pt safe on the unit. Will continue to monitor.

## 2021-05-13 NOTE — Progress Notes (Signed)
Adult Psychoeducational Group Note  Date:  05/13/2021 Time:  8:41 PM  Group Topic/Focus:  Wrap-Up Group:   The focus of this group is to help patients review their daily goal of treatment and discuss progress on daily workbooks.  Participation Level:  Active  Participation Quality:  Appropriate  Affect:  Angry  Cognitive:  Oriented  Insight: Limited  Engagement in Group:  Engaged  Modes of Intervention:  Education  Additional Comments:  Patient attended and participated in group tonight. She reports that one thing she like s about herself is that she can get along with other people even if she do not agree with what they are saying  Debe Coder 05/13/2021, 8:41 PM

## 2021-05-13 NOTE — [facility] Group Notes (Signed)
Type of Therapy and Topic:  Group Therapy:  Positive Affirmations   Participation Level:  Active   Description of Group: This group addressed positive affirmation toward self and others. Patients went around the room and identified two positive things about themselves and two positive things about a peer in the room. Patients reflected on how it felt to share something positive with others, to identify positive things about themselves, and to hear positive things from others. Patients were encouraged to have a daily reflection of positive characteristics or circumstances. Therapeutic Goals Patient will verbalize two of their positive qualities Patient will demonstrate empathy for others by stating two positive qualities about a peer in the group Patient will verbalize their feelings when voicing positive self affirmations and when voicing positive affirmations of others Patients will discuss the potential positive impact on their wellness/recovery of focusing on positive traits of self and others.   Summary of Patient Progress: Due to the acuity and complex discharge plans, group was not held. Patient was provided therapeutic worksheets and asked to meet with CSW as needed. 

## 2021-05-13 NOTE — Progress Notes (Signed)
Recreation Therapy Notes  Date: 7.13.22 Time: 1000 Location: [address] Dayroom  Group Topic: Communication  Goal Area(s) Addresses:  Patient will effectively communicate with peers in group.  Patient will verbalize benefit of healthy communication. Patient will verbalize positive effect of healthy communication on post d/c goals.   Behavioral Response: Engaged, Attentive  Intervention: Geometrical Drawings, Pencils, Blank Paper  Activity: LRT and patients talked about the various ways (cell phone, texting, e-mail, body language, eye contact, etc) in which people communicate with each other. Patients also talked about how communication can affect different areas of our lives.  Three volunteers would come to the front of the room one at a time.  Each person was given a picture to describe to the rest of the group.  The presenters were to be as descriptive as possible and the listeners could only ask for the presenter to repeat themselves.  They could not ask any descriptive questions.    Education: Communication, Discharge Planning  Education Outcome: Acknowledges understanding/In group clarification offered/Needs additional education.   Clinical Observations/Feedback: Pt named body language as a form of communication.  Pt presented one of the drawings to the group.  Pt lacked description for the picture pt presented to the group.  Pt was able to complete the task.  Pt talked about it being difficult to communicate with the people she lives with.  Pt also expressed needing to learn was to deal with individuals who push her "buttons" to get a reaction.  Pt emphasized needing to coping skills when dealing with people.  Pt went on to agree with people can be more animated when talking and that that can make some people uncomfortable when that's not the intention.       Victorino Sparrow, LRT/CTRS     Victorino Sparrow A 05/13/2021 1:09 PM

## 2021-05-13 NOTE — Plan of Care (Signed)
  Problem: Activity: Goal: Interest or engagement in activities will improve Outcome: Progressing   Problem: Coping: Goal: Ability to verbalize frustrations and anger appropriately will improve Outcome: Progressing   Problem: Health Behavior/Discharge Planning: Goal: Identification of resources available to assist in meeting health care needs will improve Outcome: Progressing

## 2021-05-14 LAB — GLUCOSE, CAPILLARY: Glucose-Capillary: 95 mg/dL (ref 70–99)

## 2021-05-14 MED ORDER — OLANZAPINE 5 MG PO TBDP
25.0000 mg | ORAL_TABLET | Freq: Every day | ORAL | Status: DC
  Administered 2021-05-14 – 2021-05-15 (×2): 25 mg via ORAL
  Filled 2021-05-14 (×3): qty 1

## 2021-05-14 MED ORDER — DIVALPROEX SODIUM ER 500 MG PO TB24
1000.0000 mg | ORAL_TABLET | Freq: Every day | ORAL | Status: DC
  Administered 2021-05-14 – 2021-05-17 (×4): 1000 mg via ORAL
  Filled 2021-05-14 (×7): qty 2

## 2021-05-14 MED ORDER — TRAZODONE HCL 100 MG PO TABS
100.0000 mg | ORAL_TABLET | Freq: Every evening | ORAL | Status: DC | PRN
  Administered 2021-05-14: 100 mg via ORAL
  Filled 2021-05-14: qty 1

## 2021-05-14 NOTE — Progress Notes (Signed)
Pt requests medication for feelings of agitation, given PRN PO Ativan and Haldol, see MAR for more details.

## 2021-05-14 NOTE — Progress Notes (Signed)
Pt visible on the unit, pt stated she was doing better. Pt appeared to be responding to internal stimuli less than yesterday. Pt given PRN Milk of Magnesia and Trazodone per MAR with HS medication    05/14/21 2000  Psych Admission Type (Psych Patients Only)  Admission Status Involuntary  Psychosocial Assessment  Patient Complaints Anxiety  Eye Contact Fair  Facial Expression Anxious  Affect Anxious  Speech Logical/coherent  Interaction Assertive  Motor Activity Slow  Appearance/Hygiene Unremarkable  Behavior Characteristics Cooperative  Mood Anxious  Thought Process  Coherency Concrete thinking  Content Blaming others  Delusions None reported or observed  Perception WDL  Hallucination None reported or observed  Judgment WDL  Confusion None  Danger to Self  Current suicidal ideation? Denies  Danger to Others  Danger to Others None reported or observed

## 2021-05-14 NOTE — Progress Notes (Signed)
Recreation Therapy Notes  Date: 7.14.22 Time: 1000 Location: 500 Hall Dayroom  Group Topic: Self-Esteem  Goal Area(s) Addresses:  Patient will successfully identify positive attributes about themselves.  Patient will successfully identify benefit of improved self-esteem.   Behavioral Response: Engaged  Intervention: Blank Face Sheet, Markers  Activity: How I See Me.  Patients were to choose a blank outline of a face that represents them.  Patients were to design an image highlighting the positive things about themselves.  Patients could use words to go along with the art to express their feelings.  Patients and LRT discussed the drawings at during discussion.  Education:  Self-Esteem, Discharge Planning  Education Outcome: Acknowledges education/In group clarification offered/Needs additional education  Clinical Observations/Feedback: Pt stated her self esteem comes from "the [employer]".  Pt did have moments were she would ramble about things not pertaining to group.  For example, pt was talking about people needing to go work somewhere else if they aren't happy with what they do.  Pt was able to focus long enough to complete the activity.  Pt explained being born in [place] but being raised in [place] by grandparents.  Pt identified some of her positive characteristics as responsible, reliable, determined, worthy of own wealth, smart, educated, hard working and time savvy.       Victorino Sparrow, LRT/CTRS        Victorino Sparrow A 05/14/2021 11:27 AM

## 2021-05-14 NOTE — Progress Notes (Signed)
Pt continues to respond to internal stimuli on the unit by talking to people not seen by staff. Pt given PRN Trazodone per MAR with HS medication    05/14/21 0000  Psych Admission Type (Psych Patients Only)  Admission Status Involuntary  Psychosocial Assessment  Patient Complaints Anxiety  Eye Contact Fair  Facial Expression Anxious  Affect Anxious  Speech Logical/coherent  Interaction Assertive  Motor Activity Slow  Appearance/Hygiene Unremarkable  Behavior Characteristics Cooperative  Mood Anxious;Pleasant;Preoccupied  Thought Process  Coherency Concrete thinking  Content Blaming others  Delusions None reported or observed  Perception WDL  Hallucination None reported or observed  Judgment Poor  Confusion None  Danger to Self  Current suicidal ideation? Denies  Danger to Others  Danger to Others None reported or observed

## 2021-05-14 NOTE — Progress Notes (Signed)
Pt had someone bring her Hydrocortisone cream it was locked up in her locker, pt informed to tell the doctor what she needed it for so he could order it for her, pt not forthcoming at this time.
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
## 2021-05-14 NOTE — Progress Notes (Signed)
Pt did not attend orientation/goals group. 

## 2021-05-14 NOTE — Progress Notes (Signed)
Pt has been alert and oriented to person, place, but not to time or situation. Pt has been spending time in the dayroom quietly, does not initiate interaction with peers or staff, is compliant with unit routines, denies suicidal and homicidal ideation, denies hallucinations. Will continue to monitor pt per Q15 minute face checks and monitor for safety.

## 2021-05-14 NOTE — [facility] Group Notes (Signed)
Occupational Therapy Group Note Date: 05/14/2021 Group Topic/Focus: Health and Wellness  Group Description: Group encouraged increased focus and engagement through topic focused on self-care and group members completed a self-care assessment that identified specific categories within self-care that needed improvement, including physical, emotional/psychological, social, spiritual, and professional. Discussion focused on identifying which areas need the most work/improvement and brainstormed strategies and tips to improve self-care. Group members were also encouraged to identify strengths and areas of improvement.    Participation Level: Active   Participation Quality: Independent   Behavior: Calm and Cooperative   Speech/Thought Process: Disorganized   Affect/Mood: Euthymic   Insight: Fair   Judgement: Fair   Individualization: Anita Hill was active in their participation of group discussion/activity, however presented as disorganized at times in her responses. Pt identified one self-care activity she currently engages in "pumice my feet in the shower". Pt also identified areas of improvement for self care including "getting 30 minutes of exercise, listening to God's word, and prayer."  Modes of Intervention: Activity, Discussion, and Education  Patient Response to Interventions:  Attentive   Plan: Continue to engage patient in OT groups 2 - 3x/week.  05/14/2021  Ponciano Ort, MOT, OTR/L

## 2021-05-14 NOTE — Progress Notes (Signed)
Adult Psychoeducational Group Note  Date:  05/14/2021 Time:  8:37 PM  Group Topic/Focus:  Wrap-Up Group:   The focus of this group is to help patients review their daily goal of treatment and discuss progress on daily workbooks.  Participation Level:  Active  Participation Quality:  Appropriate  Affect:  Appropriate  Cognitive:  Appropriate  Insight: Appropriate  Engagement in Group:  Engaged  Modes of Intervention:  Discussion  Additional Comments:  patient said her day was a 83. The most positive thing that happened to day she went to 3 groups today.  Lenice Llamas Long 05/14/2021, 8:37 PM

## 2021-05-15 LAB — GLUCOSE, CAPILLARY: Glucose-Capillary: 112 mg/dL — ABNORMAL HIGH (ref 70–99)

## 2021-05-15 MED ORDER — METOPROLOL SUCCINATE ER 25 MG PO TB24
25.0000 mg | ORAL_TABLET | Freq: Every day | ORAL | Status: DC
  Administered 2021-05-16 – 2021-05-18 (×3): 25 mg via ORAL
  Filled 2021-05-15 (×5): qty 1

## 2021-05-15 MED ORDER — HALOPERIDOL DECANOATE 100 MG/ML IM SOLN
100.0000 mg | INTRAMUSCULAR | Status: DC
  Administered 2021-05-15: 100 mg via INTRAMUSCULAR
  Filled 2021-05-15: qty 1

## 2021-05-15 MED ORDER — TRAZODONE HCL 150 MG PO TABS: 150.0000 mg | ORAL_TABLET | Freq: Every evening | ORAL | Status: DC | PRN

## 2021-05-15 MED ORDER — DOXEPIN HCL 75 MG PO CAPS
75.0000 mg | ORAL_CAPSULE | Freq: Every day | ORAL | Status: DC
  Administered 2021-05-15: 75 mg via ORAL
  Filled 2021-05-15 (×2): qty 1
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
## 2021-05-15 NOTE — [facility] Group Notes (Signed)
CSW was unable to complete group due to not having adequate staffing.    Nateisha Moyd MSW, LCSW Clincal Social Worker  [facility]  

## 2021-05-15 NOTE — Progress Notes (Signed)
Pt continues to respond to internal stimuli, pt kept to herself much of the evening, pt given HS medications without incident. Pt continues to be paranoid on the unit    05/15/21 2000  Psych Admission Type (Psych Patients Only)  Admission Status Involuntary  Psychosocial Assessment  Patient Complaints Anxiety  Eye Contact Intense;Staring  Facial Expression Anxious  Affect Appropriate to circumstance  Speech Logical/coherent  Interaction Assertive  Motor Activity Slow  Appearance/Hygiene Unremarkable  Behavior Characteristics Cooperative  Mood Labile  Thought Process  Coherency Tangential;Flight of ideas  Content Blaming others  Delusions Paranoid  Perception WDL  Hallucination None reported or observed  Judgment Limited  Confusion None  Danger to Self  Current suicidal ideation? Denies  Danger to Others  Danger to Others None reported or observed

## 2021-05-15 NOTE — Progress Notes (Signed)
Pt did not attend orientation/goals group. 

## 2021-05-15 NOTE — [facility] Suicide Risk Assessment (Signed)
[place] INPATIENT:  Family/Significant Other Suicide Prevention Education  Suicide Prevention Education:  Education Completed;  daughter/LG - Anita Hill [phone]),  has been identified by the patient as the family member/significant other with whom the patient will be residing, and identified as the person(s) who will aid the patient in the event of Anita mental health crisis (suicidal ideations/suicide attempt).  With written consent from the patient, the family member/significant other has been provided the following suicide prevention education, prior to the and/or following the discharge of the patient.   CSW spoke with pt's sister who states that she will be picking this pt up when discharged.   Pt's daughter states she would like this pt to be referred for group home placements and states she will follow up with placement after pt is discharged.   The suicide prevention education provided includes the following: Suicide risk factors Suicide prevention and interventions National Suicide Hotline telephone number [facility] Anita assessment telephone number [facility] Emergency Assistance [place] and/or Residential Mobile Crisis Unit telephone number  Request made of family/significant other to: Remove weapons (e.g., guns, rifles, knives), all items previously/currently identified as safety concern.   Remove drugs/medications (over-the-counter, prescriptions, illicit drugs), all items previously/currently identified as Anita safety concern.  The family member/significant other verbalizes understanding of the suicide prevention education information provided.  The family member/significant other agrees to remove the items of safety concern listed above.  Anita Hill Anita Hill 05/15/2021, 2:37 PM

## 2021-05-15 NOTE — Progress Notes (Addendum)
Pt came to the nursing station stating" I'm having a reaction from the shot I received earlier" pt stated she was having N/V and Diarrhea . Per NP-Shalon PRN  50 mg Benadryl IM ok to give for her reaction and documented in the [facility]. Pt educated on the reactions to haldol and encouraged to let staff know if she gets any other Sx. Will continue to monitor. Pt does not appear to be in any acute distress at the moment.     05/15/21 2242  Vital Signs  Pulse Rate 78  BP 135/89  BP Location Left Arm  BP Method Automatic  Patient Position (if appropriate) Sitting
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
## 2021-05-15 NOTE — Tx Team (Signed)
Interdisciplinary Treatment and Diagnostic Plan Update  05/15/2021 Time of Session: 11:30am Anita Hill MRN: [id number]  Principal Diagnosis: Schizophrenia spectrum disorder with psychotic disorder type not yet determined [facility])  Secondary Diagnoses: Principal Problem:   Schizophrenia spectrum disorder with psychotic disorder type not yet determined ([place])   Current Medications:  Current Facility-Administered Medications  Medication Dose Route Frequency Provider Last Rate Last Admin   acetaminophen (TYLENOL) tablet 650 mg  650 mg Oral Q6H PRN Ajibola, Ene A, NP   650 mg at 05/12/21 0823   alum & mag hydroxide-simeth (MAALOX/MYLANTA) 200-200-20 MG/5ML suspension 30 mL  30 mL Oral Q4H PRN Ajibola, Ene A, NP       benztropine (COGENTIN) tablet 0.5 mg  0.5 mg Oral Q6H PRN Arthor Captain, MD       diphenhydrAMINE (BENADRYL) capsule 25 mg  25 mg Oral Q6H PRN Nelda Marseille, Amy E, MD   25 mg at 05/15/21 1028   Or   diphenhydrAMINE (BENADRYL) injection 50 mg  50 mg Intramuscular Q6H PRN Nelda Marseille, Amy E, MD       divalproex (DEPAKOTE ER) 24 hr tablet 1,000 mg  1,000 mg Oral Q2200 Sharma Covert, MD   1,000 mg at 05/14/21 2106   doxepin (SINEQUAN) capsule 75 mg  75 mg Oral QHS Sharma Covert, MD       haloperidol (HALDOL) tablet 5 mg  5 mg Oral Q8H PRN Nelda Marseille, Amy E, MD   5 mg at 05/15/21 1029   Or   haloperidol lactate (HALDOL) injection 5 mg  5 mg Intramuscular Q8H PRN Nelda Marseille, Amy E, MD       haloperidol decanoate (HALDOL DECANOATE) 100 MG/ML injection 100 mg  100 mg Intramuscular Q30 days Sharma Covert, MD   100 mg at 05/15/21 1215   LORazepam (ATIVAN) injection 1 mg  1 mg Intramuscular Q6H PRN Nelda Marseille, Amy E, MD       magnesium hydroxide (MILK OF MAGNESIA) suspension 30 mL  30 mL Oral Daily PRN Ajibola, Ene A, NP   30 mL at 05/14/21 1934   [START ON 05/16/2021] metoprolol succinate (TOPROL-XL) 24 hr tablet 25 mg  25 mg Oral Daily Sharma Covert, MD       multivitamin  with minerals tablet 1 tablet  1 tablet Oral Daily Ajibola, Ene A, NP   1 tablet at 05/15/21 3335   nicotine polacrilex (NICORETTE) gum 2 mg  2 mg Oral PRN Viann Fish E, MD   2 mg at 05/15/21 1029   OLANZapine zydis (ZYPREXA) disintegrating tablet 25 mg  25 mg Oral QHS Sharma Covert, MD   25 mg at 05/14/21 2105   thiamine tablet 100 mg  100 mg Oral Daily Ajibola, Ene A, NP   100 mg at 05/15/21 0808   PTA Medications: Medications Prior to Admission  Medication Sig Dispense Refill Last Dose   cyclobenzaprine (FLEXERIL) 10 MG tablet Take 1 tablet (10 mg total) by mouth 2 (two) times daily as needed for muscle spasms. (Patient not taking: Reported on 05/09/2021) 20 tablet 0    hydrocortisone cream 1 % Apply to affected area 2 times daily 14.2 g 0    ibuprofen (ADVIL) 600 MG tablet Take 1 tablet (600 mg total) by mouth every 6 (six) hours as needed. (Patient not taking: Reported on 05/09/2021) 30 tablet 0    lidocaine (LIDODERM) 5 % Place 1 patch onto the skin daily. Remove & Discard patch within 12 hours or as directed by  MD (Patient not taking: Reported on 05/09/2021) 30 patch 0    naproxen (NAPROSYN) 500 MG tablet Take 1 tablet (500 mg total) by mouth 2 (two) times daily. (Patient not taking: Reported on 05/09/2021) 30 tablet 0    predniSONE (STERAPRED UNI-PAK 21 TAB) 10 MG (21) TBPK tablet Take by mouth daily. Take 6 tabs by mouth daily  for 2 days, then 5 tabs for 2 days, then 4 tabs for 2 days, then 3 tabs for 2 days, 2 tabs for 2 days, then 1 tab by mouth daily for 2 days (Patient not taking: Reported on 05/09/2021) 42 tablet 0    terbinafine (LAMISIL) 250 MG tablet Take 1 tablet (250 mg total) by mouth daily. 90 tablet 0     Patient Stressors: Marital or family conflict Medication change or noncompliance Substance abuse  Patient Strengths: Active sense of humor Average or above average intelligence Communication skills Physical Health Supportive family/friends  Treatment Modalities:  Medication Management, Group therapy, Case management,  1 to 1 session with clinician, Psychoeducation, Recreational therapy.   Physician Treatment Plan for Primary Diagnosis: Schizophrenia spectrum disorder with psychotic disorder type not yet determined ([place]) Long Term Goal(s): Improvement in symptoms so as ready for discharge   Short Term Goals: Ability to identify changes in lifestyle to reduce recurrence of condition will improve Ability to disclose and discuss suicidal ideas Ability to maintain clinical measurements within normal limits will improve Compliance with prescribed medications will improve Ability to identify triggers associated with substance abuse/mental health issues will improve  Medication Management: Evaluate patient's response, side effects, and tolerance of medication regimen.  Therapeutic Interventions: 1 to 1 sessions, Unit Group sessions and Medication administration.  Evaluation of Outcomes: Not progressing  Physician Treatment Plan for Secondary Diagnosis: Principal Problem:   Schizophrenia spectrum disorder with psychotic disorder type not yet determined ([place])  Long Term Goal(s): Improvement in symptoms so as ready for discharge   Short Term Goals: Ability to identify changes in lifestyle to reduce recurrence of condition will improve Ability to disclose and discuss suicidal ideas Ability to maintain clinical measurements within normal limits will improve Compliance with prescribed medications will improve Ability to identify triggers associated with substance abuse/mental health issues will improve     Medication Management: Evaluate patient's response, side effects, and tolerance of medication regimen.  Therapeutic Interventions: 1 to 1 sessions, Unit Group sessions and Medication administration.  Evaluation of Outcomes: Progressing   RN Treatment Plan for Primary Diagnosis: Schizophrenia spectrum disorder with psychotic disorder type not yet  determined ([place]) Long Term Goal(s): Knowledge of disease and therapeutic regimen to maintain health will improve  Short Term Goals: Ability to verbalize frustration and anger appropriately will improve, Ability to demonstrate self-control, Ability to participate in decision making will improve, Ability to verbalize feelings will improve, Ability to identify and develop effective coping behaviors will improve, and Compliance with prescribed medications will improve  Medication Management: RN will administer medications as ordered by provider, will assess and evaluate patient's response and provide education to patient for prescribed medication. RN will report any adverse and/or side effects to prescribing provider.  Therapeutic Interventions: 1 on 1 counseling sessions, Psychoeducation, Medication administration, Evaluate responses to treatment, Monitor vital signs and CBGs as ordered, Perform/monitor CIWA, COWS, AIMS and Fall Risk screenings as ordered, Perform wound care treatments as ordered.  Evaluation of Outcomes: Progressing   LCSW Treatment Plan for Primary Diagnosis: Schizophrenia spectrum disorder with psychotic disorder type not yet determined ([place]) Long Term Goal(s):  Safe transition to appropriate next level of care at discharge, Engage patient in therapeutic group addressing interpersonal concerns.  Short Term Goals: Engage patient in aftercare planning with referrals and resources, Increase ability to appropriately verbalize feelings, Increase emotional regulation, Facilitate acceptance of mental health diagnosis and concerns, Facilitate patient progression through stages of change regarding substance use diagnoses and concerns, Identify triggers associated with mental health/substance abuse issues, and Increase skills for wellness and recovery  Therapeutic Interventions: Assess for all discharge needs, 1 to 1 time with Social worker, Explore available resources and support systems,  Assess for adequacy in community support network, Educate family and significant other(s) on suicide prevention, Complete Psychosocial Assessment, Interpersonal group therapy.  Evaluation of Outcomes: Progressing   Progress in Treatment: Attending groups: Yes. Participating in groups: Yes. Taking medication as prescribed: Yes. Toleration medication: Yes. Family/Significant other contact made: No, will contact:  patient guardian Patient understands diagnosis: No. Discussing patient identified problems/goals with staff: No. Medical problems stabilized or resolved: Yes. Denies suicidal/homicidal ideation: Yes. Issues/concerns per patient self-inventory: No.   New problem(s) identified: No, Describe:  N/A  New Short Term/Long Term Goal(s):   medication stabilization, elimination of SI thoughts, development of comprehensive mental wellness plan.    Patient Goals:  Patient states, "I want to go home"  Discharge Plan or Barriers: Patient is to return to home and is to follow up with [facility] for medication management and therapy.  Reason for Continuation of Hospitalization: Delusions  Hallucinations Medication stabilization  Estimated Length of Stay: 3-5 days  Attendees: Patient:  05/15/2021 1:33 PM  Physician: Lestine Mount, DO 05/15/2021 1:33 PM  Nursing:  05/15/2021 1:33 PM  RN Care Manager: 05/15/2021 1:33 PM  Social Worker: Darletta Moll, [place] 05/15/2021 1:33 PM  Recreational Therapist:  05/15/2021 1:33 PM  Other:  05/15/2021 1:33 PM  Other:  05/15/2021 1:33 PM  Other: 05/15/2021 1:33 PM    Scribe for Treatment Team: Vassie Moselle, LCSW 05/15/2021 2:23 PM

## 2021-05-15 NOTE — Progress Notes (Signed)
Recreation Therapy Notes  Date: 7.15.22 Time: 1000 Location: [address] Dayroom  Group Topic: Communication, Team Building, Problem Solving  Goal Area(s) Addresses:  Patient will effectively work with peer towards shared goal.  Patient will identify skills used to make activity successful.  Patient will identify how skills used during activity can be applied to reach post d/c goals.   Intervention: STEM Activity- Metallurgist  Activity: Tallest [employer]. In teams of 3-5, patients were given 25 small craft pipe cleaners. Using the materials provided, patients were instructed to compete against the opposing team(s) to build the tallest free-standing structure from floor level. The activity was timed; difficulty increased by Probation officer as Pharmacist, hospital continued.  Systematically resources were removed with additional directions for example, placing one arm behind their back, working in silence, and shape stipulations. LRT facilitated post-activity discussion reviewing team processes and necessary communication skills involved in completion. Patients were encouraged to reflect how the skills utilized, or not utilized, in this activity can be incorporated to positively impact support systems post discharge.  Education: Education officer, community, Environmental health practitioner, Discharge Planning   Education Outcome: Acknowledges education/In group clarification offered/Needs additional education.   Clinical Observations/Feedback: Pt did not attend group session.     Victorino Sparrow, LRT/CTRS         Victorino Sparrow A 05/15/2021 11:36 AM

## 2021-05-15 NOTE — [facility] Group Notes (Signed)
SPIRITUALITY GROUP NOTE   Spirituality group facilitated by Kathrynn Humble, [place].   Group Description: Group focused on topic of hope. Patients participated in facilitated discussion around topic, connecting with one another around experiences and definitions for hope. Group members engaged with visual explorer photos, reflecting on what hope looks like for them today. Group engaged in discussion around how their definitions of hope are present today in hospital.   Modalities: Psycho-social ed, Adlerian, Narrative, MI   Patient Progress: Anita Hill attended group and was engaged in conversation.  Verbal comments were on topic and she was an attentive listener to others in the group.  [place], Bcc Pager, [phone] 3:16 PM

## 2021-05-16 LAB — COMPREHENSIVE METABOLIC PANEL
ALT: 16 U/L (ref 0–44)
AST: 23 U/L (ref 15–41)
Albumin: 4.1 g/dL (ref 3.5–5.0)
Alkaline Phosphatase: 58 U/L (ref 38–126)
Anion gap: 11 (ref 5–15)
BUN: 22 mg/dL — ABNORMAL HIGH (ref 6–20)
CO2: 27 mmol/L (ref 22–32)
Calcium: 9.3 mg/dL (ref 8.9–10.3)
Chloride: 102 mmol/L (ref 98–111)
Creatinine, Ser: 0.8 mg/dL (ref 0.44–1.00)
GFR, Estimated: >60 mL/min (ref >60)
Glucose, Bld: 111 mg/dL — ABNORMAL HIGH (ref 70–99)
Potassium: 3.9 mmol/L (ref 3.5–5.1)
Sodium: 140 mmol/L (ref 135–145)
Total Bilirubin: 0.6 mg/dL (ref 0.3–1.2)
Total Protein: 6.9 g/dL (ref 6.5–8.1)

## 2021-05-16 LAB — CBC WITH DIFFERENTIAL/PLATELET
Abs Immature Granulocytes: 0.02 10*3/uL (ref 0.00–0.07)
Basophils Absolute: 0 10*3/uL (ref 0.0–0.1)
Basophils Relative: 0 %
Eosinophils Absolute: 0 10*3/uL (ref 0.0–0.5)
Eosinophils Relative: 0 %
HCT: 43.8 % (ref 36.0–46.0)
Hemoglobin: 15.2 g/dL — ABNORMAL HIGH (ref 12.0–15.0)
Immature Granulocytes: 0 %
Lymphocytes Relative: 10 %
Lymphs Abs: 0.6 10*3/uL — ABNORMAL LOW (ref 0.7–4.0)
MCH: 29.9 pg (ref 26.0–34.0)
MCHC: 34.7 g/dL (ref 30.0–36.0)
MCV: 86.2 fL (ref 80.0–100.0)
Monocytes Absolute: 0.5 10*3/uL (ref 0.1–1.0)
Monocytes Relative: 8 %
Neutro Abs: 5.2 10*3/uL (ref 1.7–7.7)
Neutrophils Relative %: 82 %
Platelets: 255 10*3/uL (ref 150–400)
RBC: 5.08 MIL/uL (ref 3.87–5.11)
RDW: 14.7 % (ref 11.5–15.5)
WBC: 6.4 10*3/uL (ref 4.0–10.5)
nRBC: 0 % (ref 0.0–0.2)

## 2021-05-16 LAB — URINALYSIS, COMPLETE (UACMP) WITH MICROSCOPIC
Bacteria, UA: NONE SEEN
Bilirubin Urine: NEGATIVE
Glucose, UA: NEGATIVE mg/dL
Ketones, ur: NEGATIVE mg/dL
Leukocytes,Ua: NEGATIVE
Nitrite: NEGATIVE
Protein, ur: NEGATIVE mg/dL
Specific Gravity, Urine: 1.016 (ref 1.005–1.030)
pH: 5 (ref 5.0–8.0)

## 2021-05-16 LAB — VALPROIC ACID LEVEL: Valproic Acid Lvl: 50 ug/mL (ref 50.0–100.0)

## 2021-05-16 LAB — GLUCOSE, CAPILLARY: Glucose-Capillary: 123 mg/dL — ABNORMAL HIGH (ref 70–99)

## 2021-05-16 MED ORDER — LOPERAMIDE HCL 2 MG PO CAPS
2.0000 mg | ORAL_CAPSULE | Freq: Four times a day (QID) | ORAL | Status: DC | PRN
  Administered 2021-05-16: 2 mg via ORAL
  Filled 2021-05-16: qty 1

## 2021-05-16 MED ORDER — ONDANSETRON 4 MG PO TBDP
4.0000 mg | ORAL_TABLET | Freq: Four times a day (QID) | ORAL | Status: DC | PRN
  Administered 2021-05-16: 4 mg via ORAL
  Filled 2021-05-16: qty 1

## 2021-05-16 MED ORDER — DOXEPIN HCL 100 MG PO CAPS
100.0000 mg | ORAL_CAPSULE | Freq: Every day | ORAL | Status: DC
  Administered 2021-05-16 – 2021-05-17 (×2): 100 mg via ORAL
  Filled 2021-05-16 (×4): qty 1

## 2021-05-16 MED ORDER — OLANZAPINE 10 MG PO TBDP
20.0000 mg | ORAL_TABLET | Freq: Every day | ORAL | Status: DC
  Administered 2021-05-16: 20 mg via ORAL
  Filled 2021-05-16 (×2): qty 2

## 2021-05-16 NOTE — Progress Notes (Signed)
Adult Psychoeducational Group Note  Date:  05/16/2021 Time:  10:42 PM  Group Topic/Focus:  Wrap-Up Group:   The focus of this group is to help patients review their daily goal of treatment and discuss progress on daily workbooks.  Participation Level:  Did Not Attend  Participation Quality:   Did Not Attend  Affect:   Did Not Attend  Cognitive:   Did Not Attend  Insight: None  Engagement in Group:   Did Not Attend  Modes of Intervention:   Did Not Attend  Additional Comments:  Pt did not attend evening wrap up group tonight.  Candy Sledge 05/16/2021, 10:42 PM

## 2021-05-16 NOTE — [facility] Group Notes (Signed)
  [facility] LCSW Group Therapy Note  Date/Time:  05/16/2021 11:15AM-12:00PM  Type of Therapy and Topic:  Group Therapy:  Feelings About Hospitalization  Participation Level:  Did Not Attend   Description of Group This process group involved patients discussing their feelings related to being hospitalized, as well as the benefits they see to being in the hospital.  These feelings and benefits were itemized.  The group then brainstormed specific ways in which they could seek those same benefits when they discharge and return home.  Therapeutic Goals Patient will identify and describe positive and negative feelings related to hospitalization Patient will verbalize benefits of hospitalization to themselves personally Patients will brainstorm together ways they can obtain similar benefits in the outpatient setting, identify barriers to wellness and possible solutions  Summary of Patient Progress:  The patient was invited to group, did not attend.  Therapeutic Modalities Cognitive Behavioral Therapy Motivational Interviewing    Selmer Dominion, LCSW 05/16/2021, 9:41 AM

## 2021-05-16 NOTE — Progress Notes (Signed)
Pt complaining of N/V and pt vitals with T-100.8. per NP-Shalon ok to give pt Tylenol and Zofran per [facility]. Will continue to monitor    05/16/21 0616  Vital Signs  Temp (!) 100.8 F (38.2 C)  Temp Source Oral  Pulse Rate (!) 104  BP 98/72  BP Location Right Arm  BP Method Automatic  Patient Position (if appropriate) Sitting  Oxygen Therapy  SpO2 92 %
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
## 2021-05-17 MED ORDER — OLANZAPINE 10 MG PO TBDP
30.0000 mg | ORAL_TABLET | Freq: Every day | ORAL | Status: DC
  Administered 2021-05-17: 30 mg via ORAL
  Filled 2021-05-17 (×3): qty 3

## 2021-05-17 NOTE — Progress Notes (Signed)
   05/16/21 2100  Psych Admission Type (Psych Patients Only)  Admission Status Involuntary  Psychosocial Assessment  Patient Complaints None  Eye Contact Intense  Facial Expression Flat  Affect Appropriate to circumstance  Speech Logical/coherent  Interaction Assertive  Motor Activity Slow  Appearance/Hygiene Unremarkable  Behavior Characteristics Appropriate to situation;Cooperative  Mood Preoccupied  Thought Process  Coherency Tangential;Flight of ideas  Content Blaming others  Delusions Paranoid  Perception WDL  Hallucination None reported or observed  Judgment Limited  Confusion None  Danger to Self  Current suicidal ideation? Denies  Danger to Others  Danger to Others None reported or observed

## 2021-05-17 NOTE — Progress Notes (Signed)
Adult Psychoeducational Group Note  Date:  05/17/2021 Time:  8:43 PM  Group Topic/Focus:  Wrap-Up Group:   The focus of this group is to help patients review their daily goal of treatment and discuss progress on daily workbooks.  Participation Level:  Minimal  Participation Quality:  Appropriate  Affect:  Appropriate  Cognitive:  Appropriate  Insight: Appropriate  Engagement in Group:  Limited  Modes of Intervention:  Discussion  Additional Comments:  Pt stated her goal for today was to focus on her treatment plan. Pt stated she accomplished her goal today. Pt stated she talked with her doctor and her social worker about her care today. Pt rated her overall day a 10. Pt stated she was able to contact her aunt and daughter today which improved her overall day. Pt stated she felt better about herself tonight. . Pt stated she took all medications provided today. Pt stated her appetite was poor today. Pt rated her sleep last night was pretty good. Pt stated the goal tonight was to get some rest. Pt stated she had no physical pain today. Pt deny visual hallucinations and auditory issues tonight. today.  Pt denies thoughts of harming herself or others. Pt stated she would alert staff if anything changed.  Candy Sledge 05/17/2021, 8:43 PM

## 2021-05-17 NOTE — [facility] Group Notes (Signed)
[facility] Group Notes: (Clinical Social Work)   05/17/2021      Type of Therapy:  Group Therapy   Participation Level:  Did Not Attend - was invited both individually by M[facility] and by overhead announcement, chose not to attend.   Selmer Dominion, LCSW 05/17/2021, 12:06 PM

## 2021-05-17 NOTE — [facility] Group Notes (Signed)
Adult Psychoeducational Group Note  Date:  05/17/2021 Time:  9:17 AM  Group Topic/Focus:  Goals Group:   The focus of this group is to help patients establish daily goals to achieve during treatment and discuss how the patient can incorporate goal setting into their daily lives to aide in recovery.  Participation Level:  Active  Participation Quality:  Attentive  Affect:  Appropriate  Cognitive:  Alert  Insight: Good  Engagement in Group:  Engaged  Modes of Intervention:  Discussion  Additional Comments  Dalene Carrow 05/17/2021, 9:17 AM

## 2021-05-17 NOTE — Plan of Care (Signed)
  Problem: Safety: Goal: Periods of time without injury will increase Outcome: Progressing   Problem: Education: Goal: Will be free of psychotic symptoms Outcome: Progressing Goal: Knowledge of the prescribed therapeutic regimen will improve Outcome: Progressing
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
## 2021-05-18 MED ORDER — HALOPERIDOL DECANOATE 100 MG/ML IM SOLN: 100.0000 mg | INTRAMUSCULAR | 0 refills | Status: DC

## 2021-05-18 MED ORDER — OLANZAPINE 15 MG PO TBDP: 30.0000 mg | ORAL_TABLET | Freq: Every day | ORAL | 0 refills | Status: DC

## 2021-05-18 MED ORDER — LORAZEPAM 1 MG PO TABS
1.0000 mg | ORAL_TABLET | Freq: Once | ORAL | Status: AC
  Administered 2021-05-18: 1 mg via ORAL
  Filled 2021-05-18: qty 1

## 2021-05-18 MED ORDER — METOPROLOL SUCCINATE ER 25 MG PO TB24: 25.0000 mg | ORAL_TABLET | Freq: Every day | ORAL | 0 refills | Status: DC

## 2021-05-18 MED ORDER — DOXEPIN HCL 100 MG PO CAPS: 100.0000 mg | ORAL_CAPSULE | Freq: Every day | ORAL | 0 refills | Status: DC

## 2021-05-18 MED ORDER — BENZTROPINE MESYLATE 0.5 MG PO TABS: 0.5000 mg | ORAL_TABLET | Freq: Four times a day (QID) | ORAL | 0 refills | Status: DC | PRN

## 2021-05-18 MED ORDER — DIVALPROEX SODIUM ER 500 MG PO TB24: 1000.0000 mg | ORAL_TABLET | Freq: Every day | ORAL | 0 refills | Status: DC

## 2021-05-18 NOTE — Progress Notes (Signed)
Anita Hill was up and visible on the unit.  She was pleasant and cooperative.  She attended evening wrap up group.  She took her hs medications without difficulty.  She denied SI/HI or AVH, however she was noted talking to herself and punching at something in her room.  She became agitated and anxious about the disruptive milieu.  She was noted pacing her room, coming in and out of her room and restless.  PRN for agitated was ineffective.  Talked with NP and one time order of lorazepam ordered with good relief.  Q 15 minute checks maintained for safety.  We will continue to monitor the progress towards her goals.    05/18/21 0120  Psych Admission Type (Psych Patients Only)  Admission Status Involuntary  Psychosocial Assessment  Patient Complaints Agitation;Suspiciousness  Eye Contact Fair  Facial Expression Flat  Affect Anxious;Irritable  Speech Logical/coherent  Interaction Assertive  Motor Activity Other (Comment) (unremarkable)  Appearance/Hygiene Unremarkable  Behavior Characteristics Cooperative;Agitated;Anxious;Irritable  Mood Suspicious;Irritable  Thought Nutritional therapist others;Confabulation;Paranoia  Delusions Paranoid  Perception Hallucinations  Hallucination Tactile;Auditory (She appears to be responding to internal stimuli)  Judgment Limited  Confusion None  Danger to Self  Current suicidal ideation? Denies  Danger to Others  Danger to Others None reported or observed
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
## 2021-05-18 NOTE — Progress Notes (Signed)
  [facility] Adult Case Management Discharge Plan :  Will you be returning to the same living situation after discharge:  Yes,  will be staying with daughter At discharge, do you have transportation home?: Yes,  daughter is to pick this pt up Do you have the ability to pay for your medications: Yes,  has insurance   Release of information consent forms completed and in the chart;  Patient's signature needed at discharge.  Patient to Follow up at:  [place]. Go to.   Specialty: Behavioral Health Why: Please go to this provider for therapy and medication management services, during walk in hours:  Monday through Wednesday, from 8:00 am to 11:00 am.  Services are provided on a first come, first served basis. Contact information: [place] [phone]                Next level of care provider has access to [place] and Suicide Prevention discussed: Yes,  with daughter     Has patient been referred to the Quitline?: Patient refused referral  Patient has been referred for addiction treatment: Pt. refused referral  Vassie Moselle, LCSW 05/18/2021, 10:11 AM

## 2021-05-18 NOTE — Plan of Care (Signed)
  Problem: Education: Goal: Emotional status will improve Outcome: Progressing Goal: Mental status will improve Outcome: Progressing   Problem: Activity: Goal: Sleeping patterns will improve Outcome: Progressing

## 2021-05-18 NOTE — Progress Notes (Signed)
Recreation Therapy Notes  Date: 7.18.22 Time: 1010 Location: [address] Dayroom  Group Topic: Self-Esteem  Goal Area(s) Addresses:  Patient will successfully identify positive attributes about themselves.  Patient will successfully identify benefit of improved self-esteem.   Intervention: Markers, Colored Pencils, Glue Sticks, [employer], Corporate investment banker, Music, Magazines  Activity: Collage About Me.  Patients were to create a collage about who they are using the materials provided.  Patients were to highlight places they want to go, things they are proud of, anything that's important to them that has helped to form the person they have become.  Education:  Self-Esteem, Discharge Planning  Education Outcome: Acknowledges education/In group clarification offered/Needs additional education  Clinical Observations/Feedback: Pt did not attend group session.      Victorino Sparrow, LRT/CTRS        Ria Comment, [place] A 05/18/2021 1:12 PM

## 2021-05-18 NOTE — BHH Suicide Risk Assessment (Signed)
[facility] Discharge Suicide Risk Assessment   Principal Problem: Schizophrenia spectrum disorder with psychotic disorder type not yet determined [facility]) Discharge Diagnoses: Principal Problem:   Schizophrenia spectrum disorder with psychotic disorder type not yet determined ([place])   Total Time spent with patient: 20 minutes  Musculoskeletal: Strength & Muscle Tone: within normal limits Gait & Station: normal Patient leans: N/A  Psychiatric Specialty Exam  Presentation  General Appearance: Appropriate for Environment  Eye Contact:Fair  Speech:Clear and Coherent  Speech Volume:Normal  Handedness:Right   Mood and Affect  Mood:Irritable; Dysphoric; Labile  Duration of Depression Symptoms: No data recorded Affect:Congruent; Labile   Thought Process  Thought Processes:Disorganized  Descriptions of Associations:Loose  Orientation:Full (Time, Place and Person)  Thought Content:Paranoid Ideation; Delusions; Illogical; Scattered  History of Schizophrenia/Schizoaffective disorder:No  Duration of Psychotic Symptoms:No data recorded Hallucinations:No data recorded Ideas of Reference:Delusions; Paranoia  Suicidal Thoughts:No data recorded Homicidal Thoughts:No data recorded  Sensorium  Memory:Immediate Fair; Recent Fair; Remote Fair  Judgment:Impaired  Insight:Lacking   Executive Functions  Concentration:Fair  Attention Span:Fair  [place]   Psychomotor Activity  Psychomotor Activity: No data recorded  Assets  Assets:Communication Skills; Desire for Improvement; Resilience; Social Support   Sleep  Sleep: No data recorded  Physical Exam: Physical Exam Vitals and nursing note reviewed.  Constitutional:      Appearance: Normal appearance.  HENT:     Head: Normocephalic and atraumatic.  Pulmonary:     Effort: Pulmonary effort is normal.  Neurological:     General: No focal deficit present.     Mental  Status: She is alert and oriented to person, place, and time.   Review of Systems  All other systems reviewed and are negative. Blood pressure 115/84, pulse 81, temperature 98.4 F (36.9 C), temperature source Oral, resp. rate 16, height 5\' 2"  (1.575 m), weight 68 kg, SpO2 100 %. Body mass index is 27.42 kg/m.  Mental Status Per Nursing Assessment::   On Admission:  NA  Demographic Factors:  Divorced or widowed, Low socioeconomic status, and Unemployed  Loss Factors: Financial problems/change in socioeconomic status  Historical Factors: Impulsivity  Risk Reduction Factors:   Living with another person, especially a relative and Positive social support  Continued Clinical Symptoms:  Bipolar Disorder:   [place]ixed [place] Alcohol/Substance Abuse/Dependencies  Cognitive Features That Contribute To Risk:  Thought constriction (tunnel vision)    Suicide Risk:  Minimal: No identifiable suicidal ideation.  Patients presenting with no risk factors but with morbid ruminations; may be classified as minimal risk based on the severity of the depressive symptoms   Follow-up [place]. Go to.   Specialty: Behavioral Health Why: Please go to this provider for therapy and medication management services, during walk in hours:  Monday through Wednesday, from 8:00 am to 11:00 am.  Services are provided on a first come, first served basis. Contact information: [place] [phone]                Plan Of Care/Follow-up recommendations:  Activity:  ad lib  Sharma Covert, MD 05/18/2021, 9:30 AM

## 2021-05-18 NOTE — Final Progress Note (Addendum)
Discharge Note:  Patient denies SI/HI AVH at this time. Discharge instructions, AVS, prescriptions and transition record gone over with patient and guardian. Patient agrees to comply with medication management, follow-up visit, and outpatient therapy. Patient belongings returned to patient. Patient questions and concerns addressed and answered.  Patient ambulatory off unit.  Patient discharged to home with daughter.

## 2021-05-25 DIAGNOSIS — F203 Undifferentiated schizophrenia: Secondary | ICD-10-CM | POA: Diagnosis not present

## 2021-06-08 ENCOUNTER — Ambulatory Visit: Payer: Self-pay | Admitting: Family Medicine

## 2021-06-09 ENCOUNTER — Ambulatory Visit: Payer: Self-pay | Admitting: Family Medicine

## 2021-06-12 ENCOUNTER — Ambulatory Visit (INDEPENDENT_AMBULATORY_CARE_PROVIDER_SITE_OTHER): Payer: Medicaid Other | Admitting: Family Medicine

## 2021-06-12 ENCOUNTER — Encounter: Payer: Self-pay | Admitting: Family Medicine

## 2021-06-12 ENCOUNTER — Other Ambulatory Visit: Payer: Self-pay

## 2021-06-12 DIAGNOSIS — M25562 Pain in left knee: Secondary | ICD-10-CM | POA: Diagnosis not present

## 2021-06-12 DIAGNOSIS — G8929 Other chronic pain: Secondary | ICD-10-CM

## 2021-06-12 MED ORDER — MELOXICAM 15 MG PO TABS: 7.5000 mg | ORAL_TABLET | Freq: Every day | ORAL | 6 refills | Status: DC | PRN

## 2021-06-12 NOTE — Progress Notes (Signed)
Office Visit Note   Patient: Anita Hill           Date of Birth: 24-Nov-1970           MRN: [id number] Visit Date: 06/12/2021 Requested by: No referring provider defined for this encounter. PCP: Patient, No Pcp Per (Inactive)  Subjective: Chief Complaint  Patient presents with   Left Knee - Pain    Pain in the knee and up her thigh since June. NKI. Wakes her from her sleep. Having to stand with more weight on the right leg, due to the pain. Went to ED on 6/20 because the pain was so severe -- had knee and hip xrays.    HPI: She is here for left knee pain.  She has a history of tibial plateau fracture in 2017.  About 3 or 4 months ago she began having pain in the knee.  She was fine until then, but there was no injury to cause the recent onset of pain.  Pain became worse and June and she went to the ER where x-rays were obtained showing some arthritic changes in the joint.  I reviewed those this morning.  She has been using Tylenol for pain and that gives her a little bit of relief.  The pain is fairly constant, and her knee has been swollen and making a lot of popping noises.                ROS: She does note that she drinks alcohol on a daily basis and is planning to quit.  All other systems were reviewed and are negative.  Objective: Vital Signs: LMP  (LMP Unknown)   Physical Exam:  General:  Alert and oriented, in no acute distress. Pulm:  Breathing unlabored. Psy:  Normal mood, congruent affect. Skin: No erythema Left knee: She has 1+ effusion with no warmth.  She has 2+ patellofemoral crepitus.  There is palpable popping with active range of motion of her knee.  No detectable ligamentous laxity.  There is moderate tenderness on the medial and lateral joint lines.    Imaging: No results found.  Assessment & Plan: Chronic left knee pain and popping, possible meniscus tear versus loose body. -We discussed options, she wants to try meloxicam.  She will be very cautious  with long-term use because of her alcohol intake.  If symptoms are not improved over the next couple weeks, would consider aspiration and injection.  If that does not help, then MRI scan.     Procedures: No procedures performed        PMFS History: Patient Active Problem List   Diagnosis Date Noted   Delusions ([place]) 05/10/2021   Chest pain 01/20/2016   Dyspnea 10/13/2012   Tobacco abuse 07/06/2012   Palpitations    Schizophrenia spectrum disorder with psychotic disorder type not yet determined ([place])    Congestive heart failure ([place])    BIPOLAR DISORDER 12/29/2006   BACK PAIN, LOW 12/29/2006   Past Medical History:  Diagnosis Date   BIPOLAR DISORDER    Delusions ([place])    Homelessness    Left ventricular hypertrophy    Palpitations    Psychosis ([place])    Sickle cell trait ([place])    Substance abuse ([place])     Family History  Problem Relation Age of Onset   Heart disease Mother        CHF   Prostate cancer Father     Past Surgical History:  Procedure Laterality Date  BUNIONECTOMY     Social History   Occupational History    Comment: Unemployed  Tobacco Use   Smoking status: Every Day    Packs/day: 1.00    Years: 10.00    Pack years: 10.00    Types: Cigarettes, Cigars   Smokeless tobacco: Never  Vaping Use   Vaping Use: Never used  Substance and Sexual Activity   Alcohol use: Yes    Comment: everyday 20-80oz   Drug use: No    Comment: Previous cocaine, heroin, marijuana   Sexual activity: Not Currently

## 2021-06-19 ENCOUNTER — Ambulatory Visit: Payer: Self-pay | Admitting: Family Medicine

## 2021-07-01 ENCOUNTER — Emergency Department (HOSPITAL_COMMUNITY)
Admission: EM | Admit: 2021-07-01 | Discharge: 2021-07-02 | Disposition: A | Discharge status: Home / Self Care | Payer: Medicaid Other | Attending: Emergency Medicine | Admitting: Emergency Medicine

## 2021-07-01 ENCOUNTER — Encounter (HOSPITAL_COMMUNITY): Payer: Self-pay | Admitting: Emergency Medicine

## 2021-07-01 DIAGNOSIS — Z046 Encounter for general psychiatric examination, requested by authority: Secondary | ICD-10-CM

## 2021-07-01 DIAGNOSIS — Z79899 Other long term (current) drug therapy: Secondary | ICD-10-CM | POA: Diagnosis not present

## 2021-07-01 DIAGNOSIS — F209 Schizophrenia, unspecified: Secondary | ICD-10-CM | POA: Insufficient documentation

## 2021-07-01 DIAGNOSIS — R258 Other abnormal involuntary movements: Secondary | ICD-10-CM | POA: Diagnosis not present

## 2021-07-01 DIAGNOSIS — U071 COVID-19: Secondary | ICD-10-CM | POA: Diagnosis not present

## 2021-07-01 DIAGNOSIS — Z20822 Contact with and (suspected) exposure to covid-19: Secondary | ICD-10-CM | POA: Diagnosis not present

## 2021-07-01 DIAGNOSIS — F17210 Nicotine dependence, cigarettes, uncomplicated: Secondary | ICD-10-CM | POA: Insufficient documentation

## 2021-07-01 DIAGNOSIS — I509 Heart failure, unspecified: Secondary | ICD-10-CM | POA: Insufficient documentation

## 2021-07-01 DIAGNOSIS — Y900 Blood alcohol level of less than 20 mg/100 ml: Secondary | ICD-10-CM | POA: Diagnosis not present

## 2021-07-01 LAB — CBC WITH DIFFERENTIAL/PLATELET
Abs Immature Granulocytes: 0.01 10*3/uL (ref 0.00–0.07)
Basophils Absolute: 0 10*3/uL (ref 0.0–0.1)
Basophils Relative: 0 %
Eosinophils Absolute: 0.1 10*3/uL (ref 0.0–0.5)
Eosinophils Relative: 3 %
HCT: 39.8 % (ref 36.0–46.0)
Hemoglobin: 13.7 g/dL (ref 12.0–15.0)
Immature Granulocytes: 0 %
Lymphocytes Relative: 40 %
Lymphs Abs: 1.5 10*3/uL (ref 0.7–4.0)
MCH: 29.7 pg (ref 26.0–34.0)
MCHC: 34.4 g/dL (ref 30.0–36.0)
MCV: 86.3 fL (ref 80.0–100.0)
Monocytes Absolute: 0.4 10*3/uL (ref 0.1–1.0)
Monocytes Relative: 10 %
Neutro Abs: 1.7 10*3/uL (ref 1.7–7.7)
Neutrophils Relative %: 47 %
Platelets: 300 10*3/uL (ref 150–400)
RBC: 4.61 MIL/uL (ref 3.87–5.11)
RDW: 13.9 % (ref 11.5–15.5)
WBC: 3.7 10*3/uL — ABNORMAL LOW (ref 4.0–10.5)
nRBC: 0 % (ref 0.0–0.2)

## 2021-07-01 LAB — COMPREHENSIVE METABOLIC PANEL
ALT: 13 U/L (ref 0–44)
AST: 17 U/L (ref 15–41)
Albumin: 4.2 g/dL (ref 3.5–5.0)
Alkaline Phosphatase: 66 U/L (ref 38–126)
Anion gap: 8 (ref 5–15)
BUN: 22 mg/dL — ABNORMAL HIGH (ref 6–20)
CO2: 26 mmol/L (ref 22–32)
Calcium: 9.8 mg/dL (ref 8.9–10.3)
Chloride: 104 mmol/L (ref 98–111)
Creatinine, Ser: 0.89 mg/dL (ref 0.44–1.00)
GFR, Estimated: >60 mL/min (ref >60)
Glucose, Bld: 134 mg/dL — ABNORMAL HIGH (ref 70–99)
Potassium: 3.5 mmol/L (ref 3.5–5.1)
Sodium: 138 mmol/L (ref 135–145)
Total Bilirubin: 0.7 mg/dL (ref 0.3–1.2)
Total Protein: 7.3 g/dL (ref 6.5–8.1)

## 2021-07-01 LAB — PREGNANCY, URINE: Preg Test, Ur: NEGATIVE

## 2021-07-01 LAB — ETHANOL: Alcohol, Ethyl (B): <10 mg/dL (ref <10)

## 2021-07-01 LAB — RAPID URINE DRUG SCREEN, HOSP PERFORMED
Amphetamines: NOT DETECTED
Barbiturates: NOT DETECTED
Benzodiazepines: NOT DETECTED
Cocaine: NOT DETECTED
Opiates: NOT DETECTED
Tetrahydrocannabinol: NOT DETECTED

## 2021-07-01 LAB — RESP PANEL BY RT-PCR (FLU A&B, COVID) ARPGX2
Influenza A by PCR: NEGATIVE
Influenza B by PCR: NEGATIVE
SARS Coronavirus 2 by RT PCR: POSITIVE — AB
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
## 2021-07-01 NOTE — ED Provider Notes (Signed)
[name] Provider Note   CSN: [id number] Arrival date & time: 07/01/21  1017     History Chief Complaint  Patient presents with   IVC    Anita Hill is a 50 y.o. female.  Patient presents ER by EMS.  She states that her daughter was awarded conservatorship over her and she was declared incompetent by the legal court.  She states she lives with her daughter and has been having some interactions with her daughter.  She feels her daughter is jealous of her and accusing her falsely and wants to control her.  Patient otherwise denies any pain.  No headache no chest pain no abdominal pain.  Denies any thoughts of self-harm.  Denies any auditory visual hallucinations.  After verbal altercation she states her daughter placed her on an IVC hold and sent into the ER.      Past Medical History:  Diagnosis Date   BIPOLAR DISORDER    Delusions ([place])    Homelessness    Left ventricular hypertrophy    Palpitations    Psychosis ([place])    Sickle cell trait ([place])    Substance abuse ([place])     Patient Active Problem List   Diagnosis Date Noted   Delusions ([place]) 05/10/2021   Chest pain 01/20/2016   Dyspnea 10/13/2012   Tobacco abuse 07/06/2012   Palpitations    Schizophrenia spectrum disorder with psychotic disorder type not yet determined ([place])    Congestive heart failure ([place])    BIPOLAR DISORDER 12/29/2006   BACK PAIN, LOW 12/29/2006    Past Surgical History:  Procedure Laterality Date   BUNIONECTOMY       OB History     Gravida  1   Para      Term      Preterm      AB      Living         SAB      IAB      Ectopic      Multiple      Live Births              Family History  Problem Relation Age of Onset   Heart disease Mother        CHF   Prostate cancer Father     Social History   Tobacco Use   Smoking status: Every Day    Packs/day: 1.00    Years: 10.00    Pack years: 10.00    Types: Cigarettes,  Cigars   Smokeless tobacco: Never  Vaping Use   Vaping Use: Never used  Substance Use Topics   Alcohol use: Yes    Comment: everyday 20-80oz   Drug use: No    Comment: Previous cocaine, heroin, marijuana    Home Medications Prior to Admission medications   Medication Sig Start Date End Date Taking? Authorizing Provider  benztropine (COGENTIN) 0.5 MG tablet Take 1 tablet (0.5 mg total) by mouth every 6 (six) hours as needed for tremors (muscle stiffness). 05/18/21   Sharma Covert, MD  divalproex (DEPAKOTE ER) 500 MG 24 hr tablet Take 2 tablets (1,000 mg total) by mouth daily at 10 pm. 05/18/21   Sharma Covert, MD  doxepin (SINEQUAN) 100 MG capsule Take 1 capsule (100 mg total) by mouth at bedtime. 05/18/21   Sharma Covert, MD  haloperidol decanoate (HALDOL DECANOATE) 100 MG/ML injection Inject 1 mL (100 mg total) into the muscle every 30 (  thirty) days. 06/14/21   Sharma Covert, MD  meloxicam (MOBIC) 15 MG tablet Take 0.5-1 tablets (7.5-15 mg total) by mouth daily as needed for pain. 06/12/21   Hilts, Legrand Como, MD  metoprolol succinate (TOPROL-XL) 25 MG 24 hr tablet Take 1 tablet (25 mg total) by mouth daily. 05/18/21   Sharma Covert, MD  OLANZapine zydis (ZYPREXA) 15 MG disintegrating tablet Take 2 tablets (30 mg total) by mouth at bedtime. 05/18/21   Sharma Covert, MD  potassium chloride SA (K-DUR,KLOR-CON) 20 MEQ tablet Take 1 tablet (20 mEq total) by mouth 2 (two) times daily. Patient not taking: Reported on 12/11/2018 01/20/16 05/23/19  Lelon Perla, MD    Allergies    Strawberry extract, Risperidone and related, and Latuda [lurasidone hcl]  Review of Systems   Review of Systems  Constitutional:  Negative for fever.  HENT:  Negative for ear pain.   Eyes:  Negative for pain.  Respiratory:  Negative for cough.   Cardiovascular:  Negative for chest pain.  Gastrointestinal:  Negative for abdominal pain.  Genitourinary:  Negative for flank pain.   Musculoskeletal:  Negative for back pain.  Skin:  Negative for rash.  Neurological:  Negative for headaches.   Physical Exam Updated Vital Signs BP 114/85   Pulse 80   Temp 97.9 F (36.6 C) (Oral)   Resp 16   SpO2 99%   Physical Exam Constitutional:      General: She is not in acute distress.    Appearance: Normal appearance.  HENT:     Head: Normocephalic.     Nose: Nose normal.  Eyes:     Extraocular Movements: Extraocular movements intact.  Cardiovascular:     Rate and Rhythm: Normal rate.  Pulmonary:     Effort: Pulmonary effort is normal.  Musculoskeletal:        General: Normal range of motion.     Cervical back: Normal range of motion.  Neurological:     General: No focal deficit present.     Mental Status: She is alert. Mental status is at baseline.  Psychiatric:     Comments: On my evaluation patient appears appropriate.  She is appropriate mentation and appropriate thought process.  Is cooperative and does not appear tangential or manic.    ED Results / Procedures / Treatments   Labs (all labs ordered are listed, but only abnormal results are displayed) Labs Reviewed  COMPREHENSIVE METABOLIC PANEL - Abnormal; Notable for the following components:      Result Value   Glucose, Bld 134 (*)    BUN 22 (*)    All other components within normal limits  CBC WITH DIFFERENTIAL/PLATELET - Abnormal; Notable for the following components:   WBC 3.7 (*)    All other components within normal limits  RESP PANEL BY RT-PCR (FLU A&B, COVID) ARPGX2  ETHANOL  RAPID URINE DRUG SCREEN, HOSP PERFORMED  PREGNANCY, URINE    EKG None  Radiology No results found.  Procedures Procedures   Medications Ordered in ED Medications - No data to display  ED Course  I have reviewed the triage vital signs and the nursing notes.  Pertinent labs & imaging results that were available during my care of the patient were reviewed by me and considered in my medical decision making  (see chart for details).    MDM Rules/Calculators/A&P  Labs unremarkable white count normal chemistry normal.  Patient medically cleared and pending TTS evaluation.  Final Clinical Impression(s) / ED Diagnoses Final diagnoses:  Involuntary commitment    Rx / DC Orders ED Discharge Orders     None        Luna Fuse, MD 07/01/21 1424
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
## 2021-07-01 NOTE — BH Assessment (Signed)
[facility] Assessment Progress Note   Pt presents under IVC, but needs First Exam by 07/02/2021 at 10:21 am.  EDP Lavenia Atlas and TTS staff have been notified.  Jalene Mullet, [place] Coordinator [phone]

## 2021-07-02 NOTE — ED Notes (Signed)
Patient's daughter called back regarding her mother. Patient's daughter stated that she was at work and saw that the police were at her house and saw them through the video. Patient's daughter is requesting that someone call her back.   A message was left with Case Manager, Deatra Robinson also gave the return number for the patient's daughter.

## 2021-07-02 NOTE — Progress Notes (Addendum)
CSW attempted to call pt's Daughter  Anita Hill [phone], no answer unable to leave VM , mailbox not set up yet . Will try again.    0000000 CSW contact police to do [place] on pt's daughters home, as she is the pt's LG and can get in contact with .  Anita Hill.Anita Hill, MSW, [place]  Transitions of Care Clinical Social Worker I Direct Dial: [phone]  Fax: [phone] Anita Hill.Anita Hill

## 2021-07-02 NOTE — ED Provider Notes (Signed)
Cleared by psych.   Lennice Sites, DO 07/02/21 1111

## 2021-07-02 NOTE — BH Assessment (Addendum)
[facility] Assessment Progress Note   Per [name], NP, this pt does not require psychiatric hospitalization at this time.  Pt presents under IVC initiated by pt's daughter/legal guardian and upheld by EDP Lennice Sites, DO, which has been rescinded by Hampton Abbot, MD.  Pt is psychiatrically cleared.  Behavioral health referrals are not indicated for this pt at this time.  A TOC consult has been ordered to facilitate pt's return to the community.  Dr Ronnald Nian, charge nurse Marzetta Board, and Lu Duffel, LCSW, have been notified.  Jalene Mullet, MA Triage Specialist [phone]   Addendum:  After further consideration, it has been determined that pt would benefit from referral information for area behavioral health providers.  Contacts for [facility] and for area ACT Team providers has been included in pt's discharge instructions.  Parties above have been notified.  Jalene Mullet, [employer] Coordinator [phone]

## 2021-07-02 NOTE — ED Notes (Signed)
Patient given meal tray.

## 2021-07-02 NOTE — Progress Notes (Signed)
CSW spoke with Pt's Daughter Lana Fish Wilkins([phone]) she stated she is at work and does not get off until 2pm. She expressed her frustration with pt being psych cleared and ready for d/c. Per pt's daughter pt's medications does not work for her, also pt only has two days left of current medication. Pt's daughter stated pt does not have a psychiatrist and is requesting behavioral health resources. Pt's daughter will pick pt up around 2pm.   Arlie Solomons.Khalfani Weideman, MSW, [place]  Transitions of Care Clinical Social Worker I Direct Dial: [phone]  Fax: [phone] [email or web address]

## 2021-07-02 NOTE — ED Notes (Signed)
Spoke with patient's daughter and states she was getting off of work and would come shortly to pick the patient up.

## 2021-07-02 NOTE — Discharge Instructions (Signed)
For your behavioral health needs you are advised to follow up with [facility] at your earliest opportunity:      [facility]      [place], [place] [address]      [phone]      They offer psychiatry/medication management, therapy and substance use disorder treatment.  New patients are seen in their walk-in clinic.  Walk-in hours are Monday - Thursday from 8:00 am - 11:00 am for psychiatry, and Friday from 1:00 pm - 4:00 pm for therapy.  Walk-in patients are seen on a first come, first served basis, so try to arrive as early as possible for the best chance of being seen the same day.  Please note that to be eligible for services you must bring an ID or a piece of mail with your name and a [facility] address.  You may be eligible for ACT Team services, which would include more frequent visits with your provider, as well as in-home services.  The following providers offer ACT Team services.  Contact them at your earliest opportunity to ask about enrolling in their program:       [facility]      [address] [place], [place] [address]      [phone]       [facility]      [address]      [place], [place] [address]      [phone]       [facility] ACT [facility]      [facility], [address]      [address]      [place], [place]  [address]      [phone]       [facility]      [address]      [place], [place] [address]      [phone]

## 2021-07-02 NOTE — ED Notes (Signed)
Patient's daughter notified by phone that the patient has been discharged and [facility], EDP notified that the patient's daughter did not want her discharged. Patient's daughter hung up the phone on Probation officer.

## 2021-07-07 ENCOUNTER — Ambulatory Visit (INDEPENDENT_AMBULATORY_CARE_PROVIDER_SITE_OTHER): Payer: Medicaid Other | Admitting: Family Medicine

## 2021-07-07 ENCOUNTER — Other Ambulatory Visit: Payer: Self-pay

## 2021-07-07 ENCOUNTER — Encounter: Payer: Self-pay | Admitting: Family Medicine

## 2021-07-07 VITALS — BP 140/98 | HR 76 | Ht 62.0 in | Wt 147.6 lb

## 2021-07-07 DIAGNOSIS — F29 Unspecified psychosis not due to a substance or known physiological condition: Secondary | ICD-10-CM

## 2021-07-07 DIAGNOSIS — Z1211 Encounter for screening for malignant neoplasm of colon: Secondary | ICD-10-CM | POA: Diagnosis not present

## 2021-07-07 MED ORDER — DOXEPIN HCL 100 MG PO CAPS: 100.0000 mg | ORAL_CAPSULE | Freq: Every day | ORAL | 0 refills | Status: DC

## 2021-07-07 MED ORDER — HALOPERIDOL DECANOATE 100 MG/ML IM SOLN: 100.0000 mg | INTRAMUSCULAR | 0 refills | Status: DC

## 2021-07-07 MED ORDER — OLANZAPINE 15 MG PO TBDP: 30.0000 mg | ORAL_TABLET | Freq: Every day | ORAL | 0 refills | Status: DC

## 2021-07-07 MED ORDER — BENZTROPINE MESYLATE 0.5 MG PO TABS: 0.5000 mg | ORAL_TABLET | Freq: Four times a day (QID) | ORAL | 0 refills | Status: DC | PRN

## 2021-07-07 MED ORDER — METOPROLOL SUCCINATE ER 25 MG PO TB24: 25.0000 mg | ORAL_TABLET | Freq: Every day | ORAL | 0 refills | Status: DC

## 2021-07-07 NOTE — Patient Instructions (Signed)
I have sent in a month of all of your medications. I have placed a referral to social work for a Tourist information centre manager to help you with the assisted living facility. I have also placed a referral to GI for the colonoscopy. For the mammogram you can call and make an appointment as able.

## 2021-07-07 NOTE — Progress Notes (Signed)
New Patient Office Visit  Subjective:  Patient ID: Anita Hill, female    DOB: Jul 17, 1971  Age: 50 y.o. MRN: [id number]  CC:  Chief Complaint  Patient presents with   Establish Care   Nail Problem    HPI Anita Hill presents for establishing care. Patient has a complex mental health history and has been hospitalized several times.  She is currently living with her daughter and there has been some concerning behaviors that patient was on is willing to discuss.  Daughter is concerned with leaving her at home alone as she drinks a lot and has issues with the daughter's dog and is concerned that the patient may hurt the dog or something in the house while she is away.  She is working on getting established with psychiatry and therapy for her mental problems but the daughter takes her to appointments and has limited days off, they are currently needing a refill on all of the psychiatric medication.  Patient would also like to have her cancer screenings and get her medical health together.   Past Medical History:  Diagnosis Date   BIPOLAR DISORDER    Delusions ([place])    Homelessness    Left ventricular hypertrophy    Palpitations    Psychosis (HCC)    Sickle cell trait (HCC)    Substance abuse ([place])     Past Surgical History:  Procedure Laterality Date   BUNIONECTOMY      Family History  Problem Relation Age of Onset   Heart disease Mother        CHF   Prostate cancer Father     Social History   Socioeconomic History   Marital status: Single    Spouse name: Not on file   Number of children: 2   Years of education: Not on file   Highest education level: Not on file  Occupational History    Comment: Unemployed  Tobacco Use   Smoking status: Every Day    Packs/day: 1.00    Years: 10.00    Pack years: 10.00    Types: Cigarettes, Cigars   Smokeless tobacco: Never  Vaping Use   Vaping Use: Never used  Substance and Sexual Activity   Alcohol use: Yes     Comment: everyday 20-80oz   Drug use: No    Comment: Previous cocaine, heroin, marijuana   Sexual activity: Not Currently  Other Topics Concern   Not on file  Social History Narrative   Not on file   Social Determinants of Health   Financial Resource Strain: Not on file  Food Insecurity: Not on file  Transportation Needs: Not on file  Physical Activity: Not on file  Stress: Not on file  Social Connections: Not on file  Intimate Partner Violence: Not on file    ROS Review of Systems  Constitutional:  Negative for fatigue and fever.  HENT:  Negative for congestion.   Eyes:  Negative for visual disturbance.  Respiratory:  Negative for cough, chest tightness, shortness of breath and wheezing.   Cardiovascular:  Negative for chest pain, palpitations and leg swelling.  Gastrointestinal:  Negative for abdominal pain, constipation, diarrhea, nausea and vomiting.  Neurological:  Negative for dizziness, weakness and light-headedness.  Psychiatric/Behavioral:  Positive for behavioral problems.    Objective:   Today's Vitals: BP (!) 140/98   Pulse 76   Ht '5\' 2"'$  (1.575 m)   Wt 147 lb 9.6 oz (67 kg)   SpO2 100%  BMI 27.00 kg/m   Gen: well-appearing, NAD CV: RRR, no m/r/g appreciated, no peripheral edema Pulm: CTAB, no wheezes/crackles GI: soft, non-tender, non-distended  Assessment & Plan:   Referral to community care coordination for social worker to assist with moving into an assisted living facility for mental health (patient daughter prefers the [facility]).  No other acute concerns at this time.  Patient is to follow-up with psychiatry for further refills on medications and psych medication management.  Patient to follow-up in the next 2 to 3 weeks to complete a more thorough physical as it was limited due to time constraints.   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   [DISCONTINUED] doxepin (SINEQUAN) 100 MG capsule Take 1 capsule (100 mg total) by mouth at bedtime.   [DISCONTINUED] haloperidol decanoate (HALDOL DECANOATE) 100 MG/ML injection Inject 1 mL (100 mg total) into the muscle every 30 (thirty) days.   [DISCONTINUED] metoprolol succinate (TOPROL-XL) 25 MG 24 hr tablet Take 1 tablet (25 mg total) by mouth daily.   [DISCONTINUED] OLANZapine zydis (ZYPREXA) 15 MG disintegrating tablet Take 2 tablets (30 mg total) by mouth at bedtime.   benztropine (COGENTIN) 0.5 MG tablet Take 1 tablet (0.5 mg total) by mouth every 6 (six) hours as needed for tremors (muscle stiffness).   divalproex (DEPAKOTE ER) 500 MG 24 hr tablet Take 2 tablets (1,000 mg total) by mouth daily at 10 pm. (Patient not taking: Reported on 07/07/2021)   doxepin (SINEQUAN) 100 MG capsule Take 1 capsule (100 mg total) by mouth at bedtime.   haloperidol decanoate (HALDOL DECANOATE) 100 MG/ML injection Inject 1 mL (100 mg total) into the muscle every 30 (thirty) days.   meloxicam (MOBIC) 15 MG tablet Take 0.5-1 tablets (7.5-15 mg total) by mouth daily as needed for pain. (Patient not taking: Reported on 07/07/2021)   metoprolol succinate (TOPROL-XL) 25 MG 24 hr tablet Take 1 tablet (25 mg total) by mouth daily.   olanzapine zydis (ZYPREXA) 15 MG disintegrating tablet Take 2 tablets (30 mg total) by mouth at bedtime.   [DISCONTINUED] potassium chloride SA (K-DUR,KLOR-CON) 20 MEQ tablet Take 1 tablet (20 mEq total) by mouth 2 (two) times daily. (Patient not taking: Reported on 12/11/2018)    No facility-administered encounter medications on file as of 07/07/2021.    Follow-up: Return in about 2 weeks (around 07/21/2021) for Physical.   Waymond Meador, DO

## 2021-07-08 ENCOUNTER — Telehealth: Payer: Self-pay | Admitting: *Deleted

## 2021-07-08 NOTE — Chronic Care Management (AMB) (Signed)
  Care Management   Note  07/08/2021 Name: Anita Hill MRN: [id number] DOB: Feb 04, 1971  Anita Hill is a 50 y.o. year old female who is a primary care patient of Lilland, Alana, DO. I reached out to [name] by phone today in response to a referral sent by Anita Hill PCP, Rise Patience, DO.  Anita Hill was given information about care management services today including:  Care management services include personalized support from designated clinical staff supervised by her physician, including individualized plan of care and coordination with other care providers 24/7 contact phone numbers for assistance for urgent and routine care needs. The patient may stop care management services at any time by phone call to the office staff.  Daughter Legal guardian Anita Hill verbally agreed to assistance and services provided by embedded care coordination/care management team today.  Follow up plan: Telephone appointment with care management team member scheduled for:07/15/21  [facility] Management  Direct Dial: [phone]

## 2021-07-15 ENCOUNTER — Ambulatory Visit: Payer: Medicaid Other | Admitting: Licensed Clinical Social Worker

## 2021-07-15 DIAGNOSIS — F319 Bipolar disorder, unspecified: Secondary | ICD-10-CM

## 2021-07-15 DIAGNOSIS — F29 Unspecified psychosis not due to a substance or known physiological condition: Secondary | ICD-10-CM

## 2021-07-15 DIAGNOSIS — Z7189 Other specified counseling: Secondary | ICD-10-CM

## 2021-07-15 NOTE — Chronic Care Management (AMB) (Signed)
Care Management Clinical Social Work Note  07/15/2021 Name: SONTEE ROUNDTREE MRN: [id number] DOB: 09-Aug-1971  ERIANE APPIAH is a 50 y.o. year old female who is a primary care patient of Lilland, Alana, DO.  The Care Management team was consulted for assistance with chronic disease management and coordination needs. [employer] , Mental Health Counseling and Resources, and Caregiver Stress  Engaged with patient's daughter by telephone for initial visit in response to provider referral for social work chronic care management and care coordination services  Consent to Services:  Ms. Kovalsky was given information about Care Management services today including:  Care Management services includes personalized support from designated clinical staff supervised by her physician, including individualized plan of care and coordination with other care providers 24/7 contact phone numbers for assistance for urgent and routine care needs. The patient may stop case management services at any time by phone call to the office staff.  Patient's daughter agreed to services and consent obtained.   Assessment: .   Patient's daughter/ legal guardian provided all information during this encounter. Daughter is working to get patient established with a mental health provider and community mental health support .  See Care Plan below for interventions and patient self-care actives.  Recent life changes or stressors: recently moved in with daughter was previously homeless.  Recommendation: Patient may benefit from, and daughter is in agreement to follow up on care plan and goals discussed today.   Follow up Plan: Patient's caregiver would like continued follow-up from CCM LCSW .  per caregiver's request CCM LCSW will follow up in 2 to 3 weeks.  They will call the office if needed prior to next encounter.   Review of patient past medical history, allergies, medications, and health status, including review  of relevant consultants reports was performed today as part of a comprehensive evaluation and provision of chronic care management and care coordination services.  SDOH (Social Determinants of Health) assessments and interventions performed:  SDOH Interventions    Flowsheet Row Most Recent Value  SDOH Interventions   Housing Interventions Other (Comment), Intervention Not Indicated  [currently lives with daughter]        Advanced Directives Status:  has legal guardian   Care Plan  Allergies  Allergen Reactions   Strawberry Extract Anaphylaxis   Risperidone And Related Hives, Nausea Only and Other (See Comments)    Made me feel jittery and restless/ Pt denies this allergy on 01/27/21   Latuda [Lurasidone Hcl] Other (See Comments)    Hot and cold flashes    Outpatient Encounter Medications as of 07/15/2021  Medication Sig   benztropine (COGENTIN) 0.5 MG tablet Take 1 tablet (0.5 mg total) by mouth every 6 (six) hours as needed for tremors (muscle stiffness).   divalproex (DEPAKOTE ER) 500 MG 24 hr tablet Take 2 tablets (1,000 mg total) by mouth daily at 10 pm. (Patient not taking: Reported on 07/07/2021)   doxepin (SINEQUAN) 100 MG capsule Take 1 capsule (100 mg total) by mouth at bedtime.   haloperidol decanoate (HALDOL DECANOATE) 100 MG/ML injection Inject 1 mL (100 mg total) into the muscle every 30 (thirty) days.   meloxicam (MOBIC) 15 MG tablet Take 0.5-1 tablets (7.5-15 mg total) by mouth daily as needed for pain. (Patient not taking: Reported on 07/07/2021)   metoprolol succinate (TOPROL-XL) 25 MG 24 hr tablet Take 1 tablet (25 mg total) by mouth daily.   olanzapine zydis (ZYPREXA) 15 MG disintegrating tablet Take 2 tablets (30 mg  total) by mouth at bedtime.   [DISCONTINUED] potassium chloride SA (K-DUR,KLOR-CON) 20 MEQ tablet Take 1 tablet (20 mEq total) by mouth 2 (two) times daily. (Patient not taking: Reported on 12/11/2018)   No facility-administered encounter medications on  file as of 07/15/2021.    Patient Active Problem List   Diagnosis Date Noted   Delusions ([place]) 05/10/2021   Chest pain 01/20/2016   Dyspnea 10/13/2012   Tobacco abuse 07/06/2012   Palpitations    Schizophrenia spectrum disorder with psychotic disorder type not yet determined ([place])    Congestive heart failure ([place])    BIPOLAR DISORDER 12/29/2006   BACK PAIN, LOW 12/29/2006    Conditions to be addressed/monitored:  Level of care concerns and Mental Health Concerns   Care Plan : General Social Work (Adult)  Updates made by Maurine Cane, LCSW since 07/15/2021 12:00 AM     Problem: Needs Mental Health Provider      Goal: Emotional Health Supported by connecting with [facility]   Start Date: 07/15/2021  This Visit's Progress: On track  Priority: High  Note:   Current barriers:   Severe Persistent Mental Health needs ongoing provider for treatment Needs Support, Education, and Care Coordination in order to meet unmet mental health needs. Clinical Goal(s): patient and daughter will work with mental health provider to address needs related to ongoing treatment Clinical Interventions:  Inter-disciplinary care team collaboration (see longitudinal plan of care) Assessed patient's previous and current treatment, coping skills, support system and barriers to care  Patient is on wait list for ACT Team Will do walk in at [facility] Daughter has started application process for Residential Option with [facility] Review various resources, discussed options and provided patient information about  Options for mental health treatment based on need and insurance Family Care homes, group homes and day programs [facility] program in [place] [facility] ( mental health) Made several calls to explore community resources and options based on patient's need Solution-Focused Strategies, Active listening / Reflection utilized , Emotional  Supportive Provided, Problem Solving /Task Center , Caregiver stress acknowledged , Discussed referral for psychiatry , and Made referral to [facility] as well as [employer] [facility] [place] [phone]  ; Patient Goals/Self-Care Activities: Over the next 21 days Continue with compliance of taking medication  I have placed a referral to [facility] please do walk-in as we discussed Call Lonn [place] at [facility] [phone] for Day Program  I e-mailed you a list of family care and group homes to review Complete application for Resident Housing with [facility]  Call [facility] [phone] to inquire about their residential mental health services      Casimer Lanius, [place] / [place]   [phone] 3:22 PM

## 2021-07-15 NOTE — Patient Instructions (Addendum)
Licensed Clinical Social Worker Visit Information  Goals we discussed today:   Goals Addressed             This Visit's Progress    Find Help in My Community for mental health support       Timeframe:  Short-Term Goal Priority:  High Start Date:   07/15/2021                          Expected End Date:                         Patient Goals/Self-Care Activities: Over the next 21 days Continue with compliance of taking medication  I have placed a referral to [facility] please do walk-in as we discussed Call Kayla at [facility] [phone] for Day Program  I e-mailed you a list of family care and group homes to review Complete application for Resident Housing with [facility]  Call [facility] [phone] to inquire about their residential mental health services      Ms. Zeidman was given information about Care Management services today including:  Care Management services include personalized support from designated clinical staff supervised by her physician, including individualized plan of care and coordination with other care providers 24/7 contact phone numbers for assistance for urgent and routine care needs. The patient may stop Care Management services at any time by phone call to the office staff.  Patient's daughter verbally agreed to assistance and services provided by embedded care coordination/care management team today.  The patient verbalized understanding of instructions, educational materials, and care plan provided today and declined offer to receive copy of patient instructions, educational materials, and care plan.   Follow up plan: SW will follow up with patient by phone over the next 2 to 3 weeks   Casimer Lanius, [place] / [place]   [phone] 3:25 PM

## 2021-07-24 DIAGNOSIS — F250 Schizoaffective disorder, bipolar type: Secondary | ICD-10-CM | POA: Diagnosis not present

## 2021-08-04 ENCOUNTER — Telehealth: Payer: Self-pay | Admitting: Podiatry

## 2021-08-04 NOTE — Telephone Encounter (Signed)
Patient calling, has finished her 90 day supply of the Lamisil and wanted to know if physician wants to refill medication.  She has not had the hepatic liver function test done but will take the order to the lab to be completed.Please advise/schedule for a f/u appointment.  She stated that she does not see an improvement in her nails.

## 2021-08-04 NOTE — Telephone Encounter (Signed)
    1. Which medications need to be refilled? (please list name of each medication and dose if known) Lamsil 250  2. Which pharmacy/location (including street and city if local pharmacy) is medication to be sent to? [place], [place]  3. Do they need a 30 day or 90 day supply? [place]

## 2021-08-05 ENCOUNTER — Encounter: Payer: Medicaid Other | Admitting: Family Medicine

## 2021-08-07 NOTE — Telephone Encounter (Signed)
We only prescribe 90 days as maximum. Should continue to work. On this you are able to explain to patient and don't need to ask me about antifungal

## 2021-08-11 ENCOUNTER — Telehealth: Payer: Self-pay | Admitting: *Deleted

## 2021-08-11 NOTE — Chronic Care Management (AMB) (Signed)
  Care Management   Note  08/11/2021 Name: [name] MRN: [id number] DOB: 1971-09-14  Anita Hill is a 50 y.o. year old female who is a primary care patient of Lilland, Alana, DO and is actively engaged with the care management team. I reached out to [name] by phone today to assist with re-scheduling a follow up visit with the Licensed Clinical Social Worker  Follow up plan: Unsuccessful telephone outreach attempt made. A HIPAA compliant phone message was left for the patient providing contact information and requesting a return call.  The care management team will reach out to the patient again over the next 7 days.  If patient returns call to provider office, please advise to call [place] at [phone].  [facility] Management  Direct Dial: [phone]

## 2021-08-13 ENCOUNTER — Ambulatory Visit (INDEPENDENT_AMBULATORY_CARE_PROVIDER_SITE_OTHER): Payer: Medicaid Other | Admitting: Podiatry

## 2021-08-13 ENCOUNTER — Other Ambulatory Visit: Payer: Self-pay

## 2021-08-13 ENCOUNTER — Encounter: Payer: Self-pay | Admitting: Podiatry

## 2021-08-13 DIAGNOSIS — B351 Tinea unguium: Secondary | ICD-10-CM | POA: Diagnosis not present

## 2021-08-13 DIAGNOSIS — G629 Polyneuropathy, unspecified: Secondary | ICD-10-CM

## 2021-08-14 NOTE — Progress Notes (Signed)
.   Subjective:   Patient ID: Anita Hill, female   DOB: 50 y.o.   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  Patient at this point will be seen back encouraged to call questions concerns and we may have to consider changing medications but at this point continue the same path that she is on

## 2021-08-18 NOTE — Chronic Care Management (AMB) (Signed)
  Care Management   Note  08/18/2021 Name: RENEE ERB MRN: [id number] DOB: [birth year]  Anita Hill is a 50 y.o. year old female who is a primary care patient of Lilland, Alana, DO and is actively engaged with the care management team. I reached out to [name] by phone today to assist with re-scheduling a follow up visit with the Licensed Clinical Social Worker  Follow up plan: A second unsuccessful telephone outreach attempt made. A HIPAA compliant phone message was left for the patient providing contact information and requesting a return call. The care management team will reach out to the patient again over the next 7 days. If patient returns call to provider office, please advise to call [place] at [phone].  [facility] Management  Direct Dial: [phone]

## 2021-08-19 ENCOUNTER — Other Ambulatory Visit: Payer: Self-pay

## 2021-08-19 ENCOUNTER — Ambulatory Visit (INDEPENDENT_AMBULATORY_CARE_PROVIDER_SITE_OTHER): Payer: Medicaid Other | Admitting: Family Medicine

## 2021-08-19 ENCOUNTER — Encounter: Payer: Self-pay | Admitting: Family Medicine

## 2021-08-19 VITALS — BP 120/80 | HR 88 | Ht 62.0 in | Wt 160.1 lb

## 2021-08-19 DIAGNOSIS — Z0001 Encounter for general adult medical examination with abnormal findings: Secondary | ICD-10-CM | POA: Diagnosis not present

## 2021-08-19 DIAGNOSIS — R0789 Other chest pain: Secondary | ICD-10-CM

## 2021-08-19 NOTE — Progress Notes (Signed)
Subjective:  CC -- Annual Physical; With complaints of intermittent chest pain Pt reports she has a history of palpitations/chest pain that have been present for several years.  She reports that 2 days ago she began to have a heavy feeling on her chest that subsided on her own with different movements.  She says it comes and goes and feels like a tightness with occasional shortness of breath.  She reports that she was previously treated with diazepam and nitroglycerin in 2015 but that they did not improve her symptoms, she states she started to drink brandy which would help until she began drinking it too regularly.  She reports that she has previously seen cardiology Dr. Stanford Breed for work-up and was told she had mitral valve regurgitation she is gone to the ED multiple times with similar complaints and was found to have LVH. She is very concerned about her LVH and she says she has not had much further work-up for it and that she "felt the left side of my heart tear and I feel when it is bleeding".  Due to her cardiovascular concerns, we were not able to discuss many of her healthcare maintenance screenings.  Cardiovascular: - Risk as of 08/19/2021: 0.6% (assessment every 3-5 years) - Dx Hypertension: No - Dx Hyperlipidemia: No - Dx Obesity: no  - Physical Activity: yes  - Diabetes: Last A1c was 6.0 on 05/08/2021  Cancer: Colorectal >> Colonoscopy: no we will discuss at next visit Lung >> Tobacco Use: yes 1 PPD until 2005  - If so, previous Low-Dose CT screen: no  Breast >> Mammogram: none done  - Vaginal Bleeding: no - Pap Smear: Unsure exact date of last pap, history somewhat unreliable.  - Previous Abnormal Pap: no, not that patient is aware of Skin >> Suspicious lesions: no   Social: Alcohol Use: no  Tobacco Use: previously   - Interested in [place]: states that she quit in 2005  Other Drugs: patient reports no  Risky Sexual Behavior: patient reports no  Depression: yes, mild  in the setting of schizophrenia and bipolar   - PHQ9 score: 7 Support and Life at Home: yes   Other: Osteoporosis: no Zoster Vaccine: no, will discuss Flu Vaccine: no  Pneumonia Vaccine: no  ROS- per HPI  Past Medical History Patient Active Problem List   Diagnosis Date Noted   Delusions ([place]) 05/10/2021   Chest pain 01/20/2016   Dyspnea 10/13/2012   Tobacco abuse 07/06/2012   Palpitations    Schizophrenia spectrum disorder with psychotic disorder 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 60 tablet 0   divalproex (DEPAKOTE ER) 500 MG 24 hr tablet Take 2 tablets (1,000 mg total) by mouth daily at 10 pm. (Patient not taking: Reported on 07/07/2021) 30 tablet 0   doxepin (SINEQUAN) 100 MG capsule Take 1 capsule (100 mg total) by mouth at bedtime. 30 capsule 0   haloperidol decanoate (HALDOL DECANOATE) 100 MG/ML injection Inject 1 mL (100 mg total) into the muscle every 30 (thirty) days. 1 mL 0   meloxicam (MOBIC) 15 MG tablet Take 0.5-1 tablets (7.5-15 mg total) by mouth daily as needed for  pain. (Patient not taking: Reported on 07/07/2021) 30 tablet 6   metoprolol succinate (TOPROL-XL) 25 MG 24 hr tablet Take 1 tablet (25 mg total) by mouth daily. 30 tablet 0   olanzapine zydis (ZYPREXA) 15 MG disintegrating tablet Take 2 tablets (30 mg total) by mouth at bedtime. 60 tablet 0   No current facility-administered medications for this visit.    Objective: BP 120/80   Pulse 88   Ht 5\' 2"  (1.575 m)   Wt 160 lb 2 oz (72.6 kg)   SpO2 100%   BMI 29.29 kg/m  Gen: NAD, alert, cooperative with exam HEENT: NCAT, EOMI, PERRL CV: RRR, good S1/S2, no murmur Resp:  CTABL, no wheezes, non-labored Abd: Soft, Non Tender, Non Distended, BS present, no guarding or organomegaly Genital Exam: not done Ext: No edema, warm Neuro: Alert and oriented, No gross deficits   Assessment/Plan:  Atypical chest pain Patient reports atypical chest pain that has been present for several years that occurred again in the last several days.  Patient has previously been worked up and only notable findings for EKG with LVH and mild atrial enlargement.  Patient has previously seen cardiology in 2017 and had an echocardiogram done which showed only trivial mitral valve regurgitation and negative stress test.  As pain has unchanged and patient reports no improvement with previous nitroglycerin or other medications, do not feel that this is cardiac in origin at this time.  The most recent EKG was on 05/08/2021 and was unchanged from prior.  Calculated cardiovascular risk is 0.6%.  Do not feel that a referral to cardiology is warranted at this time and believe that this may more so be related to patient's psychiatric conditions combined with anxiety and palpitations.  Discussed with patient that cardiology may not be appropriate at this time, patient should bring her daughter to her next visit.  Healthcare maintenance - Continue to counsel patient on recommendations for vaccines including: COVID, flu, shingles, pneumococcal vaccine -Pap smear to be completed - Colonoscopy to good completed - Mammogram to be completed Discussed with patient to bring her daughter to her next visit so that we can coordinate and organize patient's appointments and address concerns.   Rise Patience, DO, PGY-2 08/19/2021 12:17 PM

## 2021-08-19 NOTE — Patient Instructions (Signed)
I do not think that the pain you have been having is an emergency at this time. I do not think that cardiology will have a useful answer for you at this time.  I want you to schedule another appointment and have your daughter come with you at that time so we can make sure that everything is squared away for you.  If you begin to have extreme chest pain that will not go away, shortness of breath or pain radiating to your jaw or arm please go to the ER for evaluation.

## 2021-08-25 NOTE — Chronic Care Management (AMB) (Signed)
  Care Management   Note  08/25/2021 Name: Anita Hill MRN: [id number] DOB: 01/20/1971  Anita Hill is a 50 y.o. year old female who is a primary care patient of Lilland, Alana, DO and is actively engaged with the care management team. I reached out to [name] by phone today to assist with scheduling a follow up visit with the Licensed Clinical Social Worker  Follow up plan: A third unsuccessful telephone outreach attempt made. A HIPAA compliant phone message was left for the patient providing contact information and requesting a return call. Unable to make contact on outreach attempts x 3. PCP Rise Patience, DO notified via routed documentation in medical record. We have been unable to make contact with the patient for follow up. The care management team is available to follow up with the patient after provider conversation with the patient regarding recommendation for care management engagement and subsequent re-referral to the care management team.   [place] Management  Direct Dial: [phone]

## 2021-08-29 ENCOUNTER — Other Ambulatory Visit: Payer: Self-pay | Admitting: Family Medicine

## 2021-09-11 ENCOUNTER — Ambulatory Visit (HOSPITAL_COMMUNITY)
Admission: EM | Admit: 2021-09-11 | Discharge: 2021-09-11 | Disposition: A | Discharge status: Home / Self Care | Payer: Medicaid Other | Source: Home / Self Care

## 2021-09-11 ENCOUNTER — Other Ambulatory Visit: Payer: Self-pay

## 2021-09-11 ENCOUNTER — Emergency Department (HOSPITAL_COMMUNITY)
Admission: EM | Admit: 2021-09-11 | Discharge: 2021-09-11 | Disposition: A | Discharge status: Home / Self Care | Payer: Medicaid Other | Attending: Emergency Medicine | Admitting: Emergency Medicine

## 2021-09-11 DIAGNOSIS — Z760 Encounter for issue of repeat prescription: Secondary | ICD-10-CM | POA: Insufficient documentation

## 2021-09-11 DIAGNOSIS — F29 Unspecified psychosis not due to a substance or known physiological condition: Secondary | ICD-10-CM

## 2021-09-11 DIAGNOSIS — M25569 Pain in unspecified knee: Secondary | ICD-10-CM | POA: Insufficient documentation

## 2021-09-11 DIAGNOSIS — I509 Heart failure, unspecified: Secondary | ICD-10-CM | POA: Insufficient documentation

## 2021-09-11 DIAGNOSIS — F17210 Nicotine dependence, cigarettes, uncomplicated: Secondary | ICD-10-CM | POA: Diagnosis not present

## 2021-09-11 MED ORDER — OLANZAPINE 15 MG PO TBDP: 15.0000 mg | ORAL_TABLET | Freq: Every day | ORAL | 1 refills | Status: DC

## 2021-09-11 MED ORDER — DOXEPIN HCL 100 MG PO CAPS: ORAL_CAPSULE | ORAL | 1 refills | Status: DC

## 2021-09-11 MED ORDER — DIVALPROEX SODIUM ER 500 MG PO TB24: 1000.0000 mg | ORAL_TABLET | Freq: Every day | ORAL | 1 refills | Status: DC

## 2021-09-11 NOTE — Discharge Instructions (Signed)
You are seen in the Emergency Department today for medication refill.  Please present to behavioral health urgent care to see Beatriz Stallion who is a Designer, jewellery who is aware that you are on the way.  They will be able to set you up with medications and follow-up with a psychiatrist.

## 2021-09-11 NOTE — ED Provider Notes (Signed)
[facility] EMERGENCY DEPARTMENT Provider Note   CSN: [id number] Arrival date & time: 09/11/21  0805     History Chief Complaint  Patient presents with   Knee Pain   Medication Refill    Anita Hill is a 50 y.o. female.  With a history of schizophrenia, bipolar, homelessness who presents to the emergency department with legal guardian, daughter for assistance with medication refill.  Daughter states that her psych medications were refilled by her primary care physician about a month ago.  This gave them time to get in with psychiatry and establish care.  She states that she has gone every week to [facility] and has been unable to see a psychiatrist or get her prescriptions filled.  She states that she went this morning to [facility] walk-in clinic and states that they were not seeing patients today and they advised her to come to the emergency department.  Patient has a diagnosis of schizophrenia and takes multiple antipsychotics.  She denies SI/HI.  The daughter states that she has been responding to external stimuli more recently.  She states that she will hear her around the house telling "somebody to get off of me."  The patient does not appear to be responding to external stimuli or internal stimuli at bedside.  She is alert and oriented.     Knee Pain Medication Refill     Past Medical History:  Diagnosis Date   BIPOLAR DISORDER    Delusions ([place])    Homelessness    Left ventricular hypertrophy    Palpitations    Psychosis ([place])    Sickle cell trait ([place])    Substance abuse ([place])     Patient Active Problem List   Diagnosis Date Noted   Delusions ([place]) 05/10/2021   Chest pain 01/20/2016   Dyspnea 10/13/2012   Tobacco abuse 07/06/2012   Palpitations    Schizophrenia spectrum disorder with psychotic disorder type not yet determined ([place])    Congestive heart failure ([place])    Bipolar I disorder ([place]) 12/29/2006   BACK PAIN, LOW 12/29/2006    Past  Surgical History:  Procedure Laterality Date   BUNIONECTOMY       OB History     Gravida  1   Para      Term      Preterm      AB      Living         SAB      IAB      Ectopic      Multiple      Live Births              Family History  Problem Relation Age of Onset   Heart disease Mother        CHF   Prostate cancer Father     Social History   Tobacco Use   Smoking status: Every Day    Packs/day: 1.00    Years: 10.00    Pack years: 10.00    Types: Cigarettes, Cigars   Smokeless tobacco: Never  Vaping Use   Vaping Use: Never used  Substance Use Topics   Alcohol use: Yes    Comment: everyday 20-80oz   Drug use: No    Comment: Previous cocaine, heroin, marijuana    Home Medications Prior to Admission medications   Medication Sig Start Date End Date Taking? Authorizing Provider  doxepin (SINEQUAN) 100 MG capsule TAKE 1 CAPSULE(100 MG) BY MOUTH AT BEDTIME  08/31/21   Lilland, Alana, DO  benztropine (COGENTIN) 0.5 MG tablet Take 1 tablet (0.5 mg total) by mouth every 6 (six) hours as needed for tremors (muscle stiffness). 07/07/21   Lilland, Alana, DO  divalproex (DEPAKOTE ER) 500 MG 24 hr tablet Take 2 tablets (1,000 mg total) by mouth daily at 10 pm. Patient not taking: Reported on 07/07/2021 05/18/21   Sharma Covert, MD  haloperidol decanoate (HALDOL DECANOATE) 100 MG/ML injection Inject 1 mL (100 mg total) into the muscle every 30 (thirty) days. 07/07/21   Lilland, Alana, DO  meloxicam (MOBIC) 15 MG tablet Take 0.5-1 tablets (7.5-15 mg total) by mouth daily as needed for pain. Patient not taking: Reported on 07/07/2021 06/12/21   Hilts, Legrand Como, MD  metoprolol succinate (TOPROL-XL) 25 MG 24 hr tablet Take 1 tablet (25 mg total) by mouth daily. 07/07/21   Lilland, Alana, DO  olanzapine zydis (ZYPREXA) 15 MG disintegrating tablet Take 2 tablets (30 mg total) by mouth at bedtime. 07/07/21   Lilland, Alana, DO  potassium chloride SA (K-DUR,KLOR-CON) 20 MEQ  tablet Take 1 tablet (20 mEq total) by mouth 2 (two) times daily. Patient not taking: Reported on 12/11/2018 01/20/16 05/23/19  Lelon Perla, MD    Allergies    Strawberry extract, Risperidone and related, and Latuda [lurasidone hcl]  Review of Systems   Review of Systems  Psychiatric/Behavioral:  Positive for behavioral problems.   All other systems reviewed and are negative.  Physical Exam Updated Vital Signs BP (!) 130/97   Pulse 82   Temp 98.8 F (37.1 C) (Oral)   Resp 14   SpO2 96%   Physical Exam Vitals and nursing note reviewed.  Constitutional:      General: She is not in acute distress.    Appearance: Normal appearance. She is not toxic-appearing.  HENT:     Head: Normocephalic and atraumatic.  Eyes:     General: No scleral icterus.    Extraocular Movements: Extraocular movements intact.  Cardiovascular:     Pulses: Normal pulses.  Pulmonary:     Effort: Pulmonary effort is normal. No respiratory distress.  Musculoskeletal:        General: Normal range of motion.  Skin:    General: Skin is warm and dry.     Capillary Refill: Capillary refill takes less than 2 seconds.     Findings: No rash.  Neurological:     General: No focal deficit present.     Mental Status: She is alert and oriented to person, place, and time. Mental status is at baseline.  Psychiatric:        Attention and Perception: Attention normal.        Mood and Affect: Mood and affect normal.        Speech: Speech normal.        Behavior: Behavior normal. Behavior is not agitated. Behavior is cooperative.        Thought Content: Thought content normal. Thought content is not paranoid or delusional. Thought content does not include homicidal or suicidal ideation.        Cognition and Memory: Cognition normal.        Judgment: Judgment normal.    ED Results / Procedures / Treatments   Labs (all labs ordered are listed, but only abnormal results are displayed) Labs Reviewed - No data to  display  EKG None  Radiology No results found.  Procedures Procedures   Medications Ordered in ED Medications - No data to display  ED Course  I have reviewed the triage vital signs and the nursing notes.  Pertinent labs & imaging results that were available during my care of the patient were reviewed by me and considered in my medical decision making (see chart for details).    MDM Rules/Calculators/A&P 50 year old female who presents emergency department with need for medication refills.  She takes injectable haloperidol, olanzapine, Depakote, Cogentin.  I have spoken with Dr. Serafina Mitchell, MD from [facility] who states she is at the facility today. I have also spoken with Beatriz Stallion, NP at [facility] who states that she can see the patient today. I have instructed the patient to present to [facility] and Zenia Resides, NP will be available to assist with medication refills and coordinating appointment with psychiatry.  - I have discussed the plan with legal guardian at bedside who is taking patient to [facility] upon discharge by POV. She verbalizes understanding of plan.  Patient is safe for discharge Final Clinical Impression(s) / ED Diagnoses Final diagnoses:  Medication refill    Rx / DC Orders ED Discharge Orders     None        Mickie Hillier, PA-C 09/11/21 1309    Pattricia Boss, MD 09/12/21 [phone]

## 2021-09-11 NOTE — ED Notes (Signed)
Pt and daughter Anita Hill were given a copy of AVS.  Follow up appointments and medication regime were gone over and Daughter  and pt verbalized understanding.  They were given all their belongings back by security and escorted to the lobby without incident.   No distress noted.

## 2021-09-11 NOTE — Progress Notes (Signed)
   09/11/21 1306  [place] (Walk-ins at [facility] only)  How Did You Hear About [place]? Family/Friend  What Is the Reason for Your Visit/Call Today? Pt is a 50 yo female who presented voluntarily and accompanied by her daughter [name] who is also her legal guardian. Pt denies SI, HI, NSSH and AVH. Pt did describe periods in which she stated she thinks she hears someone or something sneaking up on her while she is outside smoking (a person, a bear or maybe a deer.) When asked if she feels as if someone or something is trying to remotely control her she stated "I don't feel safe to say or talk about that." Pt uses alcohol weekly with last use about a week ago. This resulted in charges for public intoxication and impeding traffic which are pending. Pt stated she was trying to cross the street not harm herself at the time. Hx of free base cocaine use but pt stated she is 21 yrs "clean." Daughter/legal guardian stated that there was more going on with the guardianship but she did not want to discuss it because it would upset her mother/pt. Daughter stated their purpose today was to get help in establishing care upstairs with OP which she stated she has tried several times and failed. Daughter also stated that pt needs meds refilled.  How Long Has This Been Causing You Problems? > than 6 months  Have You Recently Had Any Thoughts About Hurting Yourself? No  Are You Planning to Commit Suicide/Harm Yourself At This time? No  Have you Recently Had Thoughts About [place]? No  Are You Planning To Harm Someone At This Time? No  Are you currently experiencing any auditory, visual or other hallucinations? No  Have You Used Any Alcohol or Drugs in the Past 24 Hours? No  Do you have any current medical co-morbidities that require immediate attention? No (None reported)  Clinician description of patient physical appearance/behavior: Pt was alert and oriented x 4. She was calm and cooperative. Pt's  mood seemed euthymic but she also stated that she felt hopeless and helpless due to the guradianship. Pt's speech and movement seemed to be within normal limits. Pt's thought processes seemed somewhat disorganized and she did ramble at times going off into tangents. While rambling she stayed on related topics.  What Do You Feel Would Help You the Most Today? Treatment for Depression or other mood problem;Medication(s)  If access to [facility] was not available, would you have sought care in the Emergency Department? Yes  Determination of Need Routine (7 days)  Options For Referral Outpatient Therapy;Medication Management  [name] T. Mare Ferrari, [place], [facility], [facility]  [facility] Triage Specialist [facility]

## 2021-09-11 NOTE — ED Triage Notes (Signed)
Pt arrives with guardian for eval of left knee pain ongoing x 1 year. Pt also needs a psychiatrist and they have been to [facility] x 4 but today they refused them and said they do not have a doctor in house. Pt given one time refill of all psych meds and is now out and PCP will not refill because out of scope. Guardian would also like resources for her mother. Denies SI/HI and AVH, however daughter reports she is responding to external stimuli.

## 2021-09-11 NOTE — ED Provider Notes (Signed)
Behavioral Health Urgent Care Medical Screening Exam  Patient Name: Anita Hill MRN: [id number] Date of Evaluation: 09/11/21 Diagnosis:  Final diagnoses:  Schizophrenia spectrum disorder with psychotic disorder type not yet determined ([place])  Encounter for medication refill    History of Present illness: Anita Hill is a 50 y.o. female patient who presents to the [facility] voluntarily as a walk-in accompanied by her daughter/legal guardian [name] with a chief complaint of medication refill.  Patient seen and evaluated face-to-face by this provider with her daughter [name] present, chart reviewed and case discussed with Dr. Dwyane Dee.   On evaluation, patient is alert and oriented x4. Her thought process is logical and speech is coherent. Her mood is euthymic and affect is congruent. She denies SI/HI/AVH. She does not appear objectively to be responding to internal or external stimuli.  She reports that she needs her medications refilled because her PCP cannot continue to refill her medications because it is not in her scope of practice. She states that her PCP refilled her medications one time and recommended that she come here for medication refills. She states that she went upstairs (outpatient) today for medication management but was told that there was no doctors today. Her daughter Lana Fish states that the last time she brought her mother here for medication management at 9:30 AM she was told that she came too late. Lana Fish states that she brought her mother here (outpatient) another time at 8 AM and was told that she will need to come at 7:30 AM. The patient states that she has not been able to get the services she needs. She reports that she has been off her medications for 2 weeks. She states that she is prescribed doxepin 100 mg nightly, Cogentin 0.5 mg as needed, Depakote 1000 mg nightly, Haldol Decanoate 100 mg every 30 days, Zyprexa 30 mg  nightly. She states that she has not received the Haldol injection since she was hospitalized in July 2022. She states that she does not have a current therapist or psychiatrist. She denies feeling depressed or anxious. She reports fair sleep. She reports a fair appetite.  When asked about hallucinations or paranoia she states that she told the counselor that when she goes outside to smoke cigarettes at night she is worried about a deer coming out of the woods. She states that she resides with her daughter. She reports hx  of free base cocaine use but pt stated she is 21 yrs "clean."  I discussed with Dr. Dwyane Dee the patient's medication history (per chart review from last hosp encounter) and the following recommendations were made: continue doxepin 100 mg nightly, Depakote 1000 mg nightly, decrease Zyprexa from 30 mg to 15 mg nightly, hold cogentin 0.5 mg and schedule an appt with outpatient for Haldol LAI. The stated medication changes were discussed with the patient and her legal guardian Lana Fish. Both verbalizes understanding and agrees to the stated plan. Prescriptions were sent electronically to the pt's pharmacy on file with 1 refill.     Psychiatric Specialty Exam  Presentation  General Appearance:Appropriate for Environment  Eye Contact:Fair  Speech:Clear and Coherent  Speech Volume:Normal  Handedness:Right   Mood and Affect  Mood:Euthymic  Affect:Appropriate   Thought Process  Thought Processes:Coherent  Descriptions of Associations:Intact  Orientation:Full (Time, Place and Person)  Thought Content:Logical  Diagnosis of Schizophrenia or Schizoaffective disorder in past: Yes  Duration of Psychotic Symptoms: Greater than six months  Hallucinations:None  Ideas of  Reference:None  Suicidal Thoughts:No  Homicidal Thoughts:No   Sensorium  Memory:Immediate Fair; Recent Fair; Remote Fair  Judgment:Fair  Insight:Fair   Executive Functions   Concentration:Fair  Attention Span:Fair  [place]   Psychomotor Activity  Psychomotor Activity:Normal   Assets  Assets:Communication Skills; Desire for Improvement; Housing; Physical Health; Social Support   Sleep  Sleep:Fair  Number of hours: 6   No data recorded  Physical Exam: Physical Exam Constitutional:      Appearance: Normal appearance.  HENT:     Head: Normocephalic.     Nose: Nose normal.  Eyes:     Conjunctiva/sclera: Conjunctivae normal.  Cardiovascular:     Rate and Rhythm: Normal rate.  Pulmonary:     Effort: Pulmonary effort is normal.  Musculoskeletal:     Cervical back: Normal range of motion.  Neurological:     Mental Status: She is alert and oriented to person, place, and time.   Review of Systems  Constitutional: Negative.   HENT: Negative.    Eyes: Negative.   Respiratory: Negative.    Cardiovascular: Negative.   Gastrointestinal: Negative.   Genitourinary: Negative.   Musculoskeletal: Negative.   Skin: Negative.   Neurological: Negative.   Blood pressure (!) 145/101, pulse 99, temperature 98.6 F (37 C), temperature source Oral, resp. rate 16, SpO2 100 %. There is no height or weight on file to calculate BMI.  Musculoskeletal: Strength & Muscle Tone: within normal limits Gait & Station: normal Patient leans: N/A   [place] MSE Discharge Disposition for Follow up and Recommendations: Based on my evaluation the patient does not appear to have an emergency medical condition and can be discharged with resources and follow up care in outpatient services for Medication Management, Individual Therapy, and Group Therapy  Follow up recommendations:   [place]. Go on 10/01/2021.   Specialty: Urgent Care Why: appointment for medication management/LAI at 2:00 pm Contact information: [place]  [place]. Go on 11/17/2021.   Specialty: Urgent Care Why: therapy appointment at 1:00 pm Contact information: [place] [phone]                Marissa Calamity, NP 09/11/2021, 1:48 PM

## 2021-09-11 NOTE — Discharge Instructions (Signed)

## 2021-09-15 ENCOUNTER — Other Ambulatory Visit: Payer: Self-pay | Admitting: Family Medicine

## 2021-09-16 ENCOUNTER — Telehealth (HOSPITAL_COMMUNITY): Payer: Self-pay | Admitting: Family Medicine

## 2021-09-16 NOTE — BH Assessment (Signed)
Care Management - Follow Discharges   Writer attempted to make contact with patient today and was unsuccessful.  Patient phone number listed in epic is not in service.   Per chart review, pt provided with outpatient resources

## 2021-09-22 ENCOUNTER — Ambulatory Visit: Payer: Medicaid Other | Admitting: Family Medicine

## 2021-10-01 ENCOUNTER — Ambulatory Visit (HOSPITAL_COMMUNITY): Payer: Medicaid Other

## 2021-10-01 ENCOUNTER — Ambulatory Visit (HOSPITAL_COMMUNITY): Payer: Medicaid Other | Admitting: *Deleted

## 2021-10-01 ENCOUNTER — Ambulatory Visit (INDEPENDENT_AMBULATORY_CARE_PROVIDER_SITE_OTHER): Payer: Medicaid Other | Admitting: Student in an Organized Health Care Education/Training Program

## 2021-10-01 ENCOUNTER — Encounter (HOSPITAL_COMMUNITY): Payer: Self-pay | Admitting: Student in an Organized Health Care Education/Training Program

## 2021-10-01 ENCOUNTER — Other Ambulatory Visit: Payer: Self-pay

## 2021-10-01 ENCOUNTER — Encounter (HOSPITAL_COMMUNITY): Payer: Self-pay

## 2021-10-01 VITALS — BP 151/110 | HR 55 | Ht 62.0 in | Wt 164.0 lb

## 2021-10-01 DIAGNOSIS — F29 Unspecified psychosis not due to a substance or known physiological condition: Secondary | ICD-10-CM | POA: Diagnosis not present

## 2021-10-01 MED ORDER — DOXEPIN HCL 100 MG PO CAPS: ORAL_CAPSULE | ORAL | 1 refills | Status: DC

## 2021-10-01 MED ORDER — HALOPERIDOL DECANOATE 100 MG/ML IM SOLN
100.0000 mg | Freq: Once | INTRAMUSCULAR | Status: AC
  Administered 2021-10-01: 100 mg via INTRAMUSCULAR

## 2021-10-01 MED ORDER — HALOPERIDOL DECANOATE 100 MG/ML IM SOLN: 100.0000 mg | INTRAMUSCULAR | 2 refills | Status: DC

## 2021-10-01 MED ORDER — OLANZAPINE 15 MG PO TBDP: 15.0000 mg | ORAL_TABLET | Freq: Every day | ORAL | 1 refills | Status: DC

## 2021-10-01 MED ORDER — BENZTROPINE MESYLATE 0.5 MG PO TABS: 0.5000 mg | ORAL_TABLET | Freq: Four times a day (QID) | ORAL | 0 refills | Status: DC | PRN

## 2021-10-01 MED ORDER — DIVALPROEX SODIUM ER 500 MG PO TB24: 1000.0000 mg | ORAL_TABLET | Freq: Every day | ORAL | 0 refills | Status: DC

## 2021-10-01 NOTE — Progress Notes (Signed)
Psychiatric Initial Adult Assessment   Patient Identification: Anita Hill MRN:  [id number] Date of Evaluation:  10/01/2021 Referral Source: [place] Chief Complaint:   Chief Complaint   Medication Management   Establish Care Visit Diagnosis:    ICD-10-CM   1. Schizophrenia spectrum disorder with psychotic disorder type not yet determined ([place])  F29 divalproex (DEPAKOTE ER) 500 MG 24 hr tablet    benztropine (COGENTIN) 0.5 MG tablet    doxepin (SINEQUAN) 100 MG capsule    haloperidol decanoate (HALDOL DECANOATE) 100 MG/ML injection    OLANZapine zydis (ZYPREXA) 15 MG disintegrating tablet      History of Present Illness:    Anita Hill is a 50 yr old female who presents to [place] for Medication Management.  PPHx is significant for: has a Legal Guardian, EtOH Abuse, Schizoaffective Bipolar, Cocaine Abuse, prior suicide attempt, 6 previous hospitalizations (latest [facility] 7/22)  She reports that she is here today to establish care.  She reports she has attempted to be seen a few times because she wants to continue with her medications.  She reports that she has been doing well on her medications.  When asked what she was taking she reported she did not know as her daughter would put the medicines in a pill box for her and she would just take what she was supposed to.  When asked what her previous psychiatric diagnoses were she reported that when she was pregnant and coming off of substances was told she was bipolar but when she saw in her daughters paperwork that she had been listed as having schizophrenia she stated this was not true that she does not have schizophrenia.  She then states that she would shadow box around the house and this made other people uncomfortable.  She states that she felt like people were punching her and she had cuts and scrapes on her that could not be explained otherwise but that she would put cocoa butter on them to try to cover them up and let them  heal.  She reports that she is never had any AVH.  She reports that the medicines she has been put on in the past were for depression which she states she does not have.  She states previous medications she has been on have been Risperdal, Seroquel, Zyprexa, and Abilify.  She then stated "I do not let the left hand know what the right hand is doing."  She states that her daughter is her legal guardian but she does not understand why because things are temporary.  She then talked about how she had a job that [employer] last year but that this was the "December from hell."  She states that in the span of about 1 to 2 months she had multiple family members died and so this was a very stressful time for her.  She states she is currently unemployed.  She states that she has been attempting to take classes at the community college but issues keep occurring such as her not remembering where her classes are for her to attend them.  She reports that she currently lives with her daughter and future son-in-law.  She reports that she did start drinking a few years after being sober for approximately 20 years.  She states currently she drinks about 3 drinks equivalents a day.  She reports that she is wanting to cut back as she wants to accomplish other things with her life.  She reports that drinking is no  longer fun and that the people she meets are not necessarily the best.  She reports that she does smoke cigars.  She reports 3 a day.  She reports occasional THC use but states its not that often.  When she came to the [facility] 11/11 patient and guardian agreed to continue doxepin 100 mg nightly, Depakote 1000 mg nightly, and starting Haldol LAI so will reorder them and will have her receive her first dose of Haldol Dec today.   Associated Signs/Symptoms: Depression Symptoms:   Reports none (Hypo) Manic Symptoms:   Reports none Anxiety Symptoms:   Reports None Psychotic Symptoms:  Hallucinations: Tactile PTSD  Symptoms: Negative  Past Psychiatric History: Has a Legal Guardian, EtOH Abuse, Schizoaffective Bipolar, Cocaine Abuse, prior suicide attempt, 6 previous hospitalizations (latest [facility] 7/22)  Previous Psychotropic Medications: Yes  Risperdal, Seroquel, Zyprexa, Abilify, Depakote, Doxepin, Haldol  Substance Abuse History in the last 12 months:  Yes.    Consequences of Substance Abuse: NA  Past Medical History:  Past Medical History:  Diagnosis Date   BIPOLAR DISORDER    Delusions ([place])    Homelessness    Left ventricular hypertrophy    Palpitations    Psychosis (HCC)    Sickle cell trait (HCC)    Substance abuse ([place])     Past Surgical History:  Procedure Laterality Date   BUNIONECTOMY      Family Psychiatric History: Maternal Uncle - EtOH abuse  Family History:  Family History  Problem Relation Age of Onset   Heart disease Mother        CHF   Prostate cancer Father     Social History:   Social History   Socioeconomic History   Marital status: Single    Spouse name: Not on file   Number of children: 2   Years of education: Not on file   Highest education level: Not on file  Occupational History    Comment: Unemployed  Tobacco Use   Smoking status: Every Day    Packs/day: 1.00    Years: 10.00    Pack years: 10.00    Types: Cigarettes, Cigars   Smokeless tobacco: Never  Vaping Use   Vaping Use: Never used  Substance and Sexual Activity   Alcohol use: Yes    Comment: everyday 20-80oz   Drug use: No    Comment: Previous cocaine, heroin, marijuana   Sexual activity: Not Currently  Other Topics Concern   Not on file  Social History Narrative   Not on file   Social Determinants of Health   Financial Resource Strain: Low Risk    Difficulty of Paying Living Expenses: Not hard at all  Food Insecurity: No Food Insecurity   Worried About Charity fundraiser in the Last Year: Never true   Ran Out of Food in the Last Year: Never true  Transportation Needs:  No Transportation Needs   Lack of Transportation (Medical): No   Lack of Transportation (Non-Medical): No  Physical Activity: Not on file  Stress: Not on file  Social Connections: Not on file    Additional Social History: Currently unemployed, has some college courses  Allergies:   Allergies  Allergen Reactions   Strawberry Extract Anaphylaxis   Risperidone And Related Hives, Nausea Only and Other (See Comments)    Made me feel jittery and restless/ Pt denies this allergy on 01/27/21   Latuda [Lurasidone Hcl] Other (See Comments)    Hot and cold flashes    Metabolic Disorder Labs:  Lab Results  Component Value Date   HGBA1C 6.0 (H) 05/08/2021   MPG 125.5 05/08/2021   Lab Results  Component Value Date   PROLACTIN 2.6 (L) 05/08/2021   Lab Results  Component Value Date   CHOL 201 (H) 05/08/2021   TRIG 66 05/08/2021   HDL 110 05/08/2021   CHOLHDL 1.8 05/08/2021   VLDL 13 05/08/2021   LDLCALC 78 05/08/2021   Lab Results  Component Value Date   TSH 3.006 05/08/2021    Therapeutic Level Labs: No results found for: LITHIUM Lab Results  Component Value Date   CBMZ <2.0 (L) 01/08/2008   Lab Results  Component Value Date   VALPROATE 50 05/16/2021    Current Medications: Current Outpatient Medications  Medication Sig Dispense Refill   benztropine (COGENTIN) 0.5 MG tablet Take 1 tablet (0.5 mg total) by mouth every 6 (six) hours as needed for tremors (muscle stiffness). 60 tablet 0   divalproex (DEPAKOTE ER) 500 MG 24 hr tablet Take 2 tablets (1,000 mg total) by mouth daily at 10 pm. 60 tablet 0   doxepin (SINEQUAN) 100 MG capsule TAKE 1 CAPSULE(100 MG) BY MOUTH AT BEDTIME 30 capsule 1   haloperidol decanoate (HALDOL DECANOATE) 100 MG/ML injection Inject 1 mL (100 mg total) into the muscle every 30 (thirty) days. 1 mL 2   meloxicam (MOBIC) 15 MG tablet Take 0.5-1 tablets (7.5-15 mg total) by mouth daily as needed for pain. (Patient not taking: Reported on 07/07/2021) 30  tablet 6   metoprolol succinate (TOPROL-XL) 25 MG 24 hr tablet TAKE 1 TABLET(25 MG) BY MOUTH DAILY 30 tablet 3   OLANZapine zydis (ZYPREXA) 15 MG disintegrating tablet Take 1 tablet (15 mg total) by mouth at bedtime. 30 tablet 1   No current facility-administered medications for this visit.    Musculoskeletal: Strength & Muscle Tone: within normal limits Gait & Station: normal Patient leans: N/A  Psychiatric Specialty Exam: Review of Systems  Respiratory:  Negative for cough and shortness of breath.   Cardiovascular:  Negative for chest pain.  Gastrointestinal:  Negative for abdominal pain, constipation, diarrhea, nausea and vomiting.  Neurological:  Negative for weakness and headaches.  Psychiatric/Behavioral:  Negative for hallucinations, sleep disturbance and suicidal ideas. The patient is not nervous/anxious.    Blood pressure (!) 151/110, pulse (!) 55, height 5\' 2"  (1.575 m), weight 164 lb (74.4 kg), SpO2 98 %.Body mass index is 30 kg/m.  General Appearance: Casual, Fairly Groomed, and Neat  Eye Contact:  Good  Speech:  Clear and Coherent and Normal Rate  Volume:  Normal  Mood:  Euthymic  Affect:  Appropriate and Congruent  Thought Process:  Coherent but occasionally disorganized  Orientation:  Full (Time, Place, and Person)  Thought Content:  Tangential  Suicidal Thoughts:  No  Homicidal Thoughts:  No  Memory:  Immediate;   Fair Recent;   Fair  Judgement:  Intact  Insight:  Present  Psychomotor Activity:  Normal  Concentration:  Concentration: Fair and Attention Span: Fair  Recall:  [employer] of Knowledge:Fair  Language: Good  Akathisia:  Negative  Handed:  Right  AIMS (if indicated):  done score=0 No cogwheeling or Rigidity present  Assets:  Communication Skills Desire for Improvement Housing Physical Health Resilience Social Support  ADL's:  Intact  Cognition: WNL  Sleep:  Good   Screenings: AIMS    Flowsheet Row Admission (Discharged) from 05/09/2021  in [place] [facility]  AIMS Total Score 0  AUDIT    Flowsheet Row Admission (Discharged) from 05/09/2021 in [place] [facility]  Alcohol Use Disorder Identification Test Final Score (AUDIT) 5      PHQ2-9    [place] Office Visit from 07/07/2021 in [place]  PHQ-2 Total Score 1  PHQ-9 Total Score 8      Flowsheet Row ED from 07/01/2021 in [place] DEPT Admission (Discharged) from 05/09/2021 in [place] [facility] ED from 05/08/2021 in [place] No Risk No Risk No Risk       Assessment and Plan:  I will continue her medications as she has been doing well on them.  I will order Haldol Dec today so she can receive it.  I will see her back in about 4 weeks.   Schizoaffective Disorder: -Received Haldol Dec 100 mg today next due 12/31 -Continue Zyprexa 15 mg QHS -Continue Depakote ER 1000 mg QHS -Continue Doxepin 100 mg QHS   Briant Cedar, MD 12/1/20224:09 PM

## 2021-10-01 NOTE — Progress Notes (Signed)
Sent to shot clinic from downstairs. this writer was not prepared for her and she doesn't have an active order in the chart for a shot. She also did not bring her injection because she was not told to do so and an order was not put in for her to do so. She has medicaid. She needed to see a provider in outpatient for me to give her an injection and she was worked in with Dr Kai Levins for her initial assessment and for any needed orders to be put in. Dr submitted order for her to have Haldol D 100 mg IM today.She got her first shot from this clinic in her R GLUTEAL muscle and she said it made her feel better instantly. She is pleasant, nicely dressed, nice manners and appropriate. She has a legal guardian, her daughter but she did not come with her, she came via medicaid transport. She is to return in one month for her next injection. No complaints voiced. She also has an appt in Jan with a therapist.

## 2021-10-02 DIAGNOSIS — H524 Presbyopia: Secondary | ICD-10-CM | POA: Diagnosis not present

## 2021-10-09 ENCOUNTER — Telehealth (HOSPITAL_COMMUNITY): Payer: Self-pay | Admitting: Student in an Organized Health Care Education/Training Program

## 2021-10-09 NOTE — Telephone Encounter (Signed)
Pt calling to ask when the injection prescription will be sent to the [facility] on [facility] for her appointment on 10/29/21 shot clinic.

## 2021-10-28 ENCOUNTER — Ambulatory Visit: Payer: Self-pay | Admitting: Family Medicine

## 2021-10-29 ENCOUNTER — Ambulatory Visit (HOSPITAL_COMMUNITY): Payer: Medicaid Other

## 2021-11-11 ENCOUNTER — Ambulatory Visit (INDEPENDENT_AMBULATORY_CARE_PROVIDER_SITE_OTHER): Payer: Medicaid Other | Admitting: Family Medicine

## 2021-11-11 ENCOUNTER — Encounter: Payer: Self-pay | Admitting: Family Medicine

## 2021-11-11 ENCOUNTER — Ambulatory Visit (INDEPENDENT_AMBULATORY_CARE_PROVIDER_SITE_OTHER): Payer: Medicaid Other

## 2021-11-11 ENCOUNTER — Other Ambulatory Visit: Payer: Self-pay

## 2021-11-11 ENCOUNTER — Other Ambulatory Visit (HOSPITAL_COMMUNITY): Payer: Self-pay

## 2021-11-11 VITALS — BP 153/107 | HR 78 | Ht 62.0 in | Wt 172.8 lb

## 2021-11-11 DIAGNOSIS — Z23 Encounter for immunization: Secondary | ICD-10-CM | POA: Diagnosis not present

## 2021-11-11 DIAGNOSIS — Z720 Tobacco use: Secondary | ICD-10-CM

## 2021-11-11 DIAGNOSIS — I10 Essential (primary) hypertension: Secondary | ICD-10-CM | POA: Insufficient documentation

## 2021-11-11 DIAGNOSIS — R635 Abnormal weight gain: Secondary | ICD-10-CM | POA: Diagnosis not present

## 2021-11-11 DIAGNOSIS — Z1211 Encounter for screening for malignant neoplasm of colon: Secondary | ICD-10-CM | POA: Diagnosis not present

## 2021-11-11 DIAGNOSIS — Z0000 Encounter for general adult medical examination without abnormal findings: Secondary | ICD-10-CM | POA: Insufficient documentation

## 2021-11-11 DIAGNOSIS — Z716 Tobacco abuse counseling: Secondary | ICD-10-CM

## 2021-11-11 DIAGNOSIS — Z7689 Persons encountering health services in other specified circumstances: Secondary | ICD-10-CM | POA: Diagnosis not present

## 2021-11-11 HISTORY — DX: Abnormal weight gain: R63.5

## 2021-11-11 HISTORY — DX: Encounter for general adult medical examination without abnormal findings: Z00.00

## 2021-11-11 NOTE — Patient Instructions (Addendum)
It was so great seeing you today! Today we discussed the following:  - Tobacco cessation: I have provided you with a prescription that you can take to the [facility] to get filled for the patches. You can also call 1800-Quitnow and they will also provide prescriptions as well.  - Your blood pressure was a bit elevated today, I want you to try and get a blood pressure cuff and check it at least once every day if you are able and keep a record and bring them back with you.   - Make an appointment in the next month or so to discuss your weight gain.  - Below are the listed recommendations for your healthcare maintenance. A referral has been placed for your colonoscopy, you have been given the information to get your mammogram, and we need to schedule your pap smear when you would like.  Health Maintenance Due  Topic Date Due   Hepatitis C Screening  Never done   PAP SMEAR-Modifier  10/06/2007   COLONOSCOPY (Pts 45-46yrs Insurance coverage will need to be confirmed)  Never done   MAMMOGRAM  Never done   Zoster Vaccines- Shingrix (1 of 2) Never done   INFLUENZA VACCINE  Never done     Please make sure to bring any medications you take to your appointments. If you have any questions or concerns please call the office at [phone].

## 2021-11-11 NOTE — Assessment & Plan Note (Signed)
Patient counseled on discontinuing and would like to proceed with nicotine patches. [facility] based prescription faxed to [facility] for 14mg  patches. Patient also given information for 1-800-Quit-Now.

## 2021-11-11 NOTE — Progress Notes (Signed)
° ° °  SUBJECTIVE:   CHIEF COMPLAINT / HPI:   BP check Patient presents to check in on her blood pressure. She does not have a cuff at home to check, but believes that she would be able to get one around Friday. She is not have any associated symptoms including headache, vision changes, dizziness.     Tobacco cessation Patient would like to proceed with tobacco cessation. She thinks that she would like to try the patch. She currently smokes about 1-2 cigars per day (based on the type, estimated 14mg  for dosing).   Weight gain Patient notes that she has been persistently gaining weight since her visits last year. She feels that it is partially related to her attempts to decrease her nicotine intake and has increased her snacking habit as a result.   PERTINENT  PMH / PSH: Reviewed  OBJECTIVE:   BP (!) 153/107    Pulse 78    Ht 5\' 2"  (1.575 m)    Wt 172 lb 12.8 oz (78.4 kg)    SpO2 98%    BMI 31.61 kg/m   Gen: well-appearing, NAD CV: RRR, no m/r/g appreciated, no peripheral edema Pulm: CTAB, no wheezes/crackles GI: soft, non-tender, non-distended  ASSESSMENT/PLAN:   Healthcare maintenance Discussed healthcare maintenance items with patient and written in AVS.  - Patient to schedule appt for pap smear - Colonoscopy referral placed - Mammogram information given to patient   Tobacco abuse Patient counseled on discontinuing and would like to proceed with nicotine patches. [facility] based prescription faxed to [facility] for 14mg  patches. Patient also given information for 1-800-Quit-Now.  Elevated blood pressure reading with diagnosis of hypertension BP in office: 143/107>153/107. Patient counseled to get BP cuff that goes on the upper arm and instructed on how to correctly check BP. Patient to keep a log and follow-up in 2 weeks. If persistently elevated, will likely initiate medication treatment and obtain labs.   Weight gain Unable to full address this visit. Per chart  review, in Sept 2022 patient weighed around 147lbs and is currently 172lbs. Some contribution from increased food intake secondary to decreasing smoking. Instructed patient to follow-up in 2-3 weeks to further discuss.     Rise Patience, [place]

## 2021-11-11 NOTE — Assessment & Plan Note (Signed)
Unable to full address this visit. Per chart review, in Sept 2022 patient weighed around 147lbs and is currently 172lbs. Some contribution from increased food intake secondary to decreasing smoking. Instructed patient to follow-up in 2-3 weeks to further discuss.

## 2021-11-11 NOTE — Assessment & Plan Note (Signed)
BP in office: 143/107>153/107. Patient counseled to get BP cuff that goes on the upper arm and instructed on how to correctly check BP. Patient to keep a log and follow-up in 2 weeks. If persistently elevated, will likely initiate medication treatment and obtain labs.

## 2021-11-11 NOTE — Assessment & Plan Note (Signed)
Discussed healthcare maintenance items with patient and written in AVS.  - Patient to schedule appt for pap smear - Colonoscopy referral placed - Mammogram information given to patient

## 2021-11-12 ENCOUNTER — Other Ambulatory Visit (HOSPITAL_COMMUNITY): Payer: Self-pay

## 2021-11-12 ENCOUNTER — Other Ambulatory Visit: Payer: Self-pay | Admitting: Family Medicine

## 2021-11-12 DIAGNOSIS — Z1231 Encounter for screening mammogram for malignant neoplasm of breast: Secondary | ICD-10-CM

## 2021-11-16 ENCOUNTER — Other Ambulatory Visit (HOSPITAL_COMMUNITY): Payer: Self-pay

## 2021-11-16 MED ORDER — NICOTINE 14 MG/24HR TD PT24
14.0000 mg | MEDICATED_PATCH | Freq: Every morning | TRANSDERMAL | 3 refills | Status: DC
  Filled 2021-11-16 – 2021-12-10 (×2): qty 14, 14d supply, fill #0
  Filled 2022-01-05: qty 14, 14d supply, fill #1

## 2021-11-17 ENCOUNTER — Other Ambulatory Visit (HOSPITAL_COMMUNITY): Payer: Self-pay | Admitting: *Deleted

## 2021-11-17 ENCOUNTER — Other Ambulatory Visit (HOSPITAL_COMMUNITY): Payer: Self-pay

## 2021-11-17 ENCOUNTER — Ambulatory Visit (INDEPENDENT_AMBULATORY_CARE_PROVIDER_SITE_OTHER): Payer: Medicaid Other | Admitting: *Deleted

## 2021-11-17 ENCOUNTER — Other Ambulatory Visit: Payer: Self-pay

## 2021-11-17 ENCOUNTER — Ambulatory Visit (INDEPENDENT_AMBULATORY_CARE_PROVIDER_SITE_OTHER): Payer: Medicaid Other | Admitting: Clinical

## 2021-11-17 ENCOUNTER — Encounter (HOSPITAL_COMMUNITY): Payer: Self-pay

## 2021-11-17 VITALS — BP 169/105 | HR 67 | Ht 62.0 in | Wt 174.0 lb

## 2021-11-17 DIAGNOSIS — F29 Unspecified psychosis not due to a substance or known physiological condition: Secondary | ICD-10-CM

## 2021-11-17 DIAGNOSIS — Z7689 Persons encountering health services in other specified circumstances: Secondary | ICD-10-CM | POA: Diagnosis not present

## 2021-11-17 MED ORDER — OLANZAPINE 15 MG PO TBDP
15.0000 mg | ORAL_TABLET | Freq: Every day | ORAL | 1 refills | Status: DC
  Filled 2021-11-17 – 2021-12-10 (×2): qty 30, 30d supply, fill #0

## 2021-11-17 MED ORDER — DOXEPIN HCL 100 MG PO CAPS
100.0000 mg | ORAL_CAPSULE | Freq: Every day | ORAL | 1 refills | Status: DC
  Filled 2021-11-17 – 2021-12-10 (×2): qty 30, 30d supply, fill #0
  Filled 2022-01-05: qty 30, 30d supply, fill #1

## 2021-11-17 MED ORDER — HALOPERIDOL DECANOATE 100 MG/ML IM SOLN
100.0000 mg | INTRAMUSCULAR | Status: AC
Start: 2021-11-17 — End: 2022-10-13
  Administered 2021-11-17 – 2022-09-02 (×6): 100 mg via INTRAMUSCULAR

## 2021-11-17 MED ORDER — DIVALPROEX SODIUM ER 500 MG PO TB24
1000.0000 mg | ORAL_TABLET | Freq: Every day | ORAL | 0 refills | Status: DC
  Filled 2021-11-17 – 2021-12-10 (×2): qty 60, 30d supply, fill #0

## 2021-11-17 MED ORDER — BENZTROPINE MESYLATE 0.5 MG PO TABS
0.5000 mg | ORAL_TABLET | Freq: Four times a day (QID) | ORAL | 0 refills | Status: DC | PRN
  Filled 2021-11-17 – 2021-12-10 (×2): qty 60, 15d supply, fill #0

## 2021-11-17 NOTE — Progress Notes (Signed)
In accompanied by her daughter who is her guardian. Daughter states patient is more irritable, talks about a woman in the house with her, when questioned patient said said it was a neighbor trying to be friends with her and doing it in an unusual way. Daughter also says she barricades the door, again patient gives a reasonable explanation that her daughter doesn't knock and she likes her privacy. Discussed her oral meds and wrote her sigs down for her. Will follow  up with dr for new rxs to be called in for her. She is to return in one month and also on the same day be seen by the provider as they have questions and daughter thinks she needs more meds. Patients only concern is her poor sleep. She got her shot today in her L DELTOID without issue.

## 2021-11-18 ENCOUNTER — Other Ambulatory Visit (HOSPITAL_COMMUNITY): Payer: Self-pay

## 2021-11-18 NOTE — Plan of Care (Signed)
Client was in agreement with the treatment plan. °

## 2021-11-18 NOTE — Progress Notes (Signed)
° °  THERAPIST PROGRESS NOTE  Session Time: 45 minutes  Participation Level: Active  Behavioral Response: CasualAlertIrritable  Type of Therapy: Individual Therapy  Treatment Goals addressed: Coping  Interventions: CBT and Supportive  Summary:  Anita Hill is a 51 y.o. female who presents for the scheduled session oriented times five, appropriately dressed, and friendly.Client denied hallucinations and delusions.Client presents for initial appointment with this therapist. Client presented with her daughter who is also her legal guardian. Client discussed with the therapist her thoughts about presenting for therapy and psychiatrist. Client discussed her frustration with being IVC's over the years unnecessarily. Client reported she was not pleased that her daughter filed for legal guardianship against her desire to be independent. Client reported she has been living with her daughter and daughters fiance'. Client reported she does not like the friends her daughter are associated with who have helped her daughter through the guardianship progress because they are "fraudulent". Client reported she and her daughter get into arguments that escalate quickly. Client reported she wants her daughter to receive counseling for her to work on better skills to communicate with one another. Client reported she does not like being co dependent. Client discussed her disagreement with her schizophrenia diagnosis and referred to her medication as "animal pills". Clients daughter reported she took her mother in because she wanted to care for her. Client reported her mother was homeless and others were sending her videos of her mother sleeping outside. Client reported she has not been homeless. Client daughter reported the client has gained weight, improved her appearance and has seen some progress from medication although it may need to be adjusted. Client daughter reported she wants to find her mother services such  as a day program and a career skills program for example. Client reported she is also interested in furthering her education. Client reported she also does not agree with how her daughter allots her money each month. Client reported she previously went to [facility] for residential tx from alcohol use but was released because they said she was too much for them to handle with her mental health.     Suicidal/Homicidal: Nowithout intent/plan  Therapist Response:  Therapist began the appointment making introductions and discussing confidentiality with client and her daughter. Therapist used CBT to engage with the client about how she has been doing since discharge from [facility]. Therapist used CBT to engage and ask the client about her receptiveness for outpatient services. Therapist used CBT to engage with the client and her daughter about mental health history. Therapist used CBT to engage and ask the client about medication compliance compared to ongoing symptoms. Therapist used CBT to complete the treatment plan. Therapist addressed questions and concerns. Client was scheduled for follow up appointment.    Plan: Return again in 5 weeks.  Diagnosis: Schizophrenia spectrum disorder with psychotic disorder type not yet determined    Birdena Jubilee Taevon Aschoff, LCSW 11/17/2021

## 2021-11-19 ENCOUNTER — Other Ambulatory Visit (HOSPITAL_COMMUNITY): Payer: Self-pay

## 2021-11-19 DIAGNOSIS — Z7689 Persons encountering health services in other specified circumstances: Secondary | ICD-10-CM | POA: Diagnosis not present

## 2021-11-24 ENCOUNTER — Other Ambulatory Visit (HOSPITAL_COMMUNITY): Payer: Self-pay

## 2021-11-25 ENCOUNTER — Other Ambulatory Visit (HOSPITAL_COMMUNITY): Payer: Self-pay

## 2021-12-03 ENCOUNTER — Other Ambulatory Visit: Payer: Self-pay

## 2021-12-03 ENCOUNTER — Ambulatory Visit
Admission: RE | Admit: 2021-12-03 | Discharge: 2021-12-03 | Disposition: A | Discharge status: Home / Self Care | Payer: Medicaid Other | Source: Ambulatory Visit | Attending: Family Medicine | Admitting: Family Medicine

## 2021-12-03 DIAGNOSIS — Z7689 Persons encountering health services in other specified circumstances: Secondary | ICD-10-CM | POA: Diagnosis not present

## 2021-12-03 DIAGNOSIS — Z1231 Encounter for screening mammogram for malignant neoplasm of breast: Secondary | ICD-10-CM | POA: Diagnosis not present

## 2021-12-10 ENCOUNTER — Other Ambulatory Visit (HOSPITAL_COMMUNITY): Payer: Self-pay

## 2021-12-11 ENCOUNTER — Other Ambulatory Visit (HOSPITAL_COMMUNITY): Payer: Self-pay

## 2021-12-15 ENCOUNTER — Ambulatory Visit (HOSPITAL_COMMUNITY): Payer: Medicaid Other

## 2021-12-21 ENCOUNTER — Other Ambulatory Visit (HOSPITAL_COMMUNITY): Payer: Self-pay

## 2021-12-24 ENCOUNTER — Other Ambulatory Visit: Payer: Self-pay

## 2021-12-24 ENCOUNTER — Encounter (HOSPITAL_COMMUNITY): Payer: Self-pay

## 2021-12-24 ENCOUNTER — Ambulatory Visit (INDEPENDENT_AMBULATORY_CARE_PROVIDER_SITE_OTHER): Payer: Medicaid Other | Admitting: *Deleted

## 2021-12-24 VITALS — BP 143/90 | HR 109 | Ht 62.0 in | Wt 177.0 lb

## 2021-12-24 DIAGNOSIS — F29 Unspecified psychosis not due to a substance or known physiological condition: Secondary | ICD-10-CM

## 2021-12-24 NOTE — Progress Notes (Signed)
Patient arrived for haloperidol decanoate (HALDOL DECANOATE) 100 MG/ML injection 100 mg  --  Patient tolerated injection well in Right-Arm [place] as Always

## 2021-12-28 ENCOUNTER — Telehealth: Payer: Self-pay

## 2021-12-28 NOTE — Telephone Encounter (Signed)
Patient calls nurse line requesting an apt with PCP to discuss driving.  Patient reports there is a medical block on her license. Unsure if patient is able to drive with restrictions at this time we got disconnected.   Patient scheduled for 3/4 to discuss with PCP. Patient advised to bring all information with her from [facility].

## 2022-01-01 ENCOUNTER — Other Ambulatory Visit (HOSPITAL_COMMUNITY)
Admission: RE | Admit: 2022-01-01 | Discharge: 2022-01-01 | Disposition: A | Discharge status: Home / Self Care | Payer: Medicaid Other | Source: Ambulatory Visit | Attending: Family Medicine | Admitting: Family Medicine

## 2022-01-01 ENCOUNTER — Ambulatory Visit (INDEPENDENT_AMBULATORY_CARE_PROVIDER_SITE_OTHER): Payer: Medicaid Other | Admitting: Family Medicine

## 2022-01-01 ENCOUNTER — Encounter: Payer: Self-pay | Admitting: Family Medicine

## 2022-01-01 ENCOUNTER — Other Ambulatory Visit: Payer: Self-pay

## 2022-01-01 VITALS — BP 161/111 | HR 79 | Wt 181.8 lb

## 2022-01-01 DIAGNOSIS — Z124 Encounter for screening for malignant neoplasm of cervix: Secondary | ICD-10-CM | POA: Insufficient documentation

## 2022-01-01 DIAGNOSIS — F29 Unspecified psychosis not due to a substance or known physiological condition: Secondary | ICD-10-CM

## 2022-01-01 DIAGNOSIS — Z202 Contact with and (suspected) exposure to infections with a predominantly sexual mode of transmission: Secondary | ICD-10-CM | POA: Diagnosis not present

## 2022-01-01 NOTE — Progress Notes (Addendum)
? ? ?  SUBJECTIVE:  ? ?CHIEF COMPLAINT / HPI:  ? ?Patient reports that there is a medical block on her license and that she is wanting to try and get that removed. She thinks that her last lawyer was influencing the judge to make the decision against her and does not want to use that lawyer again. ? ?Patient also reports that she is in a relationship and is wanting to get STD tested. ? ?PERTINENT  PMH / PSH: Reviewed ? ?OBJECTIVE:  ? ?BP (!) 161/111   Pulse 79   Wt 181 lb 12.8 oz (82.5 kg)   SpO2 100%   BMI 33.25 kg/m?   ?General: NAD, well-appearing, well-nourished ?Respiratory: No respiratory distress, breathing comfortably, able to speak in full sentences ?Skin: warm and dry, no rashes noted on exposed skin ?Psych: Appropriate affect and mood ?Pelvic exam: VULVA: normal appearing vulva with no masses, tenderness or lesions, VAGINA: normal appearing vagina with normal color and discharge, no lesions, CERVIX: no discharge noted, mild telangectasia appearance on the cervix. Chaperoned by Erskine Emery, CMA ? ?ASSESSMENT/PLAN:  ? ?Possible STD exposure ?Patient is not having any discharge or other symptoms so we will test with the swab from Pap smear. ?- Follow-up gonorrhea/chlamydia ?- Follow-up trichomoniasis testing ?- HIV and RPR ordered ? ?Competency with Schizophrenia ?Patient presented paperwork that showed indefinite suspension of license after competency hearing in the court in 2022.  Patient was hospitalized at that time and is wanting to have this reevaluated.  Gave patient information for free legal aid in the [place] area and instructed patient that she will likely need to discuss this with psychiatry. ? ?Healthcare maintenance ?- Pap smear completed today ?- Continue conversation of colonoscopy ? ?Thiago Ragsdale, DO ?[place]  ?

## 2022-01-01 NOTE — Patient Instructions (Addendum)
For the issue with your license, I would recommend contacting a lawyer.  Below is the information to contact free legal aid for [facility].  If they determined that it is something that is medical, it may end up being her psychiatrist that may have to help with this. ? ?[address] [place], [place] [address] [place] ?TOLL FREE: [phone] ?LOCAL: [phone] ?FAX: [phone] ?COUNTIES SERVED: [place], [place], [place], [place] ?MANAGING ATTORNEY: Marlowe Alt ? ? ?We will call you with results for your STD testing if there is anything we need to treat. ?

## 2022-01-05 ENCOUNTER — Other Ambulatory Visit (HOSPITAL_COMMUNITY): Payer: Self-pay | Admitting: Student in an Organized Health Care Education/Training Program

## 2022-01-05 ENCOUNTER — Other Ambulatory Visit (HOSPITAL_COMMUNITY): Payer: Self-pay

## 2022-01-05 DIAGNOSIS — F29 Unspecified psychosis not due to a substance or known physiological condition: Secondary | ICD-10-CM

## 2022-01-05 LAB — CYTOLOGY - PAP
Chlamydia: NEGATIVE
Comment: NEGATIVE
Comment: NEGATIVE
Comment: NEGATIVE
Comment: NORMAL
Diagnosis: NEGATIVE
Diagnosis: REACTIVE
High risk HPV: NEGATIVE
Neisseria Gonorrhea: NEGATIVE
Trichomonas: NEGATIVE

## 2022-01-06 ENCOUNTER — Encounter: Payer: Self-pay | Admitting: Family Medicine

## 2022-01-06 ENCOUNTER — Ambulatory Visit: Payer: Medicaid Other | Admitting: Family Medicine

## 2022-01-06 ENCOUNTER — Other Ambulatory Visit (HOSPITAL_COMMUNITY): Payer: Self-pay

## 2022-01-07 ENCOUNTER — Other Ambulatory Visit (HOSPITAL_COMMUNITY): Payer: Self-pay

## 2022-01-07 ENCOUNTER — Other Ambulatory Visit (HOSPITAL_COMMUNITY): Payer: Self-pay | Admitting: Student in an Organized Health Care Education/Training Program

## 2022-01-07 DIAGNOSIS — F29 Unspecified psychosis not due to a substance or known physiological condition: Secondary | ICD-10-CM

## 2022-01-08 ENCOUNTER — Other Ambulatory Visit (HOSPITAL_COMMUNITY): Payer: Self-pay

## 2022-01-11 ENCOUNTER — Other Ambulatory Visit (HOSPITAL_COMMUNITY): Payer: Self-pay

## 2022-01-11 MED ORDER — DIVALPROEX SODIUM ER 500 MG PO TB24
1000.0000 mg | ORAL_TABLET | Freq: Every day | ORAL | 0 refills | Status: DC
  Filled 2022-01-11: qty 60, 30d supply, fill #0

## 2022-01-11 MED ORDER — BENZTROPINE MESYLATE 0.5 MG PO TABS
0.5000 mg | ORAL_TABLET | Freq: Four times a day (QID) | ORAL | 0 refills | Status: DC | PRN
  Filled 2022-01-11: qty 60, 15d supply, fill #0

## 2022-01-11 NOTE — Telephone Encounter (Signed)
Received request for refills of her Depakote and Cogentin.  Refills sent in. ? ? ?Sent: ?Cogentin 0.5 mg q6 PRN: 60 tablets with 0 refills ?Depakote ER 1000 mg QHS: 60 (500 mg) tablets with 0 refills ? ? ? ?Fatima Sanger MD ?[facility] ? ?

## 2022-01-14 ENCOUNTER — Telehealth: Payer: Self-pay | Admitting: Family Medicine
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
## 2022-01-18 ENCOUNTER — Ambulatory Visit (INDEPENDENT_AMBULATORY_CARE_PROVIDER_SITE_OTHER): Payer: Medicaid Other | Admitting: Family Medicine

## 2022-01-18 ENCOUNTER — Telehealth: Payer: Self-pay | Admitting: Family Medicine

## 2022-01-18 ENCOUNTER — Other Ambulatory Visit: Payer: Self-pay

## 2022-01-18 ENCOUNTER — Encounter: Payer: Self-pay | Admitting: Family Medicine

## 2022-01-18 VITALS — BP 136/90 | HR 84 | Ht 62.0 in | Wt 184.4 lb

## 2022-01-18 DIAGNOSIS — I10 Essential (primary) hypertension: Secondary | ICD-10-CM | POA: Diagnosis present

## 2022-01-18 DIAGNOSIS — F29 Unspecified psychosis not due to a substance or known physiological condition: Secondary | ICD-10-CM | POA: Diagnosis not present
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
## 2022-01-18 NOTE — Assessment & Plan Note (Signed)
BP improved today at 136/90.  Discussed with patient need to get BP cuff and take measurements at least once daily at home. ?- Follow-up in the next 2 to 3 weeks with BP log ?
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
## 2022-01-18 NOTE — Patient Instructions (Signed)
Your blood pressure looks a lot better today.  I want you to get a blood pressure cuff from [employer] if you are able to and try to check your blood pressures at least once a day.  Keep a log of these numbers for me and lets check back in in about 2 weeks or so.  If the measurements are consistently > 140/90, we will likely need to go ahead and start a new medication to help bring his blood pressures down. ? ?Keep up the good work with your diet and exercise, we always recommend at least 20 minutes of cardio (walking/jogging) 4-5 times per week. ? ?I will call your daughter to touch base with her about the forms. ?
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
## 2022-01-19 ENCOUNTER — Other Ambulatory Visit (HOSPITAL_COMMUNITY): Payer: Self-pay

## 2022-01-20 ENCOUNTER — Telehealth: Payer: Self-pay | Admitting: Family Medicine

## 2022-01-20 NOTE — Telephone Encounter (Signed)
Doctor gave me forms to print out the problem and medication list, I have attached them with the forms and placed in RN's box.  ? ?Thanks! ?
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
## 2022-01-20 NOTE — Telephone Encounter (Signed)
Forms filled out and given to front office staff to print medication and problem list to finish form. Daughter to be called once completed.  ? ? ?Tate Zagal, DO  ?

## 2022-01-21 ENCOUNTER — Ambulatory Visit (HOSPITAL_COMMUNITY): Payer: Medicaid Other

## 2022-01-21 ENCOUNTER — Ambulatory Visit (HOSPITAL_COMMUNITY): Payer: Medicaid Other | Admitting: Clinical

## 2022-01-21 NOTE — Telephone Encounter (Signed)
Copy made and form placed up front for pick up.  ? ?Guardian is aware.  ?

## 2022-01-25 ENCOUNTER — Other Ambulatory Visit: Payer: Self-pay

## 2022-01-25 ENCOUNTER — Ambulatory Visit (INDEPENDENT_AMBULATORY_CARE_PROVIDER_SITE_OTHER): Payer: Medicaid Other | Admitting: Podiatrist

## 2022-01-25 ENCOUNTER — Encounter: Payer: Self-pay | Admitting: Podiatrist

## 2022-01-25 DIAGNOSIS — B351 Tinea unguium: Secondary | ICD-10-CM

## 2022-01-25 DIAGNOSIS — G629 Polyneuropathy, unspecified: Secondary | ICD-10-CM

## 2022-01-25 DIAGNOSIS — M79609 Pain in unspecified limb: Secondary | ICD-10-CM

## 2022-01-25 DIAGNOSIS — L602 Onychogryphosis: Secondary | ICD-10-CM

## 2022-01-25 NOTE — Patient Instructions (Signed)

## 2022-01-25 NOTE — Progress Notes (Signed)
?Chief Complaint  ?Patient presents with  ? Nail Problem  ?  Nail trim   ?  ? ?HPI: Patient is 51 y.o. female who preseants today for a foot check and for long and thickened toenails which are difficult to trim.  She relates numbness in her toes and pain in her shoes when they grow thick.  ? ?Patient Active Problem List  ? Diagnosis Date Noted  ? Healthcare maintenance 11/11/2021  ? Weight gain 11/11/2021  ? Elevated blood pressure reading with diagnosis of hypertension 11/11/2021  ? Delusions ([place]) 05/10/2021  ? Tobacco abuse 07/06/2012  ? Schizophrenia spectrum disorder with psychotic disorder type not yet determined ([place])   ? Congestive heart failure ([place])   ? Bipolar I disorder ([place]) 12/29/2006  ? BACK PAIN, LOW 12/29/2006  ? ? ?Current Outpatient Medications on File Prior to Visit  ?Medication Sig Dispense Refill  ? benztropine (COGENTIN) 0.5 MG tablet Take 1 tablet (0.5 mg total) by mouth every 6 (six) hours as needed for tremors (muscle stiffness). 60 tablet 0  ? divalproex (DEPAKOTE ER) 500 MG 24 hr tablet Take 2 tablets (1,000 mg total) by mouth daily at 10 pm. 60 tablet 0  ? doxepin (SINEQUAN) 100 MG capsule Take 1 capsule (100 mg total) by mouth at bedtime. 30 capsule 1  ? haloperidol decanoate (HALDOL DECANOATE) 100 MG/ML injection Inject 1 mL (100 mg total) into the muscle every 30 (thirty) days. 1 mL 2  ? meloxicam (MOBIC) 15 MG tablet Take 0.5-1 tablets (7.5-15 mg total) by mouth daily as needed for pain. 30 tablet 6  ? metoprolol succinate (TOPROL-XL) 25 MG 24 hr tablet TAKE 1 TABLET(25 MG) BY MOUTH DAILY 30 tablet 3  ? nicotine (NICODERM CQ - DOSED IN MG/24 HOURS) 14 mg/24hr patch Place 1 patch (14 mg total) onto the skin every morning. 14 patch 3  ? OLANZapine zydis (ZYPREXA) 15 MG disintegrating tablet Take 1 tablet (15 mg total) by mouth at bedtime. 30 tablet 1  ? [DISCONTINUED] potassium chloride SA (K-DUR,KLOR-CON) 20 MEQ tablet Take 1 tablet (20 mEq total) by mouth 2 (two) times daily.  (Patient not taking: Reported on 12/11/2018) 30 tablet 11  ? ?Current Facility-Administered Medications on File Prior to Visit  ?Medication Dose Route Frequency Provider Last Rate Last Admin  ? haloperidol decanoate (HALDOL DECANOATE) 100 MG/ML injection 100 mg  100 mg Intramuscular Q30 days Lucky Rathke, FNP   100 mg at 12/24/21 1537  ? ? ?Allergies  ?Allergen Reactions  ? Strawberry Extract Anaphylaxis  ? Risperidone And Related Hives, Nausea Only and Other (See Comments)  ?  Made me feel jittery and restless/ Pt denies this allergy on 01/27/21  ? Anette Guarneri [Lurasidone Hcl] Other (See Comments)  ?  Hot and cold flashes  ? ? ?Review of Systems ?No fevers, chills, nausea, muscle aches, no difficulty breathing, no calf pain, no chest pain or shortness of breath. ? ? ?Physical Exam ? ?GENERAL APPEARANCE: Alert, conversant. Appropriately groomed. No acute distress.  ? ?VASCULAR: Pedal pulses palpable DP and PT bilateral.  Capillary refill time is immediate to all digits,  Proximal to distal cooling it warm to warm.  Digital perfusion adequate.  ? ?NEUROLOGIC: sensation is intact to 5.07 monofilament at 5/5 sites bilateral.  Light touch is intact bilateral, vibratory sensation intact bilateral ? ?MUSCULOSKELETAL: acceptable muscle strength, tone and stability bilateral.  No gross boney pedal deformities noted.  No pain, crepitus or limitation noted with foot and ankle range of motion  bilateral.  ? ?DERMATOLOGIC: skin is warm, supple, and dry.  Color, texture, and turgor of skin within normal limits.  No open wounds are noted.  No preulcerative lesions are seen.  Assessment  ? ?  ICD-10-CM   ?1. Pain due to onychomycosis of nail  B35.1   ? M79.609   ?  ?2. Long toenail  L60.2   ?  ? ? ? ?Plan ? ?Discussed exam findings with the patient.  Recommended a nail debridement.  This was carried out today with sterile nail nippers and a power burr without complication.  Periodic routine nail debridement recommended every 3 months or  as needed for follow-up.  ? ? ?

## 2022-02-02 ENCOUNTER — Ambulatory Visit (INDEPENDENT_AMBULATORY_CARE_PROVIDER_SITE_OTHER): Payer: Medicaid Other | Admitting: Family Medicine

## 2022-02-02 ENCOUNTER — Encounter: Payer: Self-pay | Admitting: Family Medicine

## 2022-02-02 VITALS — BP 121/83 | HR 93 | Wt 181.8 lb

## 2022-02-02 DIAGNOSIS — F29 Unspecified psychosis not due to a substance or known physiological condition: Secondary | ICD-10-CM | POA: Diagnosis not present

## 2022-02-02 DIAGNOSIS — M79604 Pain in right leg: Secondary | ICD-10-CM | POA: Diagnosis not present

## 2022-02-02 DIAGNOSIS — I10 Essential (primary) hypertension: Secondary | ICD-10-CM

## 2022-02-02 DIAGNOSIS — Z0000 Encounter for general adult medical examination without abnormal findings: Secondary | ICD-10-CM | POA: Diagnosis not present

## 2022-02-02 NOTE — Progress Notes (Signed)
? ? ?  SUBJECTIVE:  ? ?CHIEF COMPLAINT / HPI:  ? ?Patient's daughter present for this visit. ? ?Elevated blood pressures ?BP log was not completed, they are going to go get a blood pressure cuff this weekend to monitor blood pressures.  Overall feels like it has been okay. ? ?Living situation ?Daughter is pursuing group home, patient initially concerned because she wanted to go to an ALF not a group home.  Patient has not yet been to a group home and has not seen one either, but she is concerned and worried because I think she has seen in the media and in movies about them. ? ?Right anterior shin pain ?Patient reports that she is having a muscle spasm in the middle of the night that feels like a charley horse but is in the front of her shin.  Nothing has been helping it, she does note that stretching the area out makes her little bit as it feels tight. ? ? ?PERTINENT  PMH / PSH: Reviewed ? ?OBJECTIVE:  ? ?BP 121/83   Pulse 93   Wt 181 lb 12.8 oz (82.5 kg)   SpO2 100%   BMI 33.25 kg/m?   ?General: NAD, well-appearing, well-nourished ?Respiratory: No respiratory distress, breathing comfortably, able to speak in full sentences ?Skin: warm and dry, no rashes noted on exposed skin ?Psych: Appropriate affect and mood ?Extremities: BLE without edema and non-tender to palpation, no abnormality on examination ? ?ASSESSMENT/PLAN:  ? ?Elevated blood pressure reading with diagnosis of hypertension ?BP well controlled today at 121/83.  Patient has not been checking her blood pressures at home. ?- Recommend getting blood pressure cuff ?- Goal of 130/80 for blood pressure ?- Patient to follow-up if persistently elevated above goal ? ?Healthcare maintenance ?Has not yet completed colonoscopy, per referral there have been voicemails left a scheduled appointment. ?- Gave patient the phone number for the GI to call ?  ?Right anterior shin pain ?Appears to be more MSK related, do not feel related to vascular disease at this time.   Pain is worsened when laying down to sleep at night and sometimes cramps.  We will trial conservative management before obtaining labs and trying medications. ?- Stretches for the tibialis anterior given to patient ?- Follow-up in the next 4 weeks if no improvement ?- Can continue to do warm baths and Aleve/ibuprofen as needed ? ?Complex social situation ?Patient currently living with daughter.  Previously filled out forms to assist with changing living situation.  Family currently pursuing group home and working with social work this Friday to locate group homes for the patient as a temporary measure. ? ?Tannisha Kennington, DO ?[place]  ?

## 2022-02-02 NOTE — Assessment & Plan Note (Signed)
BP well controlled today at 121/83.  Patient has not been checking her blood pressures at home. ?- Recommend getting blood pressure cuff ?- Goal of 130/80 for blood pressure ?- Patient to follow-up if persistently elevated above goal ?

## 2022-02-02 NOTE — Patient Instructions (Addendum)
It was so great seeing you today! Today we discussed the following: ? ?-Your blood pressure was beautiful today, I still recommend trying to check it at least once or twice a day.  If it is consistently elevated >130/80, then we may need to consider starting medication ? ?-To schedule your colonoscopy try and call this number: [phone] ? ?-For the leg cramps lets start with nonmedication treatments and see how they work.  I recommend doing stretching exercises before going to bed that can loosen up the muscles in your legs. ? ? ?Stand facing the wall, feet together, about 24 inches from the wall. With the heels firmly on the floor and the body aligned straight at the hips and knees, lean forward to the wall, stretching the posterior leg tissues. Hold this position for 10 to 30 seconds. Repeat five times per session, at least two sessions daily.  ? ? ?There are also some stretches shown below that can help with the front of your leg as pictured and you can do these for 15 to 20 seconds about 3-5 times per day. ? ? ? ? ? ? ?

## 2022-02-02 NOTE — Assessment & Plan Note (Signed)
Has not yet completed colonoscopy, per referral there have been voicemails left a scheduled appointment. ?- Gave patient the phone number for the GI to call ?

## 2022-02-09 ENCOUNTER — Ambulatory Visit (INDEPENDENT_AMBULATORY_CARE_PROVIDER_SITE_OTHER): Payer: Medicaid Other | Admitting: *Deleted

## 2022-02-09 ENCOUNTER — Encounter (HOSPITAL_COMMUNITY): Payer: Self-pay

## 2022-02-09 ENCOUNTER — Ambulatory Visit (INDEPENDENT_AMBULATORY_CARE_PROVIDER_SITE_OTHER): Payer: Medicaid Other | Admitting: Clinical

## 2022-02-09 ENCOUNTER — Ambulatory Visit (INDEPENDENT_AMBULATORY_CARE_PROVIDER_SITE_OTHER): Payer: Medicaid Other | Admitting: Family

## 2022-02-09 ENCOUNTER — Encounter (HOSPITAL_COMMUNITY): Payer: Self-pay | Admitting: Family

## 2022-02-09 VITALS — BP 124/84 | HR 75 | Ht 62.0 in

## 2022-02-09 DIAGNOSIS — F29 Unspecified psychosis not due to a substance or known physiological condition: Secondary | ICD-10-CM | POA: Diagnosis not present

## 2022-02-09 DIAGNOSIS — F17219 Nicotine dependence, cigarettes, with unspecified nicotine-induced disorders: Secondary | ICD-10-CM

## 2022-02-09 MED ORDER — NICOTINE 14 MG/24HR TD PT24: 14.0000 mg | MEDICATED_PATCH | Freq: Every morning | TRANSDERMAL | 3 refills | Status: DC

## 2022-02-09 MED ORDER — HALOPERIDOL DECANOATE 100 MG/ML IM SOLN: 100.0000 mg | INTRAMUSCULAR | 3 refills | Status: DC

## 2022-02-09 MED ORDER — DIVALPROEX SODIUM ER 500 MG PO TB24: 1000.0000 mg | ORAL_TABLET | Freq: Every day | ORAL | 1 refills | Status: DC

## 2022-02-09 MED ORDER — BENZTROPINE MESYLATE 0.5 MG PO TABS: 0.5000 mg | ORAL_TABLET | Freq: Four times a day (QID) | ORAL | 3 refills | Status: DC | PRN

## 2022-02-09 MED ORDER — DOXEPIN HCL 100 MG PO CAPS: 100.0000 mg | ORAL_CAPSULE | Freq: Every day | ORAL | 3 refills | Status: DC

## 2022-02-09 NOTE — Progress Notes (Signed)
PATIENT ARRIVED FOR haloperidol decanoate (HALDOL DECANOATE) 100 MG/ML injection 100 mg  WHICH IS OVERDUE  ?100 mg, Intramuscular, Every 30 days   ?TOLERATED INJECTION WELL IN LEFT-ARM  & PLEASANT AS ALWAYS ?

## 2022-02-09 NOTE — Progress Notes (Signed)
BH MD/PA/NP OP Progress Note ? ?02/09/2022 11:52 AM ?Anita Hill  ?MRN:  [id number] ? ?Chief Complaint: No chief complaint on file. ? ?HPI:  ?Anita Hill is reluctant to embrace diagnoses. She reports she has been non compliant with current medications including benztropine and valproic acid, reports plan to remain compliant moving forward. "They keep saying that I have schizophrenia but I don't think so."  ?No auditory or visual hallucinations reported in "years." One episode of brief paranoia. She was outside smoking felt that someone else was in the backyard, almost a year ago. She now smokes cigarettes and cigars in garage. She has recently cut back on tobacco use, requests nicotine patch prescription renewal. She states "I am mostly always in a good mood. I like to read, sometimes I get bored when I have nothing to do." ? ?Recent stressors include sadness that her adult daughter did not host a 50th birthday party for her, she states "but things could be worse. I can always find grattitude and get grateful quickly."  ? ?Resides with daughter in [place] for approx one year. Typically employed in [employer], works temporary jobs, my health won't allow me to work 40 hours per week. Primary care provider monitoring consistently.  ? ?She denies suicidal and homicidal ideations, she contracts verbally for safety with this Probation officer.  ? ?Recent positive events include "My son just turned 10, hoping to stay normal enough to be a part of his life someday.Son removed from my care at age [age] year." ? ?Some conflict with daughter. Daughter frustrated with [facility] missing appointments for mental health follow-up.  ? ?Alcohol 2-3 times per week. Typicaal one 40-ounce beer per episode of alcohol use. Sober from substance for 21 years. Would like to begin to attend AA/NA meetings again. Denies substance use aside from alcohol.  Feels alcohol brings down anxiety.  ? ?Patient offered support and encouragement.   ? ?Visit Diagnosis: No diagnosis found. ? ?Past Psychiatric History: Schizophrenia, bipolar 1 disorder, alcohol use disorder ? ?Past Medical History:  ?Past Medical History:  ?Diagnosis Date  ? BIPOLAR DISORDER   ? Chest pain 01/20/2016  ? Delusions ([place])   ? Dyspnea 10/13/2012  ? Homelessness   ? Left ventricular hypertrophy   ? Palpitations   ? Psychosis ([place])   ? Sickle cell trait ([place])   ? Substance abuse ([place])   ?  ?Past Surgical History:  ?Procedure Laterality Date  ? BUNIONECTOMY    ? ? ?Family Psychiatric History: none reported ? ?Family History:  ?Family History  ?Problem Relation Age of Onset  ? Heart disease Mother   ?     CHF  ? Prostate cancer Father   ? ? ?Social History:  ?Social History  ? ?Socioeconomic History  ? Marital status: Single  ?  Spouse name: Not on file  ? Number of children: 2  ? Years of education: Not on file  ? Highest education level: Not on file  ?Occupational History  ?  Comment: Unemployed  ?Tobacco Use  ? Smoking status: Every Day  ?  Packs/day: 1.00  ?  Years: 10.00  ?  Pack years: 10.00  ?  Types: Cigarettes, Cigars  ? Smokeless tobacco: Never  ?Vaping Use  ? Vaping Use: Never used  ?Substance and Sexual Activity  ? Alcohol use: Yes  ?  Comment: everyday 20-80oz  ? Drug use: No  ?  Comment: Previous cocaine, heroin, marijuana  ? Sexual activity: Not Currently  ?Other Topics Concern  ?  Not on file  ?Social History Narrative  ? Not on file  ? ?Social Determinants of Health  ? ?Financial Resource Strain: Low Risk   ? Difficulty of Paying Living Expenses: Not hard at all  ?Food Insecurity: No Food Insecurity  ? Worried About Charity fundraiser in the Last Year: Never true  ? Ran Out of Food in the Last Year: Never true  ?Transportation Needs: No Transportation Needs  ? Lack of Transportation (Medical): No  ? Lack of Transportation (Non-Medical): No  ?Physical Activity: Not on file  ?Stress: Not on file  ?Social Connections: Not on file  ? ? ?Allergies:  ?Allergies  ?Allergen  Reactions  ? Strawberry Extract Anaphylaxis  ? Risperidone And Related Hives, Nausea Only and Other (See Comments)  ?  Made me feel jittery and restless/ Pt denies this allergy on 01/27/21  ? Anette Guarneri [Lurasidone Hcl] Other (See Comments)  ?  Hot and cold flashes  ? ? ?Metabolic Disorder Labs: ?Lab Results  ?Component Value Date  ? HGBA1C 6.0 (H) 05/08/2021  ? MPG 125.5 05/08/2021  ? ?Lab Results  ?Component Value Date  ? PROLACTIN 2.6 (L) 05/08/2021  ? ?Lab Results  ?Component Value Date  ? CHOL 201 (H) 05/08/2021  ? TRIG 66 05/08/2021  ? HDL 110 05/08/2021  ? CHOLHDL 1.8 05/08/2021  ? VLDL 13 05/08/2021  ? [place] 78 05/08/2021  ? ?Lab Results  ?Component Value Date  ? TSH 3.006 05/08/2021  ? ? ?Therapeutic Level Labs: ?No results found for: LITHIUM ?Lab Results  ?Component Value Date  ? VALPROATE 50 05/16/2021  ? ?No components found for:  CBMZ ? ?Current Medications: ?Current Outpatient Medications  ?Medication Sig Dispense Refill  ? benztropine (COGENTIN) 0.5 MG tablet Take 1 tablet (0.5 mg total) by mouth every 6 (six) hours as needed for tremors (muscle stiffness). 60 tablet 0  ? divalproex (DEPAKOTE ER) 500 MG 24 hr tablet Take 2 tablets (1,000 mg total) by mouth daily at 10 pm. 60 tablet 0  ? doxepin (SINEQUAN) 100 MG capsule Take 1 capsule (100 mg total) by mouth at bedtime. 30 capsule 1  ? haloperidol decanoate (HALDOL DECANOATE) 100 MG/ML injection Inject 1 mL (100 mg total) into the muscle every 30 (thirty) days. 1 mL 2  ? meloxicam (MOBIC) 15 MG tablet Take 0.5-1 tablets (7.5-15 mg total) by mouth daily as needed for pain. 30 tablet 6  ? metoprolol succinate (TOPROL-XL) 25 MG 24 hr tablet TAKE 1 TABLET(25 MG) BY MOUTH DAILY 30 tablet 3  ? nicotine (NICODERM CQ - DOSED IN MG/24 HOURS) 14 mg/24hr patch Place 1 patch (14 mg total) onto the skin every morning. 14 patch 3  ? OLANZapine zydis (ZYPREXA) 15 MG disintegrating tablet Take 1 tablet (15 mg total) by mouth at bedtime. 30 tablet 1  ? ?Current  Facility-Administered Medications  ?Medication Dose Route Frequency Provider Last Rate Last Admin  ? haloperidol decanoate (HALDOL DECANOATE) 100 MG/ML injection 100 mg  100 mg Intramuscular Q30 days Lucky Rathke, FNP   100 mg at 12/24/21 1537  ? ? ? ?Musculoskeletal: ?Strength & Muscle Tone: within normal limits ?Gait & Station: normal ?Patient leans: N/A ? ?Psychiatric Specialty Exam: ?Review of Systems  ?Psychiatric/Behavioral: Negative.     ?There were no vitals taken for this visit.There is no height or weight on file to calculate BMI.  ?General Appearance: Casual and Fairly Groomed  ?Eye Contact:  Good  ?Speech:  Clear and Coherent and Normal Rate  ?  Volume:  Normal  ?Mood:  Euthymic  ?Affect:  Appropriate and Congruent  ?Thought Process:  Coherent, Goal Directed, and NA  ?Orientation:  Full (Time, Place, and Person)  ?Thought Content: WDL and Logical   ?Suicidal Thoughts:  No  ?Homicidal Thoughts:  No  ?Memory:  Immediate;   Good ?Recent;   Good  ?Judgement:  Good  ?Insight:  Good  ?Psychomotor Activity:  Normal  ?Concentration:  Concentration: Good and Attention Span: Good  ?Recall:  Good  ?Fund of Knowledge: Good  ?Language: Good  ?Akathisia:  No  ?Handed:  Right  ?AIMS (if indicated): done  ?Assets:  Communication Skills ?Desire for Improvement ?Financial Resources/Insurance ?Housing ?Intimacy ?Leisure Time ?Physical Health ?Resilience ?Social Support  ?ADL's:  Intact  ?Cognition: WNL  ?Sleep:  Good  ? ?Screenings: ?AIMS   ? ?Flowsheet Row Admission (Discharged) from 05/09/2021 in [facility]  ?AIMS Total Score 0  ? ?  ? ?AUDIT   ? ?Flowsheet Row Admission (Discharged) from 05/09/2021 in [facility]  ?Alcohol Use Disorder Identification Test Final Score (AUDIT) 5  ? ?  ? ?PHQ2-9   ? ?[place] Office Visit from 02/02/2022 in [facility] Office Visit from 01/18/2022 in [facility] Office Visit from  01/01/2022 in [facility] Office Visit from 11/11/2021 in [facility] Office Visit from 07/07/2021 in [facility]  ?PHQ-2 Total Score 0 '1 2 1 1  '$ ?PHQ-9 Total [place]

## 2022-02-10 ENCOUNTER — Encounter (HOSPITAL_COMMUNITY): Payer: Self-pay

## 2022-02-10 NOTE — Plan of Care (Signed)
Client was in agreement to the plan. ?
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
## 2022-03-03 ENCOUNTER — Other Ambulatory Visit (HOSPITAL_COMMUNITY): Payer: Self-pay

## 2022-03-03 ENCOUNTER — Other Ambulatory Visit (HOSPITAL_COMMUNITY): Payer: Self-pay | Admitting: Student in an Organized Health Care Education/Training Program

## 2022-03-03 DIAGNOSIS — F29 Unspecified psychosis not due to a substance or known physiological condition: Secondary | ICD-10-CM

## 2022-03-04 ENCOUNTER — Other Ambulatory Visit (HOSPITAL_COMMUNITY): Payer: Self-pay

## 2022-03-05 ENCOUNTER — Other Ambulatory Visit (HOSPITAL_COMMUNITY): Payer: Self-pay

## 2022-03-08 ENCOUNTER — Other Ambulatory Visit (HOSPITAL_COMMUNITY): Payer: Self-pay | Admitting: Student in an Organized Health Care Education/Training Program

## 2022-03-08 ENCOUNTER — Other Ambulatory Visit (HOSPITAL_COMMUNITY): Payer: Self-pay

## 2022-03-08 DIAGNOSIS — F29 Unspecified psychosis not due to a substance or known physiological condition: Secondary | ICD-10-CM

## 2022-03-08 MED ORDER — DIVALPROEX SODIUM ER 500 MG PO TB24
1000.0000 mg | ORAL_TABLET | Freq: Every day | ORAL | 0 refills | Status: DC
  Filled 2022-03-08: qty 60, 30d supply, fill #0

## 2022-03-08 MED ORDER — DOXEPIN HCL 100 MG PO CAPS
100.0000 mg | ORAL_CAPSULE | Freq: Every day | ORAL | 0 refills | Status: DC
  Filled 2022-03-08: qty 30, 30d supply, fill #0

## 2022-03-08 MED ORDER — BENZTROPINE MESYLATE 0.5 MG PO TABS
0.5000 mg | ORAL_TABLET | Freq: Four times a day (QID) | ORAL | 0 refills | Status: DC | PRN
  Filled 2022-03-08: qty 60, 15d supply, fill #0

## 2022-03-08 NOTE — Telephone Encounter (Signed)
Contacted by pharmacy for refills on prescriptions. 1 Month refill sent in. ? ?Prescriptions sent ?-Depakote 1000 mg QHS: 60 tablets with 0 refills ?-Cogentin 0.5 mg q6 PRN: 60 tablets with 0 refills ?-Doxipin 100 mg QHS: 30 capsules with 0 refills ? ? ?Fatima Sanger MD ?Resident ? ?

## 2022-03-09 ENCOUNTER — Other Ambulatory Visit: Payer: Self-pay

## 2022-03-09 ENCOUNTER — Encounter (HOSPITAL_COMMUNITY): Payer: Self-pay

## 2022-03-09 ENCOUNTER — Other Ambulatory Visit (HOSPITAL_COMMUNITY): Payer: Self-pay

## 2022-03-09 ENCOUNTER — Ambulatory Visit (INDEPENDENT_AMBULATORY_CARE_PROVIDER_SITE_OTHER): Payer: Medicaid Other | Admitting: *Deleted

## 2022-03-09 ENCOUNTER — Other Ambulatory Visit (HOSPITAL_COMMUNITY): Payer: Self-pay | Admitting: Registered Nurse

## 2022-03-09 VITALS — BP 131/97 | HR 81 | Ht 62.0 in | Wt 180.0 lb

## 2022-03-09 DIAGNOSIS — F29 Unspecified psychosis not due to a substance or known physiological condition: Secondary | ICD-10-CM

## 2022-03-09 DIAGNOSIS — Z7689 Persons encountering health services in other specified circumstances: Secondary | ICD-10-CM | POA: Diagnosis not present

## 2022-03-09 MED ORDER — BENZTROPINE MESYLATE 0.5 MG PO TABS
0.5000 mg | ORAL_TABLET | Freq: Four times a day (QID) | ORAL | 0 refills | Status: DC | PRN
  Filled 2022-03-09 – 2022-03-22 (×2): qty 60, 15d supply, fill #0

## 2022-03-09 MED ORDER — OLANZAPINE 15 MG PO TBDP
15.0000 mg | ORAL_TABLET | Freq: Every day | ORAL | 2 refills | Status: DC
  Filled 2022-03-09: qty 30, 30d supply, fill #0

## 2022-03-09 MED ORDER — DIVALPROEX SODIUM ER 500 MG PO TB24
1000.0000 mg | ORAL_TABLET | Freq: Every day | ORAL | 2 refills | Status: DC
  Filled 2022-03-09 – 2022-03-22 (×2): qty 60, 30d supply, fill #0

## 2022-03-09 MED ORDER — HALOPERIDOL DECANOATE 100 MG/ML IM SOLN
100.0000 mg | INTRAMUSCULAR | 2 refills | Status: DC
  Filled 2022-03-09: qty 1, 30d supply, fill #0

## 2022-03-09 MED ORDER — OLANZAPINE 15 MG PO TBDP: 15.0000 mg | ORAL_TABLET | Freq: Every day | ORAL | 2 refills | Status: DC

## 2022-03-09 MED ORDER — DOXEPIN HCL 100 MG PO CAPS
100.0000 mg | ORAL_CAPSULE | Freq: Every day | ORAL | 2 refills | Status: DC
  Filled 2022-03-09 – 2022-03-22 (×2): qty 30, 30d supply, fill #0

## 2022-03-09 NOTE — Progress Notes (Signed)
In with her daugther for her monthly injection of Haldol D 100 mg, given today in her R GLUTEAL muscle without difficulty. She is asking for all of her oral medicines to go to the [facility]. Asked [place] NP to check her chart and move them to the preffered pharmacy. She has a good appearance, is pleasant and appropriate. She offers no complaints She is to return in one month for her next shot.  ?

## 2022-03-10 ENCOUNTER — Other Ambulatory Visit: Payer: Self-pay

## 2022-03-16 ENCOUNTER — Other Ambulatory Visit: Payer: Self-pay

## 2022-03-22 ENCOUNTER — Other Ambulatory Visit (HOSPITAL_COMMUNITY): Payer: Self-pay

## 2022-03-22 DIAGNOSIS — F29 Unspecified psychosis not due to a substance or known physiological condition: Secondary | ICD-10-CM | POA: Diagnosis not present

## 2022-03-23 LAB — CBC WITH DIFFERENTIAL/PLATELET
Basophils Absolute: 0 10*3/uL (ref 0.0–0.2)
Basos: 1 %
EOS (ABSOLUTE): 0.1 10*3/uL (ref 0.0–0.4)
Eos: 2 %
Hematocrit: 41.9 % (ref 34.0–46.6)
Hemoglobin: 14.3 g/dL (ref 11.1–15.9)
Immature Grans (Abs): 0 10*3/uL (ref 0.0–0.1)
Immature Granulocytes: 0 %
Lymphocytes Absolute: 2.4 10*3/uL (ref 0.7–3.1)
Lymphs: 43 %
MCH: 29.1 pg (ref 26.6–33.0)
MCHC: 34.1 g/dL (ref 31.5–35.7)
MCV: 85 fL (ref 79–97)
Monocytes Absolute: 0.4 10*3/uL (ref 0.1–0.9)
Monocytes: 6 %
Neutrophils Absolute: 2.6 10*3/uL (ref 1.4–7.0)
Neutrophils: 48 %
Platelets: 284 10*3/uL (ref 150–450)
RBC: 4.92 x10E6/uL (ref 3.77–5.28)
RDW: 13.9 % (ref 11.7–15.4)
WBC: 5.5 10*3/uL (ref 3.4–10.8)

## 2022-03-23 LAB — HEMOGLOBIN A1C
Est. average glucose Bld gHb Est-mCnc: 123 mg/dL
Hgb A1c MFr Bld: 5.9 % — ABNORMAL HIGH (ref 4.8–5.6)

## 2022-03-23 LAB — COMPREHENSIVE METABOLIC PANEL
ALT: 11 IU/L (ref 0–32)
AST: 17 IU/L (ref 0–40)
Albumin/Globulin Ratio: 2.4 — ABNORMAL HIGH (ref 1.2–2.2)
Albumin: 4.7 g/dL (ref 3.8–4.9)
Alkaline Phosphatase: 93 IU/L (ref 44–121)
BUN/Creatinine Ratio: 12 (ref 9–23)
BUN: 11 mg/dL (ref 6–24)
Bilirubin Total: <0.2 mg/dL (ref 0.0–1.2)
CO2: 20 mmol/L (ref 20–29)
Calcium: 9.6 mg/dL (ref 8.7–10.2)
Chloride: 106 mmol/L (ref 96–106)
Creatinine, Ser: 0.91 mg/dL (ref 0.57–1.00)
Globulin, Total: 2 g/dL (ref 1.5–4.5)
Glucose: 90 mg/dL (ref 70–99)
Potassium: 4.1 mmol/L (ref 3.5–5.2)
Sodium: 143 mmol/L (ref 134–144)
Total Protein: 6.7 g/dL (ref 6.0–8.5)
eGFR: 76 mL/min/{1.73_m2} (ref >59)

## 2022-03-23 LAB — MAGNESIUM: Magnesium: 2 mg/dL (ref 1.6–2.3)

## 2022-03-23 LAB — LIPID PANEL
Chol/HDL Ratio: 3.2 ratio (ref 0.0–4.4)
Cholesterol, Total: 213 mg/dL — ABNORMAL HIGH (ref 100–199)
HDL: 67 mg/dL (ref >39)
LDL Chol Calc (NIH): 104 mg/dL — ABNORMAL HIGH (ref 0–99)
Triglycerides: 250 mg/dL — ABNORMAL HIGH (ref 0–149)
VLDL Cholesterol Cal: 42 mg/dL — ABNORMAL HIGH (ref 5–40)

## 2022-03-23 LAB — TSH: TSH: 3.29 u[IU]/mL (ref 0.450–4.500)

## 2022-03-23 LAB — VALPROIC ACID LEVEL: Valproic Acid Lvl: <4 ug/mL — ABNORMAL LOW (ref 50–100)

## 2022-04-08 ENCOUNTER — Ambulatory Visit (INDEPENDENT_AMBULATORY_CARE_PROVIDER_SITE_OTHER): Payer: Medicaid Other | Admitting: *Deleted

## 2022-04-08 ENCOUNTER — Ambulatory Visit (INDEPENDENT_AMBULATORY_CARE_PROVIDER_SITE_OTHER): Payer: Medicaid Other | Admitting: Registered Nurse

## 2022-04-08 ENCOUNTER — Encounter (HOSPITAL_COMMUNITY): Payer: Self-pay

## 2022-04-08 ENCOUNTER — Telehealth (HOSPITAL_COMMUNITY): Payer: Self-pay | Admitting: *Deleted

## 2022-04-08 ENCOUNTER — Encounter (HOSPITAL_COMMUNITY): Payer: Self-pay | Admitting: Registered Nurse

## 2022-04-08 DIAGNOSIS — F29 Unspecified psychosis not due to a substance or known physiological condition: Secondary | ICD-10-CM | POA: Diagnosis not present

## 2022-04-08 DIAGNOSIS — F209 Schizophrenia, unspecified: Secondary | ICD-10-CM

## 2022-04-08 DIAGNOSIS — Z7689 Persons encountering health services in other specified circumstances: Secondary | ICD-10-CM | POA: Diagnosis not present

## 2022-04-08 DIAGNOSIS — F5105 Insomnia due to other mental disorder: Secondary | ICD-10-CM

## 2022-04-08 MED ORDER — DOXEPIN HCL 100 MG PO CAPS: 100.0000 mg | ORAL_CAPSULE | Freq: Every day | ORAL | 2 refills | Status: DC

## 2022-04-08 MED ORDER — HALOPERIDOL DECANOATE 100 MG/ML IM SOLN: 100.0000 mg | INTRAMUSCULAR | 2 refills | Status: DC

## 2022-04-08 MED ORDER — DIVALPROEX SODIUM ER 500 MG PO TB24: 1000.0000 mg | ORAL_TABLET | Freq: Every day | ORAL | 2 refills | Status: DC

## 2022-04-08 MED ORDER — BENZTROPINE MESYLATE 0.5 MG PO TABS: 0.5000 mg | ORAL_TABLET | Freq: Three times a day (TID) | ORAL | 0 refills | Status: DC | PRN

## 2022-04-08 NOTE — Addendum Note (Signed)
Addended by: Earleen Newport B on: 04/08/2022 03:26 PM   Modules accepted: Orders

## 2022-04-08 NOTE — Progress Notes (Signed)
In as scheduled for her monthly injection of Haldol D 100 mg given today in her R DELTOID without issue. She is well dressed, pleasant and appropriately verbal. She denies any concerns. No summer vacation plans but wishes she was able to go the beach, its her favorite place. She was seen by [facility] NP briefly re her medicine and recent lab work. She is to return in one month.

## 2022-04-08 NOTE — Progress Notes (Addendum)
BH MD/PA/NP OP Progress Note  04/08/2022 3:24 PM Anita Hill  MRN:  [id number]  Chief Complaint:  Chief Complaint  Patient presents with   Follow up psychiatric evaluation and administration of LAI   HPI: Anita Hill 51 y.o. female seen face to face in office today for follow-up psychiatric evaluation and administration of long acting injectable Haldol Decanoate.  Her psychiatric history includes bipolar 1 disorder, schizophrenia unspecified type and alcohol use disorder.  Her mental health is managed with Haldol Decanoate 100 mg monthly, Depakote 1000 ng Q hs, and Sinequan 100 mg Q hs.  Reports she has been taking medications as ordered for the last week and half.  States that her daughter was supposed to be picking up her medications at pharmacy and putting in pill box for her but wasn't doing it correctly.  She also states that her PCP stopped her Zyprexa stating that she did not need it but unsure why he felt she did not need it.  States she hasn't taken in over a month.  Informed I would not add back to medication regimen since she hadn't been taking it but it would be better if her PCP would collaborate on why medications have been stopped and to let her psychiatric provider manage her psychotropic medications instead of have multiple people managing medications.  Understanding voiced.  She does report when she takes her medications as ordered she feels better and there have been no adverse reactions to medications.  She had Valproic acid level done but at a time when she was not taking her Depakote.  Has been taking now for 1.5 weeks informed needed to have level repeated to make sure she is not getting to much or to little; understanding voiced.  She denies depression, anxiety, fluctuations in mood, abnormal movement, suicidal/self-harm/homicidal ideation, psychosis, and paranoia.  AIMS, PHQ 2 & 9, C-SSRS, and GAD 7 screenings conducted by provider see scores below. She reports eating and  sleeping without difficulty.    Visit Diagnosis:    ICD-10-CM   1. 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       Past Psychiatric History: Bipolar 1 disorder, schizophrenia unspecified type, alcohol use disorder  Past Medical History:  Past Medical History:  Diagnosis Date   BIPOLAR DISORDER    Chest pain 01/20/2016   Delusions ([place])    Dyspnea 10/13/2012   Homelessness    Left ventricular hypertrophy    Palpitations    Psychosis ([place])    Sickle cell trait ([place])    Substance abuse ([place])     Past Surgical History:  Procedure Laterality Date   BUNIONECTOMY      Family Psychiatric History: Maternal uncle alcohol use disorder  Family History:  Family History  Problem Relation Age of Onset   Heart disease Mother        CHF   Prostate cancer Father     Social History:  Social History   Socioeconomic History   Marital status: Single    Spouse name: Not on file   Number of children: 2   Years of education: Not on file   Highest education level: Not on file  Occupational History  Comment: Unemployed  Tobacco Use   Smoking status: Every Day    Packs/day: 1.00    Years: 10.00    Total pack years: 10.00    Types: Cigarettes, Cigars   Smokeless tobacco: Never  Vaping Use   Vaping Use: Never used  Substance and Sexual Activity   Alcohol use: Yes    Comment: everyday 20-80oz   Drug use: No    Comment: Previous cocaine, heroin, marijuana   Sexual activity: Not Currently  Other Topics Concern   Not on file  Social History Narrative   Not on file   Social Determinants of Health   Financial Resource Strain: Low Risk  (07/15/2021)   Overall Financial Resource Strain (CARDIA)    Difficulty of Paying  Living Expenses: Not hard at all  Food Insecurity: No Food Insecurity (07/15/2021)   Hunger Vital Sign    Worried About Running Out of Food in the Last Year: Never true    Ran Out of Food in the Last Year: Never true  Transportation Needs: No Transportation Needs (07/15/2021)   PRAPARE - Hydrologist (Medical): No    Lack of Transportation (Non-Medical): No  Physical Activity: Not on file  Stress: Not on file  Social Connections: Not on file    Allergies:  Allergies  Allergen Reactions   Strawberry Extract Anaphylaxis   Risperidone And Related Hives, Nausea Only and Other (See Comments)    Made me feel jittery and restless/ Pt denies this allergy on 01/27/21   Latuda [Lurasidone Hcl] Other (See Comments)    Hot and cold flashes    Metabolic Disorder Labs: Lab Results  Component Value Date   HGBA1C 5.9 (H) 03/22/2022   MPG 125.5 05/08/2021   Lab Results  Component Value Date   PROLACTIN 2.6 (L) 05/08/2021   Lab Results  Component Value Date   CHOL 213 (H) 03/22/2022   TRIG 250 (H) 03/22/2022   HDL 67 03/22/2022   CHOLHDL 3.2 03/22/2022   VLDL 13 05/08/2021   LDLCALC 104 (H) 03/22/2022   LDLCALC 78 05/08/2021   Lab Results  Component Value Date   TSH 3.290 03/22/2022   TSH 3.006 05/08/2021    Therapeutic Level Labs: No results found for: "LITHIUM" Lab Results  Component Value Date   VALPROATE <4 (L) 03/22/2022   VALPROATE 50 05/16/2021   Lab Results  Component Value Date   CBMZ <2.0 (L) 01/08/2008    Current Medications: Current Outpatient Medications  Medication Sig Dispense Refill   benztropine (COGENTIN) 0.5 MG tablet Take 1 tablet (0.5 mg total) by mouth 3 (three) times daily as needed for tremors (muscle stiffness). 60 tablet 0   divalproex (DEPAKOTE ER) 500 MG 24 hr tablet Take 2 tablets (1,000 mg total) by mouth daily at 10 pm. 60 tablet 2   doxepin (SINEQUAN) 100 MG capsule Take 1 capsule (100 mg total) by mouth at  bedtime. 30 capsule 2   haloperidol decanoate (HALDOL DECANOATE) 100 MG/ML injection Inject 1 mL (100 mg total) into the muscle every 30 (thirty) days. 1 mL 2   meloxicam (MOBIC) 15 MG tablet Take 0.5-1 tablets (7.5-15 mg total) by mouth daily as needed for pain. 30 tablet 6   metoprolol succinate (TOPROL-XL) 25 MG 24 hr tablet TAKE 1 TABLET(25 MG) BY MOUTH DAILY 30 tablet 3   nicotine (NICODERM CQ - DOSED IN MG/24 HOURS) 14 mg/24hr patch Place 1 patch (14 mg total) onto the skin every morning. [place]  patch 3   Current Facility-Administered Medications  Medication Dose Route Frequency Provider Last Rate Last Admin   haloperidol decanoate (HALDOL DECANOATE) 100 MG/ML injection 100 mg  100 mg Intramuscular Q30 days Lucky Rathke, FNP   100 mg at 03/09/22 1148     Musculoskeletal: Strength & Muscle Tone: within normal limits Gait & Station: normal Patient leans: N/A  Psychiatric Specialty Exam: Review of Systems  Constitutional: Negative.   HENT: Negative.    Eyes: Negative.   Respiratory: Negative.    Cardiovascular: Negative.   Gastrointestinal: Negative.   Genitourinary: Negative.   Musculoskeletal: Negative.   Skin: Negative.   Neurological: Negative.   Hematological: Negative.   Psychiatric/Behavioral:  Negative for agitation, decreased concentration, hallucinations, self-injury, sleep disturbance and suicidal ideas. The patient is not nervous/anxious and is not hyperactive.     There were no vitals taken for this visit.There is no height or weight on file to calculate BMI.  General Appearance: Casual and Neat  Eye Contact:  Good  Speech:  Clear and Coherent and Normal Rate  Volume:  Normal  Mood:  Euthymic  Affect:  Appropriate and Congruent  Thought Process:  Coherent, Goal Directed, and Descriptions of Associations: Intact  Orientation:  Full (Time, Place, and Person)  Thought Content: WDL and Logical   Suicidal Thoughts:  No  Homicidal Thoughts:  No  Memory:  Immediate;    Good Recent;   Good Remote;   Good  Judgement:  Intact  Insight:  Good  Psychomotor Activity:  Normal  Concentration:  Concentration: Good and Attention Span: Good  Recall:  Good  Fund of Knowledge: Good  Language: Good  Akathisia:  No  Handed:  Right  AIMS (if indicated): done  Assets:  Communication Skills Desire for Improvement Financial Resources/Insurance Housing Intimacy Leisure Time Resilience Social Support  ADL's:  Intact  Cognition: WNL  Sleep:  Good   Screenings: [place] Office Visit from 04/08/2022 in [facility] Office Visit from 02/09/2022 in [facility] Admission (Discharged) from 05/09/2021 in [place] Total Score 0 0 0      AUDIT    Flowsheet Row Admission (Discharged) from 05/09/2021 in [place] [facility]  Alcohol Use Disorder Identification Test Final Score (AUDIT) 5      GAD-7    Flowsheet Row Office Visit from 04/08/2022 in [facility]  Total GAD-7 Score 0      PHQ2-9    [place] Office Visit from 04/08/2022 in [facility] Office Visit from 02/09/2022 in [facility] Office Visit from 02/02/2022 in [place] Office Visit from 01/18/2022 in [place] Office Visit from 01/01/2022 in [place]  PHQ-2 Total Score 0 0 0 1 2  PHQ-9 Total Score -- -- '6 7 6      '$ [place] Office Visit from 04/08/2022 in [facility] Counselor from 11/17/2021 in [facility] ED from 07/01/2021 in [place] DEPT  C-SSRS RISK CATEGORY No Risk No Risk No Risk      Assessment and Plan: Anita Hill appears to be doing well.  She reports that medications are effective and managing her mental  health without any adverse reaction when taking.  Pharmacy changed to [facility] that will bubble pack and  deliver to her home to assure that she is getting her medications as order.  Patient is in agreement that this would be better.  During visit she is dressed appropriate for weather.  She is sitting upright in chair with no noted distress.  She is alert/oriented x 4, calm/cooperative and mood is congruent with affect.  She spoke in a clear tone at moderate volume, and normal pace, with good eye contact.  His/Her thought process is coherent and relevant; and there is no indication that she is currently responding to internal/external stimuli, or experiencing delusional thought content.  She denies depression, anxiety, suicidal/self-harm/homicidal ideation, psychosis, paranoia, and fluctuations in mood.  Repeat of valproic acid ordered  1. Schizophrenia, unspecified type (HCC) - Valproic Acid level - divalproex (DEPAKOTE ER) 500 MG 24 hr tablet; Take 2 tablets (1,000 mg total) by mouth daily at 10 pm.  Dispense: 60 tablet; Refill: 2 - doxepin (SINEQUAN) 100 MG capsule; Take 1 capsule (100 mg total) by mouth at bedtime.  Dispense: 30 capsule; Refill: 2 - haloperidol decanoate (HALDOL DECANOATE) 100 MG/ML injection; Inject 1 mL (100 mg total) into the muscle every 30 (thirty) days.  Dispense: 1 mL; Refill: 2 - benztropine (COGENTIN) 0.5 MG tablet; Take 1 tablet (0.5 mg total) by mouth 3 (three) times daily as needed for tremors (muscle stiffness).  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    Dispense:  60 tablet    Refill:  2    Please bubble pack and deliver to patients home     Order Specific Question:   Supervising Provider    Answer:   Hampton Abbot [[id number]]   doxepin (SINEQUAN) 100 MG capsule    Sig: Take 1 capsule (100 mg total) by mouth at bedtime.    Dispense:  30 capsule    Refill:  2    Please bubble pack and deliver to patients home    Order Specific Question:   Supervising Provider    Answer:   Hampton Abbot [[id number]]   haloperidol decanoate (HALDOL DECANOATE) 100 MG/ML injection    Sig: Inject 1 mL (100 mg total) into the muscle every 30 (thirty) days.    Dispense:  1 mL    Refill:  2    Delivery to [address] [address] give to Ms. Collie Siad    Order Specific Question:   Supervising Provider    Answer:   Hampton Abbot [[id number]]   benztropine (COGENTIN) 0.5 MG tablet    Sig: Take 1 tablet (0.5 mg total) by mouth 3 (three) times daily as needed for tremors (muscle stiffness).    Dispense:  60 tablet    Refill:  0    Please deliver to home    Order Specific Question:   Supervising Provider    Answer:   Hampton Abbot [[id number]]     Patient/Guardian was advised Release of Information must be obtained prior to any record release in order to collaborate their care with an outside provider. Patient/Guardian was advised if they have not already done so to contact the registration department to sign all necessary forms in order for [place] to release information regarding their care.  Consent: Patient/Guardian gives verbal consent for treatment and assignment of benefits for services provided during this visit. Patient/Guardian expressed understanding and agreed to proceed.   Follow up in one month  Anita Gitlin, NP 04/08/2022, 3:24 PM

## 2022-04-08 NOTE — Telephone Encounter (Signed)
Call from [facility] for clarification on how to bubble pack her meds. She only takes 2 meds at HS and the Cogentin is PRN and she can get that in a bottle. She is to have weekly bubble pack cards and her Haldol vial will come to this office.

## 2022-04-08 NOTE — Progress Notes (Signed)
haloperidol decanoate (HALDOL DECANOATE) 100 MG

## 2022-04-19 ENCOUNTER — Ambulatory Visit: Payer: Self-pay | Admitting: Family Medicine

## 2022-04-20 ENCOUNTER — Ambulatory Visit: Payer: Medicaid Other | Admitting: Family Medicine

## 2022-04-21 ENCOUNTER — Ambulatory Visit: Payer: Medicaid Other | Admitting: Family Medicine

## 2022-04-22 DIAGNOSIS — Z7689 Persons encountering health services in other specified circumstances: Secondary | ICD-10-CM | POA: Diagnosis not present

## 2022-04-26 ENCOUNTER — Ambulatory Visit: Payer: Medicaid Other | Admitting: Family Medicine

## 2022-04-26 NOTE — Progress Notes (Deleted)
    SUBJECTIVE:   CHIEF COMPLAINT / HPI:   *** HTN: - Medications: *** - Compliance: *** - Checking BP at home: *** - Denies any SOB, CP, vision changes, LE edema, medication SEs, or symptoms of hypotension - Diet: *** - Exercise: ***  PERTINENT  PMH / PSH: ***  OBJECTIVE:   There were no vitals taken for this visit.  ***  ASSESSMENT/PLAN:   No problem-specific Assessment & Plan notes found for this encounter.     Rise Patience, [place]

## 2022-04-27 ENCOUNTER — Ambulatory Visit (INDEPENDENT_AMBULATORY_CARE_PROVIDER_SITE_OTHER): Payer: Medicaid Other | Admitting: Clinical

## 2022-04-27 DIAGNOSIS — Z7689 Persons encountering health services in other specified circumstances: Secondary | ICD-10-CM | POA: Diagnosis not present

## 2022-04-27 DIAGNOSIS — F209 Schizophrenia, unspecified: Secondary | ICD-10-CM

## 2022-05-01 NOTE — Progress Notes (Signed)
   THERAPIST PROGRESS NOTE  Session Time: 45 minutes  Participation Level: Active  Behavioral Response: CasualAlertEuthymic  Type of Therapy: Individual Therapy  Treatment Goals addressed: Client will participate in at least 80% of scheduled individual psychotherapy sessions  ProgressTowards Goals: Progressing  Interventions: CBT and Supportive  Summary:  Anita Hill is a 51 y.o. female who presents for scheduled session oriented x5, appropriately dressed, and friendly.  Client denied hallucinations and delusions. Client reported on today she is doing fairly well.  Client reported her home life with her daughter has been pretty stable lately.  Client reported she has been looking into things to keep her busy during the week.  Client reported she is waiting to hear back from a GED program about credits that she can apply to work towards getting her diploma.  Client reported she has also been in contact with [employer] to potentially start their computer literacy program.  Client reported her daughter started a new job and she wants to find things to do outside the house so they can both have time at home alone.  Client reported her daughter does give her lectures whenever she feels irritable but the client reported she is able to manage by not letting it because her to become flustered in the moment.  Client reported she is medication compliant but notes that she has gained some weight and is not sure how that has occurred. Evidence of progress towards goal: Client reported she is medication compliant 7 out of 7 days.  Client reported uses 1 skills such as behavioral activation by keeping herself motivated listening to faith-based podcast and videos.   Suicidal/Homicidal: Nowithout intent/plan  Therapist Response:  Therapist began the appointment asking the client how she has been doing since last seen. Therapist used CBT to engage using active listening and positive emotional support  towards her thoughts and feelings. Therapist used CBT to ask the client about the effect of interpersonal relationships and how she is managing them. Therapist used CBT to engage the client to ask about interest of activities that she is looking to engage in to keep her productive during the week. Therapist used CBT to ask the client about medication compliance. Therapist used CBT ask the client to identify her progress with frequency of use with coping skills with continued practice in her daily activity.    Therapist assigned client homework to practice self-care. Client was scheduled for next appointment.    Plan: Return again in 5 weeks.  Diagnosis: Schizophrenia, unspecified type  Collaboration of Care: Patient refused AEB none requested at this time  Patient/Guardian was advised Release of Information must be obtained prior to any record release in order to collaborate their care with an outside provider. Patient/Guardian was advised if they have not already done so to contact the registration department to sign all necessary forms in order for [place] to release information regarding their care.   Consent: Patient/Guardian gives verbal consent for treatment and assignment of benefits for services provided during this visit. Patient/Guardian expressed understanding and agreed to proceed.   [place], LCSW 04/27/2022

## 2022-05-01 NOTE — Plan of Care (Signed)
  Problem: Depression CCP Problem  1  Goal: LTG: Anita Hill WILL SCORE LESS THAN 10 ON THE PATIENT HEALTH QUESTIONNAIRE (PHQ-9) Outcome: Progressing Goal: STG: Anita Hill WILL PARTICIPATE IN AT LEAST 80% OF SCHEDULED INDIVIDUAL PSYCHOTHERAPY SESSIONS Outcome: Progressing Goal: STG: Anita Hill WILL COMPLETE AT LEAST 80% OF ASSIGNED HOMEWORK Outcome: Progressing   

## 2022-05-05 ENCOUNTER — Ambulatory Visit: Payer: Medicaid Other | Admitting: Family Medicine

## 2022-05-07 ENCOUNTER — Other Ambulatory Visit: Payer: Medicaid Other

## 2022-05-07 ENCOUNTER — Ambulatory Visit: Payer: Medicaid Other

## 2022-05-11 ENCOUNTER — Ambulatory Visit (HOSPITAL_COMMUNITY): Payer: Medicaid Other

## 2022-05-14 ENCOUNTER — Ambulatory Visit: Payer: Medicaid Other | Admitting: Family Medicine

## 2022-05-17 ENCOUNTER — Encounter: Payer: Self-pay | Admitting: Family Medicine

## 2022-05-17 ENCOUNTER — Ambulatory Visit (HOSPITAL_COMMUNITY): Payer: Self-pay | Admitting: Clinical

## 2022-05-18 ENCOUNTER — Encounter (HOSPITAL_COMMUNITY): Payer: Self-pay

## 2022-05-18 ENCOUNTER — Telehealth (HOSPITAL_COMMUNITY): Payer: Self-pay | Admitting: *Deleted

## 2022-05-18 ENCOUNTER — Ambulatory Visit (INDEPENDENT_AMBULATORY_CARE_PROVIDER_SITE_OTHER): Payer: Medicaid Other | Admitting: *Deleted

## 2022-05-18 VITALS — BP 151/112 | HR 89 | Ht 62.0 in | Wt 189.0 lb

## 2022-05-18 DIAGNOSIS — F209 Schizophrenia, unspecified: Secondary | ICD-10-CM

## 2022-05-18 DIAGNOSIS — Z7689 Persons encountering health services in other specified circumstances: Secondary | ICD-10-CM | POA: Diagnosis not present

## 2022-05-18 NOTE — Telephone Encounter (Signed)
Opened in error

## 2022-05-18 NOTE — Progress Notes (Signed)
Patient presents to the office for Haloperidol Decanoate 100 mg injection given in left upper outer Quadrant . She is doing well overall no complaints offered will return in 28 days.

## 2022-05-18 NOTE — Telephone Encounter (Signed)
Tried to call Lana Fish, patients daughter no answer and no vm set up

## 2022-05-19 ENCOUNTER — Telehealth (HOSPITAL_COMMUNITY): Payer: Self-pay | Admitting: *Deleted

## 2022-05-19 NOTE — Telephone Encounter (Signed)
VM from [employer], patients daughter and guardian asking for all her medicines to be sent to [facility] and be delievered. Spoke with Andee Poles at the pharmacy and they were unable to make contact with them and therefore it was not delievered but all meds are there. Called [name] to inform and she expressed her appreciation, the other thing that initially held up delivery was not having a credit card on file but she took care of that yesterday.

## 2022-05-26 DIAGNOSIS — Z7689 Persons encountering health services in other specified circumstances: Secondary | ICD-10-CM | POA: Diagnosis not present

## 2022-05-31 DIAGNOSIS — F29 Unspecified psychosis not due to a substance or known physiological condition: Secondary | ICD-10-CM | POA: Diagnosis not present

## 2022-06-01 LAB — VALPROIC ACID LEVEL: Valproic Acid Lvl: 78 ug/mL (ref 50–100)

## 2022-06-07 ENCOUNTER — Ambulatory Visit (INDEPENDENT_AMBULATORY_CARE_PROVIDER_SITE_OTHER): Payer: Medicaid Other | Admitting: Family Medicine

## 2022-06-07 ENCOUNTER — Encounter: Payer: Self-pay | Admitting: Family Medicine

## 2022-06-07 VITALS — BP 127/80 | HR 104 | Ht 62.0 in | Wt 201.0 lb

## 2022-06-07 DIAGNOSIS — I10 Essential (primary) hypertension: Secondary | ICD-10-CM | POA: Diagnosis not present

## 2022-06-07 DIAGNOSIS — R7303 Prediabetes: Secondary | ICD-10-CM

## 2022-06-07 DIAGNOSIS — M25562 Pain in left knee: Secondary | ICD-10-CM | POA: Diagnosis not present

## 2022-06-07 DIAGNOSIS — G8929 Other chronic pain: Secondary | ICD-10-CM | POA: Diagnosis not present

## 2022-06-07 DIAGNOSIS — Z7409 Other reduced mobility: Secondary | ICD-10-CM

## 2022-06-07 DIAGNOSIS — E669 Obesity, unspecified: Secondary | ICD-10-CM | POA: Diagnosis not present

## 2022-06-07 MED ORDER — SEMAGLUTIDE-WEIGHT MANAGEMENT 0.25 MG/0.5ML ~~LOC~~ SOAJ: 0.2500 mg | SUBCUTANEOUS | 1 refills | Status: DC

## 2022-06-07 NOTE — Progress Notes (Signed)
    SUBJECTIVE:   CHIEF COMPLAINT / HPI:   HTN - BP has been elevated in the behavioral health - Not checking at home  Left knee pain - Left knee and ankle swell and have significant pain - Limited mobility, cannot walk a block - Cannot stand over the shower for long peers of time - Is wanting to have a shower chair (contact information for the pharmacy is [facility] on [address] [address].  Phone number [phone]) - Patient is open to physical therapy, has had before for her knee - Reports has been evaluated by Ortho with her knee before  Weight gain  prediabetes - Previous A1c is elevated to 5.9 - Patient is interested in [place] if she qualifies - Patient has had a steady increase in weight in the last 8 months - Is on injectable psychiatric medications   PERTINENT  PMH / PSH: Reviewed  OBJECTIVE:   BP 127/80   Pulse (!) 104   Ht '5\' 2"'$  (1.575 m)   Wt 201 lb (91.2 kg)   SpO2 98%   BMI 36.76 kg/m   General: NAD, well-appearing, well-nourished Respiratory: No respiratory distress, breathing comfortably, able to speak in full sentences Skin: warm and dry, no rashes noted on exposed skin Psych: Appropriate affect and mood MSK: left knee non-tender to palpation, no obvious swelling during examination, abnormal gait with limp noted while patient walking  ASSESSMENT/PLAN:   Hypertension BP intermittently elevated at other visits with highest in the 150s.  Repeat blood pressure today in clinic was 127/80.  At this time, given normotensive readings in our office, will defer initiating blood pressure medication. - Continue to monitor, if elevated in other offices recommend repeating blood pressure check with a manual measurement   Weight gain  Prediabetes Last A1c elevated at 5.9.  Patient has concha mitten hypertension, prediabetes, BMI of 36 with increasing weight gain.  We will attempt to get patient approved for [facility]. - Wegovy 0.25 mg weekly ordered  Chronic  left knee pain and swelling  left ankle instability Patient ambulating with abnormal gait pattern secondary to pain.  Given limited mobility, feel that physical therapy is the best route to go.  Discussed OTC treatment as well.  Having difficulty with standing in the shower secondary to pain. - OTC for pain management - Referral to physical therapy placed - DME order for shower chair prescription given to patient  Rise Patience, [place]

## 2022-06-07 NOTE — Assessment & Plan Note (Addendum)
BP intermittently elevated at other visits with highest in the 150s.  Repeat blood pressure today in clinic was 127/80.  At this time, given normotensive readings in our office, will defer initiating blood pressure medication. - Continue to monitor, if elevated in other offices recommend repeating blood pressure check with a manual measurement

## 2022-06-07 NOTE — Patient Instructions (Addendum)
I am sending a referral to physical therapy and also giving you a prescription for the shower chair, call the office and let me know if they need something more than the prescription.   I am going to try and get you approved for [facility], but I cannot guarantee that your insurance will cover it.  We will keep an eye on your blood pressures, especially the lower number.  If it continues to be high with the next couple of visits I will probably start medications at that time.      Therapy and Counseling Resources Most providers on this list will take Medicaid. Patients with commercial insurance or Medicare should contact their insurance company to get a list of in network providers.  [employer] [employer] (takes children) Location 1: [address], [place], [place] [address] Location 2: [place], [place] [address] [place] ([place] speaking therapist available)(habla espanol)(take medicare and medicaid)  [place], [place], [place] [address], [place] [email or web address] [phone]  [facility]:Psychiatry and Counseling [address] [facility] [place]. [place], [place] [address] [place], [place], [place] [address]      [phone]  [place] (spanish available) [place], [place] [address] [place] (take [facility] and medicare) [address] [place], [place] [address]       [phone]     [place] (virtual only) [phone]  [facility] [address], [place], [place]  [facility] Solutions:  [place]. [place] [phone]  [facility]:  [address] [place] [phone]  [facility] (under & uninsured) [address], [place] [phone]    [email or web address]    [place] [address]  [place]    [phone]  [facility]     Phone:  [phone]     [place]  [place] [address]      [place], [place] ext [place]: [place], [place], [place]   [place] ([place] therapist) [email or web address]  [place] [address]   [place] [address]    [phone]    [facility]   [address],  [place], [place]   [place]  [place]  [facility]  [address] [place], [place]        [phone]  Open Access/Walk In Clinic under & uninsured  [facility]  [address] [place], [place] Crisis [phone]  [facility] [place],  ([place])   [place], [place]: [phone]) 8:30 - 12; 1 - 2:30  [facility] the [place],  [place], [place]    ([phone]):8:30 - 12; 2 - 3PM  [facility] [employer],  [address],  [place]; [phone]):   Mon - Fri 8 AM - 5 PM  [facility] [place]  MWF 12:30 to 3:00 or call to schedule an appointment  [phone]  Specific Provider options Psychology Today  [email or web address]  click on find a therapist  enter your zip code left side and select or tailor a therapist for your specific need.   [facility] Provider Directory [email or web address]  (Medicaid)   Follow all drop down to find a provider  [facility] or [email or web address] [address], [place], [place] Recovery support and educational   24- Hour Availability:   [facility]  [address] [place], [place] Crisis [phone]  [facility] [employer] [phone]  [place]  [phone]   [place]  [phone] (after hours)  Therapeutic Alternative/Mobile Crisis   [phone]  [place] National Suicide Hotline  [phone] Diamantina Monks)  Call 911 or go to emergency room  [facility]  [phone]);  [place] and [employer]  [phone]); [place], [place], [place], [place], [place], [place], [place]

## 2022-06-08 ENCOUNTER — Other Ambulatory Visit (HOSPITAL_COMMUNITY): Payer: Self-pay

## 2022-06-09 ENCOUNTER — Other Ambulatory Visit (HOSPITAL_COMMUNITY): Payer: Self-pay

## 2022-06-15 ENCOUNTER — Other Ambulatory Visit: Payer: Self-pay

## 2022-06-15 ENCOUNTER — Telehealth: Payer: Self-pay

## 2022-06-15 ENCOUNTER — Ambulatory Visit (INDEPENDENT_AMBULATORY_CARE_PROVIDER_SITE_OTHER): Payer: Medicaid Other | Admitting: Registered Nurse

## 2022-06-15 ENCOUNTER — Encounter (HOSPITAL_COMMUNITY): Payer: Self-pay

## 2022-06-15 VITALS — BP 150/101 | HR 79 | Ht 62.0 in | Wt 192.0 lb

## 2022-06-15 DIAGNOSIS — F411 Generalized anxiety disorder: Secondary | ICD-10-CM | POA: Diagnosis not present

## 2022-06-15 DIAGNOSIS — F5105 Insomnia due to other mental disorder: Secondary | ICD-10-CM

## 2022-06-15 DIAGNOSIS — F209 Schizophrenia, unspecified: Secondary | ICD-10-CM

## 2022-06-15 MED ORDER — HYDROXYZINE PAMOATE 25 MG PO CAPS
25.0000 mg | ORAL_CAPSULE | Freq: Three times a day (TID) | ORAL | 0 refills | Status: DC | PRN
  Filled 2022-06-15: qty 30, 10d supply, fill #0

## 2022-06-15 NOTE — Progress Notes (Signed)
Discussed lab work with Ms. Konkle Component Ref Range & Units 2 wk ago 2 mo ago 1 yr ago  Valproic Acid Lvl 50 - 100 ug/mL 78       Ms. Bin daughter also accompanied her during visit for long acting injectable and made the statement that at times when Ms. Texeira is agitated she has given her a pill that she was prescribed at one time that dissolves in the mouth but unsure of what the name of medications was.  Instructed that patient should only be taking medications that she is currently prescribed related to adverse reaction that could occur.  Chart review shows that patient was prescribed Zyprexa 30 mg.  Educated on the adverse reactions that could occur with Zyprexa such as cardiac issues.  Understanding voiced.   Will start Vistaril 25 mg Tid prn for anxiety that will also help calm down and relax.  Reports that it doesn't occur often but would be good to have something when it does.     Vistaril 25 mg Tid prn anxiety:  60 tablets with 0 refill  Shaleka Brines B. Evonte Prestage, NP

## 2022-06-15 NOTE — Telephone Encounter (Signed)
Rec'd PA request from patients pharmacy. Medicaid doesn't cover XAFHSV.

## 2022-06-21 ENCOUNTER — Other Ambulatory Visit: Payer: Self-pay

## 2022-06-21 ENCOUNTER — Ambulatory Visit (INDEPENDENT_AMBULATORY_CARE_PROVIDER_SITE_OTHER): Payer: Medicaid Other | Admitting: Podiatry

## 2022-06-21 DIAGNOSIS — Z91199 Patient's noncompliance with other medical treatment and regimen due to unspecified reason: Secondary | ICD-10-CM

## 2022-06-21 NOTE — Progress Notes (Signed)
   Complete physical exam  Patient: Anita Hill   DOB: 08/21/2000   51 y.o. Female  MRN: [id number]  Subjective:    No chief complaint on file.   Anita Hill is a 51 y.o. female who presents today for a complete physical exam. She reports consuming a {diet types:17450} diet. {types:19826} She generally feels {DESC; WELL/FAIRLY WELL/POORLY:18703}. She reports sleeping {DESC; WELL/FAIRLY WELL/POORLY:18703}. She {does/does not:200015} have additional problems to discuss today.    Most recent fall risk assessment:    04/28/2022   10:42 AM  Fall Risk   Falls in the past year? 0  Number falls in past yr: 0  Injury with Fall? 0  Risk for fall due to : No Fall Risks  Follow up Falls evaluation completed     Most recent depression screenings:    04/28/2022   10:42 AM 03/19/2021   10:46 AM  PHQ 2/9 Scores  PHQ - 2 Score 0 0  PHQ- 9 Score 5     {VISON DENTAL STD PSA (Optional):27386}  {History (Optional):23778}  Patient Care Team: Jessup, Joy, NP as PCP - General (Nurse Practitioner)   Outpatient Medications Prior to Visit  Medication Sig   fluticasone (FLONASE) 50 MCG/ACT nasal spray Place 2 sprays into both nostrils in the morning and at bedtime. After 7 days, reduce to once daily.   norgestimate-ethinyl estradiol (SPRINTEC 28) 0.25-35 MG-MCG tablet Take 1 tablet by mouth daily.   Nystatin POWD Apply liberally to affected area 2 times per day   spironolactone (ALDACTONE) 100 MG tablet Take 1 tablet (100 mg total) by mouth daily.   No facility-administered medications prior to visit.    ROS        Objective:     There were no vitals taken for this visit. {Vitals History (Optional):23777}  Physical Exam   No results found for any visits on 06/03/22. {Show previous labs (optional):23779}    Assessment & Plan:    Routine Health Maintenance and Physical Exam  Immunization History  Administered Date(s) Administered   DTaP 11/04/1999, 12/31/1999,  03/10/2000, 11/24/2000, 06/09/2004   Hepatitis A 04/05/2008, 04/11/2009   Hepatitis B 08/22/1999, 09/29/1999, 03/10/2000   HiB (PRP-OMP) 11/04/1999, 12/31/1999, 03/10/2000, 11/24/2000   IPV 11/04/1999, 12/31/1999, 08/29/2000, 06/09/2004   Influenza,inj,Quad PF,6+ Mos 07/12/2014   Influenza-Unspecified 10/11/2012   MMR 08/29/2001, 06/09/2004   Meningococcal Polysaccharide 04/10/2012   Pneumococcal Conjugate-13 11/24/2000   Pneumococcal-Unspecified 03/10/2000, 05/24/2000   Tdap 04/10/2012   Varicella 08/29/2000, 04/05/2008    Health Maintenance  Topic Date Due   HIV Screening  Never done   Hepatitis C Screening  Never done   INFLUENZA VACCINE  06/01/2022   PAP-Cervical Cytology Screening  06/03/2022 (Originally 08/20/2020)   PAP SMEAR-Modifier  06/03/2022 (Originally 08/20/2020)   TETANUS/TDAP  06/03/2022 (Originally 04/10/2022)   HPV VACCINES  Discontinued   COVID-19 Vaccine  Discontinued    Discussed health benefits of physical activity, and encouraged her to engage in regular exercise appropriate for her age and condition.  Problem List Items Addressed This Visit   None Visit Diagnoses     Annual physical exam    -  Primary   Cervical cancer screening       Need for Tdap vaccination          No follow-ups on file.     Joy Jessup, NP   

## 2022-06-23 DIAGNOSIS — Z7689 Persons encountering health services in other specified circumstances: Secondary | ICD-10-CM | POA: Diagnosis not present

## 2022-06-28 ENCOUNTER — Encounter: Payer: Self-pay | Admitting: Family Medicine

## 2022-06-28 ENCOUNTER — Ambulatory Visit (INDEPENDENT_AMBULATORY_CARE_PROVIDER_SITE_OTHER): Payer: Medicaid Other | Admitting: Family Medicine

## 2022-06-28 VITALS — BP 128/72 | HR 90 | Ht 62.0 in | Wt 202.4 lb

## 2022-06-28 DIAGNOSIS — Z0000 Encounter for general adult medical examination without abnormal findings: Secondary | ICD-10-CM

## 2022-06-28 DIAGNOSIS — Z1211 Encounter for screening for malignant neoplasm of colon: Secondary | ICD-10-CM | POA: Diagnosis not present

## 2022-06-28 DIAGNOSIS — I10 Essential (primary) hypertension: Secondary | ICD-10-CM

## 2022-06-28 DIAGNOSIS — H538 Other visual disturbances: Secondary | ICD-10-CM

## 2022-06-28 NOTE — Progress Notes (Signed)
    SUBJECTIVE:   CHIEF COMPLAINT / HPI:   BP - Has not been taking at home  Colonoscopy - Patient reports she has not heard anything from GI - She reports that she would like to get a colonoscopy for colon cancer screening  Vision blurry - Has been progressively getting worse, has no other associated symptoms - Was using OTC readers eyeglasses to help but those are no longer working - Has Medicaid and would like to see either optometrist or ophthalmologist  Left knee pain - Stable, they are planning to get the shower chair today if able - Still waiting to hear back from PT  PERTINENT  PMH / PSH: Reviewed  OBJECTIVE:   BP 128/72   Pulse 90   Ht '5\' 2"'$  (1.575 m)   Wt 202 lb 6.4 oz (91.8 kg)   SpO2 98%   BMI 37.02 kg/m   Gen: well-appearing, NAD CV: RRR, no m/r/g appreciated, no peripheral edema Pulm: CTAB, no wheezes/crackles  ASSESSMENT/PLAN:   Hypertension BP 128/72 in office today.  Still deferring treatment at this time as measurements are typically only mildly elevated and seem well controlled on repeat measurements. - 66-monthfollow-up  Healthcare maintenance - GI referral for colonoscopy placed   Blurred vision Seems to be progressively worsening, no acute changes that have concern for an acute glaucoma at this time. - Ophthalmology resources given and ophthalmology referral placed  Dental problems - Patient given dental resources for Medicaid  ARise Patience [place]

## 2022-06-28 NOTE — Assessment & Plan Note (Signed)
-   GI referral for colonoscopy placed

## 2022-06-28 NOTE — Assessment & Plan Note (Signed)
BP 128/72 in office today.  Still deferring treatment at this time as measurements are typically only mildly elevated and seem well controlled on repeat measurements. - 29-monthfollow-up

## 2022-06-28 NOTE — Patient Instructions (Signed)
I have placed a referral to the GI doctor for the colonoscopy.  And I also see that [facility] excepted the referral for the physical therapy.  If you do not hear anything back from either these groups in the next 2 weeks, call the office and see what the status of the referral is.  They can typically help you with getting connected to someone to get it scheduled.  I placed a referral for the ophthalmologist, I have also provided you with a list of providers that typically accept Medicaid.  I have also given you the dentist as well.  Your blood pressure is mildly elevated but nothing that I want to do for your medications at this time.

## 2022-06-29 ENCOUNTER — Ambulatory Visit (INDEPENDENT_AMBULATORY_CARE_PROVIDER_SITE_OTHER): Payer: Medicaid Other | Admitting: Clinical

## 2022-06-29 DIAGNOSIS — Z7689 Persons encountering health services in other specified circumstances: Secondary | ICD-10-CM | POA: Diagnosis not present

## 2022-06-29 DIAGNOSIS — F330 Major depressive disorder, recurrent, mild: Secondary | ICD-10-CM | POA: Diagnosis not present

## 2022-06-29 NOTE — Progress Notes (Signed)
   THERAPIST PROGRESS NOTE  Session Time: 40 minutes  Participation Level: Active  Behavioral Response: CasualAlertEuthymic  Type of Therapy: Individual Therapy  Treatment Goals addressed: client will participate in 80% of scheduled individual psychotherapy sessions  ProgressTowards Goals: Progressing  Interventions: CBT and Supportive  Summary:  Anita Hill is a 51 y.o. female who presents for the scheduled appointment oriented x5, appropriately dressed, and friendly. Client denied hallucinations and delusions. Client reported on today she is doing pretty well. Client reported she had a visit with her PCP yesterday and her blood pressure is doing well. Client reported otherwise some conflictual conversations have been coming up at the house. Client reported her daughter and daughters fiance go back and forth about separating and/ or staying together. Client reported the dynamic in the house gets to be very awkward. Client reported her daughter mentioned placing her in group home if she leaves [place]. Client reported otherwise she has been working on goals with employment and education to keep herself busy. Client reported she has been anticipating seasoning depression towards the end of the year. Client reported her mother passed in august 2021 and the gloomy weather makes her sad. Client reported she is going to look for a different psychiatrist to get a second opinion on her diagnosis of schizophrenia diagnosis. Client reported she disagrees and does not want to take medications. Client reported she does not think taking the medications make a difference in her behaviors or mood.  Evidence of progress towards goal:  client reported 2 goals of either enrolling in school and/or working which she has started taking appropriate steps to engage with each.   Suicidal/Homicidal: Nowithout intent/plan  Therapist Response:  Therapist began the appointment asking the client how she  has been doing since he was last seen. Therapist used CBT to engage using active listening and positive emotional support. Therapist used CBT to engage and ask the client about stressors in the home that negatively affect her. Therapist used CBT to acknowledge the clients concerns with medication management and her diagnosis. Therapist used CBT to positively acknowledge her steps taken to engage in education and employment opportunity. Therapist used CBT to discuss behavioral skills to help manage depression. Therapist used CBT ask the client to identify her progress with frequency of use with coping skills with continued practice in her daily activity.    Therapist assigned the client homework to practice self care.     Plan: Return again in 5 weeks.  Diagnosis: major depressive, disorder, recurrent episode, mild  Collaboration of Care: Patient refused AEB none requested by the client at this time.  Patient/Guardian was advised Release of Information must be obtained prior to any record release in order to collaborate their care with an outside provider. Patient/Guardian was advised if they have not already done so to contact the registration department to sign all necessary forms in order for [place] to release information regarding their care.   Consent: Patient/Guardian gives verbal consent for treatment and assignment of benefits for services provided during this visit. Patient/Guardian expressed understanding and agreed to proceed.   [place], LCSW 06/29/2022

## 2022-07-05 NOTE — Plan of Care (Signed)
  Problem: Depression CCP Problem  1  Goal: LTG: Shakea WILL SCORE LESS THAN 10 ON THE PATIENT HEALTH QUESTIONNAIRE (PHQ-9) Outcome: Progressing Goal: STG: Gilberto WILL PARTICIPATE IN AT LEAST 80% OF SCHEDULED INDIVIDUAL PSYCHOTHERAPY SESSIONS Outcome: Progressing Goal: STG: Robi WILL COMPLETE AT LEAST 80% OF ASSIGNED HOMEWORK Outcome: Progressing   

## 2022-07-13 ENCOUNTER — Ambulatory Visit (HOSPITAL_COMMUNITY): Payer: Medicaid Other | Admitting: Clinical

## 2022-07-30 ENCOUNTER — Ambulatory Visit: Payer: Medicaid Other | Admitting: Podiatry

## 2022-08-02 ENCOUNTER — Encounter: Payer: Self-pay | Admitting: Podiatry

## 2022-08-02 ENCOUNTER — Ambulatory Visit (INDEPENDENT_AMBULATORY_CARE_PROVIDER_SITE_OTHER): Payer: Medicaid Other | Admitting: Podiatry

## 2022-08-02 DIAGNOSIS — B351 Tinea unguium: Secondary | ICD-10-CM

## 2022-08-02 DIAGNOSIS — M79609 Pain in unspecified limb: Secondary | ICD-10-CM | POA: Diagnosis not present

## 2022-08-02 DIAGNOSIS — M79676 Pain in unspecified toe(s): Secondary | ICD-10-CM

## 2022-08-02 NOTE — Progress Notes (Unsigned)
  Subjective:  Patient ID: Anita Hill, female    DOB: [birth year],  MRN: [id number]  Anita Hill presents to clinic today for:  Chief Complaint  Patient presents with   Nail Problem    Routine foot care PCP-[place] PCP Vst-07/2022    New problem(s): None. {jgcomplaint:23593}  PCP is Anita Hill , and last visit was  {Time; dates multiple:15870}.  Allergies  Allergen Reactions   Strawberry Extract Anaphylaxis   Risperidone And Related Hives, Nausea Only and Other (See Comments)    Made me feel jittery and restless/ Pt denies this allergy on 01/27/21   Latuda [Lurasidone Hcl] Other (See Comments)    Hot and cold flashes    Review of Systems: Negative except as noted in the HPI.  Objective: No changes noted in today's physical examination. Vascular Examination: Capillary refill time immediate b/l.Vascular status intact b/l with palpable pedal pulses. Pedal hair present b/l. No edema. No pain with calf compression b/l. Skin temperature gradient WNL b/l. {jgvascular:23595}  Neurological Examination: Sensation grossly intact b/l with 10 gram monofilament. Vibratory sensation intact b/l. {jgneuro:23601::"Protective sensation intact 5/5 intact bilaterally with 10g monofilament b/l.","Vibratory sensation intact b/l.","Proprioception intact bilaterally."}  Dermatological Examination: Pedal skin with normal turgor, texture and tone b/l. Toenails 1-5 b/l thick, discolored, elongated with subungual debris and pain on dorsal palpation. {jgderm:23598}  Musculoskeletal Examination: {jgmsk:23600}  Radiographs: None  {jgxrayfindings:23683}  Last A1c:      Latest Ref Rng & Units 03/22/2022    3:13 PM  Hemoglobin A1C  Hemoglobin-A1c 4.8 - 5.6 % 5.9     Anita Hill is a pleasant 51 y.o. female {jgbodyhabitus:24098} AAO x 3.  Assessment/Plan: 1. Pain due to onychomycosis of nail     No orders of the defined types were placed in this encounter.    {Jgplan:23602::"-Patient/POA to call should there be question/concern in the interim."}   Return in about 3 months (around 11/02/2022).  Anita Hill, DPM

## 2022-08-03 ENCOUNTER — Telehealth (HOSPITAL_COMMUNITY): Payer: Self-pay | Admitting: *Deleted

## 2022-08-03 ENCOUNTER — Ambulatory Visit (HOSPITAL_COMMUNITY): Payer: Medicaid Other

## 2022-08-03 NOTE — Telephone Encounter (Signed)
Call from patient, missed shot appt today, states her daughter over slept. SHe is rescheduled to come in tomorrow about 1145

## 2022-08-04 ENCOUNTER — Encounter (HOSPITAL_COMMUNITY): Payer: Self-pay

## 2022-08-04 ENCOUNTER — Ambulatory Visit (INDEPENDENT_AMBULATORY_CARE_PROVIDER_SITE_OTHER): Payer: Medicaid Other

## 2022-08-04 VITALS — BP 141/112 | HR 115 | Resp 14 | Ht 62.0 in | Wt 200.0 lb

## 2022-08-04 DIAGNOSIS — F17219 Nicotine dependence, cigarettes, with unspecified nicotine-induced disorders: Secondary | ICD-10-CM

## 2022-08-04 DIAGNOSIS — F411 Generalized anxiety disorder: Secondary | ICD-10-CM | POA: Diagnosis not present

## 2022-08-04 DIAGNOSIS — F5105 Insomnia due to other mental disorder: Secondary | ICD-10-CM

## 2022-08-04 DIAGNOSIS — F330 Major depressive disorder, recurrent, mild: Secondary | ICD-10-CM

## 2022-08-04 DIAGNOSIS — F29 Unspecified psychosis not due to a substance or known physiological condition: Secondary | ICD-10-CM

## 2022-08-04 DIAGNOSIS — F209 Schizophrenia, unspecified: Secondary | ICD-10-CM

## 2022-08-04 NOTE — Progress Notes (Signed)
PATIENT ARRIVED FOR haloperidol decanoate (HALDOL DECANOATE) 100 MG/ML injection 100 mg  WHICH IS OVERDUE  100 mg, Intramuscular, Every 30 days   TOLERATED INJECTION WELL IN RIGHT UPPER QUAD  & PLEASANT AS ALWAYS

## 2022-08-04 NOTE — Telephone Encounter (Signed)
She missed her appt this week and called to reschedule

## 2022-08-09 ENCOUNTER — Ambulatory Visit (HOSPITAL_COMMUNITY): Payer: Medicaid Other | Admitting: Clinical

## 2022-08-10 ENCOUNTER — Other Ambulatory Visit (HOSPITAL_COMMUNITY): Payer: Self-pay | Admitting: Registered Nurse

## 2022-08-10 ENCOUNTER — Telehealth (HOSPITAL_COMMUNITY): Payer: Self-pay | Admitting: *Deleted

## 2022-08-10 DIAGNOSIS — F411 Generalized anxiety disorder: Secondary | ICD-10-CM

## 2022-08-10 DIAGNOSIS — M5405 Panniculitis affecting regions of neck and back, thoracolumbar region: Secondary | ICD-10-CM | POA: Diagnosis not present

## 2022-08-10 DIAGNOSIS — F209 Schizophrenia, unspecified: Secondary | ICD-10-CM

## 2022-08-10 DIAGNOSIS — F5105 Insomnia due to other mental disorder: Secondary | ICD-10-CM

## 2022-08-10 MED ORDER — HALOPERIDOL DECANOATE 100 MG/ML IM SOLN: 100.0000 mg | INTRAMUSCULAR | 3 refills | Status: DC

## 2022-08-10 MED ORDER — DIVALPROEX SODIUM ER 500 MG PO TB24: 1000.0000 mg | ORAL_TABLET | Freq: Every day | ORAL | 3 refills | Status: DC

## 2022-08-10 MED ORDER — DOXEPIN HCL 100 MG PO CAPS: 100.0000 mg | ORAL_CAPSULE | Freq: Every day | ORAL | 3 refills | Status: DC

## 2022-08-10 MED ORDER — HYDROXYZINE PAMOATE 25 MG PO CAPS: 25.0000 mg | ORAL_CAPSULE | Freq: Three times a day (TID) | ORAL | 3 refills | Status: DC | PRN

## 2022-08-10 NOTE — Telephone Encounter (Signed)
[facility] called stated they need refills on patient's med's for"pill pack" -- Outpatient Medications  benztropine (COGENTIN) 0.5 MG tablet  divalproex (DEPAKOTE ER) 500 MG 24 hr tablet  doxepin (SINEQUAN) 100 MG capsule  haloperidol decanoate (HALDOL DECANOATE) 100 MG/ML injection hydrOXYzine (VISTARIL) 25 MG capsule meloxicam (MOBIC) 15 MG tablet

## 2022-08-19 ENCOUNTER — Ambulatory Visit (INDEPENDENT_AMBULATORY_CARE_PROVIDER_SITE_OTHER): Payer: Medicaid Other | Admitting: Clinical

## 2022-08-19 DIAGNOSIS — F330 Major depressive disorder, recurrent, mild: Secondary | ICD-10-CM

## 2022-08-19 DIAGNOSIS — Z7689 Persons encountering health services in other specified circumstances: Secondary | ICD-10-CM | POA: Diagnosis not present

## 2022-08-19 NOTE — Progress Notes (Signed)
   THERAPIST PROGRESS NOTE  Session Time: 40 minutes  Participation Level: Active  Behavioral Response: CasualAlertEuthymic  Type of Therapy: Individual Therapy  Treatment Goals addressed: client will participate in at least 80% of scheduled psychotherapy appointments  ProgressTowards Goals: Progressing  Interventions: CBT  Summary:  Anita Hill is a 51 y.o. female who presents for the scheduled appointment oriented times five, appropriately dressed, and friendly. Client denied hallucinations and delusions. Client reported she has been doing fairly well. Client reported the jobs she previously attempted applying for did not go through. Client reported she is going to continue to look. Client reported she has applied to engage in the [place] computer education program for adults and will start that next week. Client reported she is also waiting to hear back about the GED program. Client reported she has things to help keep her busy and out of the house. Client reported at home she does her best to coexist with her daughter. Client reported she and her daughter do not have a good relationship to no fault of her own. Client reported her daughter has a short temper and yells at her. Client reported with her daughter she listens to her but she does not internalize what she says. Client reported her daughter handles her money and takes out her portion for rent. Client reported it worries her that her daughter does not give her receipt of how much she is contributing.  Evidence of progress towards goal:  client reported she has accomplished 1 goals of getting involved with community resource for Paramedic. Client reported she rides her bike for exercise at least 5 days per week.  Suicidal/Homicidal: Nowithout intent/plan  Therapist Response:  Therapist began the appointment asking the client how she has been doing since last seen. Therapist used CBT to engage using active  listening and positive emotional support. Therapist used CBT to ask the client about positive changes with employment and education opportunities. Therapist used CBT to positively reinforce the clients engagement with her own goals. Therapist used CBT ask the client to identify her progress with frequency of use with coping skills with continued practice in her daily activity.    Therapist assigned the client homework to practice self care.   Plan: Return again in 4 weeks.  Diagnosis: MDD, recurrent episode, mild  Collaboration of Care: Patient refused AEB none requested by the client.  Patient/Guardian was advised Release of Information must be obtained prior to any record release in order to collaborate their care with an outside provider. Patient/Guardian was advised if they have not already done so to contact the registration department to sign all necessary forms in order for [place] to release information regarding their care.   Consent: Patient/Guardian gives verbal consent for treatment and assignment of benefits for services provided during this visit. Patient/Guardian expressed understanding and agreed to proceed.   [place], LCSW 08/19/2022

## 2022-08-21 NOTE — Plan of Care (Signed)
  Problem: Depression CCP Problem  1  Goal: LTG: Cherity WILL SCORE LESS THAN 10 ON THE PATIENT HEALTH QUESTIONNAIRE (PHQ-9) Outcome: Progressing Goal: STG: Tannis WILL PARTICIPATE IN AT LEAST 80% OF SCHEDULED INDIVIDUAL PSYCHOTHERAPY SESSIONS Outcome: Progressing Goal: STG: Davida WILL COMPLETE AT LEAST 80% OF ASSIGNED HOMEWORK Outcome: Progressing   

## 2022-09-02 ENCOUNTER — Encounter (HOSPITAL_COMMUNITY): Payer: Self-pay

## 2022-09-02 ENCOUNTER — Ambulatory Visit (HOSPITAL_COMMUNITY): Payer: Medicaid Other

## 2022-09-02 ENCOUNTER — Ambulatory Visit (INDEPENDENT_AMBULATORY_CARE_PROVIDER_SITE_OTHER): Payer: Medicaid Other | Admitting: Psychiatry

## 2022-09-02 VITALS — BP 149/108 | HR 93 | Resp 14 | Ht 62.0 in | Wt 203.4 lb

## 2022-09-02 DIAGNOSIS — F411 Generalized anxiety disorder: Secondary | ICD-10-CM | POA: Diagnosis not present

## 2022-09-02 DIAGNOSIS — F5105 Insomnia due to other mental disorder: Secondary | ICD-10-CM

## 2022-09-02 DIAGNOSIS — F209 Schizophrenia, unspecified: Secondary | ICD-10-CM

## 2022-09-02 DIAGNOSIS — F17219 Nicotine dependence, cigarettes, with unspecified nicotine-induced disorders: Secondary | ICD-10-CM

## 2022-09-02 DIAGNOSIS — F330 Major depressive disorder, recurrent, mild: Secondary | ICD-10-CM

## 2022-09-02 DIAGNOSIS — F29 Unspecified psychosis not due to a substance or known physiological condition: Secondary | ICD-10-CM

## 2022-09-02 MED ORDER — HYDROXYZINE PAMOATE 25 MG PO CAPS: 25.0000 mg | ORAL_CAPSULE | Freq: Three times a day (TID) | ORAL | 3 refills | Status: DC | PRN

## 2022-09-02 MED ORDER — BENZTROPINE MESYLATE 0.5 MG PO TABS: 0.5000 mg | ORAL_TABLET | Freq: Three times a day (TID) | ORAL | 0 refills | Status: DC | PRN

## 2022-09-02 MED ORDER — DOXEPIN HCL 100 MG PO CAPS: 100.0000 mg | ORAL_CAPSULE | Freq: Every day | ORAL | 3 refills | Status: DC

## 2022-09-02 MED ORDER — HALOPERIDOL DECANOATE 100 MG/ML IM SOLN: 100.0000 mg | INTRAMUSCULAR | 3 refills | Status: DC

## 2022-09-02 MED ORDER — DIVALPROEX SODIUM ER 500 MG PO TB24: 1000.0000 mg | ORAL_TABLET | Freq: Every day | ORAL | 3 refills | Status: DC

## 2022-09-02 MED ORDER — NICOTINE 14 MG/24HR TD PT24: 14.0000 mg | MEDICATED_PATCH | Freq: Every morning | TRANSDERMAL | 3 refills | Status: DC

## 2022-09-02 MED ORDER — BENZTROPINE MESYLATE 1 MG PO TABS: 2.0000 mg | ORAL_TABLET | Freq: Two times a day (BID) | ORAL | 3 refills | Status: DC

## 2022-09-02 NOTE — Progress Notes (Signed)
PATIENT ARRIVED FOR haloperidol decanoate (HALDOL DECANOATE) 100 MG/ML injection 100 mg  WHICH IS OVERDUE  100 mg, Intramuscular, Every 30 days   TOLERATED INJECTION WELL IN LEFT UPPER QUAD  & PLEASANT AS ALWAYS 

## 2022-09-02 NOTE — Progress Notes (Signed)
[place] MD/PA/NP OP Progress Note  09/02/2022 9:56 AM Anita Hill  MRN:  [id number]  Chief Complaint: "AT times I get stiff"  HPI: 51 year old female seen today for follow-up psychiatric evaluation.  She has a psychiatric history of schizophrenia, insomnia, anxiety, nicotine dependence, bipolar 1 disorder, and alcohol induced mood disorder.  Currently she is managed on Depakote 1000 mg nightly, doxepin 100 mg nightly, Haldol 100 mg injection every 28 days, Cogentin 0.5 mg 3 times daily, hydroxyzine 25 mg 3 times daily, and NicoDerm CQ 14 mg patches.  She reports her medications are somewhat effective in managing her psychiatric condition.  Today she is well-groomed, pleasant, cooperative, engaged in conversation, maintained eye contact.  She informed writer that at times her body becomes stiff.  She notes that she has abnormal muscle movements in her fingers and leg.  Provider conducted an aims assessment and patient scored a 3.  Patient reports that her mood is stable and notes that she has minimal anxiety and depression.  Provider conducted a GAD-7 and patient scored a 3.  Provider also conducted PHQ-9 patient scored a 4.  She endorses sleeping approximately 9 hours nightly.  Patient notes that she has an adequate appetite.  Since being on Haldol she notes that she has gained 60 pounds.  She notes that she has gained 10 pounds within the last 3 months.  Today she denies SI/HI/VAH, mania, paranoia.  Patient reports that she has hip pain.  She notes that she follows up regularly with her primary care doctor regarding this.  Patient informed Probation officer that she stopped her NicoDerm patch but reports that she would like to restart if she is smoking 2 packs of cigarettes a day.  Today Cogentin increased to 0.5 mg 3 times daily to 1 mg twice daily to help manage symptoms of TD.  NicoDerm CQ 14 mg patches restarted to help manage tobacco dependence.  She will continue her other medications as prescribed.  No  other concerns at this time. Visit Diagnosis:    ICD-10-CM   1. Schizophrenia, unspecified type ([place])  F20.9 divalproex (DEPAKOTE ER) 500 MG 24 hr tablet    doxepin (SINEQUAN) 100 MG capsule    haloperidol decanoate (HALDOL DECANOATE) 100 MG/ML injection    benztropine (COGENTIN) 1 MG tablet    DISCONTINUED: benztropine (COGENTIN) 0.5 MG tablet    2. Insomnia due to mental condition  F51.05 doxepin (SINEQUAN) 100 MG capsule    3. Anxiety state  F41.1 hydrOXYzine (VISTARIL) 25 MG capsule    4. Cigarette nicotine dependence with nicotine-induced disorder  F17.219 nicotine (NICODERM CQ - DOSED IN MG/24 HOURS) 14 mg/24hr patch      Past Psychiatric History: schizophrenia, insomnia, anxiety, nicotine dependence, bipolar 1 disorder, and alcohol induced mood disorder.  Past Medical History:  Past Medical History:  Diagnosis Date   BIPOLAR DISORDER    Chest pain 01/20/2016   Delusions ([place])    Dyspnea 10/13/2012   Homelessness    Left ventricular hypertrophy    Palpitations    Psychosis ([place])    Sickle cell trait ([place])    Substance abuse ([place])     Past Surgical History:  Procedure Laterality Date   BUNIONECTOMY      Family Psychiatric History: none reported   Family History:  Family History  Problem Relation Age of Onset   Heart disease Mother        CHF   Prostate cancer Father     Social History:  Social History  Socioeconomic History   Marital status: Single    Spouse name: Not on file   Number of children: 2   Years of education: Not on file   Highest education level: Not on file  Occupational History    Comment: Unemployed  Tobacco Use   Smoking status: Every Day    Packs/day: 1.00    Years: 10.00    Total pack years: 10.00    Types: Cigarettes, Cigars   Smokeless tobacco: Never  Vaping Use   Vaping Use: Never used  Substance and Sexual Activity   Alcohol use: Yes    Comment: everyday 20-80oz   Drug use: No    Comment: Previous cocaine, heroin,  marijuana   Sexual activity: Not Currently  Other Topics Concern   Not on file  Social History Narrative   Not on file   Social Determinants of Health   Financial Resource Strain: Low Risk  (07/15/2021)   Overall Financial Resource Strain (CARDIA)    Difficulty of Paying Living Expenses: Not hard at all  Food Insecurity: No Food Insecurity (07/15/2021)   Hunger Vital Sign    Worried About Running Out of Food in the Last Year: Never true    Ran Out of Food in the Last Year: Never true  Transportation Needs: No Transportation Needs (07/15/2021)   PRAPARE - Hydrologist (Medical): No    Lack of Transportation (Non-Medical): No  Physical Activity: Not on file  Stress: Not on file  Social Connections: Not on file    Allergies:  Allergies  Allergen Reactions   Strawberry Extract Anaphylaxis   Risperidone And Related Hives, Nausea Only and Other (See Comments)    Made me feel jittery and restless/ Pt denies this allergy on 01/27/21   Latuda [Lurasidone Hcl] Other (See Comments)    Hot and cold flashes    Metabolic Disorder Labs: Lab Results  Component Value Date   HGBA1C 5.9 (H) 03/22/2022   MPG 125.5 05/08/2021   Lab Results  Component Value Date   PROLACTIN 2.6 (L) 05/08/2021   Lab Results  Component Value Date   CHOL 213 (H) 03/22/2022   TRIG 250 (H) 03/22/2022   HDL 67 03/22/2022   CHOLHDL 3.2 03/22/2022   VLDL 13 05/08/2021   LDLCALC 104 (H) 03/22/2022   LDLCALC 78 05/08/2021   Lab Results  Component Value Date   TSH 3.290 03/22/2022   TSH 3.006 05/08/2021    Therapeutic Level Labs: No results found for: "LITHIUM" Lab Results  Component Value Date   VALPROATE 78 05/31/2022   VALPROATE <4 (L) 03/22/2022   Lab Results  Component Value Date   CBMZ <2.0 (L) 01/08/2008    Current Medications: Current Outpatient Medications  Medication Sig Dispense Refill   benztropine (COGENTIN) 1 MG tablet Take 2 tablets (2 mg total) by  mouth 2 (two) times daily. Dose increased please DC cogentin 0.5 mg three times daily. 60 tablet 3   divalproex (DEPAKOTE ER) 500 MG 24 hr tablet Take 2 tablets (1,000 mg total) by mouth daily at 10 pm. 60 tablet 3   doxepin (SINEQUAN) 100 MG capsule Take 1 capsule (100 mg total) by mouth at bedtime. 30 capsule 3   haloperidol decanoate (HALDOL DECANOATE) 100 MG/ML injection Inject 1 mL (100 mg total) into the muscle every 30 (thirty) days. 1 mL 3   hydrOXYzine (VISTARIL) 25 MG capsule Take 1 capsule (25 mg total) by mouth 3 (three) times daily as needed.  60 capsule 3   meloxicam (MOBIC) 15 MG tablet Take 0.5-1 tablets (7.5-15 mg total) by mouth daily as needed for pain. 30 tablet 6   metoprolol succinate (TOPROL-XL) 25 MG 24 hr tablet TAKE 1 TABLET(25 MG) BY MOUTH DAILY 30 tablet 3   nicotine (NICODERM CQ - DOSED IN MG/24 HOURS) 14 mg/24hr patch Place 1 patch (14 mg total) onto the skin every morning. 14 patch 3   Semaglutide-Weight Management 0.25 MG/0.5ML SOAJ Inject 0.25 mg into the skin once a week. 2 mL 1   Current Facility-Administered Medications  Medication Dose Route Frequency Provider Last Rate Last Admin   haloperidol decanoate (HALDOL DECANOATE) 100 MG/ML injection 100 mg  100 mg Intramuscular Q30 days Lucky Rathke, FNP   100 mg at 08/04/22 1427     Musculoskeletal: Strength & Muscle Tone: within normal limits Gait & Station: normal Patient leans: N/A  Psychiatric Specialty Exam: Review of Systems  There were no vitals taken for this visit.There is no height or weight on file to calculate BMI.  General Appearance: Well Groomed  Eye Contact:  Good  Speech:  Clear and Coherent and Normal Rate  Volume:  Normal  Mood:  Euthymic  Affect:  Appropriate and Congruent  Thought Process:  Coherent, Goal Directed, and Linear  Orientation:  Full (Time, Place, and Person)  Thought Content: WDL and Logical   Suicidal Thoughts:  No  Homicidal Thoughts:  No  Memory:  Immediate;    Good Recent;   Good Remote;   Good  Judgement:  Good  Insight:  Good  Psychomotor Activity:  TD  Concentration:  Concentration: Good and Attention Span: Good  Recall:  Good  Fund of Knowledge: Good  Language: Good  Akathisia:  No  Handed:  Right  AIMS (if indicated): done, 3  Assets:  Communication Skills Desire for Improvement Financial Resources/Insurance Housing Leisure Time Physical Health Social Support  ADL's:  Intact  Cognition: WNL  Sleep:  Good   Screenings: [place] Office Visit from 09/02/2022 in [facility] Office Visit from 04/08/2022 in [facility] [place] Office Visit from 02/09/2022 in [facility] Admission (Discharged) from 05/09/2021 in [facility]  AIMS Total Score 3 0 0 0      AUDIT    Flowsheet Row Admission (Discharged) from 05/09/2021 in [facility]  Alcohol Use Disorder Identification Test Final Score (AUDIT) 5      GAD-7    Flowsheet Row Office Visit from 09/02/2022 in [facility] Office Visit from 04/08/2022 in [facility]  Total GAD-7 Score 3 0      PHQ2-9    [place] Office Visit from 09/02/2022 in [facility] Office Visit from 06/28/2022 in [place] Office Visit from 06/07/2022 in [place] Office Visit from 04/08/2022 in [facility] Office Visit from 02/09/2022 in [facility]  PHQ-2 Total Score 0 2 0 0 0  PHQ-9 Total Score '4 7 4 '$ -- --      [place] Office Visit from 04/08/2022 in [facility] Counselor from 11/17/2021 in [facility] ED from 07/01/2021 in [place] DEPT  C-SSRS RISK CATEGORY No Risk No Risk No  Risk        Assessment and  Plan: Patient endorses symptoms of TD and tobacco dependence.  She reports her mood is stable and denies symptoms of anxiety, depression, or psychosis.  Today Cogentin increased to 0.5 mg 3 times daily to 1 mg twice daily to help manage symptoms of TD.  NicoDerm CQ 14 mg patches restarted to help manage tobacco dependence.    1. Schizophrenia, unspecified type ([place])  Continue- divalproex (DEPAKOTE ER) 500 MG 24 hr tablet; Take 2 tablets (1,000 mg total) by mouth daily at 10 pm.  Dispense: 60 tablet; Refill: 3 Continue- doxepin (SINEQUAN) 100 MG capsule; Take 1 capsule (100 mg total) by mouth at bedtime.  Dispense: 30 capsule; Refill: 3 Continue- haloperidol decanoate (HALDOL DECANOATE) 100 MG/ML injection; Inject 1 mL (100 mg total) into the muscle every 30 (thirty) days.  Dispense: 1 mL; Refill: 3 Increase- benztropine (COGENTIN) 1 MG tablet; Take 2 tablets (2 mg total) by mouth 2 (two) times daily. Dose increased please DC cogentin 0.5 mg three times daily.  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  Dispense: 30 capsule; Refill: 3  3. Anxiety state  Continue- hydrOXYzine (VISTARIL) 25 MG capsule; Take 1 capsule (25 mg total) by mouth 3 (three) times daily as needed.  Dispense: 60 capsule; Refill: 3  4. Cigarette nicotine dependence with nicotine-induced disorder  Restart- nicotine (NICODERM CQ - DOSED IN MG/24 HOURS) 14 mg/24hr patch; Place 1 patch (14 mg total) onto the skin every morning.  Dispense: 14 patch; Refill: 3   Collaboration of Care: Collaboration of Care: Other provider involved in patient's care AEB PCP and shot clinic staff  Patient/Guardian was advised Release of Information must be obtained prior to any record release in order to collaborate their care with an outside provider. Patient/Guardian was advised if they have  not already done so to contact the registration department to sign all necessary forms in order for [place] to release information regarding their care.   Consent: Patient/Guardian gives verbal consent for treatment and assignment of benefits for services provided during this visit. Patient/Guardian expressed understanding and agreed to proceed.   Follow-up in 3 months   Salley Slaughter, NP 09/02/2022, 9:56 AM

## 2022-09-06 ENCOUNTER — Ambulatory Visit: Payer: Self-pay | Admitting: *Deleted

## 2022-09-06 VITALS — Ht 62.0 in | Wt 208.0 lb

## 2022-09-06 DIAGNOSIS — Z7689 Persons encountering health services in other specified circumstances: Secondary | ICD-10-CM | POA: Diagnosis not present

## 2022-09-06 DIAGNOSIS — Z1211 Encounter for screening for malignant neoplasm of colon: Secondary | ICD-10-CM

## 2022-09-06 NOTE — Progress Notes (Signed)
No egg or soy allergy known to patient  No issues known to pt with past sedation with any surgeries or procedures Patient denies ever being told they had issues or difficulty with intubation  No FH of Malignant Hyperthermia Pt is not on diet pills Pt is not on  home 02  Pt is not on blood thinners  Pt denies issues with constipation  No A fib or A flutter Have any cardiac testing pending--NO Pt instructed to use Singlecare.com or GoodRx for a price reduction on prep    Patient's chart reviewed by Osvaldo Angst CNRA prior to previsit and patient appropriate for the [place].  Previsit completed and red dot placed by patient's name on their procedure day (on provider's schedule).     Sample of plenvu given.

## 2022-09-07 ENCOUNTER — Ambulatory Visit (INDEPENDENT_AMBULATORY_CARE_PROVIDER_SITE_OTHER): Payer: Medicaid Other | Admitting: Clinical

## 2022-09-07 DIAGNOSIS — Z7689 Persons encountering health services in other specified circumstances: Secondary | ICD-10-CM | POA: Diagnosis not present

## 2022-09-07 DIAGNOSIS — F209 Schizophrenia, unspecified: Secondary | ICD-10-CM | POA: Diagnosis not present

## 2022-09-07 NOTE — Plan of Care (Signed)
  Problem: Depression CCP Problem  1  Goal: LTG: [place] WILL SCORE LESS THAN 10 ON THE PATIENT HEALTH QUESTIONNAIRE (PHQ-9) Outcome: Progressing Goal: STG: [place] WILL PARTICIPATE IN AT LEAST 80% OF SCHEDULED INDIVIDUAL PSYCHOTHERAPY SESSIONS Outcome: Progressing Goal: STG: [place] WILL COMPLETE AT LEAST 80% OF ASSIGNED HOMEWORK Outcome: Progressing

## 2022-09-07 NOTE — Progress Notes (Signed)
   THERAPIST PROGRESS NOTE  Session Time: 20 minutes  Participation Level: Active  Behavioral Response: CasualAlertEuthymic  Type of Therapy: Individual Therapy  Treatment Goals addressed: Client will complete at least 80% of assigned homework  ProgressTowards Goals: Progressing  Interventions: CBT and Supportive  Summary:  Anita Hill is a 51 y.o. female who presents for scheduled appointment oriented x5, appropriately dressed, and friendly.  Client denies hallucinations and delusions. Client reported today she is going well but feeling tired.  Client reported she stayed up too late last night.  Client reported things have been about the same since she was last seen.  Client reported this week she will schedule a date to walk into [employer] to start the computer literacy classes.  Client reported she is still putting in applications for some part-time jobs that she can do.  Client reported her daughter was talking to her about how fast that she will apply and next year she wants them to both be on the same page with going after goals that they would like to accomplish.  Client reported no other complaints at this time. Evidence of progress towards goal: Client reported engaging in positive coping skills to 7 out of 7 days a week.  In following through on at least 1 goal of attending continuing education and training through community resources.  Suicidal/Homicidal: Nowithout intent/plan  Therapist Response:  Therapist began the appointment asking client how she has been doing since last seen. Therapist used CBT to engage using active listening and positive emotional support. Therapist used CBT to ask the client how she is doing with following up on previously mentioned goals that she had for herself related to work and additional learning opportunities. Therapist used CBT to acknowledge the clients efforts and positive reinforcement to her follow-through. Therapist used CBT ask the  client to identify her progress with frequency of use with coping skills with continued practice in her daily activity.    Therapist assigned client homework to continue practicing self-care.   Plan: Return again in 2 weeks.  Diagnosis: Schizophrenia, unspecified  Collaboration of Care: Patient refused AEB none requested by the client at this time.  Patient/Guardian was advised Release of Information must be obtained prior to any record release in order to collaborate their care with an outside provider. Patient/Guardian was advised if they have not already done so to contact the registration department to sign all necessary forms in order for [place] to release information regarding their care.   Consent: Patient/Guardian gives verbal consent for treatment and assignment of benefits for services provided during this visit. Patient/Guardian expressed understanding and agreed to proceed.   [place], LCSW 09/07/2022

## 2022-09-21 ENCOUNTER — Ambulatory Visit (INDEPENDENT_AMBULATORY_CARE_PROVIDER_SITE_OTHER): Payer: Medicaid Other | Admitting: Clinical

## 2022-09-21 DIAGNOSIS — F330 Major depressive disorder, recurrent, mild: Secondary | ICD-10-CM | POA: Diagnosis not present

## 2022-09-21 DIAGNOSIS — Z7689 Persons encountering health services in other specified circumstances: Secondary | ICD-10-CM | POA: Diagnosis not present

## 2022-09-23 NOTE — Progress Notes (Signed)
   THERAPIST PROGRESS NOTE  Session Time: 40 minutes  Participation Level: Active  Behavioral Response: CasualAlertEuthymic  Type of Therapy: Individual Therapy  Treatment Goals addressed: Client will complete 80% of assigned homework  ProgressTowards Goals: Progressing  Interventions: CBT and Supportive  Summary:  Anita Hill is a 51 y.o. female who presents for the scheduled appointment oriented times five, appropriately dressed, and friendly. Client denied hallucinations and delusions. Client reported on today she is doing okay. Client reported she is preparing to have a colonoscopy done soon. Client reported her daughter decided to cancel the trip to [place] for their birthdays. Client reported she is not sure why her daughter cancelled it. Client reported she went to [employer] to inquire about their computer classes. Client reported they changed it from in person instruction to a self paced self taught instruction through video. Client reported she has not yet had a success with finding a job but still working on that. Client reported she and her daughter have plans to spend thanksgiving with her daughter friends. Client reported being around a crowd of people is overwhelming. Client reported she is working on not being guarded around people and trying to relax. Client reported she has been thinking about how to make some pocket money to buy her daughter a Medical sales representative gift this year. Client reported she has been trying to figure out how to socialize and make female friends. Client reported her males friends tend to cross the line sometimes they cross the line with her. Client reported otherwise her blood pressure has been consistently high and weight gain has been a issue. Client reported she is working with her PCP to evaluate her medications. Evidence of progress towards goal:  client reported she is using positive coping skills at least 5x per week.  Suicidal/Homicidal: Nowithout  intent/plan  Therapist Response:  Therapist began the appointment asking the client how she has been doing since last seen. Therapist used CBT to engage using active listening and positive emotional support. Therapist used CBT to engage asking the client about updates with work, family, health, and occupational. Therapist used CBT to positively reinforce the clients use of problem solving her barriers. Therapist used CBT ask the client to identify her progress with frequency of use with coping skills with continued practice in her daily activity.    Therapist assigned the client homework to practice self care.    Plan: Return again in 3 weeks.  Diagnosis: MDD, recurrent episode, mild  Collaboration of Care: Patient refused AEB none requested by the client.  Patient/Guardian was advised Release of Information must be obtained prior to any record release in order to collaborate their care with an outside provider. Patient/Guardian was advised if they have not already done so to contact the registration department to sign all necessary forms in order for [place] to release information regarding their care.   Consent: Patient/Guardian gives verbal consent for treatment and assignment of benefits for services provided during this visit. Patient/Guardian expressed understanding and agreed to proceed.   [place], LCSW 09/21/2022

## 2022-09-23 NOTE — Plan of Care (Signed)
  Problem: Depression CCP Problem  1  Goal: LTG: [place] WILL SCORE LESS THAN 10 ON THE PATIENT HEALTH QUESTIONNAIRE (PHQ-9) Outcome: Progressing Goal: STG: [place] WILL PARTICIPATE IN AT LEAST 80% OF SCHEDULED INDIVIDUAL PSYCHOTHERAPY SESSIONS Outcome: Progressing Goal: STG: [place] WILL COMPLETE AT LEAST 80% OF ASSIGNED HOMEWORK Outcome: Progressing

## 2022-09-27 ENCOUNTER — Encounter: Payer: Self-pay | Admitting: Gastroenterology

## 2022-09-27 ENCOUNTER — Telehealth: Payer: Self-pay | Admitting: Gastroenterology

## 2022-09-27 NOTE — Telephone Encounter (Signed)
Good Morning Dr. Tarri Glenn,   I called this patient and nobody answered. I left a message that if she was running late or needed to reschedule to call us back.  I will NO SHOW patient.

## 2022-09-30 ENCOUNTER — Other Ambulatory Visit (HOSPITAL_COMMUNITY): Payer: Self-pay | Admitting: Psychiatry

## 2022-09-30 ENCOUNTER — Telehealth (HOSPITAL_COMMUNITY): Payer: Self-pay | Admitting: *Deleted

## 2022-09-30 ENCOUNTER — Ambulatory Visit (HOSPITAL_COMMUNITY): Payer: Medicaid Other

## 2022-09-30 DIAGNOSIS — F209 Schizophrenia, unspecified: Secondary | ICD-10-CM

## 2022-09-30 MED ORDER — BENZTROPINE MESYLATE 1 MG PO TABS: 2.0000 mg | ORAL_TABLET | Freq: Two times a day (BID) | ORAL | 3 refills | Status: DC

## 2022-09-30 MED ORDER — BENZTROPINE MESYLATE 1 MG PO TABS: 1.0000 mg | ORAL_TABLET | Freq: Two times a day (BID) | ORAL | 3 refills | Status: DC

## 2022-09-30 NOTE — Telephone Encounter (Signed)
Rx Refill Request benztropine (COGENTIN) 1 MG tablet  Last filled 09/27/22

## 2022-09-30 NOTE — Telephone Encounter (Signed)
Medication refilled and sent to preferred pharmacy

## 2022-10-05 ENCOUNTER — Ambulatory Visit (HOSPITAL_COMMUNITY): Payer: Medicaid Other | Admitting: Clinical

## 2022-10-08 ENCOUNTER — Ambulatory Visit: Payer: Medicaid Other | Admitting: Family Medicine

## 2022-10-13 DIAGNOSIS — Z7689 Persons encountering health services in other specified circumstances: Secondary | ICD-10-CM | POA: Diagnosis not present

## 2022-10-14 ENCOUNTER — Encounter: Payer: Self-pay | Admitting: Gastroenterology

## 2022-10-14 ENCOUNTER — Ambulatory Visit (AMBULATORY_SURGERY_CENTER): Payer: Medicaid Other | Admitting: Gastroenterology

## 2022-10-14 VITALS — BP 137/100 | HR 115 | Temp 96.0°F | Resp 23 | Ht 62.0 in | Wt 208.0 lb

## 2022-10-14 DIAGNOSIS — D123 Benign neoplasm of transverse colon: Secondary | ICD-10-CM

## 2022-10-14 DIAGNOSIS — D124 Benign neoplasm of descending colon: Secondary | ICD-10-CM | POA: Diagnosis not present

## 2022-10-14 DIAGNOSIS — D122 Benign neoplasm of ascending colon: Secondary | ICD-10-CM | POA: Diagnosis not present

## 2022-10-14 DIAGNOSIS — Z1211 Encounter for screening for malignant neoplasm of colon: Secondary | ICD-10-CM | POA: Diagnosis not present

## 2022-10-14 DIAGNOSIS — F319 Bipolar disorder, unspecified: Secondary | ICD-10-CM | POA: Diagnosis not present

## 2022-10-14 DIAGNOSIS — Z7689 Persons encountering health services in other specified circumstances: Secondary | ICD-10-CM | POA: Diagnosis not present

## 2022-10-14 DIAGNOSIS — F209 Schizophrenia, unspecified: Secondary | ICD-10-CM | POA: Diagnosis not present

## 2022-10-14 DIAGNOSIS — I10 Essential (primary) hypertension: Secondary | ICD-10-CM | POA: Diagnosis not present

## 2022-10-14 MED ORDER — SODIUM CHLORIDE 0.9 % IV SOLN: 500.0000 mL | Freq: Once | INTRAVENOUS | Status: DC

## 2022-10-14 NOTE — Progress Notes (Signed)
Report to PACU, RN, vss, BBS= Clear.  

## 2022-10-14 NOTE — Patient Instructions (Signed)
Thank you for letting us take care of your healthcare needs today. Please see handouts given to you on Polyps. You may resume your medications.     YOU HAD AN ENDOSCOPIC PROCEDURE TODAY AT [facility]:   Refer to the procedure report that was given to you for any specific questions about what was found during the examination.  If the procedure report does not answer your questions, please call your gastroenterologist to clarify.  If you requested that your care partner not be given the details of your procedure findings, then the procedure report has been included in a sealed envelope for you to review at your convenience later.  YOU SHOULD EXPECT: Some feelings of bloating in the abdomen. Passage of more gas than usual.  Walking can help get rid of the air that was put into your GI tract during the procedure and reduce the bloating. If you had a lower endoscopy (such as a colonoscopy or flexible sigmoidoscopy) you may notice spotting of blood in your stool or on the toilet paper. If you underwent a bowel prep for your procedure, you may not have a normal bowel movement for a few days.  Please Note:  You might notice some irritation and congestion in your nose or some drainage.  This is from the oxygen used during your procedure.  There is no need for concern and it should clear up in a day or so.  SYMPTOMS TO REPORT IMMEDIATELY:  Following lower endoscopy (colonoscopy or flexible sigmoidoscopy):  Excessive amounts of blood in the stool  Significant tenderness or worsening of abdominal pains  Swelling of the abdomen that is new, acute  Fever of 100F or higher  For urgent or emergent issues, a gastroenterologist can be reached at any hour by calling [phone]. Do not use MyChart messaging for urgent concerns.    DIET:  We do recommend a small meal at first, but then you may proceed to your regular diet.  Drink plenty of fluids but you should avoid alcoholic beverages  for 24 hours.  ACTIVITY:  You should plan to take it easy for the rest of today and you should NOT DRIVE or use heavy machinery until tomorrow (because of the sedation medicines used during the test).    FOLLOW UP: Our staff will call the number listed on your records the next business day following your procedure.  We will call around 7:15- 8:00 am to check on you and address any questions or concerns that you may have regarding the information given to you following your procedure. If we do not reach you, we will leave a message.     If any biopsies were taken you will be contacted by phone or by letter within the next 1-3 weeks.  Please call us at [phone] if you have not heard about the biopsies in 3 weeks.    SIGNATURES/CONFIDENTIALITY: You and/or your care partner have signed paperwork which will be entered into your electronic medical record.  These signatures attest to the fact that that the information above on your After Visit Summary has been reviewed and is understood.  Full responsibility of the confidentiality of this discharge information lies with you and/or your care-partner.

## 2022-10-14 NOTE — Progress Notes (Signed)
Called to room to assist during endoscopic procedure.  Patient ID and intended procedure confirmed with present staff. Received instructions for my participation in the procedure from the performing physician.  

## 2022-10-14 NOTE — Progress Notes (Signed)
Pt's states no medical or surgical changes since previsit or office visit. 

## 2022-10-14 NOTE — Op Note (Signed)
[facility] Patient Name: Anita Hill Procedure Date: 10/14/2022 11:31 AM MRN: [id number] Endoscopist: Thornton Park MD, MD, [id number] Age: 51 Referring MD:  Date of Birth: [birth year] Gender: Female Account #: [id number] Procedure:                Colonoscopy Indications:              Screening for colorectal malignant neoplasm, This                            is the patient's first colonoscopy Medicines:                Monitored Anesthesia Care Procedure:                Pre-Anesthesia Assessment:                           - Prior to the procedure, a History and Physical                            was performed, and patient medications and                            allergies were reviewed. The patient's tolerance of                            previous anesthesia was also reviewed. The risks                            and benefits of the procedure and the sedation                            options and risks were discussed with the patient.                            All questions were answered, and informed consent                            was obtained. Prior Anticoagulants: The patient has                            taken no anticoagulant or antiplatelet agents. ASA                            Grade Assessment: II - A patient with mild systemic                            disease. After reviewing the risks and benefits,                            the patient was deemed in satisfactory condition to                            undergo the procedure.  After obtaining informed consent, the colonoscope                            was passed under direct vision. Throughout the                            procedure, the patient's blood pressure, pulse, and                            oxygen saturations were monitored continuously. The                            Olympus CF-HQ190L (Serial# [id number]) Colonoscope was                            introduced  through the anus and advanced to the 3                            cm into the ileum. The colonoscopy was performed                            without difficulty. The patient tolerated the                            procedure well. The quality of the bowel                            preparation was good. The terminal ileum, ileocecal                            valve, appendiceal orifice, and rectum were                            photographed. Scope In: 12:31:15 PM Scope Out: 12:47:29 PM Scope Withdrawal Time: 0 hours 12 minutes 59 seconds  Total Procedure Duration: 0 hours 16 minutes 14 seconds  Findings:                 The perianal and digital rectal examinations were                            normal.                           A 2 mm polyp was found in the descending colon. The                            polyp was sessile. The polyp was removed with a                            cold snare. Resection and retrieval were complete.                            Estimated blood loss was minimal.  A 3 mm polyp was found in the transverse colon. The                            polyp was sessile. The polyp was removed with a                            cold snare. Resection and retrieval were complete.                            Estimated blood loss was minimal.                           A 3 mm polyp was found in the hepatic flexure. The                            polyp was sessile. The polyp was removed with a                            cold snare. Resection and retrieval were complete.                            Estimated blood loss was minimal.                           A 2 mm polyp was found in the ascending colon. The                            polyp was sessile. The polyp was removed with a                            cold snare. Resection and retrieval were complete.                            Estimated blood loss was minimal.                           The exam was  otherwise without abnormality on                            direct and retroflexion views. Complications:            No immediate complications. Estimated Blood Loss:     Estimated blood loss was minimal. Impression:               - One 2 mm polyp in the descending colon, removed                            with a cold snare. Resected and retrieved.                           - One 3 mm polyp in the transverse colon, removed  with a cold snare. Resected and retrieved.                           - One 3 mm polyp at the hepatic flexure, removed                            with a cold snare. Resected and retrieved.                           - One 2 mm polyp in the ascending colon, removed                            with a cold snare. Resected and retrieved.                           - The examination was otherwise normal on direct                            and retroflexion views. Recommendation:           - Patient has a contact number available for                            emergencies. The signs and symptoms of potential                            delayed complications were discussed with the                            patient. Return to normal activities tomorrow.                            Written discharge instructions were provided to the                            patient.                           - Resume previous diet.                           - Continue present medications.                           - Await pathology results.                           - Repeat colonoscopy date to be determined after                            pending pathology results are reviewed for                            surveillance.                           -  Emerging evidence supports eating a diet of                            fruits, vegetables, grains, calcium, and yogurt                            while reducing red meat and alcohol may reduce the                             risk of colon cancer.                           - Thank you for allowing me to be involved in your                            colon cancer prevention. Thornton Park MD, MD 10/14/2022 12:53:02 PM This report has been signed electronically.

## 2022-10-14 NOTE — Progress Notes (Signed)
Referring Provider: Rise Patience, DO Primary Care Physician:  Anita Patience, DO  Indication for Colonoscopy:  Colon cancer screening   IMPRESSION:  Need for colon cancer screening Appropriate candidate for monitored anesthesia care  PLAN: Colonoscopy in the [facility] today   HPI: Anita Hill is a 51 y.o. female presents for screening colonoscopy.  No prior colonoscopy or colon cancer screening.  No known family history of colon cancer or polyps. No family history of uterine/endometrial cancer, pancreatic cancer or gastric/stomach cancer.   Past Medical History:  Diagnosis Date   BIPOLAR DISORDER    Chest pain 01/20/2016   Delusions ([place])    Depression    Dyspnea 10/13/2012   Heart murmur    "MILD"   Homelessness    Left ventricular hypertrophy    Palpitations    Psychosis ([place])    Sickle cell trait ([facility])    Substance abuse ([place])     Past Surgical History:  Procedure Laterality Date   BUNIONECTOMY     SKIN BIOPSY      Current Outpatient Medications  Medication Sig Dispense Refill   benztropine (COGENTIN) 1 MG tablet Take 1 tablet (1 mg total) by mouth 2 (two) times daily. Dose increased please DC cogentin 0.5 mg three times daily. 60 tablet 3   divalproex (DEPAKOTE ER) 500 MG 24 hr tablet Take 2 tablets (1,000 mg total) by mouth daily at 10 pm. 60 tablet 3   doxepin (SINEQUAN) 100 MG capsule Take 1 capsule (100 mg total) by mouth at bedtime. 30 capsule 3   hydrOXYzine (VISTARIL) 25 MG capsule Take 1 capsule (25 mg total) by mouth 3 (three) times daily as needed. 60 capsule 3   nicotine (NICODERM CQ - DOSED IN MG/24 HOURS) 14 mg/24hr patch Place 1 patch (14 mg total) onto the skin every morning. 14 patch 3   haloperidol decanoate (HALDOL DECANOATE) 100 MG/ML injection Inject 1 mL (100 mg total) into the muscle every 30 (thirty) days. 1 mL 3   meloxicam (MOBIC) 15 MG tablet Take 0.5-1 tablets (7.5-15 mg total) by mouth daily as needed for pain. (Patient not  taking: Reported on 09/06/2022) 30 tablet 6   metoprolol succinate (TOPROL-XL) 25 MG 24 hr tablet TAKE 1 TABLET(25 MG) BY MOUTH DAILY (Patient not taking: Reported on 09/06/2022) 30 tablet 3   Semaglutide-Weight Management 0.25 MG/0.5ML SOAJ Inject 0.25 mg into the skin once a week. (Patient not taking: Reported on 09/06/2022) 2 mL 1   Current Facility-Administered Medications  Medication Dose Route Frequency Provider Last Rate Last Admin   0.9 %  sodium chloride infusion  500 mL Intravenous Once Thornton Park, MD        Allergies as of 10/14/2022 - Review Complete 10/14/2022  Allergen Reaction Noted   Strawberry extract Anaphylaxis 06/01/2012   Risperidone and related Hives, Nausea Only, and Other (See Comments) 06/01/2012   Anette Guarneri [lurasidone hcl] Other (See Comments) 11/22/2016    Family History  Problem Relation Age of Onset   Heart disease Mother        CHF   Prostate cancer Father    Colon polyps Neg Hx    Colon cancer Neg Hx    Crohn's disease Neg Hx    Esophageal cancer Neg Hx    Rectal cancer Neg Hx    Stomach cancer Neg Hx    Ulcerative colitis Neg Hx      Physical Exam: General:   Alert,  well-nourished, pleasant and cooperative in NAD Head:  Normocephalic and atraumatic. Eyes:  Sclera clear, no icterus.   Conjunctiva pink. Mouth:  No deformity or lesions.   Neck:  Supple; no masses or thyromegaly. Lungs:  Clear throughout to auscultation.   No wheezes. Heart:  Regular rate and rhythm; no murmurs. Abdomen:  Soft, non-tender, nondistended, normal bowel sounds, no rebound or guarding.  Msk:  Symmetrical. No boney deformities LAD: No inguinal or umbilical LAD Extremities:  No clubbing or edema. Neurologic:  Alert and  oriented x4;  grossly nonfocal Skin:  No obvious rash or bruise. Psych:  Alert and cooperative. Normal mood and affect.     Studies/Results: No results found.    [name] L. Tarri Glenn, MD, MPH 10/14/2022, 12:23 PM

## 2022-10-15 NOTE — Telephone Encounter (Signed)
Attempted to reach patient for post-procedure f/u call. No answer. Left message for her to please not hesitate to call if she has any questions/concerns regarding her care. 

## 2022-10-18 ENCOUNTER — Ambulatory Visit: Payer: Medicaid Other | Admitting: Family Medicine

## 2022-10-19 ENCOUNTER — Ambulatory Visit (HOSPITAL_COMMUNITY): Payer: Medicaid Other | Admitting: Clinical

## 2022-10-21 ENCOUNTER — Telehealth (HOSPITAL_COMMUNITY): Payer: Self-pay | Admitting: Clinical

## 2022-10-28 ENCOUNTER — Encounter: Payer: Self-pay | Admitting: Gastroenterology

## 2022-11-05 ENCOUNTER — Encounter (HOSPITAL_COMMUNITY): Payer: Self-pay

## 2022-11-05 ENCOUNTER — Ambulatory Visit (INDEPENDENT_AMBULATORY_CARE_PROVIDER_SITE_OTHER): Payer: Medicaid Other | Admitting: *Deleted

## 2022-11-05 VITALS — BP 142/94 | HR 112 | Ht 62.0 in | Wt 210.0 lb

## 2022-11-05 DIAGNOSIS — F209 Schizophrenia, unspecified: Secondary | ICD-10-CM

## 2022-11-05 MED ORDER — HALOPERIDOL DECANOATE 100 MG/ML IM SOLN
100.0000 mg | INTRAMUSCULAR | Status: AC
  Administered 2022-11-05 – 2023-08-18 (×7): 100 mg via INTRAMUSCULAR

## 2022-11-05 NOTE — Patient Instructions (Signed)
Patient arrived with her daughter today for injection after missing the month of December injection. haloperidol decanoate (HALDOL DECANOATE) 100 MG/ML injection  Daughter has had her phone  # as primary contact to help keep with & when Mom's doctors appointments. Patient pleasant as always & fashionable dressed as usual. Patient stated she had on to many long sleeves shirts & would take he injection today in her Gluteal. Patient  & daughter shared how a wonderful time was had on patients birthday.  Patient tolerated injection well in Right Upper Outer Quadrant.  NO  HI/SI  NOR AH/VH

## 2022-11-05 NOTE — Progress Notes (Cosign Needed)
Patient arrived with her daughter today for injection after missing the month of December injection. haloperidol decanoate (HALDOL DECANOATE) 100 MG/ML injection  Daughter has had her phone  # as primary contact to help keep with & when Mom's doctors appointments. Patient pleasant as always & fashionable dressed as usual. Patient stated she had on to many long sleeves shirts & would take he injection today in her Gluteal. Patient  & daughter shared how a wonderful time was had on patients birthday.  Patient tolerated injection well in Right Upper Outer Quadrant.  NO  HI/SI  NOR AH/VH

## 2022-11-08 ENCOUNTER — Ambulatory Visit: Payer: Medicaid Other | Admitting: Podiatry

## 2022-11-09 ENCOUNTER — Ambulatory Visit: Payer: Medicaid Other | Admitting: Physician Assistant

## 2022-11-11 ENCOUNTER — Other Ambulatory Visit (HOSPITAL_COMMUNITY): Payer: Self-pay | Admitting: Psychiatry

## 2022-11-11 DIAGNOSIS — F17219 Nicotine dependence, cigarettes, with unspecified nicotine-induced disorders: Secondary | ICD-10-CM

## 2022-11-11 MED ORDER — NICOTINE 14 MG/24HR TD PT24: 14.0000 mg | MEDICATED_PATCH | Freq: Every morning | TRANSDERMAL | 3 refills | Status: DC

## 2022-11-11 NOTE — Telephone Encounter (Signed)
[facility] faxed a rx request to [place] for patients nicotine patches. We have been writing rx for 2 weeks with refills rather than a month with refills. I spoke with Pharmacy and she should be out. She last filled on 10/26/22. Will forward request to the provider for new rxs to be sent in.

## 2022-11-11 NOTE — Telephone Encounter (Signed)
Order rewritten for 30-day supply and sent to preferred pharmacy.

## 2022-11-15 ENCOUNTER — Ambulatory Visit (HOSPITAL_COMMUNITY): Payer: Medicaid Other | Admitting: Clinical

## 2022-11-22 ENCOUNTER — Ambulatory Visit (INDEPENDENT_AMBULATORY_CARE_PROVIDER_SITE_OTHER): Payer: Medicaid Other | Admitting: Podiatry

## 2022-11-22 DIAGNOSIS — Z91199 Patient's noncompliance with other medical treatment and regimen due to unspecified reason: Secondary | ICD-10-CM

## 2022-11-22 NOTE — Progress Notes (Signed)
1. No-show for appointment

## 2022-12-02 ENCOUNTER — Ambulatory Visit (INDEPENDENT_AMBULATORY_CARE_PROVIDER_SITE_OTHER): Payer: Medicaid Other | Admitting: Psychiatry

## 2022-12-02 ENCOUNTER — Encounter (HOSPITAL_COMMUNITY): Payer: Self-pay

## 2022-12-02 ENCOUNTER — Ambulatory Visit (HOSPITAL_COMMUNITY): Payer: Medicaid Other

## 2022-12-02 DIAGNOSIS — F411 Generalized anxiety disorder: Secondary | ICD-10-CM | POA: Diagnosis not present

## 2022-12-02 DIAGNOSIS — F5105 Insomnia due to other mental disorder: Secondary | ICD-10-CM | POA: Diagnosis not present

## 2022-12-02 DIAGNOSIS — F209 Schizophrenia, unspecified: Secondary | ICD-10-CM

## 2022-12-02 MED ORDER — DOXEPIN HCL 100 MG PO CAPS: 100.0000 mg | ORAL_CAPSULE | Freq: Every day | ORAL | 3 refills | Status: DC

## 2022-12-02 MED ORDER — DIVALPROEX SODIUM ER 500 MG PO TB24: 1000.0000 mg | ORAL_TABLET | Freq: Every day | ORAL | 3 refills | Status: DC

## 2022-12-02 MED ORDER — HYDROXYZINE PAMOATE 25 MG PO CAPS: 25.0000 mg | ORAL_CAPSULE | Freq: Three times a day (TID) | ORAL | 3 refills | Status: DC | PRN

## 2022-12-02 MED ORDER — BENZTROPINE MESYLATE 1 MG PO TABS: 1.0000 mg | ORAL_TABLET | Freq: Two times a day (BID) | ORAL | 3 refills | Status: DC

## 2022-12-02 MED ORDER — HALOPERIDOL DECANOATE 100 MG/ML IM SOLN: 100.0000 mg | INTRAMUSCULAR | 3 refills | Status: DC

## 2022-12-02 NOTE — Progress Notes (Signed)
[place] MD/PA/NP OP Progress Note Virtual Visit via Telephone Note  I connected with Anita Hill on 12/02/22 at 10:30 AM EST by telephone and verified that I am speaking with the correct person using two identifiers.  Location: Patient: home Provider: Clinic   I discussed the limitations, risks, security and privacy concerns of performing an evaluation and management service by telephone and the availability of in person appointments. I also discussed with the patient that there may be a patient responsible charge related to this service. The patient expressed understanding and agreed to proceed.   I provided 30 minutes of non-face-to-face time during this encounter.  12/02/2022 1:09 PM Anita Hill  MRN:  [id number]  Chief Complaint: "Mentally I'm well but physically I am not"  HPI: 52 year old female seen today for follow-up psychiatric evaluation.  She has a psychiatric history of schizophrenia, insomnia, anxiety, nicotine dependence, bipolar 1 disorder, and alcohol induced mood disorder.  Currently she is managed on Depakote 1000 mg nightly, doxepin 100 mg nightly, hydroxyzine 25 mg 3 times daily as needed, Haldol 100 mg injection every 28 days, Cogentin 0.5 mg 3 times daily, hydroxyzine 25 mg 3 times daily, and NicoDerm CQ 14 mg patches.  She reports her medications are effective in managing her psychiatric condition.  Today she unable to login virtually so her assessment was done over the phone.  Patient informed Probation officer that she was unable to get a ride to the clinic.  She notes that she missed her last Haldol injection reports that she would have to reschedule her next injection.  Patient informed Probation officer that mentally she is doing well but notes that physically she is not.  She reports that she has been having shortness of breath and leg pain.  Patient notes that she plans to follow-up with primary care soon to address these concerns.   Today patient notes that her mood is stable and  reports that she has minimal anxiety and depression.  Provider conducted a GAD-7 and patient scored a 9, at her last visit she scored a 3.  She reports that she worries about her daughter and her living situation.  Patient notes that she will be living in a group home.  Provider also conducted PHQ-9 patient scored a 12, at her last visit she scored 4.  She endorses adequate appetite and fluctuations in sleep.  Currently patient notes that she gets 4 to 5 hours nightly of sleep. Today she denies SI/HI/VAH, mania, paranoia.  Today no medication changes made.  Patient agreeable to continue medications as prescribed.  Provider encouraged patient to come into the clinic for her next visit or have needs to login virtually.  She endorsed understanding and agreed.  No other concerns noted at this time.  Visit Diagnosis:    ICD-10-CM   1. Schizophrenia, unspecified type ([place])  F20.9 benztropine (COGENTIN) 1 MG tablet    divalproex (DEPAKOTE ER) 500 MG 24 hr tablet    doxepin (SINEQUAN) 100 MG capsule    haloperidol decanoate (HALDOL DECANOATE) 100 MG/ML injection    2. Insomnia due to mental condition  F51.05 doxepin (SINEQUAN) 100 MG capsule    3. Anxiety state  F41.1 hydrOXYzine (VISTARIL) 25 MG capsule       Past Psychiatric History: schizophrenia, insomnia, anxiety, nicotine dependence, bipolar 1 disorder, and alcohol induced mood disorder.  Past Medical History:  Past Medical History:  Diagnosis Date   BIPOLAR DISORDER    Chest pain 01/20/2016   Delusions ([place])  Depression    Dyspnea 10/13/2012   Heart murmur    "MILD"   Homelessness    Left ventricular hypertrophy    Palpitations    Psychosis (HCC)    Sickle cell trait (HCC)    Substance abuse ([place])     Past Surgical History:  Procedure Laterality Date   BUNIONECTOMY     SKIN BIOPSY      Family Psychiatric History: none reported   Family History:  Family History  Problem Relation Age of Onset   Heart disease Mother         CHF   Prostate cancer Father    Colon polyps Neg Hx    Colon cancer Neg Hx    Crohn's disease Neg Hx    Esophageal cancer Neg Hx    Rectal cancer Neg Hx    Stomach cancer Neg Hx    Ulcerative colitis Neg Hx     Social History:  Social History   Socioeconomic History   Marital status: Single    Spouse name: Not on file   Number of children: 2   Years of education: Not on file   Highest education level: Not on file  Occupational History    Comment: Unemployed  Tobacco Use   Smoking status: Every Day    Packs/day: 1.00    Years: 10.00    Total pack years: 10.00    Types: Cigarettes, Cigars    Passive exposure: Never   Smokeless tobacco: Never  Vaping Use   Vaping Use: Never used  Substance and Sexual Activity   Alcohol use: Yes    Comment: everyday 20-80oz   Drug use: Yes    Types: Cocaine    Comment: Previous cocaine, heroin, marijuana,CLEAN 21 YEARS UPDATED 09/06/22   Sexual activity: Not Currently  Other Topics Concern   Not on file  Social History Narrative   Not on file   Social Determinants of Health   Financial Resource Strain: Low Risk  (07/15/2021)   Overall Financial Resource Strain (CARDIA)    Difficulty of Paying Living Expenses: Not hard at all  Food Insecurity: No Food Insecurity (07/15/2021)   Hunger Vital Sign    Worried About Running Out of Food in the Last Year: Never true    Ran Out of Food in the Last Year: Never true  Transportation Needs: No Transportation Needs (07/15/2021)   PRAPARE - Hydrologist (Medical): No    Lack of Transportation (Non-Medical): No  Physical Activity: Not on file  Stress: Not on file  Social Connections: Not on file    Allergies:  Allergies  Allergen Reactions   Strawberry Extract Anaphylaxis   Risperidone And Related Hives, Nausea Only and Other (See Comments)    Made me feel jittery and restless/ Pt denies this allergy on 01/27/21   Latuda [Lurasidone Hcl] Other (See Comments)     Hot and cold flashes    Metabolic Disorder Labs: Lab Results  Component Value Date   HGBA1C 5.9 (H) 03/22/2022   MPG 125.5 05/08/2021   Lab Results  Component Value Date   PROLACTIN 2.6 (L) 05/08/2021   Lab Results  Component Value Date   CHOL 213 (H) 03/22/2022   TRIG 250 (H) 03/22/2022   HDL 67 03/22/2022   CHOLHDL 3.2 03/22/2022   VLDL 13 05/08/2021   LDLCALC 104 (H) 03/22/2022   LDLCALC 78 05/08/2021   Lab Results  Component Value Date   TSH 3.290 03/22/2022  TSH 3.006 05/08/2021    Therapeutic Level Labs: No results found for: "LITHIUM" Lab Results  Component Value Date   VALPROATE 78 05/31/2022   VALPROATE <4 (L) 03/22/2022   Lab Results  Component Value Date   CBMZ <2.0 (L) 01/08/2008    Current Medications: Current Outpatient Medications  Medication Sig Dispense Refill   benztropine (COGENTIN) 1 MG tablet Take 1 tablet (1 mg total) by mouth 2 (two) times daily. Dose increased please DC cogentin 0.5 mg three times daily. 60 tablet 3   divalproex (DEPAKOTE ER) 500 MG 24 hr tablet Take 2 tablets (1,000 mg total) by mouth daily at 10 pm. 60 tablet 3   doxepin (SINEQUAN) 100 MG capsule Take 1 capsule (100 mg total) by mouth at bedtime. 30 capsule 3   haloperidol decanoate (HALDOL DECANOATE) 100 MG/ML injection Inject 1 mL (100 mg total) into the muscle every 30 (thirty) days. 1 mL 3   hydrOXYzine (VISTARIL) 25 MG capsule Take 1 capsule (25 mg total) by mouth 3 (three) times daily as needed. 60 capsule 3   meloxicam (MOBIC) 15 MG tablet Take 0.5-1 tablets (7.5-15 mg total) by mouth daily as needed for pain. 30 tablet 6   metoprolol succinate (TOPROL-XL) 25 MG 24 hr tablet TAKE 1 TABLET(25 MG) BY MOUTH DAILY 30 tablet 3   nicotine (NICODERM CQ - DOSED IN MG/24 HOURS) 14 mg/24hr patch Place 1 patch (14 mg total) onto the skin every morning. 30 patch 3   Semaglutide-Weight Management 0.25 MG/0.5ML SOAJ Inject 0.25 mg into the skin once a week. 2 mL 1    Current Facility-Administered Medications  Medication Dose Route Frequency Provider Last Rate Last Admin   haloperidol decanoate (HALDOL DECANOATE) 100 MG/ML injection 100 mg  100 mg Intramuscular Q30 days Eulis Canner E, NP   100 mg at 11/05/22 1321     Musculoskeletal: Strength & Muscle Tone:  Unable to assess due to telephone visit Gait & Station:  Unable to assess due to telephone visit Patient leans: N/A  Psychiatric Specialty Exam: Review of Systems  There were no vitals taken for this visit.There is no height or weight on file to calculate BMI.  General Appearance:  Unable to assess due to telephone visit  Eye Contact:   Unable to assess due to telephone visit  Speech:  Clear and Coherent and Normal Rate  Volume:  Normal  Mood:  Euthymic  Affect:  Appropriate and Congruent  Thought Process:  Coherent, Goal Directed, and Linear  Orientation:  Full (Time, Place, and Person)  Thought Content: WDL and Logical   Suicidal Thoughts:  No  Homicidal Thoughts:  No  Memory:  Immediate;   Good Recent;   Good Remote;   Good  Judgement:  Good  Insight:  Good  Psychomotor Activity:   Unable to assess due to telephone visit  Concentration:  Concentration: Good and Attention Span: Good  Recall:  Good  Fund of Knowledge: Good  Language: Good  Akathisia:  No  Handed:  Right  AIMS (if indicated): Not done  Assets:  Communication Skills Desire for Improvement Financial Resources/Insurance Housing Leisure Time Physical Health Social Support  ADL's:  Intact  Cognition: WNL  Sleep:  Fair   Screenings: [place] Office Visit from 09/02/2022 in [facility] Office Visit from 04/08/2022 in [facility] Office Visit from 02/09/2022 in [facility] Admission (Discharged) from 05/09/2021 in [place]  ADULT 500B  AIMS Total Score 3 0 0 0      AUDIT     Flowsheet Row Admission (Discharged) from 05/09/2021 in [place] [facility]  Alcohol Use Disorder Identification Test Final Score (AUDIT) 5      GAD-7    Flowsheet Row Office Visit from 12/02/2022 in [facility] Office Visit from 09/02/2022 in [facility] Office Visit from 04/08/2022 in [facility]  Total GAD-7 Score 9 3 0      PHQ2-9    [place] Office Visit from 12/02/2022 in [facility] Office Visit from 09/02/2022 in [facility] Office Visit from 06/28/2022 in [place] Office Visit from 06/07/2022 in [facility] Office Visit from 04/08/2022 in [facility]  PHQ-2 Total Score 2 0 2 0 0  PHQ-9 Total Score '12 4 7 4 '$ --      [place] Office Visit from 04/08/2022 in [facility] Counselor from 11/17/2021 in [facility] ED from 07/01/2021 in [facility] Emergency Department at [place] No Risk No Risk No Risk        Assessment and Plan: Patient reports that she is doing well on her current medication regimen.  She does note that she is somewhat anxious about her living situation. Today no medication changes made.  Patient agreeable to continue medications as prescribed.  Provider encouraged patient to come into the clinic for her next visit or have needs to login virtually.  She endorsed understanding and agreed.  1. Schizophrenia, unspecified type ([place])  Continue- divalproex (DEPAKOTE ER) 500 MG 24 hr tablet; Take 2 tablets (1,000 mg total) by mouth daily at 10 pm.  Dispense: 60 tablet; Refill: 3 Continue- doxepin (SINEQUAN) 100 MG capsule; Take 1 capsule (100 mg total) by mouth at bedtime.  Dispense: 30 capsule; Refill: 3 Continue- haloperidol decanoate  (HALDOL DECANOATE) 100 MG/ML injection; Inject 1 mL (100 mg total) into the muscle every 30 (thirty) days.  Dispense: 1 mL; Refill: 3 Increase- benztropine (COGENTIN) 1 MG tablet; Take 2 tablets (2 mg total) by mouth 2 (two) times daily. Dose increased please DC cogentin 0.5 mg three times daily.  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  Dispense: 30 capsule; Refill: 3  3. Anxiety state  Continue- hydrOXYzine (VISTARIL) 25 MG capsule; Take 1 capsule (25 mg total) by mouth 3 (three) times daily as needed.  Dispense: 60 capsule; Refill: 3  4. Cigarette nicotine dependence with nicotine-induced disorder  Restart- nicotine (NICODERM CQ - DOSED IN MG/24 HOURS) 14 mg/24hr patch; Place 1 patch (14 mg total) onto the skin every morning.  Dispense: 14 patch; Refill: 3   Collaboration of Care: Collaboration of Care: Other provider involved in patient's care AEB PCP and shot clinic staff  Patient/Guardian was advised Release of Information must be obtained prior to any record release in order to collaborate their care with an outside provider. Patient/Guardian was advised if they have not already done so to contact the registration department to sign all necessary forms in order for [place] to release information regarding their care.   Consent: Patient/Guardian gives verbal consent for treatment and assignment of benefits for services provided during  this visit. Patient/Guardian expressed understanding and agreed to proceed.   Follow-up in 3 months   Salley Slaughter, NP 12/02/2022, 1:09 PM

## 2022-12-03 ENCOUNTER — Ambulatory Visit (HOSPITAL_COMMUNITY): Payer: Medicaid Other

## 2022-12-13 DIAGNOSIS — Z0389 Encounter for observation for other suspected diseases and conditions ruled out: Secondary | ICD-10-CM | POA: Diagnosis not present

## 2022-12-23 ENCOUNTER — Telehealth (HOSPITAL_COMMUNITY): Payer: Self-pay | Admitting: *Deleted

## 2022-12-23 NOTE — Telephone Encounter (Signed)
Called to schedule an new shot appt, no showed on 12/06/22. She is scheduled now for Thurs the 29th of Feb.

## 2022-12-30 ENCOUNTER — Ambulatory Visit (INDEPENDENT_AMBULATORY_CARE_PROVIDER_SITE_OTHER): Payer: Medicaid Other | Admitting: Family Medicine

## 2022-12-30 ENCOUNTER — Encounter: Payer: Self-pay | Admitting: Family Medicine

## 2022-12-30 ENCOUNTER — Encounter (HOSPITAL_COMMUNITY): Payer: Self-pay

## 2022-12-30 ENCOUNTER — Ambulatory Visit (INDEPENDENT_AMBULATORY_CARE_PROVIDER_SITE_OTHER): Payer: Medicaid Other

## 2022-12-30 VITALS — BP 141/90 | HR 117 | Ht 62.0 in | Wt 215.0 lb

## 2022-12-30 DIAGNOSIS — H538 Other visual disturbances: Secondary | ICD-10-CM | POA: Diagnosis not present

## 2022-12-30 DIAGNOSIS — F29 Unspecified psychosis not due to a substance or known physiological condition: Secondary | ICD-10-CM

## 2022-12-30 DIAGNOSIS — F5105 Insomnia due to other mental disorder: Secondary | ICD-10-CM | POA: Diagnosis not present

## 2022-12-30 DIAGNOSIS — I10 Essential (primary) hypertension: Secondary | ICD-10-CM

## 2022-12-30 DIAGNOSIS — F209 Schizophrenia, unspecified: Secondary | ICD-10-CM

## 2022-12-30 DIAGNOSIS — F330 Major depressive disorder, recurrent, mild: Secondary | ICD-10-CM

## 2022-12-30 MED ORDER — AMLODIPINE BESYLATE 5 MG PO TABS: 5.0000 mg | ORAL_TABLET | Freq: Every day | ORAL | 0 refills | Status: DC

## 2022-12-30 NOTE — Progress Notes (Signed)
PATIENT ARRIVED FOR haloperidol decanoate (HALDOL DECANOATE) 100 MG/ML injection 100 mg  WHICH IS OVERDUE  100 mg, Intramuscular, Every 30 days   TOLERATED INJECTION WELL IN LEFT UPPER QUAD  & PLEASANT AS ALWAYS

## 2022-12-30 NOTE — Patient Instructions (Signed)
Nice to meet you today  Start amlodipine '5mg'$  daily Follow up with Dr. Valentina Lucks next week as scheduled  Be well, Dr. Ardelia Mems

## 2022-12-30 NOTE — Progress Notes (Signed)
  Date of Visit: 12/30/2022   SUBJECTIVE:   HPI:  [place] presents today for a same day appointment to discuss elevated blood pressures.  Hypertension: Not currently on any antihypertensives.  She was seen earlier today for her monthly Haldol injection, and our clinic noted that her blood pressure was very high.  Reportedly they got something around 170s over 150s.  Her daughter accompanies her to today's visit and helps provide the history.  They brought a log of her home blood pressures, and most are elevated above 140/90.  She admits to some slight blurry vision, and would like to see an eye doctor.  Blurry vision is chronic, not acute.   OBJECTIVE:   BP (!) 141/90   Pulse (!) 117   Ht '5\' 2"'$  (1.575 m)   Wt 215 lb (97.5 kg)   SpO2 99%   BMI 39.32 kg/m  Gen: No acute distress, pleasant, cooperative HEENT: Normocephalic, atraumatic Heart: Regular rate and rhythm, no murmur Lungs: Clear to auscultation bilaterally, normal effort Neuro: Grossly nonfocal, speech normal Ext: No edema of bilateral lower extremities  ASSESSMENT/PLAN:   Health maintenance:  -Offered flu shot today, patient declined, reports in the past she had her arm swell up after getting a vaccine  Hypertension Uncontrolled today and on home readings.  Add amlodipine 5 mg daily.  Visit scheduled next week to follow-up on blood pressure with Dr. Valentina Lucks.  Blurry vision Ongoing, likely age-related.  Refer to ophthalmology for evaluation, may need new glasses.  [place]. Ardelia Mems, [place]

## 2023-01-01 NOTE — Assessment & Plan Note (Signed)
Uncontrolled today and on home readings.  Add amlodipine 5 mg daily.  Visit scheduled next week to follow-up on blood pressure with Dr. Valentina Lucks.

## 2023-01-05 ENCOUNTER — Ambulatory Visit (INDEPENDENT_AMBULATORY_CARE_PROVIDER_SITE_OTHER): Payer: Medicaid Other | Admitting: Pharmacist

## 2023-01-05 ENCOUNTER — Encounter: Payer: Self-pay | Admitting: Pharmacist

## 2023-01-05 VITALS — BP 118/84 | HR 99 | Wt 210.6 lb

## 2023-01-05 DIAGNOSIS — I10 Essential (primary) hypertension: Secondary | ICD-10-CM

## 2023-01-05 DIAGNOSIS — Z720 Tobacco use: Secondary | ICD-10-CM

## 2023-01-05 NOTE — Progress Notes (Signed)
   S:     Chief Complaint  Patient presents with   Medication Management    Hypertension - BP follow-up   52 y.o. female who presents for hypertension evaluation, education, management, and smoking cessation.  PMH is significant for HTN. Patient was referred and last seen by Primary Care Provider, Dr. Ardelia Mems, on 12/30/2022.   Today, patient arrives with her daughter (who is her legal guardian) in good spirits and presents without assistance. Denies dizziness, headache, blurred vision, and swelling in lower legs. Reports swelling in knees, agitation during periods of nicotine withdrawals, itchiness at placement site of nicotine patch, and waking up to smoke 3-4 times during the night despite wearing nicotine patch to bed.  Family/Social history: Started smoking 1 PPD at age 43. Currently smokes 3 HD cigars per day.  Medication adherence: Patient is adherent to medications.  Current Medications include: Amlodipine '5mg'$  daily  O:  Review of Systems  Musculoskeletal:  Positive for joint pain (In right knee).  All other systems reviewed and are negative.   Physical Exam Constitutional:      Appearance: Normal appearance.  Musculoskeletal:     Right lower leg: Edema (swelling in right knee) present.  Neurological:     Mental Status: She is alert.  Psychiatric:        Mood and Affect: Mood normal.        Thought Content: Thought content normal.    Last 3 Office BP readings: BP Readings from Last 3 Encounters:  01/05/23 118/84  12/30/22 (!) 141/90  11/05/22 (!) 142/94   Patient is participating in a Managed Medicaid Plan:  Yes   A/P: Hypertension diagnosed in 2023, fairly well controlled on current regimen. BP goal < 130/80 mmHg. Pt reports being adherent to medications. Control is suboptimal due to elevated diastolic level in most recent BP reading (118/84 mmHg).  -Continued amlodipine '5mg'$  daily at bedtime.   History of tobacco use since the age of 87, currently smoking 3  cigars per day. Reports using nicotine 14 mg patches. Tobacco cessation is currently uncontrolled due to issues with agitation during withdrawals. -Encouraged patient to reduce smoking from 3 to 2 cigars per day -Encouraged patient to remove nicotine patch 30 mins before bedtime to help with insomnia  Results reviewed and written information provided.    Written patient instructions provided. Patient verbalized understanding of treatment plan.  Total time in face to face counseling 35 minutes.    Follow-up:  PCP clinic visit on 03/08/2023.  Patient seen with Louanne Belton PharmD PGY-1 Pharmacy Resident and Estelle June, PharmD Candidate.

## 2023-01-05 NOTE — Patient Instructions (Addendum)
It was nice to see you today!  Your goal blood pressure is <130/80 - today it was 118/84 mmHg. Keep up the great work!  Medication Changes: Continue amlodipine 5 mg daily  Continue to try an wean the number of cigars you smoke per day down from 3 to 2! Try to remove your nicotine patches around 30 minutes before you go to bed to help with your insomnia.  Monitor blood pressure at home daily and keep a log (on your phone or piece of paper) to bring with you to your next visit. Write down date, time, blood pressure and pulse.  Keep up the good work with diet and exercise. Aim for a diet full of vegetables, fruit and lean meats (chicken, [place], fish). Try to limit salt intake by eating fresh or frozen vegetables (instead of canned), rinse canned vegetables prior to cooking and do not add any additional salt to meals.

## 2023-01-05 NOTE — Assessment & Plan Note (Signed)
History of tobacco use since the age of 39, currently smoking 3 cigars per day. Reports using nicotine 14 mg patches. Tobacco cessation is currently uncontrolled due to issues with agitation during withdrawals. -Encouraged patient to reduce smoking from 3 to 2 cigars per day -Encouraged patient to remove nicotine patch 30 mins before bedtime to help with insomnia

## 2023-01-05 NOTE — Assessment & Plan Note (Signed)
Hypertension diagnosed in 2023, fairly well controlled on current regimen. BP goal < 130/80 mmHg. Pt reports being adherent to medications. Control is suboptimal due to elevated diastolic level in most recent BP reading (118/84 mmHg).  -Continued amlodipine '5mg'$  daily at bedtime.

## 2023-01-06 NOTE — Progress Notes (Signed)
Reviewed and agree with Dr Graylin Shiver plan.

## 2023-01-19 ENCOUNTER — Ambulatory Visit: Payer: Medicaid Other | Admitting: Orthopaedic Surgery

## 2023-01-21 ENCOUNTER — Ambulatory Visit: Payer: Medicaid Other | Admitting: Orthopaedic Surgery

## 2023-01-25 DIAGNOSIS — Z0389 Encounter for observation for other suspected diseases and conditions ruled out: Secondary | ICD-10-CM | POA: Diagnosis not present

## 2023-02-01 ENCOUNTER — Ambulatory Visit (INDEPENDENT_AMBULATORY_CARE_PROVIDER_SITE_OTHER): Payer: Medicaid Other | Admitting: Orthopaedic Surgery

## 2023-02-01 ENCOUNTER — Encounter: Payer: Self-pay | Admitting: Orthopaedic Surgery

## 2023-02-01 ENCOUNTER — Other Ambulatory Visit (INDEPENDENT_AMBULATORY_CARE_PROVIDER_SITE_OTHER): Payer: Medicaid Other

## 2023-02-01 VITALS — BP 128/94 | HR 97 | Ht 62.0 in | Wt 203.0 lb

## 2023-02-01 DIAGNOSIS — M65961 Unspecified synovitis and tenosynovitis, right lower leg: Secondary | ICD-10-CM | POA: Insufficient documentation

## 2023-02-01 DIAGNOSIS — M25561 Pain in right knee: Secondary | ICD-10-CM

## 2023-02-01 DIAGNOSIS — Z7689 Persons encountering health services in other specified circumstances: Secondary | ICD-10-CM | POA: Diagnosis not present

## 2023-02-01 DIAGNOSIS — M659 Synovitis and tenosynovitis, unspecified: Secondary | ICD-10-CM

## 2023-02-01 HISTORY — DX: Unspecified synovitis and tenosynovitis, right lower leg: M65.961

## 2023-02-01 MED ORDER — BUPIVACAINE HCL 0.25 % IJ SOLN
4.0000 mL | INTRAMUSCULAR | Status: AC | PRN
  Administered 2023-02-01: 4 mL via INTRA_ARTICULAR

## 2023-02-01 MED ORDER — LIDOCAINE HCL 1 % IJ SOLN
0.5000 mL | INTRAMUSCULAR | Status: AC | PRN
  Administered 2023-02-01: .5 mL

## 2023-02-01 MED ORDER — METHYLPREDNISOLONE ACETATE 40 MG/ML IJ SUSP
40.0000 mg | INTRAMUSCULAR | Status: AC | PRN
Start: 2023-02-01 — End: 2023-02-01
  Administered 2023-02-01: 40 mg via INTRA_ARTICULAR

## 2023-02-01 NOTE — Progress Notes (Signed)
Office Visit Note   Patient: Anita Hill           Date of Birth: 08-09-1971           MRN: [id number] Visit Date: 02/01/2023              Requested by: Rise Patience, DO [address] [place],  [place] [address] PCP: Rise Patience, DO   Assessment & Plan: Visit Diagnoses:  1. Acute pain of right knee   2. Synovitis of right knee     Plan: Right knee injection performed we discussed the plain radiographic findings of some chondrocalcinosis.  She can use anti-inflammatories if needed and office follow-up as needed.  Follow-Up Instructions: No follow-ups on file.   Orders:  Orders Placed This Encounter  Procedures   Large Joint Inj   XR KNEE 3 VIEW RIGHT   No orders of the defined types were placed in this encounter.     Procedures: Large Joint Inj: R knee on 02/01/2023 3:29 PM Indications: pain and joint swelling Details: 22 G 1.5 in needle, anterolateral approach  Arthrogram: No  Medications: 40 mg methylPREDNISolone acetate 40 MG/ML; 0.5 mL lidocaine 1 %; 4 mL bupivacaine 0.25 % Outcome: tolerated well, no immediate complications Procedure, treatment alternatives, risks and benefits explained, specific risks discussed. Consent was given by the patient. Immediately prior to procedure a time out was called to verify the correct patient, procedure, equipment, support staff and site/side marked as required. Patient was prepped and draped in the usual sterile fashion.       Clinical Data: No additional findings.   Subjective: Chief Complaint  Patient presents with   Right Knee - Pain    HPI patient seen with right knee pain.  She previously had seen Dr. Erlinda Hong in 2021 after tibial plateau proximal fibular fracture but she states she is sure that it was her right knee  She states her left knee is doing great with no problems.  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  We  reviewed Dr.Xu previous office notes as well as ER notes x-rays and CT scan and indeed it was her left tibial plateau that had the fracture which is no longer symptomatic.  She does note that she has some crepitus in her knees with flexion extension.  She has been ambulating with a right knee limp.  Patient states she knee was bothering her so much she had to stop working.  Review of Systems all the systems noncontributory to HPI.  Of note is history of alcohol use disorder, schizophrenia, tobacco abuse overweight, bipolar disorder and hypertension.   Objective: Vital Signs: BP (!) 128/94   Pulse 97   Ht 5\' 2"  (1.575 m)   Wt 203 lb (92.1 kg)   BMI 37.13 kg/m   Physical Exam Constitutional:      Appearance: She is well-developed.  HENT:     Head: Normocephalic.     Right Ear: External ear normal.     Left Ear: External ear normal. There is no impacted cerumen.  Eyes:     Pupils: Pupils are equal, round, and reactive to light.  Neck:     Thyroid: No thyromegaly.     Trachea: No tracheal deviation.  Cardiovascular:     Rate and Rhythm: Normal rate.  Pulmonary:     Effort: Pulmonary effort is normal.  Abdominal:  Palpations: Abdomen is soft.  Musculoskeletal:     Cervical back: No rigidity.  Skin:    General: Skin is warm and dry.  Neurological:     Mental Status: She is alert and oriented to person, place, and time.  Psychiatric:        Behavior: Behavior normal.     Ortho Exam patient with crepitus both knees with flexion extension and patellofemoral loading without abnormal patellar tracking.  Lachman test is negative.  2+ synovitis.  Specialty Comments:  No specialty comments available.  Imaging: XR KNEE 3 VIEW RIGHT  Result Date: 02/01/2023 Three-view x-rays right knee AP lateral and sunrise patella demonstrates slight chondrocalcinosis of the meniscus medial and lateral.  Negative for acute fracture or significant degenerative changes. Impression: Right knee mild  chondrocalcinosis of the meniscus.    PMFS History: Patient Active Problem List   Diagnosis Date Noted   Synovitis of right knee 02/01/2023   Healthcare maintenance 11/11/2021   Weight gain 11/11/2021   Hypertension 11/11/2021   Delusions 05/10/2021   Alcohol use disorder, moderate, dependence 04/14/2021   Alcohol dependence with alcohol-induced mood disorder 01/12/2021   Tobacco abuse 07/06/2012   Schizophrenia spectrum disorder with psychotic disorder type not yet determined    Congestive heart failure    Bipolar I disorder ([place]) 12/29/2006   BACK PAIN, LOW 12/29/2006   Past Medical History:  Diagnosis Date   BIPOLAR DISORDER    Chest pain 01/20/2016   Delusions    Depression    Dyspnea 10/13/2012   Heart murmur    "MILD"   Homelessness    Left ventricular hypertrophy    Palpitations    Psychosis    Sickle cell trait    Substance abuse     Family History  Problem Relation Age of Onset   Heart disease Mother        CHF   Prostate cancer Father    Colon polyps Neg Hx    Colon cancer Neg Hx    Crohn's disease Neg Hx    Esophageal cancer Neg Hx    Rectal cancer Neg Hx    Stomach cancer Neg Hx    Ulcerative colitis Neg Hx     Past Surgical History:  Procedure Laterality Date   BUNIONECTOMY     SKIN BIOPSY     Social History   Occupational History    Comment: Unemployed  Tobacco Use   Smoking status: Every Day    Packs/day: 1.00    Years: 35.00    Additional pack years: 0.00    Total pack years: 35.00    Types: Cigarettes, Cigars    Start date: 11/02/1987    Passive exposure: Never   Smokeless tobacco: Never  Vaping Use   Vaping Use: Never used  Substance and Sexual Activity   Alcohol use: Yes    Comment: everyday 20-80oz   Drug use: Yes    Types: Cocaine    Comment: Previous cocaine, heroin, marijuana,CLEAN 21 YEARS UPDATED 09/06/22   Sexual activity: Not Currently

## 2023-02-08 ENCOUNTER — Ambulatory Visit (INDEPENDENT_AMBULATORY_CARE_PROVIDER_SITE_OTHER): Payer: Medicaid Other | Admitting: Psychiatry

## 2023-02-08 ENCOUNTER — Ambulatory Visit (INDEPENDENT_AMBULATORY_CARE_PROVIDER_SITE_OTHER): Payer: Medicaid Other | Admitting: *Deleted

## 2023-02-08 ENCOUNTER — Encounter (HOSPITAL_COMMUNITY): Payer: Self-pay

## 2023-02-08 DIAGNOSIS — F17219 Nicotine dependence, cigarettes, with unspecified nicotine-induced disorders: Secondary | ICD-10-CM | POA: Diagnosis not present

## 2023-02-08 DIAGNOSIS — F209 Schizophrenia, unspecified: Secondary | ICD-10-CM | POA: Diagnosis not present

## 2023-02-08 DIAGNOSIS — F5105 Insomnia due to other mental disorder: Secondary | ICD-10-CM

## 2023-02-08 DIAGNOSIS — F411 Generalized anxiety disorder: Secondary | ICD-10-CM | POA: Diagnosis not present

## 2023-02-08 DIAGNOSIS — Z0389 Encounter for observation for other suspected diseases and conditions ruled out: Secondary | ICD-10-CM | POA: Diagnosis not present

## 2023-02-08 MED ORDER — DIVALPROEX SODIUM ER 500 MG PO TB24
1000.0000 mg | ORAL_TABLET | Freq: Every day | ORAL | 3 refills | Status: DC
Start: 2023-02-08 — End: 2023-02-22

## 2023-02-08 MED ORDER — VALBENAZINE TOSYLATE 40 MG PO CAPS
40.0000 mg | ORAL_CAPSULE | Freq: Every day | ORAL | 3 refills | Status: DC
Start: 2023-02-08 — End: 2023-02-22

## 2023-02-08 MED ORDER — NICOTINE 14 MG/24HR TD PT24: 14.0000 mg | MEDICATED_PATCH | Freq: Every morning | TRANSDERMAL | 3 refills | Status: DC

## 2023-02-08 MED ORDER — HYDROXYZINE PAMOATE 25 MG PO CAPS
25.0000 mg | ORAL_CAPSULE | Freq: Three times a day (TID) | ORAL | 3 refills | Status: DC | PRN
Start: 2023-02-08 — End: 2023-02-22

## 2023-02-08 MED ORDER — HALOPERIDOL DECANOATE 100 MG/ML IM SOLN
100.0000 mg | INTRAMUSCULAR | 3 refills | Status: DC
Start: 2023-02-08 — End: 2023-02-22

## 2023-02-08 MED ORDER — DOXEPIN HCL 150 MG PO CAPS
150.0000 mg | ORAL_CAPSULE | Freq: Every day | ORAL | 3 refills | Status: DC
Start: 2023-02-08 — End: 2023-02-22

## 2023-02-08 MED ORDER — BENZTROPINE MESYLATE 1 MG PO TABS
1.0000 mg | ORAL_TABLET | Freq: Two times a day (BID) | ORAL | 3 refills | Status: DC
Start: 2023-02-08 — End: 2023-02-22

## 2023-02-08 NOTE — Progress Notes (Signed)
Patient arrived for injection of Haldol Decanoate100mg  given in Right Upper Outer Quadrant without issues or complaints. No side effects noted from medication. No SI/HI or AV hallucinations. Pleasant and cooperative.

## 2023-02-08 NOTE — Progress Notes (Addendum)
02/08/2023 11:02 AM Anita Hill  MRN:  [id number]  Chief Complaint: "I need help sleeping"  HPI: 52 year old female seen today for follow-up psychiatric evaluation.  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  Currently she is managed on Depakote 1000 mg nightly, doxepin 100 mg nightly, hydroxyzine 25 mg 3 times daily as needed, Haldol 100 mg injection every 28 days, Cogentin 1 mg twice daily, hydroxyzine 25 mg 3 times daily, and NicoDerm CQ 14 mg patches.  She reports her medications are somewhat effective in managing her psychiatric condition.  Today she was well-groomed, pleasant, cooperative, engaged in conversation.  Patient informed Clinical research associate that she is having problems sleeping.  She notes that she sleeps approximately 4 hours nightly.  Patient informed writer that she at times is awoken to clenching her jaws at night.  She also informed writer that at times she has abnormal muscle movements in her face and hands.  No abnormal movements noted today however Aims score of 5 given to patient based on her descriptions of abnormal movements.   Patient informed Clinical research associate that her anxiety and depression are well-managed.  Provider conducted a GAD-7 and patient scored a 4.  Provider also conducted a PHQ-9 and patient scored a 9.  She endorses increased appetite and weight.  She informed Clinical research associate that her PCP recommended that she start weight loss management.  Today she denies SI/HI/VAH, mania, paranoia.    Patient informed writer that in the past she was deemed incompetent by a judge.  She notes that she now feels mentally stable and reports that she wants to be deemed competent.  Provider informed her that she would have to go back to the judge to have this process done.  She endorsed understanding and agreed.  She also informed Clinical research associate that recently she has been in a lot of pain.  She notes  that she has chest pains due to having a mitral valve prolapse and knee pain.  She informed Clinical research associate that she needs her PCP to give her clearance to receive a handicap decal.    Today patient agreeable to increasing doxepin 100 mg to 150 mg to help manage sleep.  Patient given a 2-week sample of Ingrezza 40 mg to help abnormal muscle movements.  She will continue her other medications as prescribed.  No other concerns noted at this time.   Visit Diagnosis:    ICD-10-CM   1. Schizophrenia, unspecified type  F20.9 valbenazine (INGREZZA) 40 MG capsule    benztropine (COGENTIN) 1 MG tablet    divalproex (DEPAKOTE ER) 500 MG 24 hr tablet    doxepin (SINEQUAN) 150 MG capsule    haloperidol decanoate (HALDOL DECANOATE) 100 MG/ML injection    2. Insomnia due to mental condition  F51.05 doxepin (SINEQUAN) 150 MG capsule    3. Anxiety state  F41.1 hydrOXYzine (VISTARIL) 25 MG capsule    4. Cigarette nicotine dependence with nicotine-induced disorder  F17.219 nicotine (NICODERM CQ - DOSED IN MG/24 HOURS) 14 mg/24hr patch       Past Psychiatric History: schizophrenia, insomnia, anxiety, nicotine dependence, bipolar 1 disorder, and alcohol induced mood disorder.  Past Medical History:  Past Medical History:  Diagnosis Date   BIPOLAR DISORDER    Chest pain 01/20/2016   Delusions    Depression    Dyspnea 10/13/2012   Heart murmur    "MILD"   Homelessness    Left ventricular hypertrophy  Palpitations    Psychosis    Sickle cell trait    Substance abuse     Past Surgical History:  Procedure Laterality Date   BUNIONECTOMY     SKIN BIOPSY      Family Psychiatric History: none reported   Family History:  Family History  Problem Relation Age of Onset   Heart disease Mother        CHF   Prostate cancer Father    Colon polyps Neg Hx    Colon cancer Neg Hx    Crohn's disease Neg Hx    Esophageal cancer Neg Hx    Rectal cancer Neg Hx    Stomach cancer Neg Hx    Ulcerative colitis  Neg Hx     Social History:  Social History   Socioeconomic History   Marital status: Single    Spouse name: Not on file   Number of children: 2   Years of education: Not on file   Highest education level: Not on file  Occupational History    Comment: Unemployed  Tobacco Use   Smoking status: Every Day    Packs/day: 1.00    Years: 35.00    Additional pack years: 0.00    Total pack years: 35.00    Types: Cigarettes, Cigars    Start date: 11/02/1987    Passive exposure: Never   Smokeless tobacco: Never  Vaping Use   Vaping Use: Never used  Substance and Sexual Activity   Alcohol use: Yes    Comment: everyday 20-80oz   Drug use: Yes    Types: Cocaine    Comment: Previous cocaine, heroin, marijuana,CLEAN 21 YEARS UPDATED 09/06/22   Sexual activity: Not Currently  Other Topics Concern   Not on file  Social History Narrative   Not on file   Social Determinants of Health   Financial Resource Strain: Low Risk  (07/15/2021)   Overall Financial Resource Strain (CARDIA)    Difficulty of Paying Living Expenses: Not hard at all  Food Insecurity: No Food Insecurity (07/15/2021)   Hunger Vital Sign    Worried About Running Out of Food in the Last Year: Never true    Ran Out of Food in the Last Year: Never true  Transportation Needs: No Transportation Needs (07/15/2021)   PRAPARE - Administrator, Civil Service (Medical): No    Lack of Transportation (Non-Medical): No  Physical Activity: Not on file  Stress: Not on file  Social Connections: Not on file    Allergies:  Allergies  Allergen Reactions   Strawberry Extract Anaphylaxis   Risperidone And Related Hives, Nausea Only and Other (See Comments)    Made me feel jittery and restless/ Pt denies this allergy on 01/27/21   Latuda [Lurasidone Hcl] Other (See Comments)    Hot and cold flashes    Metabolic Disorder Labs: Lab Results  Component Value Date   HGBA1C 5.9 (H) 03/22/2022   MPG 125.5 05/08/2021   Lab  Results  Component Value Date   PROLACTIN 2.6 (L) 05/08/2021   Lab Results  Component Value Date   CHOL 213 (H) 03/22/2022   TRIG 250 (H) 03/22/2022   HDL 67 03/22/2022   CHOLHDL 3.2 03/22/2022   VLDL 13 05/08/2021   LDLCALC 104 (H) 03/22/2022   LDLCALC 78 05/08/2021   Lab Results  Component Value Date   TSH 3.290 03/22/2022   TSH 3.006 05/08/2021    Therapeutic Level Labs: No results found for: "LITHIUM" Lab Results  Component Value Date   VALPROATE 78 05/31/2022   VALPROATE <4 (L) 03/22/2022   Lab Results  Component Value Date   CBMZ <2.0 (L) 01/08/2008    Current Medications: Current Outpatient Medications  Medication Sig Dispense Refill   valbenazine (INGREZZA) 40 MG capsule Take 1 capsule (40 mg total) by mouth daily. 30 capsule 3   amLODipine (NORVASC) 5 MG tablet Take 1 tablet (5 mg total) by mouth at bedtime. 90 tablet 0   benztropine (COGENTIN) 1 MG tablet Take 1 tablet (1 mg total) by mouth 2 (two) times daily. Dose increased please DC cogentin 0.5 mg three times daily. 60 tablet 3   divalproex (DEPAKOTE ER) 500 MG 24 hr tablet Take 2 tablets (1,000 mg total) by mouth daily at 10 pm. 60 tablet 3   doxepin (SINEQUAN) 150 MG capsule Take 1 capsule (150 mg total) by mouth at bedtime. 30 capsule 3   haloperidol decanoate (HALDOL DECANOATE) 100 MG/ML injection Inject 1 mL (100 mg total) into the muscle every 30 (thirty) days. 1 mL 3   hydrOXYzine (VISTARIL) 25 MG capsule Take 1 capsule (25 mg total) by mouth 3 (three) times daily as needed. 60 capsule 3   meloxicam (MOBIC) 15 MG tablet Take 0.5-1 tablets (7.5-15 mg total) by mouth daily as needed for pain. 30 tablet 6   nicotine (NICODERM CQ - DOSED IN MG/24 HOURS) 14 mg/24hr patch Place 1 patch (14 mg total) onto the skin every morning. 30 patch 3   Current Facility-Administered Medications  Medication Dose Route Frequency Provider Last Rate Last Admin   haloperidol decanoate (HALDOL DECANOATE) 100 MG/ML injection  100 mg  100 mg Intramuscular Q30 days Toy Cookey E, NP   100 mg at 12/30/22 1341     Musculoskeletal: Strength & Muscle Tone: within normal limits Gait & Station: normal Patient leans: N/A  Psychiatric Specialty Exam: Review of Systems  Blood pressure (!) 132/97, pulse 93, temperature 98.4 F (36.9 C), weight 206 lb 3.2 oz (93.5 kg), SpO2 100 %.Body mass index is 37.71 kg/m.  General Appearance: Well Groomed  Eye Contact:  Good  Speech:  Clear and Coherent and Normal Rate  Volume:  Normal  Mood:  Euthymic  Affect:  Appropriate and Congruent  Thought Process:  Coherent, Goal Directed, and Linear  Orientation:  Full (Time, Place, and Person)  Thought Content: WDL and Logical   Suicidal Thoughts:  No  Homicidal Thoughts:  No  Memory:  Immediate;   Good Recent;   Good Remote;   Good  Judgement:  Good  Insight:  Good  Psychomotor Activity:  Normal  Concentration:  Concentration: Good and Attention Span: Good  Recall:  Good  Fund of Knowledge: Good  Language: Good  Akathisia:  No  Handed:  Right  AIMS (if indicated): Not done  Assets:  Communication Skills Desire for Improvement Financial Resources/Insurance Housing Leisure Time Physical Health Social Support  ADL's:  Intact  Cognition: WNL  Sleep:  Fair   Screenings: AIMS    Flowsheet Row Office Visit from 02/08/2023 in [facility] Office Visit from 09/02/2022 in [facility] Office Visit from 04/08/2022 in [facility] Office Visit from 02/09/2022 in [facility]Dis[facility]  AIMS Total Score 0 3 0 0 0      AUDIT    Flowsheet Row Ad[facility] (Discharged[facility]  Alcohol Use Disorder Identification Test Final Score (AUDIT) 5      GAD-7    Flowsheet Row Office Visit  from 02/08/2023 in [facility] Office Visit from 12/02/2022 in [facility] Office Visit from 09/02/2022 in [facility] Office Visit from 04/08/2022 in [facility]  Total GAD-7 Score 4 9 3  0      PHQ2-9    Flowsheet Row Office Visit from 02/08/2023 in [facility] Office Visit from 12/30/2022 in [facility] Office Visit from 12/02/2022 in [facility] Office Visit from 09/02/2022 in [facility] Office Visit from 06/28/2022 in [facility]  PHQ-2 Total Score 1 2 2  0 2  PHQ-9 Total Score 9 11 12 4 7       Flowsheet Row Office Visit from 04/08/2022 in [facility] Counselor from 11/17/2021 in [facility] ED from 07/01/2021 in [facility] Department at [facility]  C-SSRS RISK CATEGORY No Risk No Risk No Risk        Assessment and Plan: Patient reports that she is having abnormal muscle movements in her face and hands.  She also notes that her sleep has been poor.  Today she is agreeable to starting Ingrezza 40 mg daily to help manage symptoms of TD.  She is also agreeable to increasing doxepin 100 mg to 150 mg to help manage sleep.  She will continue her other medications as prescribed. 1. Schizophrenia, unspecified type (HCC)  Continue- divalproex (DEPAKOTE ER) 500 MG 24 hr tablet; Take 2 tablets (1,000 mg total) by mouth daily at 10 pm.  Dispense: 60 tablet; Refill: 3 Continue- doxepin (SINEQUAN) 100 MG capsule; Take 1 capsule (100 mg total) by mouth at bedtime.  Dispense: 30 capsule; Refill: 3 Continue- haloperidol decanoate (HALDOL DECANOATE) 100 MG/ML injection; Inject 1 mL (100 mg total) into the muscle every 30 (thirty) days.  Dispense: 1 mL; Refill: 3 Increase- benztropine (COGENTIN) 1 MG  tablet; Take 2 tablets (2 mg total) by mouth 2 (two) times daily. Dose increased please DC cogentin 0.5 mg three times daily.  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  Dispense: 30 capsule; Refill: 3  3. Anxiety state  Continue- hydrOXYzine (VISTARIL) 25 MG capsule; Take 1 capsule (25 mg total) by mouth 3 (three) times daily as needed.  Dispense: 60 capsule; Refill: 3  4. Cigarette nicotine dependence with nicotine-induced disorder  Restart- nicotine (NICODERM CQ - DOSED IN MG/24 HOURS) 14 mg/24hr patch; Place 1 patch (14 mg total) onto the skin every morning.  Dispense: 14 patch; Refill: 3   Collaboration of Care: Collaboration of Care: Other provider involved in patient's care AEB PCP and shot clinic staff  Patient/Guardian was advised Release of Information must be obtained prior to any record release in order to collaborate their care with an outside provider. Patient/Guardian was advised if they have not already done so to contact the registration department to sign all necessary forms in order for [place] to release information regarding their care.   Consent: Patient/Guardian gives verbal consent for treatment and assignment of benefits for services provided during this visit. Patient/Guardian expressed understanding and agreed to proceed.   Follow-up in 3 months  Shanna CiscoBrittney E Isais Klipfel, NP 02/08/2023, 11:02 AM

## 2023-02-11 ENCOUNTER — Telehealth: Payer: Self-pay

## 2023-02-11 NOTE — Progress Notes (Signed)
Patient attempted to be outreached by Brianna Yarborough, PharmD Candidate on 4/12 to discuss hypertension. Left voicemail for patient to return our call at their convenience at [phone].   Brianna Yarborough, PharmD Candidate   Nechama Escutia, Pharm.D. PGY-2 Ambulatory Care Pharmacy Resident 

## 2023-02-22 ENCOUNTER — Ambulatory Visit (INDEPENDENT_AMBULATORY_CARE_PROVIDER_SITE_OTHER): Payer: Medicaid Other | Admitting: Psychiatry

## 2023-02-22 ENCOUNTER — Encounter (HOSPITAL_COMMUNITY): Payer: Self-pay

## 2023-02-22 DIAGNOSIS — Z7689 Persons encountering health services in other specified circumstances: Secondary | ICD-10-CM | POA: Diagnosis not present

## 2023-02-22 DIAGNOSIS — F411 Generalized anxiety disorder: Secondary | ICD-10-CM

## 2023-02-22 DIAGNOSIS — F209 Schizophrenia, unspecified: Secondary | ICD-10-CM

## 2023-02-22 DIAGNOSIS — F5105 Insomnia due to other mental disorder: Secondary | ICD-10-CM | POA: Diagnosis not present

## 2023-02-22 DIAGNOSIS — F17219 Nicotine dependence, cigarettes, with unspecified nicotine-induced disorders: Secondary | ICD-10-CM | POA: Diagnosis not present

## 2023-02-22 MED ORDER — VALBENAZINE TOSYLATE 40 MG PO CAPS: 40.0000 mg | ORAL_CAPSULE | Freq: Every day | ORAL | 3 refills | Status: DC

## 2023-02-22 MED ORDER — DOXEPIN HCL 150 MG PO CAPS
150.0000 mg | ORAL_CAPSULE | Freq: Every day | ORAL | 3 refills | Status: AC
Start: 2023-02-22 — End: ?

## 2023-02-22 MED ORDER — BENZTROPINE MESYLATE 1 MG PO TABS
1.0000 mg | ORAL_TABLET | Freq: Two times a day (BID) | ORAL | 3 refills | Status: DC
Start: 2023-02-22 — End: 2023-05-12

## 2023-02-22 MED ORDER — NICOTINE 14 MG/24HR TD PT24: 14.0000 mg | MEDICATED_PATCH | Freq: Every morning | TRANSDERMAL | 3 refills | Status: DC

## 2023-02-22 MED ORDER — HYDROXYZINE PAMOATE 25 MG PO CAPS: 25.0000 mg | ORAL_CAPSULE | Freq: Three times a day (TID) | ORAL | 3 refills | Status: DC | PRN

## 2023-02-22 MED ORDER — HALOPERIDOL DECANOATE 100 MG/ML IM SOLN
100.0000 mg | INTRAMUSCULAR | 3 refills | Status: DC
Start: 2023-02-22 — End: 2023-05-12

## 2023-02-22 MED ORDER — DIVALPROEX SODIUM ER 500 MG PO TB24
1000.0000 mg | ORAL_TABLET | Freq: Every day | ORAL | 3 refills | Status: DC
Start: 2023-02-22 — End: 2023-05-12

## 2023-02-22 NOTE — Progress Notes (Signed)
02/22/2023 3:55 PM Anita Hill  MRN:  [id number]  Chief Complaint: "I have knee pain but I'm okay"  HPI: 52 year old female seen today for follow-up psychiatric evaluation.  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  Currently she is managed on Depakote 1000 mg nightly, doxepin 150 mg nightly, hydroxyzine 25 mg 3 times daily as needed, Haldol 100 mg injection every 28 days, Cogentin 1 mg twice daily, hydroxyzine 25 mg 3 times daily, Ingrezza 40 mg daily, and NicoDerm CQ 14 mg patches.  She reports her medications are effective in managing her psychiatric condition.  Today she was well-groomed, pleasant, cooperative, engaged in conversation.  Patient informed writer that she continue to have knee pain which she quantifies at a 10 out of 10.  She informed Clinical research associate that she receives cortisone injections and notes they are somewhat effective in managing her pain.  Since starting Ingrezza patient notes that her TD has drastically improved.  She informed Clinical research associate that at times she continues to clench her teeth but reports that  this is not related to TD.  She informed Clinical research associate that she has some scars on her tongue and inner cheeks because of her clenching.  She reports that she will be seeing a dentist about this soon.  Patient also informed writer that she has abnormal movements in her hand but none displayed today.  She informed Clinical research associate that his [name]  has drastically been improved this.    Overall she notes that her mood is stable and notes she has minimal anxiety and depression.  Provider did not conduct a GAD-7 or PHQ-9 today as these assessments are recently done.  Today she denies SI/HI/VAH, mania, paranoia.  No medication changes made today.  Patient agreeable to continue medication as prescribed.  No other concerns noted at this time.   Visit Diagnosis:  No diagnosis  found.    Past Psychiatric History: schizophrenia, insomnia, anxiety, nicotine dependence, bipolar 1 disorder, and alcohol induced mood disorder.  Past Medical History:  Past Medical History:  Diagnosis Date   BIPOLAR DISORDER    Chest pain 01/20/2016   Delusions    Depression    Dyspnea 10/13/2012   Heart murmur    "MILD"   Homelessness    Left ventricular hypertrophy    Palpitations    Psychosis    Sickle cell trait    Substance abuse     Past Surgical History:  Procedure Laterality Date   BUNIONECTOMY     SKIN BIOPSY      Family Psychiatric History: none reported   Family History:  Family History  Problem Relation Age of Onset   Heart disease Mother        CHF   Prostate cancer Father    Colon polyps Neg Hx    Colon cancer Neg Hx    Crohn's disease Neg Hx    Esophageal cancer Neg Hx    Rectal cancer Neg Hx    Stomach cancer Neg Hx    Ulcerative colitis Neg Hx     Social History:  Social History   Socioeconomic History   Marital status: Single    Spouse name: Not on file   Number of children: 2   Years of education: Not on file   Highest education level: Not on file  Occupational History    Comment: Unemployed  Tobacco Use   Smoking status: Every Day  Packs/day: 1.00    Years: 35.00    Additional pack years: 0.00    Total pack years: 35.00    Types: Cigarettes, Cigars    Start date: 11/02/1987    Passive exposure: Never   Smokeless tobacco: Never  Vaping Use   Vaping Use: Never used  Substance and Sexual Activity   Alcohol use: Yes    Comment: everyday 20-80oz   Drug use: Yes    Types: Cocaine    Comment: Previous cocaine, heroin, marijuana,CLEAN 21 YEARS UPDATED 09/06/22   Sexual activity: Not Currently  Other Topics Concern   Not on file  Social History Narrative   Not on file   Social Determinants of Health   Financial Resource Strain: Low Risk  (07/15/2021)   Overall Financial Resource Strain (CARDIA)    Difficulty of Paying  Living Expenses: Not hard at all  Food Insecurity: No Food Insecurity (07/15/2021)   Hunger Vital Sign    Worried About Running Out of Food in the Last Year: Never true    Ran Out of Food in the Last Year: Never true  Transportation Needs: No Transportation Needs (07/15/2021)   PRAPARE - Administrator, Civil Service (Medical): No    Lack of Transportation (Non-Medical): No  Physical Activity: Not on file  Stress: Not on file  Social Connections: Not on file    Allergies:  Allergies  Allergen Reactions   Strawberry Extract Anaphylaxis   Risperidone And Related Hives, Nausea Only and Other (See Comments)    Made me feel jittery and restless/ Pt denies this allergy on 01/27/21   Latuda [Lurasidone Hcl] Other (See Comments)    Hot and cold flashes    Metabolic Disorder Labs: Lab Results  Component Value Date   HGBA1C 5.9 (H) 03/22/2022   MPG 125.5 05/08/2021   Lab Results  Component Value Date   PROLACTIN 2.6 (L) 05/08/2021   Lab Results  Component Value Date   CHOL 213 (H) 03/22/2022   TRIG 250 (H) 03/22/2022   HDL 67 03/22/2022   CHOLHDL 3.2 03/22/2022   VLDL 13 05/08/2021   LDLCALC 104 (H) 03/22/2022   LDLCALC 78 05/08/2021   Lab Results  Component Value Date   TSH 3.290 03/22/2022   TSH 3.006 05/08/2021    Therapeutic Level Labs: No results found for: "LITHIUM" Lab Results  Component Value Date   VALPROATE 78 05/31/2022   VALPROATE <4 (L) 03/22/2022   Lab Results  Component Value Date   CBMZ <2.0 (L) 01/08/2008    Current Medications: Current Outpatient Medications  Medication Sig Dispense Refill   amLODipine (NORVASC) 5 MG tablet Take 1 tablet (5 mg total) by mouth at bedtime. 90 tablet 0   benztropine (COGENTIN) 1 MG tablet Take 1 tablet (1 mg total) by mouth 2 (two) times daily. Dose increased please DC cogentin 0.5 mg three times daily. 60 tablet 3   divalproex (DEPAKOTE ER) 500 MG 24 hr tablet Take 2 tablets (1,000 mg total) by mouth  daily at 10 pm. 60 tablet 3   doxepin (SINEQUAN) 150 MG capsule Take 1 capsule (150 mg total) by mouth at bedtime. 30 capsule 3   haloperidol decanoate (HALDOL DECANOATE) 100 MG/ML injection Inject 1 mL (100 mg total) into the muscle every 30 (thirty) days. 1 mL 3   hydrOXYzine (VISTARIL) 25 MG capsule Take 1 capsule (25 mg total) by mouth 3 (three) times daily as needed. 60 capsule 3   meloxicam (MOBIC) 15 MG  tablet Take 0.5-1 tablets (7.5-15 mg total) by mouth daily as needed for pain. 30 tablet 6   nicotine (NICODERM CQ - DOSED IN MG/24 HOURS) 14 mg/24hr patch Place 1 patch (14 mg total) onto the skin every morning. 30 patch 3   valbenazine (INGREZZA) 40 MG capsule Take 1 capsule (40 mg total) by mouth daily. 30 capsule 3   Current Facility-Administered Medications  Medication Dose Route Frequency Provider Last Rate Last Admin   haloperidol decanoate (HALDOL DECANOATE) 100 MG/ML injection 100 mg  100 mg Intramuscular Q30 days Toy Cookey E, NP   100 mg at 02/08/23 1308     Musculoskeletal: Strength & Muscle Tone: within normal limits Gait & Station: normal Patient leans: N/A  Psychiatric Specialty Exam: Review of Systems  There were no vitals taken for this visit.There is no height or weight on file to calculate BMI.  General Appearance: Well Groomed  Eye Contact:  Good  Speech:  Clear and Coherent and Normal Rate  Volume:  Normal  Mood:  Euthymic  Affect:  Appropriate and Congruent  Thought Process:  Coherent, Goal Directed, and Linear  Orientation:  Full (Time, Place, and Person)  Thought Content: WDL and Logical   Suicidal Thoughts:  No  Homicidal Thoughts:  No  Memory:  Immediate;   Good Recent;   Good Remote;   Good  Judgement:  Good  Insight:  Good  Psychomotor Activity:  Normal  Concentration:  Concentration: Good and Attention Span: Good  Recall:  Good  Fund of Knowledge: Good  Language: Good  Akathisia:  No  Handed:  Right  AIMS (if indicated): Not done   Assets:  Communication Skills Desire for Improvement Financial Resources/Insurance Housing Leisure Time Physical Health Social Support  ADL's:  Intact  Cognition: WNL  Sleep:  Fair   Screenings: AIMS    Flowsheet Row Office Visit from 02/08/2023 in [facility] Office Visit from 09/02/2022 in [facility] Office Visit from 04/08/2022 in [facility] Office Visit from 02/09/2022 in [facility]  AIMS Total Score 5 3 0 0 0      AUDIT    Flowsheet Row Ad[facility] (Discharged[facility]  Alcohol Use Disorder Identification Test Final Score (AUDIT) 5      GAD-7    Flowsheet Row Office Visit from 02/08/2023 in [facility] Office Visit from 12/02/2022 in [facility] Office Visit from 09/02/2022 in [facility] Office Visit from 04/08/2022 in [facility]  Total GAD-7 Score 4 9 3  0      PHQ2-9    Flowsheet Row Office Visit from 02/08/2023 in [facility] Office Visit from 12/30/2022 in [facility] Office Visit from 12/02/2022 in [facility] Office Visit from 09/02/2022 in [facility] Office Visit from 06/28/2022 in [facility]  PHQ-2 Total Score 1 2 2  0 2  PHQ-9 Total Score 9 11 12 4 7       Flowsheet Row Office Visit from 04/08/2022 in [facility] Counselor from 11/17/2021 in [facility] ED from 07/01/2021 in [facility] Emergency Department at [facility]  C-SSRS RISK CATEGORY No Risk No Risk No Risk  Assessment and Plan: Patient reports  that she is having abnormal muscle movements in her face and hands.  She also notes that her sleep has been poor.  Today she is agreeable to starting Ingrezza 40 mg daily to help manage symptoms of TD.  She is also agreeable to increasing doxepin 100 mg to 150 mg to help manage sleep.  She will continue her other medications as prescribed. 1. Schizophrenia, unspecified type (HCC)  Continue- divalproex (DEPAKOTE ER) 500 MG 24 hr tablet; Take 2 tablets (1,000 mg total) by mouth daily at 10 pm.  Dispense: 60 tablet; Refill: 3 Continue- doxepin (SINEQUAN) 100 MG capsule; Take 1 capsule (100 mg total) by mouth at bedtime.  Dispense: 30 capsule; Refill: 3 Continue- haloperidol decanoate (HALDOL DECANOATE) 100 MG/ML injection; Inject 1 mL (100 mg total) into the muscle every 30 (thirty) days.  Dispense: 1 mL; Refill: 3 Increase- benztropine (COGENTIN) 1 MG tablet; Take 2 tablets (2 mg total) by mouth 2 (two) times daily. Dose increased please DC cogentin 0.5 mg three times daily.  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  Dispense: 30 capsule; Refill: 3  3. Anxiety state  Continue- hydrOXYzine (VISTARIL) 25 MG capsule; Take 1 capsule (25 mg total) by mouth 3 (three) times daily as needed.  Dispense: 60 capsule; Refill: 3  4. Cigarette nicotine dependence with nicotine-induced disorder  Restart- nicotine (NICODERM CQ - DOSED IN MG/24 HOURS) 14 mg/24hr patch; Place 1 patch (14 mg total) onto the skin every morning.  Dispense: 14 patch; Refill: 3   Collaboration of Care: Collaboration of Care: Other provider involved in patient's care AEB PCP and shot clinic staff  Patient/Guardian was advised Release of Information must be obtained prior to any record release in order to collaborate their care with an outside provider. Patient/Guardian was advised if they have not already done so  to contact the registration department to sign all necessary forms in order for [place] to release information regarding their care.   Consent: Patient/Guardian gives verbal consent for treatment and assignment of benefits for services provided during this visit. Patient/Guardian expressed understanding and agreed to proceed.   Follow-up in 3 months   Shanna Cisco, NP 02/22/2023, 3:55 PM

## 2023-03-01 ENCOUNTER — Telehealth: Payer: Self-pay

## 2023-03-01 NOTE — Telephone Encounter (Signed)
Patient LVM on nurse line regarding bug bite that occurred yesterday. Reports that she has broken out in hives all over body.   Spoke with Dr. Miquel Dunn regarding medication management. Advised that patient could take OTC benadryl and to provide with UC/ED precautions.   Returned call to patient. She reports that she has a red, itchy rash all over body. Since being bitten she does report difficulty swallowing. No SHOB or CP at this time.   Due to swallowing concern, recommended that patient be evaluated as soon as possible. Patient states that she will proceed to ED for evaluation.   Veronda Prude, RN

## 2023-03-07 ENCOUNTER — Other Ambulatory Visit: Payer: Self-pay | Admitting: Pharmacist

## 2023-03-07 NOTE — Progress Notes (Addendum)
   Anita Hill 08-09-1971 [id number]    Blood Pressure Readings: Last documented ambulatory systolic blood pressure: 132 Last documented ambulatory diastolic blood pressure: 97 Does the patient have a validated home blood pressure machine?: Yes They report home readings - daughter reports she has not been taking her readings  Medication review was performed. Is the patient taking their medications as prescribed?: NoDifferences from their prescribed list include: Contacted [facility] and confirmed they are filling benztropine, divalproex, doxepin, hydroxyzine, nicotine, and haloperidol injection to be administered at office.      CORRENA UMEDA 05-28-1971 [id number]   Daughter states she pays for pts medications out of pocket and has trouble affording them. Will follow up about medication assistance program.     Patient outreached by Sharman Cheek, PharmD Candidate on 03/07/23 while they were picking up prescriptions at [facility].  Blood Pressure Readings: Last documented ambulatory systolic blood pressure: 132 Last documented ambulatory diastolic blood pressure: 97 Does the patient have a validated home blood pressure machine?: Yes but she has not been checking   Medication review was performed. Is the patient taking their medications as prescribed?: No Differences from their prescribed list include: they do not have amlodipine in package  The following barriers to adherence were noted: Does the patient have cost concerns?: Yes Does the patient have transportation concerns?: No Does the patient need assistance obtaining refills?: Yes Does the patient occassionally forget to take some of their prescribed medications?: No Does the patient feel like one/some of their medications make them feel poorly?: No Does the patient have questions or concerns about their medications?: No Does the patient have a follow up scheduled with their primary care  provider/cardiologist?: No   Interventions: Interventions Completed: Medications were reviewed, Patient was educated on how to access home blood pressure machine, Patient was educated on proper technique to check home blood pressure and reminded to bring home machine and readings to next provider appointment  Her daughter notes they will start checking BP at home.   The patient has follow up scheduled:  PCP: Evelena Leyden, DO   Catie Clearance Coots, RPH-CPP

## 2023-03-08 ENCOUNTER — Ambulatory Visit: Payer: Medicaid Other | Admitting: Family Medicine

## 2023-03-08 DIAGNOSIS — Z7689 Persons encountering health services in other specified circumstances: Secondary | ICD-10-CM | POA: Diagnosis not present

## 2023-03-10 ENCOUNTER — Ambulatory Visit (HOSPITAL_COMMUNITY): Payer: Medicaid Other

## 2023-03-22 ENCOUNTER — Ambulatory Visit (HOSPITAL_COMMUNITY): Payer: Medicaid Other

## 2023-03-24 ENCOUNTER — Ambulatory Visit (INDEPENDENT_AMBULATORY_CARE_PROVIDER_SITE_OTHER): Payer: Medicaid Other | Admitting: Clinical

## 2023-03-24 DIAGNOSIS — F209 Schizophrenia, unspecified: Secondary | ICD-10-CM

## 2023-03-24 NOTE — Progress Notes (Signed)
THERAPIST PROGRESS NOTE Virtual Visit via Video Note  I connected with Anita Hill on 03/24/23 at  8:00 AM EDT by a video enabled telemedicine application and verified that I am speaking with the correct person using two identifiers.  Location: Patient: home Provider: office   I discussed the limitations of evaluation and management by telemedicine and the availability of in person appointments. The patient expressed understanding and agreed to proceed.   Follow Up Instructions: I discussed the assessment and treatment plan with the patient. The patient was provided an opportunity to ask questions and all were answered. The patient agreed with the plan and demonstrated an understanding of the instructions.   The patient was advised to call back or seek an in-person evaluation if the symptoms worsen or if the condition fails to improve as anticipated.   Session Time: 45 minutes  Participation Level: Active  Behavioral Response: NAAlertEuthymic  Type of Therapy: Individual Therapy  Treatment Goals addressed: client will complete 80% of assigned homework  ProgressTowards Goals: Progressing  Interventions: CBT and Supportive  Summary:  Anita Hill is a 52 y.o. female who presents for the scheduled appointment oriented x 5 and cooperative.  Client had difficulty connecting via video so the remainder was conducted via audio.  Client denied hallucinations and delusions. Client reported today she is doing fairly well.  Client reported however she has been experiencing some side effects she believes are coming from her psychiatric medications.  Client reported experiencing involuntary hand movements, foggy brain, and feeling physically off balance.  Client reported she has a medication Ingrezza that is helpful but she still has foggy brain and feeling off balance.  Client reported she will discuss her ongoing concerns with the psychiatrist at her next appointment.  Client  reported she is also in regular communication with her other physical health provider. Client reported things at home are the same. Client reported she has grown accustomed to her daughters behavior and she lets her daughter do most of the talking. Client reported she feels her daughter is coercing the doctor to be on the side of stating her mother will be dependent for the rest of her life. Client reported she is continuously working on acceptance that there are things that will not go her way. Client reported she is trying to stay motivated to keep occupied so she does not get used to sleeping during the day. Client reported it is hard because she does not have a way for accessible transportation at home. Evidence of progress towards goal:  client reported 1 positive of finding some activities to do to keep her busy during the week at least 4 days out of the week.  Suicidal/Homicidal: Nowithout intent/plan  Therapist Response:  Therapist began the appointment asking the client how she has been doing since last seen. Therapist used CBT to engage using active listening and positive emotional support. Therapist used CBT to engage giving the her time to discuss stressors related to medication regimen and some solutions. Therapist used CBT to give the client time to discuss her thoughts on home life, health, and finding activity. Therapist used CBT to positively reinforce the clients process of being intentional to reframe her negative thoughts. Therapist used CBT ask the client to identify her progress with frequency of use with coping skills with continued practice in her daily activity.    Therapist assigned the client homework to practice self care.     Plan: Return again in 3 weeks.  Diagnosis: schizophrenia, unspecified  Collaboration of Care: Patient refused AEB none requested by the client.  Patient/Guardian was advised Release of Information must be obtained prior to any record release in  order to collaborate their care with an outside provider. Patient/Guardian was advised if they have not already done so to contact the registration department to sign all necessary forms in order for [place] to release information regarding their care.   Consent: Patient/Guardian gives verbal consent for treatment and assignment of benefits for services provided during this visit. Patient/Guardian expressed understanding and agreed to proceed.   Neena Rhymes Dareion Kneece, LCSW 03/24/2023

## 2023-03-29 DIAGNOSIS — Z0389 Encounter for observation for other suspected diseases and conditions ruled out: Secondary | ICD-10-CM | POA: Diagnosis not present

## 2023-04-01 ENCOUNTER — Telehealth (HOSPITAL_COMMUNITY): Payer: Self-pay | Admitting: *Deleted

## 2023-04-01 NOTE — Telephone Encounter (Signed)
Last seen on 02/08/23 for her Haldol Inj. Called her and left VM for her to come in during clinic times or to call if needs an exception for her next shot.

## 2023-04-06 DIAGNOSIS — M79662 Pain in left lower leg: Secondary | ICD-10-CM | POA: Diagnosis not present

## 2023-04-06 DIAGNOSIS — M7989 Other specified soft tissue disorders: Secondary | ICD-10-CM | POA: Diagnosis not present

## 2023-04-06 DIAGNOSIS — M79605 Pain in left leg: Secondary | ICD-10-CM | POA: Diagnosis not present

## 2023-04-06 DIAGNOSIS — M79604 Pain in right leg: Secondary | ICD-10-CM | POA: Diagnosis not present

## 2023-04-06 DIAGNOSIS — I87393 Chronic venous hypertension (idiopathic) with other complications of bilateral lower extremity: Secondary | ICD-10-CM | POA: Diagnosis not present

## 2023-04-06 DIAGNOSIS — M79661 Pain in right lower leg: Secondary | ICD-10-CM | POA: Diagnosis not present

## 2023-04-06 DIAGNOSIS — Z7689 Persons encountering health services in other specified circumstances: Secondary | ICD-10-CM | POA: Diagnosis not present

## 2023-04-18 ENCOUNTER — Ambulatory Visit (HOSPITAL_COMMUNITY): Payer: Medicaid Other | Admitting: Clinical

## 2023-04-26 ENCOUNTER — Ambulatory Visit (HOSPITAL_COMMUNITY): Payer: Medicaid Other

## 2023-05-06 ENCOUNTER — Other Ambulatory Visit: Payer: Self-pay | Admitting: Family Medicine

## 2023-05-12 ENCOUNTER — Encounter (HOSPITAL_COMMUNITY): Payer: Self-pay

## 2023-05-12 ENCOUNTER — Ambulatory Visit (HOSPITAL_COMMUNITY): Payer: MEDICAID | Admitting: Psychiatry

## 2023-05-12 ENCOUNTER — Ambulatory Visit (HOSPITAL_COMMUNITY): Payer: MEDICAID

## 2023-05-12 DIAGNOSIS — F5105 Insomnia due to other mental disorder: Secondary | ICD-10-CM | POA: Diagnosis not present

## 2023-05-12 DIAGNOSIS — F17219 Nicotine dependence, cigarettes, with unspecified nicotine-induced disorders: Secondary | ICD-10-CM

## 2023-05-12 DIAGNOSIS — F209 Schizophrenia, unspecified: Secondary | ICD-10-CM

## 2023-05-12 DIAGNOSIS — F411 Generalized anxiety disorder: Secondary | ICD-10-CM

## 2023-05-12 MED ORDER — HALOPERIDOL DECANOATE 100 MG/ML IM SOLN
100.0000 mg | INTRAMUSCULAR | 3 refills | Status: DC
Start: 2023-05-12 — End: 2023-11-16

## 2023-05-12 MED ORDER — VALBENAZINE TOSYLATE 40 MG PO CAPS
40.0000 mg | ORAL_CAPSULE | Freq: Every day | ORAL | 3 refills | Status: DC
Start: 2023-05-12 — End: 2023-08-18

## 2023-05-12 MED ORDER — BENZTROPINE MESYLATE 1 MG PO TABS
1.0000 mg | ORAL_TABLET | Freq: Two times a day (BID) | ORAL | 3 refills | Status: DC
Start: 2023-05-12 — End: 2023-05-12

## 2023-05-12 MED ORDER — BUPROPION HCL ER (XL) 150 MG PO TB24
150.0000 mg | ORAL_TABLET | ORAL | 3 refills | Status: DC
Start: 2023-05-12 — End: 2023-08-18

## 2023-05-12 MED ORDER — NICOTINE 21 MG/24HR TD PT24: 21.0000 mg | MEDICATED_PATCH | Freq: Every morning | TRANSDERMAL | 3 refills | Status: DC

## 2023-05-12 MED ORDER — DIVALPROEX SODIUM ER 500 MG PO TB24
1000.0000 mg | ORAL_TABLET | Freq: Every day | ORAL | 3 refills | Status: DC
Start: 2023-05-12 — End: 2023-08-18

## 2023-05-12 MED ORDER — DOXEPIN HCL 150 MG PO CAPS
150.0000 mg | ORAL_CAPSULE | Freq: Every day | ORAL | 3 refills | Status: DC
Start: 2023-05-12 — End: 2023-08-18

## 2023-05-12 MED ORDER — HYDROXYZINE PAMOATE 25 MG PO CAPS: 25.0000 mg | ORAL_CAPSULE | Freq: Three times a day (TID) | ORAL | 3 refills | Status: DC | PRN

## 2023-05-12 NOTE — Progress Notes (Cosign Needed)
Patient arrived with her daughter who is her legal guardian for injection of Haldol 100mg . Given in Left upper outer quadrant without issues or complaints. Her daughter discussed how she has missed bringing her mother to her appointments due to working night shift but notices a big difference when she does not receive the injection. She starts seeing people and hearing voices that are not there. Patient is pleasant and cooperative with bright affect.
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
## 2023-05-18 ENCOUNTER — Telehealth (HOSPITAL_COMMUNITY): Payer: Self-pay

## 2023-05-18 DIAGNOSIS — Z0389 Encounter for observation for other suspected diseases and conditions ruled out: Secondary | ICD-10-CM | POA: Diagnosis not present

## 2023-05-18 NOTE — Telephone Encounter (Signed)
Medication management - Prior authorization for patient's prescribed Ingrezza 40 mg submitted online with CoverMyMeds to patient's [employer] plan for review and approval.

## 2023-05-19 ENCOUNTER — Telehealth (HOSPITAL_COMMUNITY): Payer: Self-pay | Admitting: *Deleted

## 2023-05-19 NOTE — Telephone Encounter (Signed)
Fax received for denial of Ingrezza. Appeal process will be started.

## 2023-05-19 NOTE — Telephone Encounter (Signed)
Thank you for this update 

## 2023-05-24 ENCOUNTER — Telehealth (HOSPITAL_COMMUNITY): Payer: Self-pay | Admitting: *Deleted

## 2023-05-24 NOTE — Telephone Encounter (Signed)
Called to do appeal and was told that the patient must sign the appeal form before an appeal could be done. Called to notify patient that the form is in the office awaiting her signature should she choose to appeal the decision. No answer, left VM to return call.

## 2023-05-30 NOTE — Telephone Encounter (Signed)
Thank you for this update 

## 2023-06-09 ENCOUNTER — Ambulatory Visit (HOSPITAL_COMMUNITY): Payer: MEDICAID

## 2023-06-10 ENCOUNTER — Ambulatory Visit (INDEPENDENT_AMBULATORY_CARE_PROVIDER_SITE_OTHER): Payer: MEDICAID

## 2023-06-10 ENCOUNTER — Encounter (HOSPITAL_COMMUNITY): Payer: Self-pay

## 2023-06-10 ENCOUNTER — Telehealth (HOSPITAL_COMMUNITY): Payer: Self-pay

## 2023-06-10 VITALS — BP 126/95 | HR 110 | Temp 98.6°F | Ht 63.5 in | Wt 210.0 lb

## 2023-06-10 DIAGNOSIS — F209 Schizophrenia, unspecified: Secondary | ICD-10-CM | POA: Diagnosis not present
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
## 2023-06-10 NOTE — Progress Notes (Signed)
Patient in today for due Haldol Decanoate 100 mg/ml Im injection every 30 days.with her daughter.  Patient denied any auditory or visual hallucinations, no suicidal or homicidal ideations, no plans, intent, or reported means to want to harm self or others at this time.  Patient presented with appropriate affect, level and pleasant mood, dressed nicely and stated no concerns with current medication regimen.  Patient's blood pressure slightly elevated today as patient stated she had just taken her medication this morning and would also be seeing her PCP in the coming week.  Patient reported no other symptoms or problems at this time and patient's daughter reported none either except that they like to bicker with each other sometimes.  Patient's due Haldol Decanoate 100 mg/ml IM injection prepared as ordered and given to patient in her right upper outer gluteal area.  Patient tolerated due injection without any  compliant of pain or discomfort and agreed to return again in 30 days for her next injection.  Patient to call if any issues or concerns prior to next appointment.

## 2023-06-13 ENCOUNTER — Ambulatory Visit (INDEPENDENT_AMBULATORY_CARE_PROVIDER_SITE_OTHER): Payer: MEDICAID | Admitting: Clinical

## 2023-06-13 DIAGNOSIS — F209 Schizophrenia, unspecified: Secondary | ICD-10-CM

## 2023-06-13 NOTE — Progress Notes (Signed)
THERAPIST PROGRESS NOTE Virtual Visit via Telephone Note  I connected with Anita Hill on 06/13/23 at  2:00 PM EDT by telephone and verified that I am speaking with the correct person using two identifiers.  Location: Patient: home Provider: office   I discussed the limitations, risks, security and privacy concerns of performing an evaluation and management service by telephone and the availability of in person appointments. I also discussed with the patient that there may be a patient responsible charge related to this service. The patient expressed understanding and agreed to proceed.   Follow Up Instructions: I discussed the assessment and treatment plan with the patient. The patient was provided an opportunity to ask questions and all were answered. The patient agreed with the plan and demonstrated an understanding of the instructions.   The patient was advised to call back or seek an in-person evaluation if the symptoms worsen or if the condition fails to improve as anticipated.   Session Time: 30 minutes  Participation Level: Active  Behavioral Response: NAAlertEuthymic  Type of Therapy: Individual Therapy  Treatment Goals addressed: Client will participate in at least 80% of individual psychotherapy sessions  ProgressTowards Goals: Progressing  Interventions: CBT and Supportive  Summary:  Anita Hill is a 52 y.o. female who presents for the scheduled appointment oriented x 5 and friendly.  Client denied hallucinations and delusions. Client reported things have been going pretty well.  Client reported she is pleased to say that she will be starting school at [employer] this week.  Client reported she will be doing a placement test to see how far along she is and her education.  Client reported she has had difficulty with getting transcripts from her prior education from up [place].  Client reported she is somewhat nervous about school but she is hopeful to get connected  with other people and have that social interaction.  Client reported the past 2 years have been challenging for her because she has seen everyone going on to be successful and she has not been able to get herself established as she would like.  Client reported she would like to have new friendships in her life with females.  Client reported the few friends that she does speak with who are female do not seem to genuinely support her and/or try to make the relationship sexual which she does not want.  Client reported the relationship with her daughter has been going fairly well.  Client reported she tries not to bother her too much.  Client reported medication management has been working well and the [name] has helped to stop the side effects of the twitching that she was experiencing. Evidence of progress towards goal: Client reported she is medication compliant 7 days a week.  Client reported 1 accomplish goal of being successfully enrolled in school to work towards her GED.  Suicidal/Homicidal: Nowithout intent/plan  Therapist Response:  Therapist began the appointment asking the client how she has been doing since last seen. Therapist used CBT to engage using active listening and positive emotional support. Therapist used CBT to ask the client to discuss any psychosocial stressors and/or overall changes that have impacted her mood. Therapist used CBT to normalize client's thought process and positively encouraged her on accomplishing one of her goals. Therapist used CBT to teach and discussed with the client about positive reframing of anxious thoughts. Therapist used CBT ask the client to identify her progress with frequency of use with coping skills with continued  practice in her daily activity.    Therapist assigned client homework to practice self-care.   Plan: Return again in 5 weeks.  Diagnosis: Schizophrenia unspecified type  Collaboration of Care: Patient refused AEB none requested by  the client at this time.  Patient/Guardian was advised Release of Information must be obtained prior to any record release in order to collaborate their care with an outside provider. Patient/Guardian was advised if they have not already done so to contact the registration department to sign all necessary forms in order for [place] to release information regarding their care.   Consent: Patient/Guardian gives verbal consent for treatment and assignment of benefits for services provided during this visit. Patient/Guardian expressed understanding and agreed to proceed.   [name], LCSW 06/13/2023

## 2023-06-22 DIAGNOSIS — Z0389 Encounter for observation for other suspected diseases and conditions ruled out: Secondary | ICD-10-CM | POA: Diagnosis not present

## 2023-07-05 ENCOUNTER — Ambulatory Visit (INDEPENDENT_AMBULATORY_CARE_PROVIDER_SITE_OTHER): Payer: Medicaid Other | Admitting: Family Medicine

## 2023-07-05 ENCOUNTER — Encounter: Payer: Self-pay | Admitting: Family Medicine

## 2023-07-05 DIAGNOSIS — Z720 Tobacco use: Secondary | ICD-10-CM

## 2023-07-05 DIAGNOSIS — I10 Essential (primary) hypertension: Secondary | ICD-10-CM

## 2023-07-05 DIAGNOSIS — F29 Unspecified psychosis not due to a substance or known physiological condition: Secondary | ICD-10-CM

## 2023-07-05 NOTE — Progress Notes (Deleted)
   Complete physical exam  Patient: Anita Hill   DOB: 08/21/2000   52 y.o. Female  MRN: [id number]  Subjective:    No chief complaint on file.   Anita Hill is a 52 y.o. female who presents today for a complete physical exam. She reports consuming a {diet types:17450} diet. {types:19826} She generally feels {DESC; WELL/FAIRLY WELL/POORLY:18703}. She reports sleeping {DESC; WELL/FAIRLY WELL/POORLY:18703}. She {does/does not:200015} have additional problems to discuss today.    Most recent fall risk assessment:    04/28/2022   10:42 AM  Fall Risk   Falls in the past year? 0  Number falls in past yr: 0  Injury with Fall? 0  Risk for fall due to : No Fall Risks  Follow up Falls evaluation completed     Most recent depression screenings:    04/28/2022   10:42 AM 03/19/2021   10:46 AM  PHQ 2/9 Scores  PHQ - 2 Score 0 0  PHQ- 9 Score 5     {VISON DENTAL STD PSA (Optional):27386}  {History (Optional):23778}  Patient Care Team: Jessup, Joy, NP as PCP - General (Nurse Practitioner)   Outpatient Medications Prior to Visit  Medication Sig   fluticasone (FLONASE) 50 MCG/ACT nasal spray Place 2 sprays into both nostrils in the morning and at bedtime. After 7 days, reduce to once daily.   norgestimate-ethinyl estradiol (SPRINTEC 28) 0.25-35 MG-MCG tablet Take 1 tablet by mouth daily.   Nystatin POWD Apply liberally to affected area 2 times per day   spironolactone (ALDACTONE) 100 MG tablet Take 1 tablet (100 mg total) by mouth daily.   No facility-administered medications prior to visit.    ROS        Objective:     There were no vitals taken for this visit. {Vitals History (Optional):23777}  Physical Exam   No results found for any visits on 06/03/22. {Show previous labs (optional):23779}    Assessment & Plan:    Routine Health Maintenance and Physical Exam  Immunization History  Administered Date(s) Administered   DTaP 11/04/1999, 12/31/1999,  03/10/2000, 11/24/2000, 06/09/2004   Hepatitis A 04/05/2008, 04/11/2009   Hepatitis B 08/22/1999, 09/29/1999, 03/10/2000   HiB (PRP-OMP) 11/04/1999, 12/31/1999, 03/10/2000, 11/24/2000   IPV 11/04/1999, 12/31/1999, 08/29/2000, 06/09/2004   Influenza,inj,Quad PF,6+ Mos 07/12/2014   Influenza-Unspecified 10/11/2012   MMR 08/29/2001, 06/09/2004   Meningococcal Polysaccharide 04/10/2012   Pneumococcal Conjugate-13 11/24/2000   Pneumococcal-Unspecified 03/10/2000, 05/24/2000   Tdap 04/10/2012   Varicella 08/29/2000, 04/05/2008    Health Maintenance  Topic Date Due   HIV Screening  Never done   Hepatitis C Screening  Never done   INFLUENZA VACCINE  06/01/2022   PAP-Cervical Cytology Screening  06/03/2022 (Originally 08/20/2020)   PAP SMEAR-Modifier  06/03/2022 (Originally 08/20/2020)   TETANUS/TDAP  06/03/2022 (Originally 04/10/2022)   HPV VACCINES  Discontinued   COVID-19 Vaccine  Discontinued    Discussed health benefits of physical activity, and encouraged her to engage in regular exercise appropriate for her age and condition.  Problem List Items Addressed This Visit   None Visit Diagnoses     Annual physical exam    -  Primary   Cervical cancer screening       Need for Tdap vaccination          No follow-ups on file.     Joy Jessup, NP   

## 2023-07-06 NOTE — Progress Notes (Signed)
No show

## 2023-07-07 ENCOUNTER — Ambulatory Visit (INDEPENDENT_AMBULATORY_CARE_PROVIDER_SITE_OTHER): Payer: MEDICAID

## 2023-07-07 ENCOUNTER — Encounter (HOSPITAL_COMMUNITY): Payer: Self-pay

## 2023-07-07 VITALS — BP 130/98 | HR 83 | Ht 63.5 in | Wt 204.8 lb

## 2023-07-07 DIAGNOSIS — F411 Generalized anxiety disorder: Secondary | ICD-10-CM

## 2023-07-07 DIAGNOSIS — G4700 Insomnia, unspecified: Secondary | ICD-10-CM

## 2023-07-07 DIAGNOSIS — F200 Paranoid schizophrenia: Secondary | ICD-10-CM

## 2023-07-07 DIAGNOSIS — Z0389 Encounter for observation for other suspected diseases and conditions ruled out: Secondary | ICD-10-CM | POA: Diagnosis not present

## 2023-07-07 NOTE — Progress Notes (Cosign Needed)
PATIENT ARRIVED FOR haloperidol decanoate (HALDOL DECANOATE) 100 MG/ML injection 100 mg  WHICH IS OVERDUE  100 mg, Intramuscular, Every 30 days   TOLERATED INJECTION WELL IN LEFT UPPER QUAD  & PLEASANT AS ALWAYS

## 2023-07-11 ENCOUNTER — Other Ambulatory Visit (HOSPITAL_COMMUNITY): Payer: MEDICAID

## 2023-07-12 ENCOUNTER — Encounter: Payer: Self-pay | Admitting: Family Medicine

## 2023-07-12 ENCOUNTER — Ambulatory Visit (INDEPENDENT_AMBULATORY_CARE_PROVIDER_SITE_OTHER): Payer: MEDICAID | Admitting: Family Medicine

## 2023-07-12 ENCOUNTER — Other Ambulatory Visit: Payer: Self-pay

## 2023-07-12 VITALS — BP 109/80 | HR 96 | Ht 62.0 in | Wt 209.0 lb

## 2023-07-12 DIAGNOSIS — I10 Essential (primary) hypertension: Secondary | ICD-10-CM

## 2023-07-12 DIAGNOSIS — G2401 Drug induced subacute dyskinesia: Secondary | ICD-10-CM | POA: Insufficient documentation

## 2023-07-12 DIAGNOSIS — Z720 Tobacco use: Secondary | ICD-10-CM | POA: Diagnosis not present

## 2023-07-12 DIAGNOSIS — Z122 Encounter for screening for malignant neoplasm of respiratory organs: Secondary | ICD-10-CM | POA: Diagnosis not present

## 2023-07-12 DIAGNOSIS — R197 Diarrhea, unspecified: Secondary | ICD-10-CM

## 2023-07-12 DIAGNOSIS — G249 Dystonia, unspecified: Secondary | ICD-10-CM | POA: Insufficient documentation

## 2023-07-12 DIAGNOSIS — N179 Acute kidney failure, unspecified: Secondary | ICD-10-CM

## 2023-07-12 HISTORY — DX: Diarrhea, unspecified: R19.7

## 2023-07-12 MED ORDER — AZITHROMYCIN 500 MG PO TABS
500.0000 mg | ORAL_TABLET | Freq: Every day | ORAL | 0 refills | Status: AC
  Filled 2023-07-12: qty 3, 3d supply, fill #0

## 2023-07-12 NOTE — Assessment & Plan Note (Signed)
>>  ASSESSMENT AND PLAN FOR DYSTONIC MOVEMENTS WRITTEN ON 07/12/2023  3:19 PM BY Rashada Klontz, MD  Patient report of facial twisting in the setting of haloperidol injections likely dystonic reaction.  Of note, she is on Ingrezza 40 mg daily, and she is felt like that has helped other movements greatly.  Advised to speak with her psychiatrist, though could consider going up on dose.

## 2023-07-12 NOTE — Patient Instructions (Signed)
I have sent in an antibiotic for your diarrhea called azithromycin. Take this one daily for 3 days. Do not take imodium with this. Please bring back a stool sample so we can test your diarrhea. I will get blood labs today to make sure you are not anemic. Use heating pad, voltaren gel, and rest for your hip. If this gets worse, let me know. I wonder if your facial ticking is due to your medications. Talk with your psychiatrist about this too. Keep doing great with your blood pressure medications.

## 2023-07-12 NOTE — Assessment & Plan Note (Signed)
Continues to smoke.  Not quite ready to quit but is motivated to continue the nicotine patches.  Will proceed with lung cancer screening as below.

## 2023-07-12 NOTE — Assessment & Plan Note (Signed)
History and exam concerning for infection.  Reassured by stable hemodynamics on exam.  Could have contracted bacteria with deli meat preceding the illness.  Given presence of blood in diarrhea, will empirically treat with azithromycin 500 mg daily for 3 days.  Will also have patient bring in stool sample for GI pathogen panel.  Given reports of dizziness, will obtain CBC to assess for anemia and CMP to assess for electrolyte derangements.  Advised to return if symptoms do not improve.  Will follow-up results.

## 2023-07-12 NOTE — Assessment & Plan Note (Signed)
BP well-controlled.  Continue amlodipine 5 at bedtime.

## 2023-07-12 NOTE — Assessment & Plan Note (Signed)
Patient report of facial twisting in the setting of haloperidol injections likely dystonic reaction.  Of note, she is on Ingrezza 40 mg daily, and she is felt like that has helped other movements greatly.  Advised to speak with her psychiatrist, though could consider going up on dose.

## 2023-07-12 NOTE — Progress Notes (Signed)
    SUBJECTIVE:   CHIEF COMPLAINT / HPI:   HTN Taking amlodipine 5 daily.  Facial twisting Just sitting in the park and it happened. Only the left side of her face. Lasted for a minute. Got better on its own. No pain. Has been dizzy with this and had numb hands. Also happened once at home. Has been drinking about 3, eight ounce glasses of water. Urine is typically light yellow. Has occasional balance problems.  Diarrhea and left side pain Started a year ago in her side. Mostly when she bent forward or the sides. Ate some deli [place] meat last week which then precipitated the stomach cramps and diarrhea. Her bowels have not improved. Has about 5 bowel movements that are diarrhea. She did notice a lot of blood in her diarrhea the first time and a smaller amount subsequently. No blood currently. Has been more dizzy since this bloody diarrhea.  PERTINENT  PMH / PSH: sickle cell trait, substance use  OBJECTIVE:   BP 109/80   Pulse 96   Ht 5\' 2"  (1.575 m)   Wt 209 lb (94.8 kg)   SpO2 99%   BMI 38.23 kg/m   General: Alert and oriented, in NAD Skin: Warm, dry, and intact  HEENT: NCAT, EOM grossly normal, midline nasal septum, bright purple lipstick applied Cardiac: RRR, no m/r/g appreciated Respiratory: CTAB, breathing and speaking comfortably on RA Abdominal: Soft, nontender, nondistended, normoactive bowel sounds Extremities/Back: Moves all extremities grossly equally, point tenderness to palpation over bony prominence of the lower left lateral back/hip, no tenderness over left greater trochanter Neurological: Cranial nerves II through XII intact, strength 5 out of 5 throughout, sensation intact throughout, gait normal Psychiatric: Appropriate mood and affect, PHQ-9 = 10 with negative 9  ASSESSMENT/PLAN:   Hypertension BP well-controlled.  Continue amlodipine 5 at bedtime.  Tobacco abuse Continues to smoke.  Not quite ready to quit but is motivated to continue the nicotine  patches.  Will proceed with lung cancer screening as below.  Dystonic movements Patient report of facial twisting in the setting of haloperidol injections likely dystonic reaction.  Of note, she is on Ingrezza 40 mg daily, and she is felt like that has helped other movements greatly.  Advised to speak with her psychiatrist, though could consider going up on dose.   Lumbar strain History of increased activity and exam with point tenderness most likely lumbar strain.  She does have a history of low back pain, so this could be acute on chronic back pain. Given recent blood and diarrhea, do not recommend oral NSAIDs.  Recommended Voltaren gel, heating pad, and rest.  Return if not improving conservatively.  Health maintenance LDCT for lung cancer screening ordered given report of around 3 cigars/days for 37 years (equivalent to 22 cigar pack years).   Janeal Holmes, MD [facility]

## 2023-07-12 NOTE — Addendum Note (Signed)
Addended by: Evette Georges B on: 07/12/2023 03:49 PM   Modules accepted: Orders

## 2023-07-13 ENCOUNTER — Other Ambulatory Visit: Payer: Self-pay | Admitting: Psychiatry

## 2023-07-13 LAB — CBC WITH DIFFERENTIAL/PLATELET
Basophils Absolute: 0 x10E3/uL (ref 0.0–0.2)
Basos: 1 %
EOS (ABSOLUTE): 0.2 x10E3/uL (ref 0.0–0.4)
Eos: 3 %
Hematocrit: 43.2 % (ref 34.0–46.6)
Hemoglobin: 13.9 g/dL (ref 11.1–15.9)
Immature Grans (Abs): 0 x10E3/uL (ref 0.0–0.1)
Immature Granulocytes: 0 %
Lymphocytes Absolute: 2.9 x10E3/uL (ref 0.7–3.1)
Lymphs: 50 %
MCH: 28.7 pg (ref 26.6–33.0)
MCHC: 32.2 g/dL (ref 31.5–35.7)
MCV: 89 fL (ref 79–97)
Monocytes Absolute: 0.4 x10E3/uL (ref 0.1–0.9)
Monocytes: 7 %
Neutrophils Absolute: 2.2 x10E3/uL (ref 1.4–7.0)
Neutrophils: 39 %
Platelets: 301 x10E3/uL (ref 150–450)
RBC: 4.84 x10E6/uL (ref 3.77–5.28)
RDW: 14.9 % (ref 11.7–15.4)
WBC: 5.7 x10E3/uL (ref 3.4–10.8)

## 2023-07-13 LAB — BASIC METABOLIC PANEL
BUN/Creatinine Ratio: 8 — ABNORMAL LOW (ref 9–23)
BUN: 10 mg/dL (ref 6–24)
CO2: 20 mmol/L (ref 20–29)
Calcium: 9.5 mg/dL (ref 8.7–10.2)
Chloride: 104 mmol/L (ref 96–106)
Creatinine, Ser: 1.21 mg/dL — ABNORMAL HIGH (ref 0.57–1.00)
Glucose: 73 mg/dL (ref 70–99)
Potassium: 4.1 mmol/L (ref 3.5–5.2)
Sodium: 143 mmol/L (ref 134–144)
eGFR: 54 mL/min/{1.73_m2} — ABNORMAL LOW (ref >59)

## 2023-07-13 NOTE — Progress Notes (Addendum)
ADDENDUM to 9/10 note: Sent in azithromycin 500 mg daily for 3 days to treat empirically for initially bloody diarrhea. Reassured by cessation of bleeding at office visit. Instructed patient to also bring in stool sample for GI pathogen panel for further testing. Follow up results/improvement.  Called to also discuss elevated creatinine that technically meets AKI criteria. This is likely due to her diarrhea. Her daughter answered the phone and stated she was in class and not at home. She will let patient know to call the clinic back to discuss results.  Please relay to patient that she should make sure she is staying hydrated and to target a urine color of light yellow. If her diarrhea is increasing in volume/frequency, gets bloody again, or she is unable to take in fluids, I would like for her to return to care. Otherwise, if she is stable, I would like for her to come in 1 week for a lab appointment to recheck her creatinine after good hydration. I have placed a future order. Please schedule this for her when she calls back.

## 2023-07-13 NOTE — Addendum Note (Signed)
Addended by: Evette Georges B on: 07/13/2023 12:29 PM   Modules accepted: Orders

## 2023-07-14 LAB — CBC WITH DIFFERENTIAL/PLATELET
Basophils Absolute: 0 10*3/uL (ref 0.0–0.2)
Basos: 1 %
EOS (ABSOLUTE): 0.1 10*3/uL (ref 0.0–0.4)
Eos: 2 %
Hematocrit: 46.9 % — ABNORMAL HIGH (ref 34.0–46.6)
Hemoglobin: 14.7 g/dL (ref 11.1–15.9)
Immature Grans (Abs): 0 10*3/uL (ref 0.0–0.1)
Immature Granulocytes: 0 %
Lymphocytes Absolute: 2.6 10*3/uL (ref 0.7–3.1)
Lymphs: 45 %
MCH: 28.5 pg (ref 26.6–33.0)
MCHC: 31.3 g/dL — ABNORMAL LOW (ref 31.5–35.7)
MCV: 91 fL (ref 79–97)
Monocytes Absolute: 0.4 10*3/uL (ref 0.1–0.9)
Monocytes: 7 %
Neutrophils Absolute: 2.5 10*3/uL (ref 1.4–7.0)
Neutrophils: 45 %
Platelets: 308 10*3/uL (ref 150–450)
RBC: 5.16 x10E6/uL (ref 3.77–5.28)
RDW: 14 % (ref 11.7–15.4)
WBC: 5.7 10*3/uL (ref 3.4–10.8)

## 2023-07-14 LAB — VITAMIN D 25 HYDROXY (VIT D DEFICIENCY, FRACTURES): Vit D, 25-Hydroxy: 15.2 ng/mL — ABNORMAL LOW (ref 30.0–100.0)

## 2023-07-14 LAB — COMPREHENSIVE METABOLIC PANEL
ALT: 10 IU/L (ref 0–32)
AST: 13 IU/L (ref 0–40)
Albumin: 4.8 g/dL (ref 3.8–4.9)
Alkaline Phosphatase: 109 IU/L (ref 44–121)
BUN/Creatinine Ratio: 14 (ref 9–23)
BUN: 13 mg/dL (ref 6–24)
Bilirubin Total: 0.3 mg/dL (ref 0.0–1.2)
CO2: 19 mmol/L — ABNORMAL LOW (ref 20–29)
Calcium: 9.8 mg/dL (ref 8.7–10.2)
Chloride: 102 mmol/L (ref 96–106)
Creatinine, Ser: 0.93 mg/dL (ref 0.57–1.00)
Globulin, Total: 2.4 g/dL (ref 1.5–4.5)
Glucose: 95 mg/dL (ref 70–99)
Potassium: 4 mmol/L (ref 3.5–5.2)
Sodium: 141 mmol/L (ref 134–144)
Total Protein: 7.2 g/dL (ref 6.0–8.5)
eGFR: 74 mL/min/{1.73_m2} (ref >59)

## 2023-07-14 LAB — LIPID PANEL
Chol/HDL Ratio: 3.3 ratio (ref 0.0–4.4)
Cholesterol, Total: 221 mg/dL — ABNORMAL HIGH (ref 100–199)
HDL: 68 mg/dL (ref >39)
LDL Chol Calc (NIH): 125 mg/dL — ABNORMAL HIGH (ref 0–99)
Triglycerides: 162 mg/dL — ABNORMAL HIGH (ref 0–149)
VLDL Cholesterol Cal: 28 mg/dL (ref 5–40)

## 2023-07-14 LAB — HEMOGLOBIN A1C
Est. average glucose Bld gHb Est-mCnc: 126 mg/dL
Hgb A1c MFr Bld: 6 % — ABNORMAL HIGH (ref 4.8–5.6)

## 2023-07-14 LAB — THYROID PANEL WITH TSH
Free Thyroxine Index: 2.5 (ref 1.2–4.9)
T3 Uptake Ratio: 28 % (ref 24–39)
T4, Total: 9 ug/dL (ref 4.5–12.0)
TSH: 4.21 u[IU]/mL (ref 0.450–4.500)

## 2023-07-14 LAB — HEPATIC FUNCTION PANEL: Bilirubin, Direct: <0.1 mg/dL (ref 0.00–0.40)

## 2023-07-14 LAB — PROLACTIN: Prolactin: 11.8 ng/mL (ref 3.6–25.2)

## 2023-07-14 LAB — VITAMIN B12: Vitamin B-12: 418 pg/mL (ref 232–1245)

## 2023-07-14 LAB — VALPROIC ACID LEVEL: Valproic Acid Lvl: <4 ug/mL — ABNORMAL LOW (ref 50–100)

## 2023-07-14 LAB — SPECIMEN STATUS REPORT

## 2023-07-17 LAB — GI PROFILE, STOOL, PCR

## 2023-07-18 ENCOUNTER — Encounter: Payer: Self-pay | Admitting: Family Medicine

## 2023-07-19 ENCOUNTER — Other Ambulatory Visit: Payer: Self-pay

## 2023-07-19 NOTE — Progress Notes (Signed)
Provider called the patient three times without success. Provider left patient a voicemail informing her of her abnormal lab values. Provider recommended diet, exercise, fruits,  vegetables, and lean meat. Provider also recommended patient follow up with her PCP.

## 2023-07-25 ENCOUNTER — Other Ambulatory Visit (HOSPITAL_COMMUNITY): Payer: MEDICAID

## 2023-07-27 ENCOUNTER — Telehealth: Payer: Self-pay

## 2023-07-27 NOTE — Telephone Encounter (Signed)
Patient LVM on nurse line requesting to speak with provider regarding results from stool sample.   Returned call to patient, she did not answer, LVM asking that she return call to office.   Veronda Prude, RN

## 2023-07-28 ENCOUNTER — Inpatient Hospital Stay: Admission: RE | Admit: 2023-07-28 | Payer: MEDICAID | Source: Ambulatory Visit

## 2023-08-04 ENCOUNTER — Ambulatory Visit (HOSPITAL_COMMUNITY): Payer: MEDICAID

## 2023-08-05 ENCOUNTER — Encounter (HOSPITAL_COMMUNITY): Payer: Self-pay

## 2023-08-05 ENCOUNTER — Telehealth (HOSPITAL_COMMUNITY): Payer: Self-pay

## 2023-08-05 NOTE — Telephone Encounter (Signed)
Medication management - Prior authorization for patient's prescribed Ingrezza 40 mg capsules submitted online with CoverMyMeds and sent to patient's [employer] plan for review and decision pending.

## 2023-08-05 NOTE — Telephone Encounter (Signed)
Opened in error

## 2023-08-08 ENCOUNTER — Encounter (HOSPITAL_COMMUNITY): Payer: Self-pay | Admitting: Psychiatry

## 2023-08-08 DIAGNOSIS — G2401 Drug induced subacute dyskinesia: Secondary | ICD-10-CM | POA: Insufficient documentation

## 2023-08-08 NOTE — Telephone Encounter (Signed)
Patients diagnosis has been updated in her chart.

## 2023-08-09 ENCOUNTER — Ambulatory Visit (HOSPITAL_COMMUNITY): Payer: MEDICAID | Admitting: Clinical

## 2023-08-12 DIAGNOSIS — Z0389 Encounter for observation for other suspected diseases and conditions ruled out: Secondary | ICD-10-CM | POA: Diagnosis not present

## 2023-08-16 ENCOUNTER — Ambulatory Visit (HOSPITAL_COMMUNITY): Payer: MEDICAID

## 2023-08-17 ENCOUNTER — Telehealth (HOSPITAL_COMMUNITY): Payer: Self-pay | Admitting: *Deleted

## 2023-08-17 NOTE — Telephone Encounter (Signed)
Looked online with cover my meds. Anita Hill has been denied.

## 2023-08-17 NOTE — Telephone Encounter (Signed)
Thank you for this update.  Please notify the patient.

## 2023-08-18 ENCOUNTER — Ambulatory Visit (INDEPENDENT_AMBULATORY_CARE_PROVIDER_SITE_OTHER): Payer: MEDICAID

## 2023-08-18 ENCOUNTER — Encounter (HOSPITAL_COMMUNITY): Payer: Self-pay

## 2023-08-18 ENCOUNTER — Encounter (HOSPITAL_COMMUNITY): Payer: Self-pay | Admitting: Student

## 2023-08-18 ENCOUNTER — Ambulatory Visit (HOSPITAL_COMMUNITY): Payer: MEDICAID | Admitting: Student

## 2023-08-18 VITALS — BP 137/95 | HR 99 | Resp 16 | Ht 62.0 in | Wt 209.0 lb

## 2023-08-18 DIAGNOSIS — F411 Generalized anxiety disorder: Secondary | ICD-10-CM

## 2023-08-18 DIAGNOSIS — F209 Schizophrenia, unspecified: Secondary | ICD-10-CM | POA: Diagnosis not present

## 2023-08-18 DIAGNOSIS — F200 Paranoid schizophrenia: Secondary | ICD-10-CM | POA: Diagnosis not present

## 2023-08-18 DIAGNOSIS — F5105 Insomnia due to other mental disorder: Secondary | ICD-10-CM | POA: Diagnosis not present

## 2023-08-18 DIAGNOSIS — F17219 Nicotine dependence, cigarettes, with unspecified nicotine-induced disorders: Secondary | ICD-10-CM | POA: Diagnosis not present

## 2023-08-18 MED ORDER — DIVALPROEX SODIUM ER 500 MG PO TB24
1000.0000 mg | ORAL_TABLET | Freq: Every day | ORAL | 3 refills | Status: DC
Start: 2023-08-18 — End: 2023-10-18

## 2023-08-18 MED ORDER — HYDROXYZINE PAMOATE 25 MG PO CAPS
25.0000 mg | ORAL_CAPSULE | Freq: Three times a day (TID) | ORAL | 3 refills | Status: DC | PRN
Start: 2023-08-18 — End: 2023-10-18

## 2023-08-18 MED ORDER — NICOTINE 21 MG/24HR TD PT24
21.0000 mg | MEDICATED_PATCH | Freq: Every morning | TRANSDERMAL | 3 refills | Status: DC
Start: 2023-08-18 — End: 2023-10-18

## 2023-08-18 MED ORDER — DOXEPIN HCL 150 MG PO CAPS
150.0000 mg | ORAL_CAPSULE | Freq: Every day | ORAL | 3 refills | Status: DC
Start: 2023-08-18 — End: 2023-10-18

## 2023-08-18 NOTE — Progress Notes (Signed)
Patient presented with appropriate affect, pleasant mood and denied any current auditory or visual hallucinations, and no suicidal or homicidal ideations, plan, intent or means to want to harm himself or others.  Patient seen by Dr.Ji and no changes to injection schedule today.  Patient's Haldol Decanoate 100 mg/ml IM injection every 28 days prepared as ordered and given to patient in her left upper outer gluteal area.  Patient tolerated due injection without complaint of pain or discomfort and agreed to return in 4 weeks for next injection.  Patient stated some congestion today but feeling better and agreed to call if any issues or problems with symptoms prior to next visit in 4 weeks.

## 2023-08-18 NOTE — Progress Notes (Addendum)
BH MD Outpatient Progress Note  08/18/2023 3:04 PM QUIN MCPHERSON  MRN:  [id number]  Assessment:  Anita Hill presents for follow-up evaluation in-person. Today, 08/18/23, patient reports relative stability with regards to mood and anxiety since last visit. Current medication regiment is depakote 1000 mg at bedtime, haldol dec 100 mg daily, doxepin 150 mg nightly, and hydroxyzine 25 mg tid.  I discussed concern that patient is on depakote and it's risk for teratogenicity but she reported not having any menstrual cycles for over a decade. We will continue to explore patient's diagnosis as it is unlikely that patient has both bipolar disorder and schizophrenia but could possibly of schizoaffective disorder.  Plan to continue current medication regimen and she will follow-up with me in approximately 2 months for medication management.  Identifying Information: Anita Hill is a 52 y.o.female w/ hx of schizophrenia, nicotine dependence, bipolar disorder, alcohol use disorder in sustained remission who is an established patient with [facility] for medication management. Daughter Joanna Hews is guardian.   Plan:  # Schizophrenia spectrum disorder Past medication trials:  Status of problem: stable Interventions: -- Continue Haldol decanoate 100 mg every 30 days --Continue hydroxyzine 25 mg 3 times daily as needed --continue doxepin 150 mg nightly --Continue Depakote 1000 mg daily  -last level was inaccurate due to missing prescription for a few days  -repeat VPA level next visit  # Nicotine dependence Past medication trials:  Interventions: -- Continue NicoDerm patch  Return to care in 2 months  Patient was given contact information for behavioral health clinic and was instructed to call 911 for emergencies.    Patient and plan of care will be discussed with the Attending MD, Dr. Josephina Shih, who agrees with the above statement and plan.    Subjective:  Chief Complaint: Medication Management   Interval History:  Patient seen for follow-up at [facility] clinic.  She denies SI/HI/AVH.  She reports appetite has been improving.  She reports losing appetite for 1 to 2 weeks for no reason but her appetite has returned.  Denies any acute somatic complaints from current medication regiment.  She reports sleep has been good. She reports mood has been fair.  She is amenable to continuing medication regimen and reports compliance with it.  Visit Diagnosis:    ICD-10-CM   1. Anxiety state  F41.1 hydrOXYzine (VISTARIL) 25 MG capsule    2. 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 Cigarette nicotine dependence with nicotine-induced disorder  F17.219 nicotine (NICODERM CQ - DOSED IN MG/24 HOURS) 21 mg/24hr patch      Past Psychiatric History:  Diagnoses: Schizophrenia, alcohol use disorder, bipolar disorder Medication trials: Lurasidone, risperidone, haloperidol, Zyprexa Previous psychiatrist/therapist: Gretchen Short Hospitalizations: several, last 1 noted to the July 2022 Suicide attempts: at least once via bleach ingestion Hx of violence towards others: endorses when psychotic Current access to guns: denies Substance use: alcohol, in sustained remission  Past Medical History:  Past Medical History:  Diagnosis Date   BIPOLAR DISORDER    Chest pain 01/20/2016   Delusions (HCC)    Depression    Dyspnea 10/13/2012   Healthcare maintenance 11/11/2021   Heart murmur    "MILD"   Homelessness    Left ventricular hypertrophy    Palpitations  Psychosis (HCC)    Sickle cell trait (HCC)    Substance abuse (HCC)    Weight gain 11/11/2021    Past Surgical History:  Procedure Laterality Date   BUNIONECTOMY     SKIN BIOPSY        Family History:   Family History  Problem Relation Age of Onset   Heart disease Mother        CHF   Prostate cancer Father    Colon polyps Neg Hx    Colon cancer Neg Hx    Crohn's disease Neg Hx    Esophageal cancer Neg Hx    Rectal cancer Neg Hx    Stomach cancer Neg Hx    Ulcerative colitis Neg Hx     Social History:  Academic/Vocational: disability Social History   Socioeconomic History   Marital status: Single    Spouse name: Not on file   Number of children: 2   Years of education: Not on file   Highest education level: Not on file  Occupational History    Comment: Unemployed  Tobacco Use   Smoking status: Every Day    Current packs/day: 1.00    Average packs/day: 1 pack/day for 35.8 years (35.8 ttl pk-yrs)    Types: Cigarettes, Cigars    Start date: 11/02/1987    Passive exposure: Never   Smokeless tobacco: Never   Tobacco comments:    Still smokes one pack of cigars per day.  States still trying to cut back.   Vaping Use   Vaping status: Never Used  Substance and Sexual Activity   Alcohol use: Yes    Comment: everyday 20-80oz   Drug use: Not Currently    Types: Cocaine    Comment: Previous cocaine, heroin, marijuana,CLEAN 21 YEARS UPDATED 09/06/22   Sexual activity: Not Currently  Other Topics Concern   Not on file  Social History Narrative   Not on file   Social Determinants of Health   Financial Resource Strain: Low Risk  (07/15/2021)   Overall Financial Resource Strain (CARDIA)    Difficulty of Paying Living Expenses: Not hard at all  Food Insecurity: No Food Insecurity (07/15/2021)   Hunger Vital Sign    Worried About Running Out of Food in the Last Year: Never true    Ran Out of Food in the Last Year: Never true  Transportation Needs: No Transportation Needs (07/15/2021)   PRAPARE - Administrator, Civil Service (Medical): No    Lack of Transportation (Non-Medical): No  Physical Activity: Not on file  Stress: Not on file  Social Connections: Not on  file    Allergies:  Allergies  Allergen Reactions   Strawberry Extract Anaphylaxis   Risperidone And Related Hives, Nausea Only and Other (See Comments)    Made me feel jittery and restless/ Pt denies this allergy on 01/27/21   Latuda [Lurasidone Hcl] Other (See Comments)    Hot and cold flashes    Current Medications: Current Outpatient Medications  Medication Sig Dispense Refill   amLODipine (NORVASC) 5 MG tablet TAKE 1 TABLET(5 MG) BY MOUTH AT BEDTIME 90 tablet 0   divalproex (DEPAKOTE ER) 500 MG 24 hr tablet Take 2 tablets (1,000 mg total) by mouth daily at 10 pm. 60 tablet 3   doxepin (SINEQUAN) 150 MG capsule Take 1 capsule (150 mg total) by mouth at bedtime. 30 capsule 3   haloperidol decanoate (HALDOL DECANOATE) 100 MG/ML injection Inject 1 mL (100 mg total) into the muscle  every 30 (thirty) days. 1 mL 3   hydrOXYzine (VISTARIL) 25 MG capsule Take 1 capsule (25 mg total) by mouth 3 (three) times daily as needed. 60 capsule 3   nicotine (NICODERM CQ - DOSED IN MG/24 HOURS) 21 mg/24hr patch Place 1 patch (21 mg total) onto the skin every morning. 30 patch 3   Current Facility-Administered Medications  Medication Dose Route Frequency Provider Last Rate Last Admin   haloperidol decanoate (HALDOL DECANOATE) 100 MG/ML injection 100 mg  100 mg Intramuscular Q30 days Toy Cookey E, NP   100 mg at 08/18/23 1425    ROS: Review of Systems   Objective:  Psychiatric Specialty Exam: There were no vitals taken for this visit.There is no height or weight on file to calculate BMI.  General Appearance: Casual  Eye Contact: Good  Speech:  Clear and Coherent and Normal Rate  Volume:  Normal  Mood:  Euthymic  Affect:  Appropriate and Congruent  Thought Content: Logical   Suicidal Thoughts:  No  Homicidal Thoughts:  No  Thought Process:  Coherent, Goal Directed, and Linear  Orientation:  Full (Time, Place, and Person)    Memory: Remote;   Fair  Judgment:  Intact  Insight:   Fair  Concentration:  Concentration: Fair  Recall: not formally assessed   Fund of Knowledge: Fair  Language: Fair  Psychomotor Activity:  Normal  Akathisia:  No  AIMS (if indicated): not done  Assets:  Communication Skills Desire for Improvement Financial Resources/Insurance Housing Leisure Time Physical Health Resilience Social Support Talents/Skills Transportation  ADL's:  Intact  Cognition: WNL  Sleep:  Fair   PE: General: well-appearing; no acute distress  Pulm: no increased work of breathing on room air  Strength & Muscle Tone: within normal limits Neuro: no focal neurological deficits observed  Gait & Station: normal  Metabolic Disorder Labs: Lab Results  Component Value Date   HGBA1C 6.0 (H) 07/13/2023   MPG 125.5 05/08/2021   Lab Results  Component Value Date   PROLACTIN 11.8 07/13/2023   PROLACTIN 2.6 (L) 05/08/2021   Lab Results  Component Value Date   CHOL 221 (H) 07/13/2023   TRIG 162 (H) 07/13/2023   HDL 68 07/13/2023   CHOLHDL 3.3 07/13/2023   VLDL 13 05/08/2021   LDLCALC 125 (H) 07/13/2023   LDLCALC 104 (H) 03/22/2022   Lab Results  Component Value Date   TSH 4.210 07/13/2023   TSH 3.290 03/22/2022    Therapeutic Level Labs: No results found for: "LITHIUM" Lab Results  Component Value Date   VALPROATE <4 (L) 07/13/2023   VALPROATE 78 05/31/2022   Lab Results  Component Value Date   CBMZ <2.0 (L) 01/08/2008    Screenings: AIMS    Flowsheet Row Office Visit from 02/08/2023 in [facility] Office Visit from 09/02/2022 in [facility] Office Visit from 04/08/2022 in [facility] Office Visit from 02/09/2022 in [facility]  AIMS Total Score 5 3 0 0 0      AUDIT    Flowsheet Row Office Visit from 05/12/2023 in [facility] Admission (Discharged) from [facility]  Alcohol Use Disorder Identification Test Final Score (AUDIT) 10 5      GAD-7    Flowsheet Row Office Visit from 05/12/2023 in [facility] Office Visit from  02/08/2023 in [facility] Office Visit from 12/02/2022 in [facility] Office Visit from 09/02/2022 in [facility] Office Visit from 04/08/2022 in [facility]  Total GAD-7 Score 6 4 9 3  0      PHQ2-9    Flowsheet Row Office Visit from 07/12/2023 in [facility] Office Visit from 05/12/2023 in [facility] Office Visit from 02/08/2023 in [facility] Office Visit from 12/30/2022 in [facility] Office Visit from 12/02/2022 in [facility]  PHQ-2 Total Score 1 1 1 2 2   PHQ-9 Total Score 10 12 9 11 12       Flowsheet Row Office Visit from 04/08/2022 in [facility] Counselor from 11/17/2021 in [facility] ED from 07/01/2021 in [facility] Emergency Department at [facility]  C-SSRS RISK CATEGORY No Risk No Risk No Risk       Collaboration of Care: Collaboration of Care:  Patient/Guardian was advised Release of Information must be obtained prior to any record release in order to collaborate their care with an outside provider. Patient/Guardian was advised if they have not already done so to contact the registration department to sign all necessary forms in order for [place] to release information regarding their care.   Consent: Patient/Guardian gives verbal consent for treatment and assignment of benefits for services provided during this visit. Patient/Guardian expressed understanding and agreed to proceed.   Park Pope,  MD 08/18/2023, 3:04 PM

## 2023-08-22 ENCOUNTER — Other Ambulatory Visit: Payer: Self-pay | Admitting: Family Medicine

## 2023-08-30 ENCOUNTER — Ambulatory Visit: Payer: MEDICAID | Admitting: Family Medicine

## 2023-09-08 DIAGNOSIS — Z0389 Encounter for observation for other suspected diseases and conditions ruled out: Secondary | ICD-10-CM | POA: Diagnosis not present

## 2023-09-12 ENCOUNTER — Ambulatory Visit (HOSPITAL_COMMUNITY): Payer: MEDICAID | Admitting: Clinical

## 2023-09-14 ENCOUNTER — Ambulatory Visit (HOSPITAL_COMMUNITY): Payer: MEDICAID

## 2023-09-20 ENCOUNTER — Encounter: Payer: Self-pay | Admitting: Family Medicine

## 2023-09-20 ENCOUNTER — Ambulatory Visit: Payer: MEDICAID | Admitting: Family Medicine

## 2023-09-20 VITALS — BP 122/80 | HR 100 | Wt 213.0 lb

## 2023-09-20 DIAGNOSIS — I10 Essential (primary) hypertension: Secondary | ICD-10-CM

## 2023-09-20 DIAGNOSIS — G629 Polyneuropathy, unspecified: Secondary | ICD-10-CM | POA: Insufficient documentation

## 2023-09-20 DIAGNOSIS — H539 Unspecified visual disturbance: Secondary | ICD-10-CM

## 2023-09-20 DIAGNOSIS — G6289 Other specified polyneuropathies: Secondary | ICD-10-CM

## 2023-09-20 DIAGNOSIS — Z23 Encounter for immunization: Secondary | ICD-10-CM

## 2023-09-20 DIAGNOSIS — F29 Unspecified psychosis not due to a substance or known physiological condition: Secondary | ICD-10-CM | POA: Diagnosis not present

## 2023-09-20 DIAGNOSIS — Z780 Asymptomatic menopausal state: Secondary | ICD-10-CM

## 2023-09-20 DIAGNOSIS — Z1159 Encounter for screening for other viral diseases: Secondary | ICD-10-CM

## 2023-09-20 NOTE — Assessment & Plan Note (Signed)
Stable from psychiatric standpoint.  However, has concerns about weight gain and elevated A1c.  Likely in setting of antipsychotics.  Advised to chat with her psychiatrist more about this regimen.  At her psychiatrist request, we will also repeat VPA levels today.

## 2023-09-20 NOTE — Assessment & Plan Note (Addendum)
History and exam suggest peripheral neuropathy with glove and stocking distribution.  Her A1c is mildly elevated, though it does not appear she has uncontrolled diabetes.  Her previously checked vitamin B12 was on the lower end of normal.  Will check methylmalonic acid as well as folate today to rule out other nutritional causes of neuropathy given her psychiatric medications could be contributing.  If unrevealing, could consider nerve conduction studies with neurology.

## 2023-09-20 NOTE — Assessment & Plan Note (Signed)
Controlled.  Continue amlodipine °

## 2023-09-20 NOTE — Progress Notes (Addendum)
    SUBJECTIVE:   CHIEF COMPLAINT / HPI:   Vision concerns Feels her vision is worsening.  Would like to get this rechecked out.  Previously went to another optometrist who is not in network for her.  Numbness and tingling in hands and feet Has been going on for years now, mostly at night. Sensation of tingling and at times numbness. Goes away in the mornings as she gets up and moves.  Only symptoms are in her hands and feet across all toes and all fingers.  Difficult to truly assess, though feels symptoms are on both sides of hands and dorsal and plantar surface of feet.  Weight gain Feels she has gained a lot of weight while being on her psychiatric medications.  Once her other shingles we can do from this perspective.  Amenorrhea Psychiatrist reached out to me about working up amenorrhea.  Patient seems to have told them she had been amenorrheic for 10 years.  She denies any irregular bleeding to me today, she states she is only been amenorrheic for 4 years.  No abdominal pain.  She has also had hot flashes over this period.  OBJECTIVE:   BP 122/80   Pulse 100   Wt 213 lb (96.6 kg)   SpO2 98%   BMI 38.96 kg/m   General: Alert and oriented, in NAD Skin: Warm, dry, and intact without lesions HEENT: NCAT, EOM grossly normal, midline nasal septum Cardiac: RRR, no m/r/g appreciated Respiratory: CTAB, breathing and speaking comfortably on RA Abdominal: Soft, nontender, nondistended, normoactive bowel sounds Extremities: Moves all extremities grossly equally Neurological: No gross focal deficit Psychiatric: Appropriate mood and affect, PHQ-9 = 7 with negative #9  ASSESSMENT/PLAN:   Hypertension Controlled.  Continue amlodipine.  Schizophrenia spectrum disorder with psychotic disorder type not yet determined (HCC) Stable from psychiatric standpoint.  However, has concerns about weight gain and elevated A1c.  Likely in setting of antipsychotics.  Advised to chat with her  psychiatrist more about this regimen.  At her psychiatrist request, we will also repeat VPA levels today.  Peripheral neuropathy History and exam suggest peripheral neuropathy with glove and stocking distribution.  Her A1c is mildly elevated, though it does not appear she has uncontrolled diabetes.  Her previously checked vitamin B12 was on the lower end of normal.  Will check methylmalonic acid as well as folate today to rule out other nutritional causes of neuropathy given her psychiatric medications could be contributing.  If unrevealing, could consider nerve conduction studies with neurology.  Postmenopausal History of amenorrhea now for 4 years with initial hot flashes and without irregular bleeding/abdominal pain and patient's age is likely due to menopause.  Recent prolactin level normal as checked by psychiatrist.  Follow-up as needed.  Vision concerns Will send referral to optometry to help find someone in network.  Health maintenance Collected hepatitis C screening today.  She received her COVID vaccination.  Will message team about getting her lung cancer screening scheduled.  Janeal Holmes, MD [facility]

## 2023-09-20 NOTE — Assessment & Plan Note (Signed)
History of amenorrhea now for 4 years with initial hot flashes and without irregular bleeding/abdominal pain and patient's age is likely due to menopause.  Recent prolactin level normal as checked by psychiatrist.  Follow-up as needed.

## 2023-09-20 NOTE — Patient Instructions (Addendum)
We collected labs today. I have sent an optometrist referral. You likely have some peripheral neuropathy. Let's see how this does after we get more labs today. You are likely postmenopausal. I will get you scheduled for lung cancer screening soon.

## 2023-09-21 ENCOUNTER — Encounter: Payer: Self-pay | Admitting: Family Medicine

## 2023-09-21 LAB — HCV INTERPRETATION

## 2023-09-21 LAB — BASIC METABOLIC PANEL
BUN/Creatinine Ratio: 13 (ref 9–23)
BUN: 11 mg/dL (ref 6–24)
CO2: 23 mmol/L (ref 20–29)
Calcium: 9.2 mg/dL (ref 8.7–10.2)
Chloride: 105 mmol/L (ref 96–106)
Creatinine, Ser: 0.88 mg/dL (ref 0.57–1.00)
Glucose: 74 mg/dL (ref 70–99)
Potassium: 4.3 mmol/L (ref 3.5–5.2)
Sodium: 144 mmol/L (ref 134–144)
eGFR: 79 mL/min/{1.73_m2} (ref >59)

## 2023-09-21 LAB — HCV AB W REFLEX TO QUANT PCR: HCV Ab: NONREACTIVE

## 2023-09-21 LAB — VALPROIC ACID LEVEL: Valproic Acid Lvl: 83 ug/mL (ref 50–100)

## 2023-09-22 ENCOUNTER — Telehealth: Payer: Self-pay

## 2023-09-22 NOTE — Telephone Encounter (Signed)
Called patient and LVM informing her of number to [facility] so that  she could reschedule CT appointment if she wanted.  Patient had an appointment 07/28/2023 in which she was a NO SHOW.  Glennie Hawk, CMA

## 2023-09-24 LAB — METHYLMALONIC ACID, SERUM: Methylmalonic Acid: 65 nmol/L (ref 0–378)

## 2023-09-24 LAB — SPECIMEN STATUS REPORT

## 2023-10-10 NOTE — Progress Notes (Unsigned)
BH MD Outpatient Progress Note  10/10/2023 4:25 PM Anita Hill  MRN:  [id number]  Assessment:  Anita Hill presents for follow-up evaluation in-person. Today, 10/10/23, patient reports relative stability with regards to mood and anxiety since last visit. Current medication regiment is depakote 1000 mg at bedtime, haldol dec 100 mg daily, doxepin 150 mg nightly, and hydroxyzine 25 mg tid.  I discussed concern that patient is on depakote and it's risk for teratogenicity but she reported not having any menstrual cycles for over a decade. We will continue to explore patient's diagnosis as it is unlikely that patient has both bipolar disorder and schizophrenia but could possibly of schizoaffective disorder.  Plan to continue current medication regimen and she will follow-up with me in approximately 2 months for medication management.  Identifying Information: Anita Hill is a 52 y.o.female w/ hx of schizophrenia, nicotine dependence, bipolar disorder, alcohol use disorder in sustained remission who is an established patient with [facility] for medication management. Daughter Joanna Hews is guardian.   Plan:  # Schizophrenia spectrum disorder Past medication trials:  Status of problem: stable Interventions: -- Continue Haldol decanoate 100 mg every 30 days --Continue hydroxyzine 25 mg 3 times daily as needed --continue doxepin 150 mg nightly --Continue Depakote 1000 mg daily  -last level was inaccurate due to missing prescription for a few days  -repeat VPA level next visit  # Nicotine dependence Past medication trials:  Interventions: -- Continue NicoDerm patch  Return to care in 2 months  Patient was given contact information for behavioral health clinic and was instructed to call 911 for emergencies.    Patient and plan of care will be discussed with the Attending MD, Dr. Josephina Shih, who agrees with the above statement and plan.    Subjective:  Chief Complaint: Medication Management   Interval History:  Patient seen for follow-up at [facility] clinic.  She denies SI/HI/AVH.  She reports appetite has been improving.  She reports losing appetite for 1 to 2 weeks for no reason but her appetite has returned.  Denies any acute somatic complaints from current medication regiment.  She reports sleep has been good. She reports mood has been fair.  She is amenable to continuing medication regimen and reports compliance with it.  Visit Diagnosis:  No diagnosis found.   Past Psychiatric History:  Diagnoses: Schizophrenia, alcohol use disorder, bipolar disorder Medication trials: Lurasidone, risperidone, haloperidol, Zyprexa Previous psychiatrist/therapist: Gretchen Short Hospitalizations: several, last 1 noted to the July 2022 Suicide attempts: at least once via bleach ingestion Hx of violence towards others: endorses when psychotic Current access to guns: denies Substance use: alcohol, in sustained remission  Past Medical History:  Past Medical History:  Diagnosis Date   BIPOLAR DISORDER    Chest pain 01/20/2016   Delusions (HCC)    Depression    Dyspnea 10/13/2012   Healthcare maintenance 11/11/2021   Heart murmur    "MILD"   Homelessness    Left ventricular hypertrophy    Palpitations    Psychosis (HCC)    Sickle cell trait (HCC)    Substance abuse (HCC)    Weight gain 11/11/2021    Past Surgical History:  Procedure Laterality Date   BUNIONECTOMY     SKIN BIOPSY        Family History:  Family History  Problem Relation Age of Onset   Heart disease Mother        CHF   Prostate cancer Father    Colon  polyps Neg Hx    Colon cancer Neg Hx    Crohn's disease Neg Hx    Esophageal cancer Neg Hx    Rectal cancer Neg Hx    Stomach cancer Neg Hx    Ulcerative colitis Neg Hx     Social History:  Academic/Vocational: disability Social History   Socioeconomic History   Marital status: Single     Spouse name: Not on file   Number of children: 2   Years of education: Not on file   Highest education level: Not on file  Occupational History    Comment: Unemployed  Tobacco Use   Smoking status: Every Day    Current packs/day: 1.00    Average packs/day: 1 pack/day for 35.9 years (35.9 ttl pk-yrs)    Types: Cigarettes, Cigars    Start date: 11/02/1987    Passive exposure: Never   Smokeless tobacco: Never   Tobacco comments:    Still smokes one pack of cigars per day.  States still trying to cut back.   Vaping Use   Vaping status: Never Used  Substance and Sexual Activity   Alcohol use: Yes    Comment: everyday 20-80oz   Drug use: Not Currently    Types: Cocaine    Comment: Previous cocaine, heroin, marijuana,CLEAN 21 YEARS UPDATED 09/06/22   Sexual activity: Not Currently  Other Topics Concern   Not on file  Social History Narrative   Not on file   Social Determinants of Health   Financial Resource Strain: Low Risk  (07/15/2021)   Overall Financial Resource Strain (CARDIA)    Difficulty of Paying Living Expenses: Not hard at all  Food Insecurity: No Food Insecurity (07/15/2021)   Hunger Vital Sign    Worried About Running Out of Food in the Last Year: Never true    Ran Out of Food in the Last Year: Never true  Transportation Needs: No Transportation Needs (07/15/2021)   PRAPARE - Administrator, Civil Service (Medical): No    Lack of Transportation (Non-Medical): No  Physical Activity: Not on file  Stress: Not on file  Social Connections: Not on file    Allergies:  Allergies  Allergen Reactions   Strawberry Extract Anaphylaxis   Risperidone And Related Hives, Nausea Only and Other (See Comments)    Made me feel jittery and restless/ Pt denies this allergy on 01/27/21   Latuda [Lurasidone Hcl] Other (See Comments)    Hot and cold flashes    Current Medications: Current Outpatient Medications  Medication Sig Dispense Refill   amLODipine (NORVASC) 5 MG  tablet TAKE 1 TABLET(5 MG) BY MOUTH AT BEDTIME 90 tablet 0   divalproex (DEPAKOTE ER) 500 MG 24 hr tablet Take 2 tablets (1,000 mg total) by mouth daily at 10 pm. 60 tablet 3   doxepin (SINEQUAN) 150 MG capsule Take 1 capsule (150 mg total) by mouth at bedtime. 30 capsule 3   haloperidol decanoate (HALDOL DECANOATE) 100 MG/ML injection Inject 1 mL (100 mg total) into the muscle every 30 (thirty) days. 1 mL 3   hydrOXYzine (VISTARIL) 25 MG capsule Take 1 capsule (25 mg total) by mouth 3 (three) times daily as needed. 60 capsule 3   nicotine (NICODERM CQ - DOSED IN MG/24 HOURS) 21 mg/24hr patch Place 1 patch (21 mg total) onto the skin every morning. 30 patch 3   No current facility-administered medications for this visit.    ROS: Review of Systems   Objective:  Psychiatric Specialty  Exam: There were no vitals taken for this visit.There is no height or weight on file to calculate BMI.  General Appearance: Casual  Eye Contact: Good  Speech:  Clear and Coherent and Normal Rate  Volume:  Normal  Mood:  Euthymic  Affect:  Appropriate and Congruent  Thought Content: Logical   Suicidal Thoughts:  No  Homicidal Thoughts:  No  Thought Process:  Coherent, Goal Directed, and Linear  Orientation:  Full (Time, Place, and Person)    Memory: Remote;   Fair  Judgment:  Intact  Insight:  Fair  Concentration:  Concentration: Fair  Recall: not formally assessed   Fund of Knowledge: Fair  Language: Fair  Psychomotor Activity:  Normal  Akathisia:  No  AIMS (if indicated): not done  Assets:  Communication Skills Desire for Improvement Financial Resources/Insurance Housing Leisure Time Physical Health Resilience Social Support Talents/Skills Transportation  ADL's:  Intact  Cognition: WNL  Sleep:  Fair   PE: General: well-appearing; no acute distress  Pulm: no increased work of breathing on room air  Strength & Muscle Tone: within normal limits Neuro: no focal neurological deficits  observed  Gait & Station: normal  Metabolic Disorder Labs: Lab Results  Component Value Date   HGBA1C 6.0 (H) 07/13/2023   MPG 125.5 05/08/2021   Lab Results  Component Value Date   PROLACTIN 11.8 07/13/2023   PROLACTIN 2.6 (L) 05/08/2021   Lab Results  Component Value Date   CHOL 221 (H) 07/13/2023   TRIG 162 (H) 07/13/2023   HDL 68 07/13/2023   CHOLHDL 3.3 07/13/2023   VLDL 13 05/08/2021   LDLCALC 125 (H) 07/13/2023   LDLCALC 104 (H) 03/22/2022   Lab Results  Component Value Date   TSH 4.210 07/13/2023   TSH 3.290 03/22/2022    Therapeutic Level Labs: No results found for: "LITHIUM" Lab Results  Component Value Date   VALPROATE 83 09/20/2023   VALPROATE <4 (L) 07/13/2023   Lab Results  Component Value Date   CBMZ <2.0 (L) 01/08/2008    Screenings: AIMS    Flowsheet Row Office Visit from 02/08/2023 in [facility] Office Visit from 09/02/2022 in [facility] Office Visit from 04/08/2022 in [facility] Office Visit from 02/09/2022 in [facility] (Dis[facility]  AIMS Total Score 5 3 0 0 0      AUDIT    Flowsheet Row Office Visit from 05/12/2023 in [facility] Admission (Discharged) from [facility]  Alcohol Use Disorder Identification Test Final Score (AUDIT) 10 5      GAD-7    Flowsheet Row Office Visit from 05/12/2023 in [facility] Office Visit from 02/08/2023 in [facility] Office Visit from 12/02/2022 in [facility] Office Visit from 09/02/2022 in [facility] Office Visit from 04/08/2022 in [facility]  Total GAD-7 Score 6 4 9 3  0      PHQ2-9    Flowsheet Row  Office Visit from 09/20/2023 in [facility] [place]. [facility] Office Visit from 07/12/2023 in [facility] [place]. [facility] Office Visit from 05/12/2023 in [facility] Office Visit from 02/08/2023 in [facility] Office Visit from 12/30/2022 in [facility]. [facility]  PHQ-2 Total Score 0 1 1 1 2   PHQ-9 Total Score 7 10 12 9 11       Flowsheet Row Office Visit from 04/08/2022 in [facility] Counselor from 11/17/2021 in [facility] ED from 07/01/2021 in [facility] Emergency Department at [facility]  C-SSRS RISK CATEGORY No Risk No Risk No Risk       Collaboration of Care: Collaboration of Care:  Patient/Guardian was advised Release of Information must be obtained prior to any record release in order to collaborate their care with an outside provider. Patient/Guardian was advised if they have not already done so to contact the registration department to sign all necessary forms in order for [place] to release information regarding their care.   Consent: Patient/Guardian gives verbal consent for treatment and assignment of benefits for services provided during this visit. Patient/Guardian expressed understanding and agreed to proceed.   Park Pope, MD 10/10/2023, 4:25 PM

## 2023-10-11 ENCOUNTER — Encounter (HOSPITAL_COMMUNITY): Payer: MEDICAID | Admitting: Student

## 2023-10-16 NOTE — Progress Notes (Unsigned)
BH MD Outpatient Progress Note  10/18/2023 3:35 PM Anita Hill  MRN:  [id number]  Assessment:  Anita [facility] presents for follow-up evaluation in-person. Today, 10/18/23, patient reports relative stability with regards to mood and anxiety since last visit. Current medication regiment is depakote 1000 mg at bedtime, haldol dec 100 mg daily, doxepin 150 mg nightly, and hydroxyzine 25 mg tid.    She currently denies any acute concerns regarding mood nor psychosis .She corrected that amenorrrhea has only been since 4 years ago and confirmed to likely be menopause per PCP. Due to concerns regarding weight gain, we started her on metformin and have reached out to PCP regarding possibly going on GLP1 for weight loss.  She was also advised to decrease/cease use of alcohol and cannabis as this is likely contributing to her weight as well. Plan to continue current medication regimen and she will follow-up with me in approximately 2-3 months for medication management.  Identifying Information: Anita Hill is a 52 y.o.female w/ hx of schizophrenia, nicotine dependence, bipolar disorder, alcohol use disorder in sustained remission who is an established patient with [facility] for medication management. Daughter Anita Hill is guardian.   Plan:  # Schizophrenia spectrum disorder Past medication trials:  Status of problem: stable Interventions: -- Continue Haldol decanoate 100 mg every 30 days  -07/13/2023 A1c 6.0, LDL cholesterol 125 --Continue hydroxyzine 25 mg 3 times daily as needed --continue doxepin 150 mg nightly --Continue Depakote 1000 mg daily  -last level was inaccurate due to missing prescription for a few days  -VPA level 09/20/2023 appropriate -start metformin 500 mg daily with breakfast  # Nicotine dependence #Cannabis Use Disorder #Alcohol Use Disorder Past medication trials:  Interventions: -- Continue NicoDerm patch -- start nicotine gum --  advise cessation   Return to care in 2 months  Guardian Anita Hill (daughter) was informed and was amenable to above plan.  Patient was given contact information for behavioral health [facility] and was instructed to call 911 for emergencies.    Patient and plan of care will be discussed with the Attending MD, Dr. Josephina Hill, who agrees with the above statement and plan.   Subjective:  Chief Complaint: Medication Management   Interval History:  Patient seen for follow-up regarding medication management.  She denies SI/HI/AVH.  She reports appetite has been fair but reports concern that she is gaining weight and thinks this may be due to psychotropics. She reports sleep was good up until last night because she forgot to take doxepin last night. She reports continuing to drink 2 beers per day, half a joint of cannabis per day, and 3 cigars per day. She was amenable to starting metformin for antipsychotic induced weight gain but she would like to be started on a GLP-1 [facility] or Ozempic) but she will speak with PCP about this.  She reports holidays are mildly stressful for her but otherwise feels that her anxiety is manageable.  All questions were addressed.  Visit Diagnosis:    ICD-10-CM   1. Schizophrenia, unspecified type (HCC)  F20.9 doxepin (SINEQUAN) 150 MG capsule    divalproex (DEPAKOTE ER) 500 MG 24 hr tablet    metFORMIN (GLUCOPHAGE) 500 MG tablet    2. Insomnia due to mental condition  F51.05 doxepin (SINEQUAN) 150 MG capsule    3. Anxiety state  F41.1 hydrOXYzine (VISTARIL) 25 MG capsule    4. Cigarette nicotine dependence with nicotine-induced disorder  F17.219 nicotine (NICODERM CQ - DOSED IN MG/24  HOURS) 21 mg/24hr patch    nicotine polacrilex (NICORETTE) 4 MG gum       Past Psychiatric History:  Diagnoses: Schizophrenia, alcohol use disorder, bipolar disorder Medication trials: Lurasidone, risperidone, haloperidol, Zyprexa Previous psychiatrist/therapist: Gretchen [facility] Hospitalizations: several, last 1 noted to the July 2022 Suicide attempts: at least once via bleach ingestion Hx of violence towards others: endorses when psychotic Current access to guns: denies Substance use: alcohol, in sustained remission  Past Medical History:  Past Medical History:  Diagnosis Date   BIPOLAR DISORDER    Chest pain 01/20/2016   Delusions (HCC)    Depression    Dyspnea 10/13/2012   Healthcare maintenance 11/11/2021   Heart murmur    "MILD"   Homelessness    Left ventricular hypertrophy    Palpitations    Psychosis (HCC)    Sickle cell trait (HCC)    Substance abuse (HCC)    Weight gain 11/11/2021    Past Surgical History:  Procedure Laterality Date   BUNIONECTOMY     SKIN BIOPSY        Family History:  Family History  Problem Relation Age of Onset   Heart disease Mother        CHF   Prostate cancer Father    Colon polyps Neg Hx    Colon cancer Neg Hx    Crohn's disease Neg Hx    Esophageal cancer Neg Hx    Rectal cancer Neg Hx    Stomach cancer Neg Hx    Ulcerative colitis Neg Hx     Social History:  Academic/Vocational: disability Social History   Socioeconomic History   Marital status: Single    Spouse name: Not on file   Number of children: 2   Years of education: Not on file   Highest education level: Not on file  Occupational History    Comment: Unemployed  Tobacco Use   Smoking status: Every Day    Current packs/day: 1.00    Average packs/day: 1 pack/day for 36.0 years (36.0 ttl pk-yrs)    Types: Cigarettes, Cigars    Start date: 11/02/1987    Passive exposure: Never   Smokeless tobacco: Never   Tobacco comments:    Still smokes one pack of cigars per day.  States still trying to cut back.   Vaping Use   Vaping status: Never Used  Substance and Sexual Activity   Alcohol use: Yes    Comment: everyday 20-80oz   Drug use: Not Currently    Types: Cocaine    Comment: Previous cocaine, heroin, marijuana,CLEAN 21  YEARS UPDATED 09/06/22   Sexual activity: Not Currently  Other Topics Concern   Not on file  Social History Narrative   Not on file   Social Drivers of Health   Financial Resource Strain: Low Risk  (07/15/2021)   Overall Financial Resource Strain (CARDIA)    Difficulty of Paying Living Expenses: Not hard at all  Food Insecurity: No Food Insecurity (07/15/2021)   Hunger Vital Sign    Worried About Running Out of Food in the Last Year: Never true    Ran Out of Food in the Last Year: Never true  Transportation Needs: No Transportation Needs (07/15/2021)   PRAPARE - Administrator, Civil Service (Medical): No    Lack of Transportation (Non-Medical): No  Physical Activity: Not on file  Stress: Not on file  Social Connections: Not on file    Allergies:  Allergies  Allergen Reactions  Strawberry Extract Anaphylaxis   Risperidone And Related Hives, Nausea Only and Other (See Comments)    Made me feel jittery and restless/ Pt denies this allergy on 01/27/21   Latuda [Lurasidone Hcl] Other (See Comments)    Hot and cold flashes    Current Medications: Current Outpatient Medications  Medication Sig Dispense Refill   metFORMIN (GLUCOPHAGE) 500 MG tablet Take 1 tablet (500 mg total) by mouth daily with breakfast. 30 tablet 2   nicotine polacrilex (NICORETTE) 4 MG gum Take 1 each (4 mg total) by mouth as needed for smoking cessation. 100 tablet 0   amLODipine (NORVASC) 5 MG tablet TAKE 1 TABLET(5 MG) BY MOUTH AT BEDTIME 90 tablet 0   divalproex (DEPAKOTE ER) 500 MG 24 hr tablet Take 2 tablets (1,000 mg total) by mouth daily at 10 pm. 60 tablet 3   doxepin (SINEQUAN) 150 MG capsule Take 1 capsule (150 mg total) by mouth at bedtime. 30 capsule 3   haloperidol decanoate (HALDOL DECANOATE) 100 MG/ML injection Inject 1 mL (100 mg total) into the muscle every 30 (thirty) days. 1 mL 3   hydrOXYzine (VISTARIL) 25 MG capsule Take 1 capsule (25 mg total) by mouth 3 (three) times daily as  needed. 60 capsule 3   nicotine (NICODERM CQ - DOSED IN MG/24 HOURS) 21 mg/24hr patch Place 1 patch (21 mg total) onto the skin every morning. 30 patch 3   No current facility-administered medications for this visit.    ROS: Review of Systems   Objective:  Psychiatric Specialty Exam: There were no vitals taken for this visit.There is no height or weight on file to calculate BMI.  General Appearance: Casual  Eye Contact: Good  Speech:  Clear and Coherent and Normal Rate  Volume:  Normal  Mood:  Euthymic  Affect:  Appropriate and Congruent  Thought Content: Logical   Suicidal Thoughts:  No  Homicidal Thoughts:  No  Thought Process:  Coherent, Goal Directed, and Linear  Orientation:  Full (Time, Place, and Person)    Memory: Remote;   Fair  Judgment:  Intact  Insight:  Fair  Concentration:  Concentration: Fair  Recall: not formally assessed   Fund of Knowledge: Fair  Language: Fair  Psychomotor Activity:  Normal  Akathisia:  No  AIMS (if indicated): not done  Assets:  Communication Skills Desire for Improvement Financial Resources/Insurance Housing Leisure Time Physical Health Resilience Social Support Talents/Skills Transportation  ADL's:  Intact  Cognition: WNL  Sleep:  Fair   PE: General: well-appearing; no acute distress  Pulm: no increased work of breathing on room air  Strength & Muscle Tone: within normal limits Neuro: no focal neurological deficits observed  Gait & Station: normal  Metabolic Disorder Labs: Lab Results  Component Value Date   HGBA1C 6.0 (H) 07/13/2023   MPG 125.5 05/08/2021   Lab Results  Component Value Date   PROLACTIN 11.8 07/13/2023   PROLACTIN 2.6 (L) 05/08/2021   Lab Results  Component Value Date   CHOL 221 (H) 07/13/2023   TRIG 162 (H) 07/13/2023   HDL 68 07/13/2023   CHOLHDL 3.3 07/13/2023   VLDL 13 05/08/2021   LDLCALC 125 (H) 07/13/2023   LDLCALC 104 (H) 03/22/2022   Lab Results  Component Value Date   TSH  4.210 07/13/2023   TSH 3.290 03/22/2022    Therapeutic Level Labs: No results found for: "LITHIUM" Lab Results  Component Value Date   VALPROATE 83 09/20/2023   VALPROATE <4 (L) 07/13/2023  Lab Results  Component Value Date   CBMZ <2.0 (L) 01/08/2008    Screenings: AIMS    Flowsheet Row Office Visit from 02/08/2023 in [facility] Office Visit from 09/02/2022 in [facility] Office Visit from 04/08/2022 in [facility] Office Visit from 02/09/2022 in [facility] (Dis[facility]  AIMS Total Score 5 3 0 0 0      AUDIT    Flowsheet Row Office Visit from 05/12/2023 in [facility] (Discharged) from [facility]  Alcohol Use Disorder Identification Test Final Score (AUDIT) 10 5      GAD-7    Flowsheet Row Office Visit from 05/12/2023 in [facility] Office Visit from 02/08/2023 in [facility] Office Visit from 12/02/2022 in [facility] Office Visit from 09/02/2022 in [facility] Office Visit from 04/08/2022 in [facility]  Total GAD-7 Score 6 4 9 3  0      PHQ2-9    Flowsheet Row Office Visit from 09/20/2023 in [facility] [place] Office Visit from 07/12/2023 in [facility] [place] Office Visit from 05/12/2023 in [facility] Office Visit from 02[facility]08/2023 in [facility] Office Visit from 12/30/2022 in [facility] [place]  PHQ-2 Total Score 0 1 1 1 2   PHQ-9 Total Score 7 10 12 9 11        Flowsheet Row Office Visit from 04/08/2022 in [facility] from 11/17/2021 in [facility] from 07/01/2021 in [facility] Emergency Department at [facility]  C-SSRS RISK CATEGORY No Risk No Risk No Risk       Collaboration of Care: Collaboration of Care:  Patient/Guardian was advised Release of Information must be obtained prior to any record release in order to collaborate their care with an outside provider. Patient/Guardian was advised if they have not already done so to contact the registration department to sign all necessary forms in order for [place] to release information regarding their care.   Consent: Patient/Guardian gives verbal consent for treatment and assignment of benefits for services provided during this visit. Patient/Guardian expressed understanding and agreed to proceed.   Park Pope, MD 10/18/2023, 3:35 PM

## 2023-10-18 ENCOUNTER — Ambulatory Visit (INDEPENDENT_AMBULATORY_CARE_PROVIDER_SITE_OTHER): Payer: MEDICAID | Admitting: Student

## 2023-10-18 ENCOUNTER — Ambulatory Visit (INDEPENDENT_AMBULATORY_CARE_PROVIDER_SITE_OTHER): Payer: MEDICAID

## 2023-10-18 ENCOUNTER — Encounter (HOSPITAL_COMMUNITY): Payer: Self-pay

## 2023-10-18 ENCOUNTER — Ambulatory Visit: Payer: MEDICAID | Admitting: Family Medicine

## 2023-10-18 VITALS — BP 130/93 | HR 99 | Wt 218.0 lb

## 2023-10-18 DIAGNOSIS — F411 Generalized anxiety disorder: Secondary | ICD-10-CM

## 2023-10-18 DIAGNOSIS — F209 Schizophrenia, unspecified: Secondary | ICD-10-CM | POA: Diagnosis not present

## 2023-10-18 DIAGNOSIS — F17219 Nicotine dependence, cigarettes, with unspecified nicotine-induced disorders: Secondary | ICD-10-CM

## 2023-10-18 DIAGNOSIS — F200 Paranoid schizophrenia: Secondary | ICD-10-CM | POA: Diagnosis not present

## 2023-10-18 DIAGNOSIS — G4700 Insomnia, unspecified: Secondary | ICD-10-CM

## 2023-10-18 DIAGNOSIS — F5105 Insomnia due to other mental disorder: Secondary | ICD-10-CM

## 2023-10-18 MED ORDER — DIVALPROEX SODIUM ER 500 MG PO TB24
1000.0000 mg | ORAL_TABLET | Freq: Every day | ORAL | 3 refills | Status: DC
Start: 2023-10-18 — End: 2024-01-25

## 2023-10-18 MED ORDER — HALOPERIDOL DECANOATE 100 MG/ML IM SOLN
100.0000 mg | Freq: Once | INTRAMUSCULAR | Status: AC
  Administered 2023-10-18: 100 mg via INTRAMUSCULAR

## 2023-10-18 MED ORDER — DOXEPIN HCL 150 MG PO CAPS
150.0000 mg | ORAL_CAPSULE | Freq: Every day | ORAL | 3 refills | Status: DC
Start: 2023-10-18 — End: 2024-01-25

## 2023-10-18 MED ORDER — NICOTINE POLACRILEX 4 MG MT GUM: 4.0000 mg | CHEWING_GUM | OROMUCOSAL | 0 refills | Status: AC | PRN

## 2023-10-18 MED ORDER — NICOTINE 21 MG/24HR TD PT24: 21.0000 mg | MEDICATED_PATCH | Freq: Every morning | TRANSDERMAL | 3 refills | Status: DC

## 2023-10-18 MED ORDER — METFORMIN HCL 500 MG PO TABS: 500.0000 mg | ORAL_TABLET | Freq: Every day | ORAL | 2 refills | Status: AC

## 2023-10-18 MED ORDER — HYDROXYZINE PAMOATE 25 MG PO CAPS: 25.0000 mg | ORAL_CAPSULE | Freq: Three times a day (TID) | ORAL | 3 refills | Status: DC | PRN

## 2023-10-18 NOTE — Progress Notes (Cosign Needed)
PATIENT ARRIVED FOR haloperidol decanoate (HALDOL DECANOATE) 100 MG/ML injection 100 mg  WHICH IS OVERDUE  100 mg, Intramuscular, Every 28 days   TOLERATED INJECTION WELL IN Right  UPPER QUAD  & PLEASANT AS ALWAYS.

## 2023-11-03 DIAGNOSIS — H5213 Myopia, bilateral: Secondary | ICD-10-CM | POA: Diagnosis not present

## 2023-11-16 ENCOUNTER — Encounter (HOSPITAL_COMMUNITY): Payer: Self-pay

## 2023-11-16 ENCOUNTER — Ambulatory Visit (HOSPITAL_COMMUNITY): Payer: Medicaid Other

## 2023-11-16 ENCOUNTER — Other Ambulatory Visit (HOSPITAL_COMMUNITY): Payer: Self-pay | Admitting: Family

## 2023-11-16 VITALS — BP 134/96 | HR 99 | Wt 211.8 lb

## 2023-11-16 DIAGNOSIS — F411 Generalized anxiety disorder: Secondary | ICD-10-CM

## 2023-11-16 DIAGNOSIS — G4700 Insomnia, unspecified: Secondary | ICD-10-CM

## 2023-11-16 DIAGNOSIS — F200 Paranoid schizophrenia: Secondary | ICD-10-CM | POA: Diagnosis not present

## 2023-11-16 DIAGNOSIS — F209 Schizophrenia, unspecified: Secondary | ICD-10-CM

## 2023-11-16 MED ORDER — HALOPERIDOL DECANOATE 100 MG/ML IM SOLN: 100.0000 mg | INTRAMUSCULAR | 3 refills | Status: DC

## 2023-11-16 MED ORDER — HALOPERIDOL DECANOATE 100 MG/ML IM SOLN
100.0000 mg | Freq: Once | INTRAMUSCULAR | Status: AC
  Administered 2023-11-16: 100 mg via INTRAMUSCULAR

## 2023-11-16 NOTE — Progress Notes (Unsigned)
 [facility]ita [facility] 54 year old African-American female presents to [facility] ([facility] for medication management.  Orders placed for Haldol  decanoate and she is currently prescribed haloperidol  100 mg/mL.

## 2023-11-16 NOTE — Progress Notes (Cosign Needed)
 PATIENT ARRIVED FOR haloperidol decanoate (HALDOL DECANOATE) 100 MG/ML injection 100 mg  WHICH IS OVERDUE  100 mg, Intramuscular, Every 28 days   TOLERATED INJECTION WELL IN Right  UPPER QUAD  & PLEASANT AS ALWAYS.

## 2023-12-06 ENCOUNTER — Encounter: Payer: Self-pay | Admitting: Family Medicine

## 2023-12-06 ENCOUNTER — Ambulatory Visit (INDEPENDENT_AMBULATORY_CARE_PROVIDER_SITE_OTHER): Payer: Medicaid Other | Admitting: Family Medicine

## 2023-12-06 ENCOUNTER — Other Ambulatory Visit: Payer: Self-pay

## 2023-12-06 VITALS — BP 114/97 | HR 93 | Ht 62.0 in | Wt 223.2 lb

## 2023-12-06 DIAGNOSIS — F29 Unspecified psychosis not due to a substance or known physiological condition: Secondary | ICD-10-CM

## 2023-12-06 DIAGNOSIS — E8881 Metabolic syndrome: Secondary | ICD-10-CM

## 2023-12-06 DIAGNOSIS — Z6841 Body Mass Index (BMI) 40.0 and over, adult: Secondary | ICD-10-CM | POA: Insufficient documentation

## 2023-12-06 DIAGNOSIS — E669 Obesity, unspecified: Secondary | ICD-10-CM

## 2023-12-06 DIAGNOSIS — R21 Rash and other nonspecific skin eruption: Secondary | ICD-10-CM | POA: Diagnosis not present

## 2023-12-06 DIAGNOSIS — I10 Essential (primary) hypertension: Secondary | ICD-10-CM

## 2023-12-06 DIAGNOSIS — I509 Heart failure, unspecified: Secondary | ICD-10-CM | POA: Diagnosis not present

## 2023-12-06 DIAGNOSIS — R7303 Prediabetes: Secondary | ICD-10-CM | POA: Insufficient documentation

## 2023-12-06 HISTORY — DX: Rash and other nonspecific skin eruption: R21

## 2023-12-06 LAB — POCT GLYCOSYLATED HEMOGLOBIN (HGB A1C): HbA1c, POC (prediabetic range): 5.8 % (ref 5.7–6.4)

## 2023-12-06 MED ORDER — WEGOVY 0.25 MG/0.5ML ~~LOC~~ SOAJ: 0.2500 mg | SUBCUTANEOUS | 0 refills | Status: DC

## 2023-12-06 NOTE — Patient Instructions (Signed)
 Your rash could be due to venous stasis. I do not think this is due to an infection. I recommend compression stockings at this time, but if it worsens with stockings and elevation, please be sure to let me know.  I can send in wegovy  for your weight. I am happy they will approve it!  We obtained an EKG today.

## 2023-12-06 NOTE — Assessment & Plan Note (Addendum)
Persistently elevated diastolic today on recheck.  Will increase amlodipine to 10 mg.  Follow-up at next visit.

## 2023-12-06 NOTE — Assessment & Plan Note (Signed)
A1c today 5.8 down from 6.0.  [name] initiation can also help further bring this down.

## 2023-12-06 NOTE — Assessment & Plan Note (Signed)
Use of antipsychotics likely contributing to metabolic syndrome.  Given consistently increasing BMI, will send in Wegovy 0.25 mg weekly.  Reassuringly no history or family history of thyroid cancer.  Follow-up tolerance at next visit.

## 2023-12-06 NOTE — Assessment & Plan Note (Addendum)
 Rash on bilateral lower extremities reassuringly without systemic symptoms or signs of infection.  With increasing swelling, consider venous stasis, though more acute onset would be less likely. Capillaritis and vasculitis are also on the differential.  Given largely asymptomatic nature, recommended compression stockings and frequent leg elevation.  Advised to let me know if worsening after 1 week. Would trial topical steroid and consider basic labs along with ESR, CRP, ANA, and ANCA at that time. Can also discuss switching off of amlodipine  if venous stasis remains high on differential given increased risk of bilateral lower extremity edema.

## 2023-12-06 NOTE — Progress Notes (Addendum)
    SUBJECTIVE:   CHIEF COMPLAINT / HPI:   Rash on bilateral legs Was in a man's car recently that was not the cleanest, and she developed the rash on the lower leg.  Has been there for about a week.  Has slowly been getting worse.  Has also noticed more swelling in the bilateral lower extremities.  No pain, itching, fevers.  No new exposures to detergents or soaps.  No insect bites.  She has not tried any medications or creams on the areas.  Her legs do get less swollen when she lifts them up.  Need for weight loss Has steadily saw her weight increase, she is still interested in a medication like Wegovy  to help.  She talked with her insurance company who said they would cover this.  PERTINENT  PMH / PSH: Hypertension, CHF (recent LVEF 60 to 65%), tardive dyskinesia, tobacco use, schizophrenia with psychotic disorder, bipolar 1 disorder, alcohol use disorder  OBJECTIVE:   BP (!) 114/97   Pulse 93   Ht 5' 2 (1.575 m)   Wt 223 lb 3.2 oz (101.2 kg)   SpO2 100%   BMI 40.82 kg/m   General: Alert and oriented, in NAD Skin: Warm, dry, and intact; bilateral lower extremities with intermittent hyperpigmented and slightly raised papules that do not blanch and are not painful to the touch HEENT: NCAT, EOM grossly normal, midline nasal septum Cardiac: Regular rate, bilateral nonpitting edema of the lower extremities, bilateral dorsalis pedis pulses intact Respiratory: Breathing and speaking comfortably on RA Abdominal: Nondistended Extremities: Moves all extremities grossly equally Neurological: No gross focal deficit Psychiatric: Appropriate mood and affect, PHQ-9 equals 5 with negative #9   Right leg   Left leg  ASSESSMENT/PLAN:   Hypertension Persistently elevated diastolic today on recheck.  Will increase amlodipine  to 10 mg.  Follow-up at next visit.  Congestive heart failure Echo in 2017 with LVEF 60 to 65%.  Bilateral lower extremity edema could indicate worsening, though  reassured by lack of pitting.  Can consider repeat echo as clinically necessary.  Schizophrenia spectrum disorder with psychotic disorder type not yet determined [facility]) Continue follow-up with psychiatry given stability today on exam and PHQ-9.  BMI 40.0-44.9, adult (HCC) Use of antipsychotics likely contributing to metabolic syndrome.  Given consistently increasing BMI, will send in Wegovy  0.25 mg weekly.  Reassuringly no history or family history of thyroid  cancer.  Follow-up tolerance at next visit.  Rash Rash on bilateral lower extremities reassuringly without systemic symptoms or signs of infection.  With increasing swelling, consider venous stasis, though more acute onset would be less likely. Capillaritis and vasculitis are also on the differential.  Given largely asymptomatic nature, recommended compression stockings and frequent leg elevation.  Advised to let me know if worsening after 1 week. Would trial topical steroid and consider basic labs along with ESR, CRP, ANA, and ANCA at that time. Can also discuss switching off of amlodipine  if venous stasis remains high on differential given increased risk of bilateral lower extremity edema.   Prediabetes A1c today 5.8 down from 6.0.  Wegovy  initiation can also help further bring this down.   Health maintenance Consider lung cancer screening, mammogram, and vaccinations at next visit in 2 months.  Stuart Redo, MD [facility]

## 2023-12-06 NOTE — Assessment & Plan Note (Signed)
Continue follow-up with psychiatry given stability today on exam and PHQ-9.

## 2023-12-06 NOTE — Assessment & Plan Note (Signed)
Echo in 2017 with LVEF 60 to 65%.  Bilateral lower extremity edema could indicate worsening, though reassured by lack of pitting.  Can consider repeat echo as clinically necessary.

## 2023-12-09 ENCOUNTER — Telehealth: Payer: Self-pay | Admitting: Family Medicine

## 2023-12-09 NOTE — Telephone Encounter (Signed)
 Spoke with patient's guardian and daughter about no-shows at recent appointments. Updated on policy. Daughter states she has work a lot of times and cannot drive her to appointments; however, she will soon qualify for FMLA to help get her mom to her appointments. She states she will also be more diligent about canceling ahead of time if a conflict is known. She was very appreciative of the call.

## 2023-12-12 ENCOUNTER — Other Ambulatory Visit (HOSPITAL_COMMUNITY): Payer: Self-pay

## 2023-12-14 ENCOUNTER — Ambulatory Visit (HOSPITAL_COMMUNITY): Payer: Medicaid Other

## 2023-12-14 ENCOUNTER — Other Ambulatory Visit (HOSPITAL_COMMUNITY): Payer: Self-pay

## 2023-12-14 ENCOUNTER — Telehealth: Payer: Self-pay

## 2023-12-14 NOTE — Telephone Encounter (Signed)
Pharmacy Patient Advocate Encounter   Received notification from CoverMyMeds that prior authorization for [facility] is required/requested.   Insurance verification completed.   The patient is insured through [facility] MEDICAID .  Appropriate documentation must be submitted with weight loss medication prior authorizations: weight loss attempts and lifestyle changes (including diet and exercise), and how long this was utilized. This will also need to include a statement that the patient is committed to these changes and will continue efforts.

## 2023-12-19 ENCOUNTER — Other Ambulatory Visit: Payer: Self-pay | Admitting: Family Medicine

## 2023-12-23 ENCOUNTER — Telehealth: Payer: Self-pay | Admitting: Family Medicine

## 2023-12-23 NOTE — Telephone Encounter (Signed)
 Camille- do we have an update on the PA?

## 2023-12-23 NOTE — Telephone Encounter (Signed)
 Patient walked in to request refill of:  Name of Medication(s):  Wygovy Last date of OV:  12/06/23 [facility]acy:  [facility]   Patient said when went to the pharmacy and the pharmacist said the doctor needs to fill out a form.  Will route refill request to Clinic RN.  Discussed with patient policy to call pharmacy for future refills.  Also, discussed refills may take up to 48 hours to approve or deny.  Vilinda Blanks

## 2023-12-28 ENCOUNTER — Telehealth (HOSPITAL_COMMUNITY): Payer: Self-pay

## 2023-12-28 ENCOUNTER — Ambulatory Visit (INDEPENDENT_AMBULATORY_CARE_PROVIDER_SITE_OTHER): Payer: Medicaid Other

## 2023-12-28 VITALS — BP 131/89 | HR 90 | Ht 62.0 in | Wt 220.0 lb

## 2023-12-28 DIAGNOSIS — F319 Bipolar disorder, unspecified: Secondary | ICD-10-CM | POA: Diagnosis not present

## 2023-12-28 DIAGNOSIS — F209 Schizophrenia, unspecified: Secondary | ICD-10-CM

## 2023-12-28 MED ORDER — HALOPERIDOL DECANOATE 100 MG/ML IM SOLN
100.0000 mg | Freq: Once | INTRAMUSCULAR | Status: AC
  Administered 2023-12-28: 100 mg via INTRAMUSCULAR

## 2023-12-28 MED ORDER — HALOPERIDOL DECANOATE 100 MG/ML IM SOLN
100.0000 mg | INTRAMUSCULAR | 3 refills | Status: DC
Start: 2023-12-28 — End: 2024-05-17

## 2023-12-28 NOTE — Telephone Encounter (Signed)
 Hello,   Pt was seen today and has no refills for haloperidol decanoate (HALDOL DECANOATE) 100 MG/ML injection.   Today we went and went downstairs to get a sample HALDOL, but when asked could she bring her next one to her next app, she stated that there was no orders sent in. Could you please assist with this.

## 2023-12-28 NOTE — Addendum Note (Signed)
 Addended by: Park Pope B on: 12/28/2023 05:02 PM   Modules accepted: Orders

## 2023-12-28 NOTE — Progress Notes (Cosign Needed)
 Pt presents today for injection of Haldol 1ml administered in left upper outer quadrant. Pt has no complaints  regarding injection and is in a great mood. She is lovely as always. States that she does have a rash that she would like to be examined told pt to inquire to her PCP about her conditions, or maybe to inquire to a dermatologist.  Pt is very understanding and denies any AVH, SI and HI.

## 2023-12-28 NOTE — Telephone Encounter (Signed)
 Refill sent to preferred pharmacy.

## 2024-01-09 ENCOUNTER — Other Ambulatory Visit (HOSPITAL_COMMUNITY): Payer: Self-pay

## 2024-01-11 NOTE — Progress Notes (Deleted)
 BH MD Outpatient Progress Note  01/11/2024 2:30 PM Anita Hill  MRN:  [id number]  Assessment:  Anita Hill presents for follow-up evaluation in-person. Today, 01/11/24, patient reports relative stability with regards to mood and anxiety since last visit. Current medication regiment is depakote 1000 mg at bedtime, haldol dec 100 mg daily, doxepin 150 mg nightly, and hydroxyzine 25 mg tid.    She currently denies any acute concerns regarding mood nor psychosis .She corrected that amenorrrhea has only been since 4 years ago and confirmed to likely be menopause per PCP. Due to concerns regarding weight gain, we started her on metformin and have reached out to PCP regarding possibly going on GLP1 for weight loss.  She was also advised to decrease/cease use of alcohol and cannabis as this is likely contributing to her weight as well. Plan to continue current medication regimen and she will follow-up with me in approximately 2-3 months for medication management.  Identifying Information: Anita Hill is a 53 y.o.female w/ hx of schizophrenia, nicotine dependence, bipolar disorder, alcohol use disorder in sustained remission who is an established patient with [facility] for medication management. Daughter Joanna Hews is guardian.   Plan:  # Schizophrenia spectrum disorder Past medication trials:  Status of problem: stable Interventions: -- Continue Haldol decanoate 100 mg every 30 days  -07/13/2023 A1c 6.0, LDL cholesterol 125 --Continue hydroxyzine 25 mg 3 times daily as needed --continue doxepin 150 mg nightly --Continue Depakote 1000 mg daily  -last level was inaccurate due to missing prescription for a few days  -VPA level 09/20/2023 appropriate -start metformin 500 mg daily with breakfast  # Nicotine dependence #Cannabis Use Disorder #Alcohol Use Disorder Past medication trials:  Interventions: -- Continue NicoDerm patch -- start nicotine gum --  advise cessation   Return to care in 2 months  Guardian Joanna Hews (daughter) was informed and was amenable to above plan.  Patient was given contact information for behavioral health clinic and was instructed to call 911 for emergencies.    Patient and plan of care will be discussed with the Attending MD, Dr. Josephina Shih, who agrees with the above statement and plan.   Subjective:  Chief Complaint: Medication Management   Interval History:  Patient seen for follow-up regarding medication management.  She denies SI/HI/AVH.  She reports appetite has been fair but reports concern that she is gaining weight and thinks this may be due to psychotropics. She reports sleep was good up until last night because she forgot to take doxepin last night. She reports continuing to drink 2 beers per day, half a joint of cannabis per day, and 3 cigars per day. She was amenable to starting metformin for antipsychotic induced weight gain but she would like to be started on a GLP-1 [facility] or Ozempic) but she will speak with PCP about this.  She reports holidays are mildly stressful for her but otherwise feels that her anxiety is manageable.  All questions were addressed.  Visit Diagnosis:  No diagnosis found.    Past Psychiatric History:  Diagnoses: Schizophrenia, alcohol use disorder, bipolar disorder Medication trials: Lurasidone, risperidone, haloperidol, Zyprexa Previous psychiatrist/therapist: Gretchen Short Hospitalizations: several, last 1 noted to the July 2022 Suicide attempts: at least once via bleach ingestion Hx of violence towards others: endorses when psychotic Current access to guns: denies Substance use: alcohol, in sustained remission  Past Medical History:  Past Medical History:  Diagnosis Date   BIPOLAR DISORDER    Chest pain 01/20/2016  Delusions (HCC)    Depression    Dyspnea 10/13/2012   Healthcare maintenance 11/11/2021   Heart murmur    "MILD"   Homelessness     Left ventricular hypertrophy    Palpitations    Psychosis (HCC)    Sickle cell trait (HCC)    Substance abuse (HCC)    Weight gain 11/11/2021    Past Surgical History:  Procedure Laterality Date   BUNIONECTOMY     SKIN BIOPSY        Family History:  Family History  Problem Relation Age of Onset   Heart disease Mother        CHF   Prostate cancer Father    Colon polyps Neg Hx    Colon cancer Neg Hx    Crohn's disease Neg Hx    Esophageal cancer Neg Hx    Rectal cancer Neg Hx    Stomach cancer Neg Hx    Ulcerative colitis Neg Hx     Social History:  Academic/Vocational: disability Social History   Socioeconomic History   Marital status: Single    Spouse name: Not on file   Number of children: 2   Years of education: Not on file   Highest education level: Not on file  Occupational History    Comment: Unemployed  Tobacco Use   Smoking status: Every Day    Current packs/day: 1.00    Average packs/day: 1 pack/day for 36.2 years (36.2 ttl pk-yrs)    Types: Cigarettes, Cigars    Start date: 11/02/1987    Passive exposure: Never   Smokeless tobacco: Never   Tobacco comments:    Still smokes one pack of cigars per day.  States still trying to cut back.   Vaping Use   Vaping status: Never Used  Substance and Sexual Activity   Alcohol use: Yes    Comment: everyday 20-80oz   Drug use: Not Currently    Types: Cocaine    Comment: Previous cocaine, heroin, marijuana,CLEAN 21 YEARS UPDATED 09/06/22   Sexual activity: Not Currently  Other Topics Concern   Not on file  Social History Narrative   Not on file   Social Drivers of Health   Financial Resource Strain: Low Risk  (07/15/2021)   Overall Financial Resource Strain (CARDIA)    Difficulty of Paying Living Expenses: Not hard at all  Food Insecurity: No Food Insecurity (07/15/2021)   Hunger Vital Sign    Worried About Running Out of Food in the Last Year: Never true    Ran Out of Food in the Last Year: Never true   Transportation Needs: No Transportation Needs (07/15/2021)   PRAPARE - Administrator, Civil Service (Medical): No    Lack of Transportation (Non-Medical): No  Physical Activity: Not on file  Stress: Not on file  Social Connections: Not on file    Allergies:  Allergies  Allergen Reactions   Strawberry Extract Anaphylaxis   Risperidone And Related Hives, Nausea Only and Other (See Comments)    Made me feel jittery and restless/ Pt denies this allergy on 01/27/21   Latuda [Lurasidone Hcl] Other (See Comments)    Hot and cold flashes    Current Medications: Current Outpatient Medications  Medication Sig Dispense Refill   amLODipine (NORVASC) 5 MG tablet TAKE 1 TABLET(5 MG) BY MOUTH AT BEDTIME 90 tablet 3   divalproex (DEPAKOTE ER) 500 MG 24 hr tablet Take 2 tablets (1,000 mg total) by mouth daily at 10 pm. 60  tablet 3   doxepin (SINEQUAN) 150 MG capsule Take 1 capsule (150 mg total) by mouth at bedtime. 30 capsule 3   haloperidol decanoate (HALDOL DECANOATE) 100 MG/ML injection Inject 1 mL (100 mg total) into the muscle every 30 (thirty) days. 1 mL 3   hydrOXYzine (VISTARIL) 25 MG capsule Take 1 capsule (25 mg total) by mouth 3 (three) times daily as needed. 60 capsule 3   metFORMIN (GLUCOPHAGE) 500 MG tablet Take 1 tablet (500 mg total) by mouth daily with breakfast. 30 tablet 2   nicotine (NICODERM CQ - DOSED IN MG/24 HOURS) 21 mg/24hr patch Place 1 patch (21 mg total) onto the skin every morning. 30 patch 3   nicotine polacrilex (NICORETTE) 4 MG gum Take 1 each (4 mg total) by mouth as needed for smoking cessation. 100 tablet 0   Semaglutide-Weight Management (WEGOVY) 0.25 MG/0.5ML SOAJ Inject 0.25 mg into the skin once a week. Contact doctor after 1 month of use to discuss increasing dose. 2 mL 0   No current facility-administered medications for this visit.    ROS: Review of Systems   Objective:  Psychiatric Specialty Exam: There were no vitals taken for this  visit.There is no height or weight on file to calculate BMI.  General Appearance: Casual  Eye Contact: Good  Speech:  Clear and Coherent and Normal Rate  Volume:  Normal  Mood:  Euthymic  Affect:  Appropriate and Congruent  Thought Content: Logical   Suicidal Thoughts:  No  Homicidal Thoughts:  No  Thought Process:  Coherent, Goal Directed, and Linear  Orientation:  Full (Time, Place, and Person)    Memory: Remote;   Fair  Judgment:  Intact  Insight:  Fair  Concentration:  Concentration: Fair  Recall: not formally assessed   Fund of Knowledge: Fair  Language: Fair  Psychomotor Activity:  Normal  Akathisia:  No  AIMS (if indicated): not done  Assets:  Communication Skills Desire for Improvement Financial Resources/Insurance Housing Leisure Time Physical Health Resilience Social Support Talents/Skills Transportation  ADL's:  Intact  Cognition: WNL  Sleep:  Fair   PE: General: well-appearing; no acute distress  Pulm: no increased work of breathing on room air  Strength & Muscle Tone: within normal limits Neuro: no focal neurological deficits observed  Gait & Station: normal  Metabolic Disorder Labs: Lab Results  Component Value Date   HGBA1C 5.8 12/06/2023   MPG 125.5 05/08/2021   Lab Results  Component Value Date   PROLACTIN 11.8 07/13/2023   PROLACTIN 2.6 (L) 05/08/2021   Lab Results  Component Value Date   CHOL 221 (H) 07/13/2023   TRIG 162 (H) 07/13/2023   HDL 68 07/13/2023   CHOLHDL 3.3 07/13/2023   VLDL 13 05/08/2021   LDLCALC 125 (H) 07/13/2023   LDLCALC 104 (H) 03/22/2022   Lab Results  Component Value Date   TSH 4.210 07/13/2023   TSH 3.290 03/22/2022    Therapeutic Level Labs: No results found for: "LITHIUM" Lab Results  Component Value Date   VALPROATE 83 09/20/2023   VALPROATE <4 (L) 07/13/2023   Lab Results  Component Value Date   CBMZ <2.0 (L) 01/08/2008    Screenings: AIMS    Flowsheet Row Office Visit from 02/08/2023 in  [facility] Office Visit from 09/02/2022 in [facility] Office Visit from 04/08/2022 in [facility] Office Visit from 02/09/2022 in [facility] (Discharged[facility]  AIMS Total Score 5 3 0 0 0      AUDIT    Flowsheet Row Office Visit from 05/12/2023 in [facility] Admission (Discharged) from [facility]/09/[facility]  Alcohol Use Disorder Identification Test Final Score (AUDIT) 10 5      GAD-7    Flowsheet Row Office Visit from 05/12/2023 in [facility] Office Visit from 02/08/2023 in [facility] Office Visit from 12/02/2022 in [facility] Office Visit from 09/02/2022 in [facility] Office Visit from 04/08/2022 in [facility]  Total GAD-7 Score 6 4 9 3  0      PHQ2-9    Flowsheet Row Office Visit from 09/20/2023 in [facility] Of [place] Office Visit from 07/12/2023 in [facility] - [facility] Dept Of [place] Office Visit from 05/12/2023 in [facility] Office Visit from 02/08/2023 in [facility] Office Visit from 12/30/2022 in [facility] - [facility] Dept Of [place]. [facility]  PHQ-2 Total Score 0 1 1 1 2   PHQ-9 Total Score 7 10 12 9 11       Flowsheet Row Office Visit from 04/08/2022 in [facility] Counselor from 11/17/2021 in [facility] ED from 07/01/2021 in [facility] Emergency Department at [facility]  C-SSRS RISK CATEGORY No Risk No Risk No Risk       Collaboration of Care: Collaboration of  Care:  Patient/Guardian was advised Release of Information must be obtained prior to any record release in order to collaborate their care with an outside provider. Patient/Guardian was advised if they have not already done so to contact the registration department to sign all necessary forms in order for [place] to release information regarding their care.   Consent: Patient/Guardian gives verbal consent for treatment and assignment of benefits for services provided during this visit. Patient/Guardian expressed understanding and agreed to proceed.   Park Pope, MD 01/11/2024, 2:30 PM

## 2024-01-12 ENCOUNTER — Encounter (HOSPITAL_COMMUNITY): Payer: Medicaid Other | Admitting: Student

## 2024-01-25 ENCOUNTER — Ambulatory Visit (INDEPENDENT_AMBULATORY_CARE_PROVIDER_SITE_OTHER): Admitting: Family

## 2024-01-25 ENCOUNTER — Ambulatory Visit (INDEPENDENT_AMBULATORY_CARE_PROVIDER_SITE_OTHER): Payer: Medicaid Other

## 2024-01-25 VITALS — BP 140/90 | HR 90 | Ht 61.0 in | Wt 198.0 lb

## 2024-01-25 DIAGNOSIS — F319 Bipolar disorder, unspecified: Secondary | ICD-10-CM | POA: Diagnosis not present

## 2024-01-25 DIAGNOSIS — F209 Schizophrenia, unspecified: Secondary | ICD-10-CM

## 2024-01-25 DIAGNOSIS — F5105 Insomnia due to other mental disorder: Secondary | ICD-10-CM

## 2024-01-25 DIAGNOSIS — F411 Generalized anxiety disorder: Secondary | ICD-10-CM

## 2024-01-25 MED ORDER — HALOPERIDOL DECANOATE 100 MG/ML IM SOLN
100.0000 mg | Freq: Once | INTRAMUSCULAR | Status: AC
  Administered 2024-01-25: 100 mg via INTRAMUSCULAR

## 2024-01-25 MED ORDER — DIVALPROEX SODIUM ER 500 MG PO TB24
1000.0000 mg | ORAL_TABLET | Freq: Every day | ORAL | 3 refills | Status: DC
Start: 2024-01-25 — End: 2024-02-24

## 2024-01-25 MED ORDER — DOXEPIN HCL 150 MG PO CAPS: 150.0000 mg | ORAL_CAPSULE | Freq: Every day | ORAL | 3 refills | Status: DC

## 2024-01-25 NOTE — Progress Notes (Cosign Needed)
 Pt presents today to be well groomed and very sweet. Abnormal BP was mentioned to Provider of the day. Pt states that she has an increase on Bp medication per PCP.    Pt tolerated injection of haldol given in right upper quadrant. 1 ml 100 mg. No complaints from pt.    JNL, CMA

## 2024-01-25 NOTE — Progress Notes (Cosign Needed Addendum)
 BH MD/PA/NP OP Progress Note  01/25/2024 3:27 PM Anita Hill  MRN:  [id number]  Chief Complaint: Long acting injection clinic- [facility]  HPI: Anita Hill 53 year old African-American female presents to long-acting injection clinic for monthly Haldol decanoate injection 100 mg. Patient was seen and evaluated face to face by this provider. Tene  has a history with bipolar disorder, generalized anxiety disorder, and sleep disturbance.   [name] presented with multiple concerns during this visit as she reports abdominal cramping, lower back pain and sleep disturbance.  Stated due to clinical area by her pharmacy she has not had her medication for the past 3 days.  States she has plans to pick up her medications today.  [name] is currently prescribed Depakote 1000 mg daily, doxepin 150 mg at bedtime and hydroxyzine for mood stabilization.  Reports she has been taking and tolerating medications well.  No concerns related to suicidal or homicidal ideations.  AIMS 0, patient denied concerns with restlessness and increased anxiety.  Denies auditory or visual hallucinations.  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 Discussed with patient following up with outpatient provider due Gynecological concerns.    [name] sitting; she is alert/oriented x 3; calm/cooperative; and mood congruent with affect.  Patient is speaking in a clear tone at moderate volume, and normal pace; with good eye contact.  Her thought process is coherent and relevant.   [name] doesn't appear to be  responding to internal/external stimuli or experiencing delusional thought content.  Patient denies suicidal/self-harm/homicidal ideation, psychosis, and paranoia.  Patient has remained calm throughout assessment and has answered questions appropriately.     Visit Diagnosis:    ICD-10-CM   1. Schizophrenia, unspecified type (HCC)  F20.9      2. Generalized anxiety disorder  F41.1       Past Psychiatric History:   Past Medical History:  Past Medical History:  Diagnosis Date   BIPOLAR DISORDER    Chest pain 01/20/2016   Delusions (HCC)    Depression    Dyspnea 10/13/2012   Healthcare maintenance 11/11/2021   Heart murmur    "MILD"   Homelessness    Left ventricular hypertrophy    Palpitations    Psychosis (HCC)    Sickle cell trait (HCC)    Substance abuse (HCC)    Weight gain 11/11/2021    Past Surgical History:  Procedure Laterality Date   BUNIONECTOMY     SKIN BIOPSY      Family Psychiatric History:   Family History:  Family History  Problem Relation Age of Onset   Heart disease Mother        CHF   Prostate cancer Father    Colon polyps Neg Hx    Colon cancer Neg Hx    Crohn's disease Neg Hx    Esophageal cancer Neg Hx    Rectal cancer Neg Hx    Stomach cancer Neg Hx    Ulcerative colitis Neg Hx     Social History:  Social History   Socioeconomic History   Marital status: Single    Spouse name: Not on file   Number of children: 2   Years of education: Not on file   Highest education level: Not on file  Occupational History    Comment: Unemployed  Tobacco Use   Smoking status: Every Day    Current packs/day: 1.00    Average packs/day: 1  pack/day for 36.2 years (36.2 ttl pk-yrs)    Types: Cigarettes, Cigars    Start date: 11/02/1987    Passive exposure: Never   Smokeless tobacco: Never   Tobacco comments:    Still smokes one pack of cigars per day.  States still trying to cut back.   Vaping Use   Vaping status: Never Used  Substance and Sexual Activity   Alcohol use: Yes    Comment: everyday 20-80oz   Drug use: Not Currently    Types: Cocaine    Comment: Previous cocaine, heroin, marijuana,CLEAN 21 YEARS UPDATED 09/06/22   Sexual activity: Not Currently  Other Topics Concern   Not on file  Social History Narrative   Not on file   Social Drivers of Health   Financial  Resource Strain: Low Risk  (07/15/2021)   Overall Financial Resource Strain (CARDIA)    Difficulty of Paying Living Expenses: Not hard at all  Food Insecurity: No Food Insecurity (07/15/2021)   Hunger Vital Sign    Worried About Running Out of Food in the Last Year: Never true    Ran Out of Food in the Last Year: Never true  Transportation Needs: No Transportation Needs (07/15/2021)   PRAPARE - Administrator, Civil Service (Medical): No    Lack of Transportation (Non-Medical): No  Physical Activity: Not on file  Stress: Not on file  Social Connections: Not on file    Allergies:  Allergies  Allergen Reactions   Strawberry Extract Anaphylaxis   Risperidone And Related Hives, Nausea Only and Other (See Comments)    Made me feel jittery and restless/ Pt denies this allergy on 01/27/21   Latuda [Lurasidone Hcl] Other (See Comments)    Hot and cold flashes    Metabolic Disorder Labs: Lab Results  Component Value Date   HGBA1C 5.8 12/06/2023   MPG 125.5 05/08/2021   Lab Results  Component Value Date   PROLACTIN 11.8 07/13/2023   PROLACTIN 2.6 (L) 05/08/2021   Lab Results  Component Value Date   CHOL 221 (H) 07/13/2023   TRIG 162 (H) 07/13/2023   HDL 68 07/13/2023   CHOLHDL 3.3 07/13/2023   VLDL 13 05/08/2021   LDLCALC 125 (H) 07/13/2023   LDLCALC 104 (H) 03/22/2022   Lab Results  Component Value Date   TSH 4.210 07/13/2023   TSH 3.290 03/22/2022    Therapeutic Level Labs: No results found for: "LITHIUM" Lab Results  Component Value Date   VALPROATE 83 09/20/2023   VALPROATE <4 (L) 07/13/2023   Lab Results  Component Value Date   CBMZ <2.0 (L) 01/08/2008    Current Medications: Current Outpatient Medications  Medication Sig Dispense Refill   amLODipine (NORVASC) 5 MG tablet TAKE 1 TABLET(5 MG) BY MOUTH AT BEDTIME 90 tablet 3   divalproex (DEPAKOTE ER) 500 MG 24 hr tablet Take 2 tablets (1,000 mg total) by mouth daily at 10 pm. 60 tablet 3    doxepin (SINEQUAN) 150 MG capsule Take 1 capsule (150 mg total) by mouth at bedtime. 30 capsule 3   haloperidol decanoate (HALDOL DECANOATE) 100 MG/ML injection Inject 1 mL (100 mg total) into the muscle every 30 (thirty) days. 1 mL 3   hydrOXYzine (VISTARIL) 25 MG capsule Take 1 capsule (25 mg total) by mouth 3 (three) times daily as needed. 60 capsule 3   metFORMIN (GLUCOPHAGE) 500 MG tablet Take 1 tablet (500 mg total) by mouth daily with breakfast. 30 tablet 2   nicotine (NICODERM  CQ - DOSED IN MG/24 HOURS) 21 mg/24hr patch Place 1 patch (21 mg total) onto the skin every morning. 30 patch 3   nicotine polacrilex (NICORETTE) 4 MG gum Take 1 each (4 mg total) by mouth as needed for smoking cessation. 100 tablet 0   Semaglutide-Weight Management (WEGOVY) 0.25 MG/0.5ML SOAJ Inject 0.25 mg into the skin once a week. Contact doctor after 1 month of use to discuss increasing dose. 2 mL 0   No current facility-administered medications for this visit.     Musculoskeletal: Strength & Muscle Tone: within normal limits Gait & Station: normal Patient leans: N/A  Psychiatric Specialty Exam: Review of Systems  Psychiatric/Behavioral:  Negative for decreased concentration and suicidal ideas. The patient is nervous/anxious.   All other systems reviewed and are negative.   There were no vitals taken for this visit.There is no height or weight on file to calculate BMI.  General Appearance: Casual  Eye Contact:  Good  Speech:  Clear and Coherent  Volume:  Normal  Mood:  Anxious and Depressed  Affect:  Congruent  Thought Process:  Coherent  Orientation:  Full (Time, Place, and Person)  Thought Content: Logical   Suicidal Thoughts:  No  Homicidal Thoughts:  No  Memory:  Immediate;   Good Recent;   Good  Judgement:  Good  Insight:  Good  Psychomotor Activity:  Normal  Concentration:  Concentration: Good  Recall:  Good  Fund of Knowledge: Good  Language: Good  Akathisia:  No  Handed:  Right   AIMS (if indicated): done  Assets:  Communication Skills Desire for Improvement  ADL's:  Intact  Cognition: WNL  Sleep:  Fair   Screenings: AIMS    Flowsheet Row Office Visit from 02/08/2023 in [facility] Office Visit from 09/02/2022 in [facility] Office Visit from 04/08/2022 in [facility] Office Visit from 02/09/2022 in [facility] Admission (Discharged[facility] from [facility]  AIMS Total Score 5 3 0 0 0      AUDIT    Flowsheet Row Office Visit from 05/12/2023 in [facility] Admission (Discharged) from 05/09/[facility]21 [facility]  Alcohol Use Disorder Identification Test Final Score (AUDIT) 10 5      GAD-7    Flowsheet Row Office Visit from 05/12/2023 in [facility] Office Visit from 02/08/2023 in [facility] Office Visit from 12/02/2022 in [facility] Office Visit from 09/02/2022 in [facility] Office Visit from 04/08/2022 in [facility]  Total GAD-7 Score 6 4 9 3  0      PHQ2-9    Flowsheet Row Office Visit from 09/20/2023 in [facility] - A Dept Of [place]. [facility] Office Visit from 07/12/2023 in [facility] - A Dept Of [place]. [facility] Office Visit from 05/12/2023 in [facility] Office Visit from 02/08/2023 in [facility] Office Visit from 12/30/2022 in [facility] - [facility] Dept Of [place]. [facility]  PHQ-2 Total Score 0 1 1 1 2   PHQ-9 Total Score 7 10 12 9 11       Flowsheet Row Office Visit from 04/08/2022 in [facility] from 11[facility]17/2021 [facility] from 07/01/2021 in [facility] Emergency Department at [facility]  C-SSRS RISK CATEGORY No Risk No Risk No Risk        Assessment and Plan:  Follow-up 28 days for Haloperidol decanoate 100 mg  Reordered Depakote 1000 mg p.o. nightly  -patient to follow-up for  valproic acid level.   Reorder doxepin 150 mg nightly and long-acting injectable 100 mg -Discussed following up with primary care and/or gynecology for reported abdominal cramping and back pain issues.  Patient was receptive to plan  Collaboration of Care: Collaboration of Care: Medication Management AEB medications refilled  Patient/Guardian was advised Release of Information must be obtained prior to any record release in order to collaborate their care with an outside provider. Patient/Guardian was advised if they have not already done so to contact the registration department to sign all necessary forms in order for [place] to release information regarding their care.   Consent: Patient/Guardian gives verbal consent for treatment and assignment of benefits for services provided during this visit. Patient/Guardian expressed understanding and agreed to proceed.    Oneta Rack, NP 01/25/2024, 3:27 PM

## 2024-01-30 ENCOUNTER — Ambulatory Visit: Payer: Medicaid Other | Admitting: Family Medicine

## 2024-02-07 ENCOUNTER — Encounter: Payer: Self-pay | Admitting: Family Medicine

## 2024-02-07 ENCOUNTER — Ambulatory Visit (INDEPENDENT_AMBULATORY_CARE_PROVIDER_SITE_OTHER): Payer: Self-pay | Admitting: Family Medicine

## 2024-02-07 VITALS — BP 110/90 | HR 90 | Ht 61.0 in | Wt 215.4 lb

## 2024-02-07 DIAGNOSIS — R1030 Lower abdominal pain, unspecified: Secondary | ICD-10-CM

## 2024-02-07 DIAGNOSIS — Z0000 Encounter for general adult medical examination without abnormal findings: Secondary | ICD-10-CM

## 2024-02-07 DIAGNOSIS — N939 Abnormal uterine and vaginal bleeding, unspecified: Secondary | ICD-10-CM

## 2024-02-07 DIAGNOSIS — Z122 Encounter for screening for malignant neoplasm of respiratory organs: Secondary | ICD-10-CM | POA: Diagnosis not present

## 2024-02-07 DIAGNOSIS — Z1231 Encounter for screening mammogram for malignant neoplasm of breast: Secondary | ICD-10-CM

## 2024-02-07 DIAGNOSIS — Z6841 Body Mass Index (BMI) 40.0 and over, adult: Secondary | ICD-10-CM

## 2024-02-07 DIAGNOSIS — F29 Unspecified psychosis not due to a substance or known physiological condition: Secondary | ICD-10-CM | POA: Diagnosis not present

## 2024-02-07 DIAGNOSIS — I10 Essential (primary) hypertension: Secondary | ICD-10-CM

## 2024-02-07 MED ORDER — ZOSTER VAC RECOMB ADJUVANTED 50 MCG/0.5ML IM SUSR: 0.5000 mL | Freq: Once | INTRAMUSCULAR | 0 refills | Status: AC

## 2024-02-07 NOTE — Patient Instructions (Addendum)
 I have ordered a transvaginal ultrasound to further evaluate your uterus.  We will get this scheduled for you.  Be sure to go to the breast center to schedule your mammogram; I have given you a paper to schedule this.  I collected labs today.  I will follow-up results with you.  I will work on [employer] prescription.  Lets hold off on starting this until we figure out the cause of your abdominal cramps and vaginal bleeding.  I am so happy that your rash is improved!  Let me know if you have any questions, and I will be sure to follow-up results with you.

## 2024-02-07 NOTE — Progress Notes (Signed)
 SUBJECTIVE:   CHIEF COMPLAINT / HPI:   Abdominal cramping Started 2 months ago. It occurred intermittently. She has tried to diet again as below to low FODMAP diet. It typically now occurs around the first of the month and lasts for 3 days. It is concentrated in the lower abdomen. She has also been having some light vaginal bleeding, though she has not had a true menstrual period in 3 years. No recent sexual partners. No fevers. She does have nausea and vomiting without blood during these 3 days.  Rash Previously seen for this and felt to be venous stasis versus capillaritis versus vasculitis.  She has been trying compression stockings and frequent leg elevations.  She is on amlodipine. This rash has resolved with raising the legs.  Weight loss Attempted to initiate Wegovy at last visit, but this was denied. On interview today, she states she has tried to lose weight 7 times in her lifetime without success. She has tried working out more consistently by buying a Photographer, changing her diet for more vegetables and fruits, fasting intermittently, and other combinations of weight loss techniques. Each of these attempts lasted about 1 year without meaningful results. She is committed to attempting these weight loss strategies for an 8th time with the aid of Wegovy.  Bipolar disorder Recent follow-up with psychiatrist on 01/25/2024 for long-acting injectable haloperidol.  She endorsed abdominal cramping and back pain as above.  Appears to be controlled from a psychiatric perspective.  PERTINENT  PMH / PSH: Hypertension, CHF, tardive dyskinesia, tobacco use, schizophrenia, bipolar disorder, BMI 40  OBJECTIVE:   BP (!) 110/90   Pulse 90   Ht 5\' 1"  (1.549 m)   Wt 215 lb 6 oz (97.7 kg)   SpO2 99%   BMI 40.69 kg/m   General: Alert and oriented, in NAD Skin: Warm, dry, and intact without lesions HEENT: NCAT, EOM grossly normal, midline nasal septum Cardiac: RRR, no m/r/g  appreciated Respiratory: CTAB, breathing and speaking comfortably on RA Abdominal: Soft, nontender, nondistended, normoactive bowel sounds on my exam today Extremities: Moves all extremities grossly equally Neurological: No gross focal deficit Psychiatric: Baseline appropriate mood and affect  ASSESSMENT/PLAN:   Assessment & Plan Vaginal bleeding Atypical in this patient who has been without a menstrual period for 3 years.  Cyclical nature could be indicative of endometrial hyperplasia versus endometriosis with pain as below.  Will collect CBC, CMP, TSH, and transvaginal ultrasound for initial evaluation.  Patient is adamant she has not had sex and cannot be pregnant.  Reassuringly without signs of hemodynamic instability today. Lower abdominal pain Reassuringly without pain today on my exam.  Feel could be in relation to monthly vaginal bleeding that patient is experiencing versus other abdominal pathology, though will collect CBC as well as CMP today for further evaluation.  Can also consider endometriosis with the cyclical nature.  Begin workup as above. BMI 40.0-44.9, adult [facility]) Patient unfortunately up 17 pounds since last visit.  Psychiatric medications likely contributing.  Patient has demonstrated multiple attempts at weight loss with both diet and exercise changes over the years without meaningful improvement (see above for more information).  She is committed to trying again with the aid of Wegovy.  Will reach out to pharmacy technician to help get this approved. Schizophrenia spectrum disorder with psychotic disorder type not yet determined [facility]) Continue following with psychiatry for this and bipolar 1 disorder. Primary hypertension Continues to be elevated on amlodipine 10 mg daily.  Pain could  be contributing.  Reassuringly hemodynamically stable without other symptoms today on exam.  Follow-up at next visit. Healthcare maintenance Lung cancer screening, mammogram scheduled.  Shingles vaccine given today.   Anita Kenning, MD [facility]

## 2024-02-08 ENCOUNTER — Other Ambulatory Visit

## 2024-02-08 ENCOUNTER — Ambulatory Visit
Admission: RE | Admit: 2024-02-08 | Discharge: 2024-02-08 | Disposition: A | Discharge status: Home / Self Care | Source: Ambulatory Visit | Attending: Family Medicine | Admitting: Family Medicine

## 2024-02-08 ENCOUNTER — Encounter: Payer: Self-pay | Admitting: Family Medicine

## 2024-02-08 DIAGNOSIS — N939 Abnormal uterine and vaginal bleeding, unspecified: Secondary | ICD-10-CM

## 2024-02-08 LAB — COMPREHENSIVE METABOLIC PANEL WITH GFR
ALT: 11 IU/L (ref 0–32)
AST: 13 IU/L (ref 0–40)
Albumin: 4.6 g/dL (ref 3.8–4.9)
Alkaline Phosphatase: 114 IU/L (ref 44–121)
BUN/Creatinine Ratio: 10 (ref 9–23)
BUN: 8 mg/dL (ref 6–24)
Bilirubin Total: 0.3 mg/dL (ref 0.0–1.2)
CO2: 24 mmol/L (ref 20–29)
Calcium: 10 mg/dL (ref 8.7–10.2)
Chloride: 103 mmol/L (ref 96–106)
Creatinine, Ser: 0.84 mg/dL (ref 0.57–1.00)
Globulin, Total: 2.6 g/dL (ref 1.5–4.5)
Glucose: 89 mg/dL (ref 70–99)
Potassium: 4 mmol/L (ref 3.5–5.2)
Sodium: 142 mmol/L (ref 134–144)
Total Protein: 7.2 g/dL (ref 6.0–8.5)
eGFR: 83 mL/min/{1.73_m2} (ref >59)

## 2024-02-08 LAB — CBC WITH DIFFERENTIAL/PLATELET
Basophils Absolute: 0.1 10*3/uL (ref 0.0–0.2)
Basos: 1 %
EOS (ABSOLUTE): 0.2 10*3/uL (ref 0.0–0.4)
Eos: 2 %
Hematocrit: 46.6 % (ref 34.0–46.6)
Hemoglobin: 15.6 g/dL (ref 11.1–15.9)
Immature Grans (Abs): 0 10*3/uL (ref 0.0–0.1)
Immature Granulocytes: 0 %
Lymphocytes Absolute: 3.1 10*3/uL (ref 0.7–3.1)
Lymphs: 46 %
MCH: 28.5 pg (ref 26.6–33.0)
MCHC: 33.5 g/dL (ref 31.5–35.7)
MCV: 85 fL (ref 79–97)
Monocytes Absolute: 0.5 10*3/uL (ref 0.1–0.9)
Monocytes: 8 %
Neutrophils Absolute: 2.9 10*3/uL (ref 1.4–7.0)
Neutrophils: 43 %
Platelets: 316 10*3/uL (ref 150–450)
RBC: 5.47 x10E6/uL — ABNORMAL HIGH (ref 3.77–5.28)
RDW: 14.2 % (ref 11.7–15.4)
WBC: 6.6 10*3/uL (ref 3.4–10.8)

## 2024-02-08 LAB — TSH RFX ON ABNORMAL TO FREE T4: TSH: 3.74 u[IU]/mL (ref 0.450–4.500)

## 2024-02-08 NOTE — Assessment & Plan Note (Addendum)
 Continues to be elevated on amlodipine 10 mg daily.  Pain could be contributing.  Reassuringly hemodynamically stable without other symptoms today on exam.  Follow-up at next visit.

## 2024-02-08 NOTE — Assessment & Plan Note (Signed)
 Patient unfortunately up 17 pounds since last visit.  Psychiatric medications likely contributing.  Patient has demonstrated multiple attempts at weight loss with both diet and exercise changes over the years without meaningful improvement (see above for more information).  She is committed to trying again with the aid of Wegovy.  Will reach out to pharmacy technician to help get this approved.

## 2024-02-08 NOTE — Assessment & Plan Note (Signed)
 Continue following with psychiatry for this and bipolar 1 disorder.

## 2024-02-09 ENCOUNTER — Other Ambulatory Visit

## 2024-02-10 ENCOUNTER — Telehealth: Payer: Self-pay

## 2024-02-10 ENCOUNTER — Other Ambulatory Visit (HOSPITAL_COMMUNITY): Payer: Self-pay

## 2024-02-10 NOTE — Telephone Encounter (Signed)
 Pharmacy Patient Advocate Encounter   Received notification from CoverMyMeds that prior authorization for [facility] is required/requested.   Insurance verification completed.   The patient is insured through [facility] MEDICAID .   PA required; PA submitted to above mentioned insurance via CoverMyMeds Key/confirmation #/EOC [employer]. Status is pending

## 2024-02-10 NOTE — Telephone Encounter (Signed)
 Pharmacy Patient Advocate Encounter  Received notification from [facility] MEDICAID that Prior Authorization for [facility] 0.25MG  has been APPROVED from 02/10/24 to 08/11/24   PA #/Case ID/Reference #: [id number]

## 2024-02-13 ENCOUNTER — Telehealth: Payer: Self-pay | Admitting: Family Medicine

## 2024-02-13 NOTE — Telephone Encounter (Signed)
 Spoke with patient's daughter Conrado Delay about patient's ultrasound and blood results.  Date of birth confirmed.  Patient currently at school not able to chat.  Discussed normal transvaginal ultrasound, thyroid function, CBC.  Advised to return in 1 month for further follow-up of light vaginal bleeding corresponding with previous menstrual periods.  Consider further evaluation with FSH and LH at that time if persistent.

## 2024-02-21 ENCOUNTER — Telehealth: Payer: Self-pay

## 2024-02-21 NOTE — Telephone Encounter (Signed)
-----   Message from [facility] sent at 02/21/2024  8:42 AM EDT ----- Regarding: need schedule appt for endometrial biopsy Can we call and get this patient scheduled for an endometrial biopsy?  I had previously contacted her daughter about the results of her ultrasound.  However, because patient continues to have the symptoms at her age, would recommend she have a biopsy done for further evaluation in case there are lesions that we cannot see on the ultrasound. ----- Message ----- From: Dema Filler, MD Sent: 02/20/2024   9:47 AM EDT To: Dema Filler, MD  Call and set up endometrial biopsy.

## 2024-02-21 NOTE — Telephone Encounter (Signed)
 Attempted to call patient/patient's legal guardian to schedule an appointment for colpo for an Endometrial Biopsy.  Patient/patient's guardian didn't answer and was unable to leave VM due to the VM box isnt set up.  Christ Courier, CMA

## 2024-02-22 ENCOUNTER — Encounter (HOSPITAL_COMMUNITY): Payer: Self-pay

## 2024-02-22 ENCOUNTER — Ambulatory Visit (INDEPENDENT_AMBULATORY_CARE_PROVIDER_SITE_OTHER): Admitting: *Deleted

## 2024-02-22 ENCOUNTER — Telehealth: Payer: Self-pay

## 2024-02-22 VITALS — BP 127/86 | HR 82 | Resp 16 | Ht 61.0 in | Wt 206.0 lb

## 2024-02-22 DIAGNOSIS — F209 Schizophrenia, unspecified: Secondary | ICD-10-CM | POA: Diagnosis not present

## 2024-02-22 MED ORDER — HALOPERIDOL DECANOATE 100 MG/ML IM SOLN
100.0000 mg | Freq: Once | INTRAMUSCULAR | Status: AC
  Administered 2024-02-22: 100 mg via INTRAMUSCULAR

## 2024-02-22 NOTE — Progress Notes (Cosign Needed)
 Patient arrived for her Haldol  Decanoate injection of 100mg  that she supplied from her pharmacy. States that she has been needing it a while because she was having trouble concentrating in school and getting her work done. She is currently in school to receive her GED. Patient with appropriate mood and affect. Denies SI/HI or AV hallucinations. Pleasant and cooperative. Injection prepared as ordered and given in Left Upper Outer Quadrant. No issue or complaint.

## 2024-02-22 NOTE — Telephone Encounter (Signed)
 Called and spoke with patient's daughter, Conrado Delay to schedule an appointment in [facility] (for patient) for an endometrial biopsy per Dr. Fredrik Jensen request.  Patient's daughter wasn't able to bring her tomorrow - 02/23/2024  Was able to schedule for 03/29/2024 @ 11:20am  Christ Courier, CMA

## 2024-02-22 NOTE — Telephone Encounter (Signed)
-----   Message from [facility] sent at 02/21/2024  8:42 AM EDT ----- Regarding: need schedule appt for endometrial biopsy Can we call and get this patient scheduled for an endometrial biopsy?  I had previously contacted her daughter about the results of her ultrasound.  However, because patient continues to have the symptoms at her age, would recommend she have a biopsy done for further evaluation in case there are lesions that we cannot see on the ultrasound. ----- Message ----- From: Dema Filler, MD Sent: 02/20/2024   9:47 AM EDT To: Dema Filler, MD  Call and set up endometrial biopsy.

## 2024-02-23 ENCOUNTER — Other Ambulatory Visit (HOSPITAL_COMMUNITY): Payer: Self-pay | Admitting: Psychiatry

## 2024-02-23 DIAGNOSIS — F411 Generalized anxiety disorder: Secondary | ICD-10-CM

## 2024-02-23 DIAGNOSIS — F209 Schizophrenia, unspecified: Secondary | ICD-10-CM

## 2024-02-23 DIAGNOSIS — F17219 Nicotine dependence, cigarettes, with unspecified nicotine-induced disorders: Secondary | ICD-10-CM

## 2024-03-19 ENCOUNTER — Ambulatory Visit: Admitting: Family Medicine

## 2024-03-19 ENCOUNTER — Encounter: Payer: Self-pay | Admitting: Family Medicine

## 2024-03-19 VITALS — BP 124/91 | HR 90 | Ht 62.0 in | Wt 215.4 lb

## 2024-03-19 DIAGNOSIS — N939 Abnormal uterine and vaginal bleeding, unspecified: Secondary | ICD-10-CM | POA: Diagnosis not present

## 2024-03-19 DIAGNOSIS — R7303 Prediabetes: Secondary | ICD-10-CM

## 2024-03-19 DIAGNOSIS — M7989 Other specified soft tissue disorders: Secondary | ICD-10-CM | POA: Diagnosis not present

## 2024-03-19 DIAGNOSIS — Z23 Encounter for immunization: Secondary | ICD-10-CM | POA: Diagnosis not present

## 2024-03-19 DIAGNOSIS — Z0000 Encounter for general adult medical examination without abnormal findings: Secondary | ICD-10-CM

## 2024-03-19 LAB — POCT GLYCOSYLATED HEMOGLOBIN (HGB A1C): HbA1c, POC (prediabetic range): 5.9 % (ref 5.7–6.4)

## 2024-03-19 NOTE — Assessment & Plan Note (Signed)
 A1c stable today at 5.9.  Continue Wegovy .

## 2024-03-19 NOTE — Progress Notes (Signed)
    SUBJECTIVE:   CHIEF COMPLAINT / HPI:   Vaginal bleeding Discussed at last visit after endorsing having light vaginal bleeding when she had not had a period in 3 years.  TSH and hemoglobin reassuringly normal.  TVUS normal.  However, since this has continued, she is scheduled for endometrial biopsy on 03/29/2024.  Weight loss, elevated BMI Recently approved for Wegovy  0.25 mg weekly. Was a little sick the first dose of Wegovy , but she has taken 2 other doses without any problem. She feels we could go up in dosing to help with weight loss.  Leg swelling This has continued.  She is unfortunately not been able to utilize her compression stockings or been able to keep the legs elevated much.  PERTINENT  PMH / PSH: Bipolar 1 disorder followed by psychiatry  OBJECTIVE:   BP (!) 124/91   Pulse 90   Ht 5\' 2"  (1.575 m)   Wt 215 lb 6.4 oz (97.7 kg)   SpO2 99%   BMI 39.40 kg/m   General: Alert and oriented, in NAD Skin: Warm, dry, and intact without lesions HEENT: NCAT, EOM grossly normal, midline nasal septum Cardiac: RRR, no m/r/g appreciated, trace pitting edema bilateral lower extremities without increased erythema or tenderness Respiratory: CTAB, breathing and speaking comfortably on RA Abdominal: Soft, nontender, nondistended, normoactive bowel sounds Extremities: Moves all extremities grossly equally Neurological: No gross focal deficit Psychiatric: Appropriate mood and affect  ASSESSMENT/PLAN:   Assessment & Plan Prediabetes A1c stable today at 5.9.  Continue Wegovy . Leg swelling Recommended consistently using compression stockings and keeping legs elevated.  Swelling is trace pitting on my exam today.  While likely due to venous stasis, she does have a documented history of CHF (though previous echo was LVEF 60 to 65% without mention of diastolic dysfunction).  Will update echo today. Vaginal bleeding Reassuring no further episodes.  She is scheduled for endometrial biopsy  this month. Healthcare maintenance She received her pneumococcal vaccination today without incident.   Anita Kenning, MD [facility]

## 2024-03-19 NOTE — Assessment & Plan Note (Signed)
 She received her pneumococcal vaccination today without incident.

## 2024-03-19 NOTE — Patient Instructions (Signed)
 You are scheduled for an endometrial biopsy on 03/29/2024 because you have had this vaginal bleeding.  Continue taking Wegovy  0.25 mg weekly.  Let me know when you ready for refills through MyChart.  For your leg swelling, try to wear the compression stockings daily.  Also keep your legs elevated as much as possible.  I have also added a echocardiogram to further evaluate for your heart function since this can sometimes cause leg swelling.

## 2024-03-19 NOTE — Assessment & Plan Note (Signed)
 Reassuring no further episodes.  She is scheduled for endometrial biopsy this month.

## 2024-03-19 NOTE — Assessment & Plan Note (Signed)
 Recommended consistently using compression stockings and keeping legs elevated.  Swelling is trace pitting on my exam today.  While likely due to venous stasis, she does have a documented history of CHF (though previous echo was LVEF 60 to 65% without mention of diastolic dysfunction).  Will update echo today.

## 2024-03-20 ENCOUNTER — Ambulatory Visit
Admission: RE | Admit: 2024-03-20 | Discharge: 2024-03-20 | Disposition: A | Discharge status: Home / Self Care | Source: Ambulatory Visit | Attending: Family Medicine | Admitting: Family Medicine

## 2024-03-20 DIAGNOSIS — Z1231 Encounter for screening mammogram for malignant neoplasm of breast: Secondary | ICD-10-CM

## 2024-03-21 ENCOUNTER — Ambulatory Visit (HOSPITAL_COMMUNITY)

## 2024-03-21 VITALS — BP 120/84 | HR 90 | Ht 62.0 in | Wt 207.5 lb

## 2024-03-21 DIAGNOSIS — F29 Unspecified psychosis not due to a substance or known physiological condition: Secondary | ICD-10-CM | POA: Diagnosis not present

## 2024-03-21 MED ORDER — HALOPERIDOL DECANOATE 100 MG/ML IM SOLN
100.0000 mg | Freq: Once | INTRAMUSCULAR | Status: AC
  Administered 2024-03-21: 100 mg via INTRAMUSCULAR

## 2024-03-21 NOTE — Progress Notes (Cosign Needed Addendum)
 Pt presents today to be well groomed and very sweet. Abnormal BP was mentioned to Provider of the day. Pt states that she has an increase on Bp medication per PCP.    Pt tolerated injection of haldol given in right upper quadrant. 1 ml 100 mg. No complaints from pt.    JNL, CMA

## 2024-03-22 ENCOUNTER — Other Ambulatory Visit: Payer: Self-pay

## 2024-03-22 ENCOUNTER — Other Ambulatory Visit: Payer: Self-pay | Admitting: Family Medicine

## 2024-03-22 DIAGNOSIS — E8881 Metabolic syndrome: Secondary | ICD-10-CM

## 2024-03-22 DIAGNOSIS — Z6841 Body Mass Index (BMI) 40.0 and over, adult: Secondary | ICD-10-CM

## 2024-03-22 MED ORDER — SEMAGLUTIDE(0.25 OR 0.5MG/DOS) 2 MG/3ML ~~LOC~~ SOPN: 0.5000 mg | PEN_INJECTOR | SUBCUTANEOUS | 0 refills | Status: DC

## 2024-03-23 ENCOUNTER — Telehealth: Payer: Self-pay

## 2024-03-23 MED ORDER — SEMAGLUTIDE-WEIGHT MANAGEMENT 0.5 MG/0.5ML ~~LOC~~ SOAJ: 0.5000 mg | SUBCUTANEOUS | 0 refills | Status: DC

## 2024-03-23 NOTE — Telephone Encounter (Signed)
 Wegovy  ordered. Thanks!

## 2024-03-23 NOTE — Telephone Encounter (Signed)
 PA rec'd for Ozempic .   Patient is on Wegovy . Please resend rx to pharmacy for brand Wegovy .

## 2024-03-26 ENCOUNTER — Other Ambulatory Visit: Payer: Self-pay | Admitting: Family Medicine

## 2024-03-26 DIAGNOSIS — E669 Obesity, unspecified: Secondary | ICD-10-CM

## 2024-03-26 DIAGNOSIS — Z6841 Body Mass Index (BMI) 40.0 and over, adult: Secondary | ICD-10-CM

## 2024-03-29 ENCOUNTER — Ambulatory Visit

## 2024-03-30 ENCOUNTER — Encounter: Payer: Self-pay | Admitting: Family Medicine

## 2024-03-30 MED ORDER — SEMAGLUTIDE-WEIGHT MANAGEMENT 0.5 MG/0.5ML ~~LOC~~ SOAJ: 0.5000 mg | SUBCUTANEOUS | 0 refills | Status: DC

## 2024-04-02 ENCOUNTER — Ambulatory Visit (HOSPITAL_COMMUNITY)
Admission: RE | Admit: 2024-04-02 | Discharge: 2024-04-02 | Disposition: A | Discharge status: Home / Self Care | Source: Ambulatory Visit | Attending: Family Medicine | Admitting: Family Medicine

## 2024-04-02 DIAGNOSIS — Z122 Encounter for screening for malignant neoplasm of respiratory organs: Secondary | ICD-10-CM | POA: Insufficient documentation

## 2024-04-17 ENCOUNTER — Telehealth: Payer: Self-pay

## 2024-04-17 NOTE — Telephone Encounter (Signed)
 Received call from [facility] with call report regarding CT Lung Screening.   Please see below.   IMPRESSION: Lung-RADS 1, negative. Continue annual screening with low-dose chest CT without contrast in 12 months.   Mild bilateral upper lobe peribronchovascular patchy ground-glass. Considerations include minimal pulmonary edema or atypical infection.  Forwarding to PCP.   Elsie Halo, RN

## 2024-04-18 ENCOUNTER — Ambulatory Visit (INDEPENDENT_AMBULATORY_CARE_PROVIDER_SITE_OTHER)

## 2024-04-18 ENCOUNTER — Ambulatory Visit: Payer: Self-pay | Admitting: Family Medicine

## 2024-04-18 ENCOUNTER — Ambulatory Visit (INDEPENDENT_AMBULATORY_CARE_PROVIDER_SITE_OTHER): Admitting: Family

## 2024-04-18 ENCOUNTER — Encounter (HOSPITAL_COMMUNITY): Payer: Self-pay | Admitting: Family

## 2024-04-18 VITALS — BP 130/87 | HR 90 | Temp 98.6°F | Ht 62.0 in | Wt 212.0 lb

## 2024-04-18 DIAGNOSIS — F209 Schizophrenia, unspecified: Secondary | ICD-10-CM | POA: Diagnosis not present

## 2024-04-18 DIAGNOSIS — F330 Major depressive disorder, recurrent, mild: Secondary | ICD-10-CM | POA: Diagnosis not present

## 2024-04-18 DIAGNOSIS — J439 Emphysema, unspecified: Secondary | ICD-10-CM

## 2024-04-18 DIAGNOSIS — F411 Generalized anxiety disorder: Secondary | ICD-10-CM | POA: Diagnosis not present

## 2024-04-18 DIAGNOSIS — F29 Unspecified psychosis not due to a substance or known physiological condition: Secondary | ICD-10-CM | POA: Diagnosis not present

## 2024-04-18 MED ORDER — HALOPERIDOL DECANOATE 100 MG/ML IM SOLN
100.0000 mg | Freq: Once | INTRAMUSCULAR | Status: AC
  Administered 2024-05-16: 100 mg via INTRAMUSCULAR

## 2024-04-18 MED ORDER — HALOPERIDOL DECANOATE 100 MG/ML IM SOLN
100.0000 mg | Freq: Once | INTRAMUSCULAR | Status: AC
  Administered 2024-04-18: 100 mg via INTRAMUSCULAR

## 2024-04-18 NOTE — Progress Notes (Signed)
 Reviewed low-dose CT lung cancer screening for patient which demonstrated negative results for malignancy.  Scan also demonstrated mild bilateral upper lobe peribronchovascular patchy groundglass with considerations of minimal pulmonary edema versus atypical infection.  Emphysema was also appreciated.  Patient reassuringly without pulmonary symptoms on previous exam.  We are already in the process of updating her echocardiogram for her leg swelling.  This echocardiogram is scheduled for May 03, 2024.  Sent patient MyChart message explaining that we will proceed with echocardiogram for further evaluation of pulmonary edema (though this would unlikely be in the bilateral upper lobes) and informed her to let me know if she has any pulmonary symptoms or signs of infection before then.

## 2024-04-18 NOTE — Progress Notes (Cosign Needed)
 Pt presents today to be well groomed and very sweet. Abnormal BP was mentioned to Provider of the day. Pt states that she has had an increase on Bp medication per PCP.      Pt tolerated injection of haldol  given in LEFT-upper quadrant. 1 ml 100 mg. No complaints from pt.      JNL, CMA

## 2024-04-18 NOTE — Progress Notes (Signed)
 BH MD/PA/NP OP Progress Note  04/18/2024 3:13 PM Anita Hill  MRN:  [id number]  Chief Complaint: Long-acting injection clinic  HPI: Anita Hill 53 year old African-American female presents to long-acting injection clinic for monthly injection.  Carries a diagnosis related to schizophrenia unspecified, bipolar disorder, major depressive disorder and generalized anxiety disorder.  Chart review she is currently prescribed Depakote  500 mg p.o. twice daily doxepin  150 mg nightly hydroxyzine  25 mg p.o. 3 times daily and Haldol  decanoate 100 mg every 28 days.  She reports she has been taking and tolerating medications well.    Anita Hill reports some concerns related to sleep disturbance.  States most recently she has been waking up around 3 to 4 AM.  States no issues with going to sleep early morning wakening.  She denies racing thoughts or any recent trauma.  Denied that she has changed her daily routine.  States she is unable to recall the name of the medication that she is taking for sleep.  This provider read patient's medications verbally she denied that any of these medications sounded familiar.  Discussed at follow-up appointment to bring medication pill bottles and.  She was receptive to plan.  No additional concerns noted at this visit.  Symptoms related to restlessness and/or akathisia  AIMS 0 Syretta sitting oriented x 3.  Pleasant, cooperative mood congruent affect.  Speaking in clear tone normal pace moderate volume presents with good eye contact for thought processes clear coherent and relevant.  She does not appear to be responding to internal or external stimuli.  She denied any delusional thoughts content paranoia psychosis or auditory visual hallucinations.  She answered all questions appropriate throughout this process  Visit Diagnosis:    ICD-10-CM   1. Schizophrenia, unspecified type (HCC)  F20.9     2. Generalized anxiety disorder  F41.1     3. Major depressive disorder,  recurrent episode, mild (HCC)  F33.0       Past Psychiatric History:   Past Medical History:  Past Medical History:  Diagnosis Date   BIPOLAR DISORDER    Chest pain 01/20/2016   Delusions (HCC)    Depression    Dyspnea 10/13/2012   Healthcare maintenance 11/11/2021   Heart murmur    MILD   Homelessness    Left ventricular hypertrophy    Palpitations    Psychosis (HCC)    Sickle cell trait (HCC)    Substance abuse (HCC)    Weight gain 11/11/2021    Past Surgical History:  Procedure Laterality Date   BUNIONECTOMY     SKIN BIOPSY      Family Psychiatric History:   Family History:  Family History  Problem Relation Age of Onset   Heart disease Mother        CHF   Prostate cancer Father    Colon polyps Neg Hx    Colon cancer Neg Hx    Crohn's disease Neg Hx    Esophageal cancer Neg Hx    Rectal cancer Neg Hx    Stomach cancer Neg Hx    Ulcerative colitis Neg Hx     Social History:  Social History   Socioeconomic History   Marital status: Single    Spouse name: Not on file   Number of children: 2   Years of education: Not on file   Highest education level: Not on file  Occupational History    Comment: Unemployed  Tobacco Use   Smoking status: Every Day    Current packs/day: 1.00  Average packs/day: 1 pack/day for 36.5 years (36.5 ttl pk-yrs)    Types: Cigarettes, Cigars    Start date: 11/02/1987    Passive exposure: Never   Smokeless tobacco: Never   Tobacco comments:    Still smokes one pack of cigars per day.  States still trying to cut back.   Vaping Use   Vaping status: Never Used  Substance and Sexual Activity   Alcohol use: Yes    Comment: everyday 20-80oz   Drug use: Not Currently    Types: Cocaine    Comment: Previous cocaine, heroin, marijuana,CLEAN 21 YEARS UPDATED 09/06/22   Sexual activity: Not Currently  Other Topics Concern   Not on file  Social History Narrative   Not on file   Social Drivers of Health   Financial Resource  Strain: Low Risk  (07/15/2021)   Overall Financial Resource Strain (CARDIA)    Difficulty of Paying Living Expenses: Not hard at all  Food Insecurity: No Food Insecurity (07/15/2021)   Hunger Vital Sign    Worried About Running Out of Food in the Last Year: Never true    Ran Out of Food in the Last Year: Never true  Transportation Needs: No Transportation Needs (07/15/2021)   PRAPARE - Administrator, Civil Service (Medical): No    Lack of Transportation (Non-Medical): No  Physical Activity: Not on file  Stress: Not on file  Social Connections: Not on file    Allergies:  Allergies  Allergen Reactions   Strawberry Extract Anaphylaxis   Risperidone And Related Hives, Nausea Only and Other (See Comments)    Made me feel jittery and restless/ Pt denies this allergy on 01/27/21   Latuda [Lurasidone Hcl] Other (See Comments)    Hot and cold flashes    Metabolic Disorder Labs: Lab Results  Component Value Date   HGBA1C 5.9 03/19/2024   MPG 125.5 05/08/2021   Lab Results  Component Value Date   PROLACTIN 11.8 07/13/2023   PROLACTIN 2.6 (L) 05/08/2021   Lab Results  Component Value Date   CHOL 221 (H) 07/13/2023   TRIG 162 (H) 07/13/2023   HDL 68 07/13/2023   CHOLHDL 3.3 07/13/2023   VLDL 13 05/08/2021   LDLCALC 125 (H) 07/13/2023   LDLCALC 104 (H) 03/22/2022   Lab Results  Component Value Date   TSH 3.740 02/07/2024   TSH 4.210 07/13/2023    Therapeutic Level Labs: No results found for: LITHIUM Lab Results  Component Value Date   VALPROATE 83 09/20/2023   VALPROATE <4 (L) 07/13/2023   Lab Results  Component Value Date   CBMZ <2.0 (L) 01/08/2008    Current Medications: Current Outpatient Medications  Medication Sig Dispense Refill   amLODipine  (NORVASC ) 5 MG tablet TAKE 1 TABLET(5 MG) BY MOUTH AT BEDTIME 90 tablet 3   divalproex  (DEPAKOTE  ER) 500 MG 24 hr tablet TAKE 2 TABLETS BY MOUTH EVERY DAY at 10 p.m. 60 tablet 3   doxepin  (SINEQUAN ) 150 MG  capsule Take 1 capsule (150 mg total) by mouth at bedtime. 30 capsule 3   haloperidol  decanoate (HALDOL  DECANOATE) 100 MG/ML injection Inject 1 mL (100 mg total) into the muscle every 30 (thirty) days. 1 mL 3   hydrOXYzine  (VISTARIL ) 25 MG capsule TAKE 1 CAPSULE BY MOUTH 3 TIMES DAILY AS NEEDED 60 capsule 3   metFORMIN  (GLUCOPHAGE ) 500 MG tablet Take 1 tablet (500 mg total) by mouth daily with breakfast. 30 tablet 2   nicotine  (GNP NICOTINE ) 14 mg/24hr  patch APPLY 1 PATCH TO SKIN EVERY MORNING 28 patch 3   nicotine  (NICODERM CQ  - DOSED IN MG/24 HOURS) 21 mg/24hr patch Place 1 patch (21 mg total) onto the skin every morning. 30 patch 3   nicotine  polacrilex (NICORETTE ) 4 MG gum Take 1 each (4 mg total) by mouth as needed for smoking cessation. 100 tablet 0   Semaglutide -Weight Management 0.5 MG/0.5ML SOAJ Inject 0.5 mg into the skin once a week for 28 days. 2 mL 0   Current Facility-Administered Medications  Medication Dose Route Frequency Provider Last Rate Last Admin   haloperidol  decanoate (HALDOL  DECANOATE) 100 MG/ML injection 100 mg  100 mg Intramuscular Once          Musculoskeletal: Strength & Muscle Tone: within normal limits Gait & Station: normal Patient leans: N/A  Psychiatric Specialty Exam: Review of Systems  There were no vitals taken for this visit.There is no height or weight on file to calculate BMI.  General Appearance: Casual  Eye Contact:  Good  Speech:  Clear and Coherent  Volume:  Normal  Mood:  Anxious and Depressed  Affect:  Congruent  Thought Process:  Coherent  Orientation:  Full (Time, Place, and Person)  Thought Content: Logical   Suicidal Thoughts:  No  Homicidal Thoughts:  No  Memory:  Immediate;   Good Recent;   Good  Judgement:  Good  Insight:  Good  Psychomotor Activity:  Normal  Concentration:  Concentration: Good  Recall:  Good  Fund of Knowledge: Good  Language: Good  Akathisia:  No  Handed:  Right  AIMS (if indicated): done  Assets:   Communication Skills Desire for Improvement Resilience Social Support  ADL's:  Intact  Cognition: WNL  Sleep:  Fair reported night time wakening about 3 and or 4 am   Screenings: AIMS    Flowsheet Row Office Visit from 02/08/2023 in [facility] Office Visit from 09/02/2022 in [facility] Office Visit from 04/08/2022 in [facility] Office Visit from 02/09/2022 in [facility]Dis[facility]  AIMS Total Score 5 3 0 0 0   AUDIT    Flowsheet Row Office Visit from 05/12/2023 in [facility] Admission (Discharged) from [facility]  Alcohol Use Disorder Identification Test Final Score (AUDIT) 10 5   GAD-7    Flowsheet Row Office Visit from 05/12/2023 in [facility] Office Visit from 02/08/2023 in [facility] Office Visit from 12/02/2022 in [facility] Office Visit from 09/02/2022 in [facility] Office Visit from 04/08/2022 in [facility]  Total GAD-7 Score 6 4 9 3  0   PHQ2-9    Flowsheet Row Office Visit from 03/19/2024 in [facility] [place] Office Visit from 02/07/2024 in [facility] [place]. [facility] Office Visit from 09/20/2023 in [facility] [place]. [facility] Office Visit from 07/12/2023 in [facility] [place]. [facility] Office Visit from 05/12/2023 in [facility]  PHQ-2 Total Score 1 1 0 1 1  PHQ-9 Total Score 7 3 7 10 12    Flowsheet  Row Office Visit from 04/08/2022 in [facility]  Counselor from 11/17/2021 in [facility] ED from 07/01/2021 in [facility] Emergency Department at [facility]  C-SSRS RISK CATEGORY No Risk No Risk No Risk     Assessment and Plan:  Continue Haldol  decanoate 100 mg every 28 days Reordered Depakote  1000 mg primary care provider for valproic acid  level EKG CMP CBC. - Continue doxepin  150 mg nightly  Collaboration of Care: Collaboration of Care: Medication Management AEB medications refilled sent to pharmacy  Patient/Guardian was advised Release of Information must be obtained prior to any record release in order to collaborate their care with an outside provider. Patient/Guardian was advised if they have not already done so to contact the registration department to sign all necessary forms in order for us  to release information regarding their care.   Consent: Patient/Guardian gives verbal consent for treatment and assignment of benefits for services provided during this visit. Patient/Guardian expressed understanding and agreed to proceed.    Levester Reagin, NP 04/18/2024, 3:13 PM

## 2024-04-24 DIAGNOSIS — J439 Emphysema, unspecified: Secondary | ICD-10-CM | POA: Insufficient documentation

## 2024-05-03 ENCOUNTER — Ambulatory Visit (HOSPITAL_COMMUNITY): Admission: RE | Admit: 2024-05-03 | Source: Ambulatory Visit

## 2024-05-16 ENCOUNTER — Telehealth (HOSPITAL_COMMUNITY): Payer: Self-pay

## 2024-05-16 ENCOUNTER — Ambulatory Visit (INDEPENDENT_AMBULATORY_CARE_PROVIDER_SITE_OTHER)

## 2024-05-16 VITALS — BP 138/89 | HR 100 | Ht 62.0 in | Wt 204.2 lb

## 2024-05-16 DIAGNOSIS — F319 Bipolar disorder, unspecified: Secondary | ICD-10-CM | POA: Diagnosis not present

## 2024-05-16 NOTE — Progress Notes (Cosign Needed)
 Pt presents today to be well groomed and very sweet. Abnormal BP was mentioned to Provider of the day.      Pt tolerated injection of haldol  given in RIGHT-upper quadrant. 1 ml 100 mg. No complaints from pt.      JNL, CMA

## 2024-05-16 NOTE — Telephone Encounter (Addendum)
 Hello,   Pt was seen today for nurses visit. Pt was given her haldol  injection. Pt states that she is in need for a refill for her PSYCH meds. Pt was last seen by Tanika on 04/19/2024.  JNL

## 2024-05-17 ENCOUNTER — Other Ambulatory Visit (HOSPITAL_COMMUNITY): Payer: Self-pay | Admitting: Family

## 2024-05-17 DIAGNOSIS — F411 Generalized anxiety disorder: Secondary | ICD-10-CM

## 2024-05-17 DIAGNOSIS — F5105 Insomnia due to other mental disorder: Secondary | ICD-10-CM

## 2024-05-17 DIAGNOSIS — F209 Schizophrenia, unspecified: Secondary | ICD-10-CM

## 2024-05-17 MED ORDER — HALOPERIDOL DECANOATE 100 MG/ML IM SOLN: 100.0000 mg | INTRAMUSCULAR | 3 refills | Status: DC

## 2024-05-17 MED ORDER — DIVALPROEX SODIUM ER 500 MG PO TB24: 1000.0000 mg | ORAL_TABLET | Freq: Every day | ORAL | 2 refills | Status: DC

## 2024-05-17 MED ORDER — DOXEPIN HCL 150 MG PO CAPS: 150.0000 mg | ORAL_CAPSULE | Freq: Every day | ORAL | 3 refills | Status: DC

## 2024-05-25 ENCOUNTER — Encounter: Payer: Self-pay | Admitting: Family Medicine

## 2024-05-28 ENCOUNTER — Ambulatory Visit (HOSPITAL_COMMUNITY)
Admission: RE | Admit: 2024-05-28 | Discharge: 2024-05-28 | Disposition: A | Discharge status: Home / Self Care | Source: Ambulatory Visit | Attending: Family Medicine | Admitting: Family Medicine

## 2024-05-28 DIAGNOSIS — R609 Edema, unspecified: Secondary | ICD-10-CM | POA: Insufficient documentation

## 2024-05-28 DIAGNOSIS — R600 Localized edema: Secondary | ICD-10-CM | POA: Diagnosis not present

## 2024-05-28 DIAGNOSIS — M7989 Other specified soft tissue disorders: Secondary | ICD-10-CM | POA: Diagnosis not present

## 2024-05-28 LAB — ECHOCARDIOGRAM COMPLETE
Area-P 1/2: 3.79 cm2
S' Lateral: 3.11 cm

## 2024-05-29 ENCOUNTER — Encounter: Payer: Self-pay | Admitting: Family Medicine

## 2024-05-29 ENCOUNTER — Ambulatory Visit: Payer: Self-pay | Admitting: Family Medicine

## 2024-06-01 ENCOUNTER — Other Ambulatory Visit: Payer: Self-pay | Admitting: Family Medicine

## 2024-06-09 NOTE — Progress Notes (Signed)
 Contacted patient with echo results given unviewed MyChart message. Advised she has mildly reduced LVEF and G1DD. She reports leg swelling is improved since she has not been walking a lot. She does endorse SOB, however. She would like to come in for evaluation; I have scheduled her with me at 10:10 AM on 8/12. Advised to present to the ED if worsening before then. Patient understanding and appreciative.

## 2024-06-12 ENCOUNTER — Ambulatory Visit (INDEPENDENT_AMBULATORY_CARE_PROVIDER_SITE_OTHER): Payer: Self-pay | Admitting: Family Medicine

## 2024-06-12 ENCOUNTER — Encounter: Payer: Self-pay | Admitting: Family Medicine

## 2024-06-12 VITALS — BP 116/88 | HR 98 | Ht 62.0 in | Wt 210.0 lb

## 2024-06-12 DIAGNOSIS — J439 Emphysema, unspecified: Secondary | ICD-10-CM | POA: Diagnosis not present

## 2024-06-12 DIAGNOSIS — I5022 Chronic systolic (congestive) heart failure: Secondary | ICD-10-CM | POA: Diagnosis present

## 2024-06-12 DIAGNOSIS — Z720 Tobacco use: Secondary | ICD-10-CM | POA: Diagnosis not present

## 2024-06-12 DIAGNOSIS — F1020 Alcohol dependence, uncomplicated: Secondary | ICD-10-CM

## 2024-06-12 MED ORDER — EMPAGLIFLOZIN 10 MG PO TABS: 10.0000 mg | ORAL_TABLET | Freq: Every day | ORAL | 0 refills | Status: AC

## 2024-06-12 MED ORDER — NALTREXONE HCL 50 MG PO TABS: 50.0000 mg | ORAL_TABLET | Freq: Every day | ORAL | 0 refills | Status: DC

## 2024-06-12 NOTE — Assessment & Plan Note (Signed)
 Reassuringly without clinical signs of overload today, though patient feels her legs are more swollen. We will start jardiance  for cardiac protection and mild diuretic effect. Can consider prn lasix  in the future if uncontrolled. Will continue to slowly add GDMT for her with likely a beta blocker at her next visit.

## 2024-06-12 NOTE — Assessment & Plan Note (Signed)
 Continues to smoke despite nicotine  replacement therapy. Advised this could be worsening her SOB and COPD. She does not have any wheezing on exam today and SpO2 is normal. Hope naltrexone  can also aid in quitting.

## 2024-06-12 NOTE — Assessment & Plan Note (Signed)
 No signs of withdrawal today. Overall mild-moderate use. We discussed starting naltrexone  today and trying to slowly decrease her intake. She will look back into getting with AA. She will come back to see me in 2 weeks or sooner if needed.

## 2024-06-12 NOTE — Patient Instructions (Addendum)
 I have sent in naltrexone  for the alcohol use. Try to go down ONE beer per day per week. Come back in 2 weeks.  I have sent in jardiance  to help with heart failure and fluid. Continue avoiding salt, using compression, and elevating the legs.

## 2024-06-12 NOTE — Progress Notes (Signed)
    SUBJECTIVE:   CHIEF COMPLAINT / HPI:   SOB, leg swelling Wants to chat about her echo results. SOB mostly before smoking. No true orthopnea. SOB does not seem to get worse with exertion. Her legs have been overall better in terms of swelling since she has been keeping them up more.  Alcohol, tobacco use Was previously in AA years ago. She currently drinks 3 beers daily and some liquor every so often. She would like resources and help to quit. She also smokes and uses nicotine , but it does not seem to help a lot.  OBJECTIVE:   BP 116/88   Pulse 98   Ht 5' 2 (1.575 m)   Wt 210 lb (95.3 kg)   SpO2 97%   BMI 38.41 kg/m   General: Alert and oriented, in NAD Skin: Warm, dry, and intact HEENT: NCAT, EOM grossly normal, midline nasal septum Cardiac: RRR, no m/r/g appreciated, no significant BLE edema Respiratory: CTAB, breathing and speaking comfortably on RA Extremities: Moves all extremities grossly equally Neurological: No gross focal deficit Psychiatric: Appropriate mood and affect   ASSESSMENT/PLAN:   Assessment & Plan Heart failure with mildly reduced ejection fraction (HFmrEF) (HCC) Reassuringly without clinical signs of overload today, though patient feels her legs are more swollen. We will start jardiance  for cardiac protection and mild diuretic effect. Can consider prn lasix  in the future if uncontrolled. Will continue to slowly add GDMT for her with likely a beta blocker at her next visit. Alcohol use disorder, moderate, dependence (HCC) No signs of withdrawal today. Overall mild-moderate use. We discussed starting naltrexone  today and trying to slowly decrease her intake. She will look back into getting with AA. She will come back to see me in 2 weeks or sooner if needed. Tobacco abuse Pulmonary emphysema, unspecified emphysema type (HCC) Continues to smoke despite nicotine  replacement therapy. Advised this could be worsening her SOB and COPD. She does not have any  wheezing on exam today and SpO2 is normal. Hope naltrexone  can also aid in quitting.   Stuart Redo, MD [facility]

## 2024-06-13 ENCOUNTER — Telehealth: Payer: Self-pay

## 2024-06-13 ENCOUNTER — Ambulatory Visit (INDEPENDENT_AMBULATORY_CARE_PROVIDER_SITE_OTHER)

## 2024-06-13 ENCOUNTER — Encounter (HOSPITAL_COMMUNITY): Payer: Self-pay

## 2024-06-13 ENCOUNTER — Other Ambulatory Visit: Payer: Self-pay

## 2024-06-13 VITALS — BP 117/87 | HR 107 | Ht 62.0 in | Wt 211.4 lb

## 2024-06-13 DIAGNOSIS — F411 Generalized anxiety disorder: Secondary | ICD-10-CM | POA: Diagnosis not present

## 2024-06-13 DIAGNOSIS — F200 Paranoid schizophrenia: Secondary | ICD-10-CM

## 2024-06-13 MED ORDER — HALOPERIDOL DECANOATE 100 MG/ML IM SOLN
100.0000 mg | Freq: Once | INTRAMUSCULAR | Status: AC
  Administered 2024-06-13 (×2): 100 mg via INTRAMUSCULAR

## 2024-06-13 NOTE — Telephone Encounter (Signed)
 Pharmacy Patient Advocate Encounter  Received notification from [facility] MEDICAID that Prior Authorization for JARDIANCE  10MG  has been APPROVED from 06/13/24 to 06/13/25   PA #/Case ID/Reference #: [id number]

## 2024-06-13 NOTE — Telephone Encounter (Signed)
 Prior authorization submitted for JARDIANCE  to [facility] [employer] via Latent.   Key: [id number]

## 2024-06-13 NOTE — Progress Notes (Cosign Needed)
 Patient presented to office for LAI of Haldol  100 mg/ml. Patient presented with bright mood and affect. Patient denies any issues at this time. Patient received injection in Left deltoid. Patient tolerated injection well. No complaints noted. Patein will return in 30 days.

## 2024-06-26 ENCOUNTER — Ambulatory Visit: Admitting: Family Medicine

## 2024-07-09 ENCOUNTER — Encounter: Payer: Self-pay | Admitting: Family Medicine

## 2024-07-09 ENCOUNTER — Ambulatory Visit (INDEPENDENT_AMBULATORY_CARE_PROVIDER_SITE_OTHER): Admitting: Family Medicine

## 2024-07-09 VITALS — BP 130/96 | HR 84 | Temp 98.6°F | Ht 60.0 in | Wt 208.0 lb

## 2024-07-09 DIAGNOSIS — Z6841 Body Mass Index (BMI) 40.0 and over, adult: Secondary | ICD-10-CM

## 2024-07-09 DIAGNOSIS — N939 Abnormal uterine and vaginal bleeding, unspecified: Secondary | ICD-10-CM | POA: Diagnosis not present

## 2024-07-09 DIAGNOSIS — Z720 Tobacco use: Secondary | ICD-10-CM | POA: Diagnosis not present

## 2024-07-09 DIAGNOSIS — L608 Other nail disorders: Secondary | ICD-10-CM | POA: Diagnosis not present

## 2024-07-09 DIAGNOSIS — F1020 Alcohol dependence, uncomplicated: Secondary | ICD-10-CM | POA: Diagnosis not present

## 2024-07-09 DIAGNOSIS — I5022 Chronic systolic (congestive) heart failure: Secondary | ICD-10-CM

## 2024-07-09 DIAGNOSIS — I10 Essential (primary) hypertension: Secondary | ICD-10-CM | POA: Diagnosis not present

## 2024-07-09 MED ORDER — CIPROFLOXACIN HCL 0.3 % OP SOLN: OPHTHALMIC | 0 refills | Status: AC

## 2024-07-09 MED ORDER — METOPROLOL TARTRATE 25 MG PO TABS: 25.0000 mg | ORAL_TABLET | Freq: Two times a day (BID) | ORAL | 0 refills | Status: DC

## 2024-07-09 NOTE — Patient Instructions (Addendum)
 We started metoprolol  for your heart failure.  Continue the great work. Be sure to take your blood pressure medicines. Be sure to continue your naltrexone  and working on the tobacco use and alcohol use.  I will send in toenail cream soon.

## 2024-07-09 NOTE — Progress Notes (Unsigned)
    SUBJECTIVE:   CHIEF COMPLAINT / HPI:   HFmrEF Previously started on jardiance . Tolerated this well.  Alcohol use, tobacco use Started on naltrexone  at last visit. She has been looking into AA. She is down to one beer per day. One pack of cigars now lasts her the whole day.  Vaginal bleeding She was not able to come to her appointment for endometrial biopsy in June 2025.  Toenail discoloration Was told she has fungus per her nail tech. She has has pain on it before, but now it is improved.  PERTINENT  PMH / PSH: COPD  OBJECTIVE:   BP (!) 123/93   Pulse 84   Temp 98.6 F (37 C)   Ht 5' (1.524 m)   Wt 208 lb (94.3 kg)   SpO2 100%   BMI 40.62 kg/m   General: Alert and oriented, in NAD Skin: Warm, dry, and intact without lesions HEENT: NCAT, EOM grossly normal, midline nasal septum Cardiac: RRR, no m/r/g appreciated Respiratory: CTAB, breathing and speaking comfortably on RA Abdominal: Soft, nontender, nondistended, normoactive bowel sounds Extremities: Moves all extremities grossly equally Neurological: No gross focal deficit Psychiatric: Appropriate mood and affect   ASSESSMENT/PLAN:   Assessment & Plan    Stuart Redo, MD [facility]

## 2024-07-10 ENCOUNTER — Encounter: Payer: Self-pay | Admitting: Family Medicine

## 2024-07-10 MED ORDER — WEGOVY 0.5 MG/0.5ML ~~LOC~~ SOAJ: 0.5000 mg | SUBCUTANEOUS | 1 refills | Status: DC

## 2024-07-10 NOTE — Assessment & Plan Note (Signed)
 She did not take her amlodipine  last night.  Continue amlodipine  nightly.  Will recheck at next appointment.

## 2024-07-10 NOTE — Assessment & Plan Note (Signed)
 Stable on Jardiance .  Volume status stable.  Will add on metoprolol  today after shared decision making for further GDMT.  She will let me know if she has any side effects of dizziness.

## 2024-07-10 NOTE — Assessment & Plan Note (Signed)
 Continue Wegovy  0.5 mg weekly.  Consider increasing as tolerated along with excellent lifestyle changes.

## 2024-07-10 NOTE — Assessment & Plan Note (Signed)
 Resolved.  Was unable to get her endometrial biopsy outpatient.  Will send MyChart message to patient to ask about rescheduling this.

## 2024-07-10 NOTE — Assessment & Plan Note (Signed)
 Continues to decrease her use of cigars and beer.  Continue naltrexone .  Continue excellent support from her daughter.  Follow-up in 3 to 6 months.

## 2024-07-11 ENCOUNTER — Ambulatory Visit (HOSPITAL_COMMUNITY)

## 2024-07-17 ENCOUNTER — Encounter (HOSPITAL_COMMUNITY): Payer: Self-pay

## 2024-07-17 ENCOUNTER — Ambulatory Visit (INDEPENDENT_AMBULATORY_CARE_PROVIDER_SITE_OTHER): Admitting: Psychiatry

## 2024-07-17 ENCOUNTER — Ambulatory Visit (INDEPENDENT_AMBULATORY_CARE_PROVIDER_SITE_OTHER)

## 2024-07-17 VITALS — BP 112/87 | HR 88 | Wt 209.6 lb

## 2024-07-17 DIAGNOSIS — F200 Paranoid schizophrenia: Secondary | ICD-10-CM

## 2024-07-17 DIAGNOSIS — F5105 Insomnia due to other mental disorder: Secondary | ICD-10-CM

## 2024-07-17 DIAGNOSIS — F411 Generalized anxiety disorder: Secondary | ICD-10-CM

## 2024-07-17 DIAGNOSIS — F17219 Nicotine dependence, cigarettes, with unspecified nicotine-induced disorders: Secondary | ICD-10-CM

## 2024-07-17 DIAGNOSIS — F209 Schizophrenia, unspecified: Secondary | ICD-10-CM | POA: Diagnosis not present

## 2024-07-17 MED ORDER — BENZTROPINE MESYLATE 1 MG PO TABS: 1.0000 mg | ORAL_TABLET | Freq: Two times a day (BID) | ORAL | 3 refills | Status: DC

## 2024-07-17 MED ORDER — HALOPERIDOL DECANOATE 100 MG/ML IM SOLN: 100.0000 mg | INTRAMUSCULAR | 3 refills | Status: DC

## 2024-07-17 MED ORDER — RAMELTEON 8 MG PO TABS: 8.0000 mg | ORAL_TABLET | Freq: Every day | ORAL | 3 refills | Status: DC

## 2024-07-17 MED ORDER — HYDROXYZINE PAMOATE 25 MG PO CAPS: 25.0000 mg | ORAL_CAPSULE | Freq: Three times a day (TID) | ORAL | 3 refills | Status: DC

## 2024-07-17 MED ORDER — DOXEPIN HCL 150 MG PO CAPS: 150.0000 mg | ORAL_CAPSULE | Freq: Every day | ORAL | 3 refills | Status: AC

## 2024-07-17 MED ORDER — NICOTINE 21 MG/24HR TD PT24: 21.0000 mg | MEDICATED_PATCH | Freq: Every morning | TRANSDERMAL | 3 refills | Status: AC

## 2024-07-17 MED ORDER — DIVALPROEX SODIUM ER 500 MG PO TB24: 1000.0000 mg | ORAL_TABLET | Freq: Every day | ORAL | 3 refills | Status: DC

## 2024-07-17 NOTE — Progress Notes (Signed)
 07/17/2024 12:00 PM Anita Hill  MRN:  [id number]  Chief Complaint: I have not been sleeping well   HPI: 53 year old female seen today for follow-up psychiatric evaluation.  She has a psychiatric history of schizophrenia, insomnia, anxiety, nicotine  dependence, bipolar 1 disorder, and alcohol induced mood disorder.  Currently she is managed on Depakote  1000 mg nightly, doxepin  150 mg nightly, hydroxyzine  25 mg 3 times daily as needed, and Haldol  100 mg injection every 28 days.  Patient was recently started on naltrexone  50 mg PCP.  She reports her medications are somewhat effective in managing her psychiatric condition.  Today she was well-groomed, pleasant, cooperative, engaged in conversation.  Patient informed Clinical research associate that she has not been sleeping well.  She inform her that she sleeps approximately 4 hours nightly.  During the day she reports that she has been more forgetful. At times she note that she paces trying to remember.  Provider informed patient that sleep deprivation can interfere with memory.  Provider also asked patient if she was using illegal substances or alcohol.  She informed Clinical research associate that she only drinks on the weekends.  She reports drinking approximately 2 beers.  Provider informed patient that alcohol can also interfere with memory.  She endorsed understanding however notes that she strictly drinks on the weekend as she is and is motivated to graduating on time.   Since her last visit she feels that her anxiety and depression has been well-managed.  Provider conducted GAD-7 patient scored a 2.  Provider also conducted PHQ-9 and she scored a 3.  Patient notes that her appetite has decreased since being on Wegovy .  She informed Clinical research associate that she has lost 4 pounds.  Today she denies SI/HI/VAH, mania, or paranoia.    Patient informed that her toe pain has subsided and reports that this condition was addressed by her PCP.  Anita Hill hypertension is currently well-managed.   Recently she was started on metoprolol  to help manage heart failure.  Today she describe abnormal muscle movements in her hand.  An AIMS assessment was conducted patient's scored a 0.  She would like to restart Cogentin .    Today Cogentin  1 mg twice daily restarted.  Patient also agreeable to starting Rozerem  8 mg nightly to help manage sleep. Potential side effects of medication and risks vs benefits of treatment vs non-treatment were explained and discussed. All questions were answered.She will continue all other medications as prescribed.  No other concerns at this time.    Visit Diagnosis:    ICD-10-CM   1. Schizophrenia, unspecified type (HCC)  F20.9 divalproex  (DEPAKOTE  ER) 500 MG 24 hr tablet    haloperidol  decanoate (HALDOL  DECANOATE) 100 MG/ML injection    doxepin  (SINEQUAN ) 150 MG capsule    benztropine  (COGENTIN ) 1 MG tablet    2. Cigarette nicotine  dependence with nicotine -induced disorder  F17.219 nicotine  (NICODERM CQ  - DOSED IN MG/24 HOURS) 21 mg/24hr patch    3. Insomnia due to mental condition  F51.05 doxepin  (SINEQUAN ) 150 MG capsule    ramelteon  (ROZEREM ) 8 MG tablet    4. Anxiety state  F41.1 hydrOXYzine  (VISTARIL ) 25 MG capsule         Past Psychiatric History: schizophrenia, insomnia, anxiety, nicotine  dependence, bipolar 1 disorder, and alcohol induced mood disorder.  Past Medical History:  Past Medical History:  Diagnosis Date   BACK PAIN, LOW 12/29/2006   Qualifier: Diagnosis of   By: Sharron Heron HEATER DISORDER  Bloody diarrhea 07/12/2023   Chest pain 01/20/2016   Delusions (HCC)    Depression    Dyspnea 10/13/2012   Healthcare maintenance 11/11/2021   Heart murmur    MILD   Homelessness    Left ventricular hypertrophy    Palpitations    Psychosis (HCC)    Rash 12/06/2023   Sickle cell trait (HCC)    Substance abuse (HCC)    Synovitis of right knee 02/01/2023   Weight gain 11/11/2021    Past Surgical History:  Procedure  Laterality Date   BUNIONECTOMY     SKIN BIOPSY      Family Psychiatric History: none reported   Family History:  Family History  Problem Relation Age of Onset   Heart disease Mother        CHF   Prostate cancer Father    Colon polyps Neg Hx    Colon cancer Neg Hx    Crohn's disease Neg Hx    Esophageal cancer Neg Hx    Rectal cancer Neg Hx    Stomach cancer Neg Hx    Ulcerative colitis Neg Hx     Social History:  Social History   Socioeconomic History   Marital status: Single    Spouse name: Not on file   Number of children: 2   Years of education: Not on file   Highest education level: Not on file  Occupational History    Comment: Unemployed  Tobacco Use   Smoking status: Every Day    Current packs/day: 1.00    Average packs/day: 1 pack/day for 36.7 years (36.7 ttl pk-yrs)    Types: Cigarettes, Cigars    Start date: 11/02/1987    Passive exposure: Never   Smokeless tobacco: Never   Tobacco comments:    Still smokes one pack of cigars per day.  States still trying to cut back.   Vaping Use   Vaping status: Never Used  Substance and Sexual Activity   Alcohol use: Yes    Comment: everyday 20-80oz   Drug use: Not Currently    Types: Cocaine    Comment: Previous cocaine, heroin, marijuana,CLEAN 21 YEARS UPDATED 09/06/22   Sexual activity: Not Currently  Other Topics Concern   Not on file  Social History Narrative   Not on file   Social Drivers of Health   Financial Resource Strain: Low Risk  (07/15/2021)   Overall Financial Resource Strain (CARDIA)    Difficulty of Paying Living Expenses: Not hard at all  Food Insecurity: No Food Insecurity (07/15/2021)   Hunger Vital Sign    Worried About Running Out of Food in the Last Year: Never true    Ran Out of Food in the Last Year: Never true  Transportation Needs: No Transportation Needs (07/15/2021)   PRAPARE - Administrator, Civil Service (Medical): No    Lack of Transportation (Non-Medical): No   Physical Activity: Not on file  Stress: Not on file  Social Connections: Not on file    Allergies:  Allergies  Allergen Reactions   Strawberry Extract Anaphylaxis   Risperidone And Paliperidone Hives, Nausea Only and Other (See Comments)    Made me feel jittery and restless/ Pt denies this allergy on 01/27/21   Latuda [Lurasidone Hcl] Other (See Comments)    Hot and cold flashes    Metabolic Disorder Labs: Lab Results  Component Value Date   HGBA1C 5.9 03/19/2024   MPG 125.5 05/08/2021   Lab Results  Component Value Date  PROLACTIN 11.8 07/13/2023   PROLACTIN 2.6 (L) 05/08/2021   Lab Results  Component Value Date   CHOL 221 (H) 07/13/2023   TRIG 162 (H) 07/13/2023   HDL 68 07/13/2023   CHOLHDL 3.3 07/13/2023   VLDL 13 05/08/2021   LDLCALC 125 (H) 07/13/2023   LDLCALC 104 (H) 03/22/2022   Lab Results  Component Value Date   TSH 3.740 02/07/2024   TSH 4.210 07/13/2023    Therapeutic Level Labs: No results found for: LITHIUM Lab Results  Component Value Date   VALPROATE 83 09/20/2023   VALPROATE <4 (L) 07/13/2023   Lab Results  Component Value Date   CBMZ <2.0 (L) 01/08/2008    Current Medications: Current Outpatient Medications  Medication Sig Dispense Refill   benztropine  (COGENTIN ) 1 MG tablet Take 1 tablet (1 mg total) by mouth 2 (two) times daily. 60 tablet 3   ramelteon  (ROZEREM ) 8 MG tablet Take 1 tablet (8 mg total) by mouth at bedtime. 30 tablet 3   amLODipine  (NORVASC ) 5 MG tablet TAKE 1 TABLET(5 MG) BY MOUTH AT BEDTIME 90 tablet 3   ciprofloxacin  (CILOXAN ) 0.3 % ophthalmic solution Administer 2 drops twice daily to affected toenail. 5 mL 0   divalproex  (DEPAKOTE  ER) 500 MG 24 hr tablet Take 2 tablets (1,000 mg total) by mouth at bedtime. TAKE 2 TABLETS BY MOUTH EVERY DAY at 10 p.m. 60 tablet 3   doxepin  (SINEQUAN ) 150 MG capsule Take 1 capsule (150 mg total) by mouth at bedtime. 30 capsule 3   empagliflozin  (JARDIANCE ) 10 MG TABS tablet Take  1 tablet (10 mg total) by mouth daily. 30 tablet 0   haloperidol  decanoate (HALDOL  DECANOATE) 100 MG/ML injection Inject 1 mL (100 mg total) into the muscle every 30 (thirty) days. 1 mL 3   hydrOXYzine  (VISTARIL ) 25 MG capsule Take 1 capsule (25 mg total) by mouth 3 (three) times daily. 90 capsule 3   metFORMIN  (GLUCOPHAGE ) 500 MG tablet Take 1 tablet (500 mg total) by mouth daily with breakfast. 30 tablet 2   metoprolol  tartrate (LOPRESSOR ) 25 MG tablet Take 1 tablet (25 mg total) by mouth 2 (two) times daily. 60 tablet 0   naltrexone  (DEPADE) 50 MG tablet Take 1 tablet (50 mg total) by mouth daily. 30 tablet 0   nicotine  (NICODERM CQ  - DOSED IN MG/24 HOURS) 21 mg/24hr patch Place 1 patch (21 mg total) onto the skin every morning. 30 patch 3   nicotine  polacrilex (NICORETTE ) 4 MG gum Take 1 each (4 mg total) by mouth as needed for smoking cessation. 100 tablet 0   semaglutide -weight management (WEGOVY ) 0.5 MG/0.5ML SOAJ SQ injection Inject 0.5 mg into the skin once a week. 2 mL 1   No current facility-administered medications for this visit.     Musculoskeletal: Strength & Muscle Tone: within normal limits Gait & Station: normal Patient leans: N/A  Psychiatric Specialty Exam: Review of Systems  There were no vitals taken for this visit.There is no height or weight on file to calculate BMI.  General Appearance: Well Groomed  Eye Contact:  Good  Speech:  Clear and Coherent and Normal Rate  Volume:  Normal  Mood:  Euthymic  Affect:  Appropriate and Congruent  Thought Process:  Coherent, Goal Directed, and Linear  Orientation:  Full (Time, Place, and Person)  Thought Content: WDL and Logical   Suicidal Thoughts:  No  Homicidal Thoughts:  No  Memory:  Immediate;   Good Recent;   Good Remote;  Good  Judgement:  Good  Insight:  Good  Psychomotor Activity:  Normal  Concentration:  Concentration: Good and Attention Span: Good  Recall:  Good  Fund of Knowledge: Good  Language: Good   Akathisia:  No  Handed:  Right  AIMS (if indicated): done, 0  Assets:  Communication Skills Desire for Improvement Financial Resources/Insurance Housing Leisure Time Physical Health Social Support  ADL's:  Intact  Cognition: WNL  Sleep:  Fair   Screenings: AIMS    Flowsheet Row Office Visit from 02/08/2023 in [facility] Office Visit from 09/02/2022 in [facility] Office Visit from 04/08/2022 in [facility] Office Visit from 02/09/2022 in [facility]  AIMS Total Score 5 3 0 0 0   AUDIT    Flowsheet Row Office Visit from 05/12/2023 in [facility] Admission (Discharged) from [facility]  Alcohol Use Disorder Identification Test Final Score (AUDIT) 10 5   GAD-7    Flowsheet Row Office Visit from 05/12/2023 in [facility] Office Visit from 02/08/2023 in [facility] Office Visit from 12/02/2022 in [facility] Office Visit from 09/02/2022 in [facility] Office Visit from 04/08/2022 in [facility]  Total GAD-7 Score 6 4 9 3  0   PHQ2-9    Flowsheet Row Office Visit from 06/12/2024 in [facility] Of [place]. [facility] Office Visit from 03/19/2024 in [facility] - [facility] Of [place]. [facility] Office Visit from 02/07/2024 in [facility] - [facility] Of [place]. [facility] Office Visit from 09/20/2023 in [facility] - [facility] Dept Of [place]. [facility] Office Visit from 07/12/2023 in [facility] - [facility] Dept Of [place]. [facility]  PHQ-2 Total Score 1 1 1  0 1  PHQ-9  Total Score 6 7 3 7 10    Flowsheet Row Office Visit from 04/08/2022 in [facility] Counselor from 11/17/2021 in [facility] ED from 07/01/2021 in [facility] Department at [facility]  C-SSRS RISK CATEGORY No Risk No Risk No Risk     Assessment and Plan: Patient endorses poor sleep, poor memory, and abnormal muscle movements.Today Cogentin  1 mg twice daily restarted.  Patient also agreeable to starting Rozerem  8 mg nightly to help manage sleep.  1. Schizophrenia, unspecified type (HCC)  Continue- divalproex  (DEPAKOTE  ER) 500 MG 24 hr tablet; Take 2 tablets (1,000 mg total) by mouth at bedtime. TAKE 2 TABLETS BY MOUTH EVERY DAY at 10 p.m.  Dispense: 60 tablet; Refill: 3 Continue- haloperidol  decanoate (HALDOL  DECANOATE) 100 MG/ML injection; Inject 1 mL (100 mg total) into the muscle every 30 (thirty) days.  Dispense: 1 mL; Refill: 3 Continue- doxepin  (SINEQUAN ) 150 MG capsule; Take 1 capsule (150 mg total) by mouth at bedtime.  Dispense: 30 capsule; Refill: 3 Restart- benztropine  (COGENTIN ) 1 MG tablet; Take 1 tablet (1 mg total) by mouth 2 (two) times daily.  Dispense: 60 tablet; Refill: 3  2. Cigarette nicotine  dependence with nicotine -induced disorder  Continue- nicotine  (NICODERM CQ  - DOSED IN MG/24 HOURS) 21 mg/24hr patch; Place 1 patch (21 mg total)  onto the skin every morning.  Dispense: 30 patch; Refill: 3  3. Insomnia due to mental condition  Continue- doxepin  (SINEQUAN ) 150 MG capsule; Take 1 capsule (150 mg total) by mouth at bedtime.  Dispense: 30 capsule; Refill: 3 Start- ramelteon  (ROZEREM ) 8 MG tablet; Take 1 tablet (8 mg total) by mouth at bedtime.  Dispense: 30 tablet; Refill: 3  4. Anxiety state  Continue- hydrOXYzine  (VISTARIL ) 25 MG capsule; Take 1 capsule (25 mg total) by mouth 3 (three) times daily.  Dispense: 90 capsule; Refill: 3   Collaboration of Care: Collaboration of Care: Other provider  involved in patient's care AEB PCP and shot clinic staff  Patient/Guardian was advised Release of Information must be obtained prior to any record release in order to collaborate their care with an outside provider. Patient/Guardian was advised if they have not already done so to contact the registration department to sign all necessary forms in order for us  to release information regarding their care.   Consent: Patient/Guardian gives verbal consent for treatment and assignment of benefits for services provided during this visit. Patient/Guardian expressed understanding and agreed to proceed.   Follow up in one month with shot clinic Follow-up in 3 months   Zane FORBES Bach, NP 07/17/2024, 12:00 PM

## 2024-07-17 NOTE — Progress Notes (Signed)
 Pt presents today to be well groomed and very sweet. Abnormal BP was mentioned to Provider of the day. Pt states that she has an increase on Bp medication per PCP.      Pt tolerated injection of haldol  given in right-upper quadrant. 1 ml 100 mg. No complaints from pt.

## 2024-07-23 ENCOUNTER — Ambulatory Visit (INDEPENDENT_AMBULATORY_CARE_PROVIDER_SITE_OTHER): Admitting: Clinical

## 2024-07-23 DIAGNOSIS — F411 Generalized anxiety disorder: Secondary | ICD-10-CM

## 2024-07-23 NOTE — Progress Notes (Signed)
   THERAPIST PROGRESS NOTE  Session Time: 30 min  Participation Level: Active  Behavioral Response: CasualAlertAnxious  Type of Therapy: Individual Therapy  Treatment Goals addressed: client will engage in at least 80% of scheduled individual psychotherapy sessions  ProgressTowards Goals: Progressing  Interventions: CBT and Supportive  Summary:  Anita Hill is a 53 y.o. female who presents for the scheduled appointment oriented times five, appropriately dressed and friendly. Client denied hallucinations and delusions. Client reported she is feeling indifferent about some things. Client reported she has been seeing a older man. Client reported he wants their relationship to become sexual but she is not comfortable with that. Client reported their is a underlying understanding that she is keeping him near because he can take her out the house when she needs to. Client reported since she turned down his advances he wants her to pay him money for transporting her. Client reported she and her daughter are still at odds. Client reported she just does her best to keep peace in the home. Evidence of progress towards goal:  client reported 1 issues of feeling depressive symptoms which have kept her from engaging in her usual activities such as reading her bible or other activities.  Suicidal/Homicidal: Nowithout intent/plan  Therapist Response:  Therapist began the appointment asking how she has been doing. Therapist engaged with active listening and positive emotional support. Therapist used cbt to engage and give her time to discuss her thoughts and feelings. Therapist used cbt to engage teach her about engaging in her grounding activities. Therapist used CBT ask the client to identify her progress with frequency of use with coping skills with continued practice in her daily activity.    Therapist assigned the client homework to practice self care.   Plan: Return again in 4  weeks.  Diagnosis: GAD  Collaboration of Care: Patient refused AEB none requested.  Patient/Guardian was advised Release of Information must be obtained prior to any record release in order to collaborate their care with an outside provider. Patient/Guardian was advised if they have not already done so to contact the registration department to sign all necessary forms in order for us  to release information regarding their care.   Consent: Patient/Guardian gives verbal consent for treatment and assignment of benefits for services provided during this visit. Patient/Guardian expressed understanding and agreed to proceed.   Estell Dillinger Y Naydeen Speirs, LCSW 07/23/2024

## 2024-08-06 ENCOUNTER — Other Ambulatory Visit: Payer: Self-pay

## 2024-08-06 MED ORDER — SEMAGLUTIDE-WEIGHT MANAGEMENT 1 MG/0.5ML ~~LOC~~ SOAJ: 1.0000 mg | SUBCUTANEOUS | 0 refills | Status: DC

## 2024-08-09 ENCOUNTER — Telehealth: Payer: Self-pay

## 2024-08-09 ENCOUNTER — Other Ambulatory Visit (HOSPITAL_COMMUNITY): Payer: Self-pay

## 2024-08-09 ENCOUNTER — Telehealth (HOSPITAL_COMMUNITY): Payer: Self-pay | Admitting: Psychiatry

## 2024-08-09 NOTE — Telephone Encounter (Signed)
 Patient needs a script sent to pharmacy for amlodipine  5mg . ASAP Thanks.

## 2024-08-09 NOTE — Telephone Encounter (Signed)
 Pharmacy Patient Advocate Encounter   Received notification from CoverMyMeds that prior authorization for WEGOVY  1MG  is required/requested.   Insurance verification completed.   The patient is insured through [facility] MEDICAID.   Effective October 1st, Medicaid will discontinue coverage of GLP1 medications for weight loss (such as Wegovy  and Zepbound), unless the patient has a documented history of a heart attack or stroke. Zepbound will continue to be covered only for patients with moderate to severe sleep apnea (AHI 15-30) and a BMI greater than 40. Because of this change, the prior authorization team will not be submitting new PA requests for GLP1 medications prescribed for weight loss, as patients will be unable to continue therapy under Medicaid coverage.

## 2024-08-15 ENCOUNTER — Ambulatory Visit (INDEPENDENT_AMBULATORY_CARE_PROVIDER_SITE_OTHER)

## 2024-08-15 ENCOUNTER — Encounter (HOSPITAL_COMMUNITY): Payer: Self-pay

## 2024-08-15 ENCOUNTER — Ambulatory Visit (HOSPITAL_COMMUNITY): Admitting: Clinical

## 2024-08-15 VITALS — BP 123/85 | HR 117 | Temp 99.0°F | Ht 60.0 in | Wt 206.4 lb

## 2024-08-15 DIAGNOSIS — F29 Unspecified psychosis not due to a substance or known physiological condition: Secondary | ICD-10-CM

## 2024-08-15 DIAGNOSIS — G4700 Insomnia, unspecified: Secondary | ICD-10-CM

## 2024-08-15 DIAGNOSIS — F319 Bipolar disorder, unspecified: Secondary | ICD-10-CM

## 2024-08-15 DIAGNOSIS — F330 Major depressive disorder, recurrent, mild: Secondary | ICD-10-CM

## 2024-08-15 DIAGNOSIS — F5105 Insomnia due to other mental disorder: Secondary | ICD-10-CM

## 2024-08-15 DIAGNOSIS — F411 Generalized anxiety disorder: Secondary | ICD-10-CM

## 2024-08-15 DIAGNOSIS — F200 Paranoid schizophrenia: Secondary | ICD-10-CM

## 2024-08-15 DIAGNOSIS — F209 Schizophrenia, unspecified: Secondary | ICD-10-CM

## 2024-08-15 MED ORDER — HALOPERIDOL DECANOATE 100 MG/ML IM SOLN
100.0000 mg | INTRAMUSCULAR | Status: AC
  Administered 2024-08-15 – 2024-11-07 (×3): 100 mg via INTRAMUSCULAR

## 2024-08-15 NOTE — Progress Notes (Cosign Needed)
 Patient came into the office today to get her haloperidol  decanoate 100mg / 1ml injection. Patient states that the medication is working for her.  She has no thoughts of hurting herself or other. She was talkative and well dressed.      Pt was given the injection in the left upper quadrant. Injection was prepared and given by harlene fridge, cma.   Pt tolerated injection well.   Pt to return in 28 days.        NDC [id number]   Lot # [id number] exp 08-31-2024

## 2024-08-15 NOTE — Patient Instructions (Signed)
 Patient came into the office today to get her haloperidol  decanoate 100mg / 1ml injection. Patient states that the medication is working for her.  She has no thoughts of hurting herself or other. She was talkative and well dressed.      Pt was given the injection in the left upper quadrant. Injection was prepared and given by harlene fridge, cma.   Pt tolerated injection well.   Pt to return in 28 days.        NDC [id number]   Lot # [id number] exp 08-31-2024

## 2024-08-22 ENCOUNTER — Encounter (HOSPITAL_COMMUNITY): Payer: Self-pay

## 2024-08-29 ENCOUNTER — Ambulatory Visit (HOSPITAL_COMMUNITY): Admitting: Clinical

## 2024-09-12 ENCOUNTER — Ambulatory Visit (HOSPITAL_COMMUNITY)

## 2024-09-21 ENCOUNTER — Other Ambulatory Visit: Payer: Self-pay | Admitting: Family Medicine

## 2024-09-26 ENCOUNTER — Ambulatory Visit (INDEPENDENT_AMBULATORY_CARE_PROVIDER_SITE_OTHER): Admitting: *Deleted

## 2024-09-26 ENCOUNTER — Encounter (HOSPITAL_COMMUNITY): Payer: Self-pay

## 2024-09-26 VITALS — BP 122/86 | HR 87 | Resp 16 | Ht 60.0 in | Wt 205.8 lb

## 2024-09-26 DIAGNOSIS — F330 Major depressive disorder, recurrent, mild: Secondary | ICD-10-CM

## 2024-09-26 DIAGNOSIS — F200 Paranoid schizophrenia: Secondary | ICD-10-CM | POA: Diagnosis not present

## 2024-09-26 DIAGNOSIS — F411 Generalized anxiety disorder: Secondary | ICD-10-CM

## 2024-09-26 DIAGNOSIS — G4700 Insomnia, unspecified: Secondary | ICD-10-CM

## 2024-09-26 DIAGNOSIS — F5105 Insomnia due to other mental disorder: Secondary | ICD-10-CM

## 2024-09-26 DIAGNOSIS — F209 Schizophrenia, unspecified: Secondary | ICD-10-CM

## 2024-09-26 DIAGNOSIS — F29 Unspecified psychosis not due to a substance or known physiological condition: Secondary | ICD-10-CM

## 2024-09-26 NOTE — Progress Notes (Cosign Needed)
 Patient arrived for her injection of Haldol  100mg . States that she forgot to call it in to her pharmacy before she came and did not have it with her. Borrowed one from pharmacy downstairs. Will speak with Staci Kerns, NP to refill medications per patient request. Patient is wanting her Haldol  delivered so that she does not have to pick it up each month. Staci called her guardian Hyman) and she would like to have a meeting with her psychiatrist about increasing the dosage before switching pharmacy.Daughter is agreeable to switching pharmacy for delivery. [facility] [address] [facility] able to deliver when patient is able to switch her pharmacy. Note will need to be added to deliver if escribed per pharmacy. Injection prepared as ordered and given in RUQ without issue or complaint. Dressed very nice and looking forward to her holiday feast. No SI/HI or AVH. Pleasant and cooperative.

## 2024-10-10 ENCOUNTER — Ambulatory Visit (HOSPITAL_COMMUNITY)

## 2024-10-19 ENCOUNTER — Ambulatory Visit (INDEPENDENT_AMBULATORY_CARE_PROVIDER_SITE_OTHER): Payer: Self-pay | Admitting: Family Medicine

## 2024-10-19 VITALS — BP 112/76 | HR 102 | Ht 60.0 in | Wt 218.2 lb

## 2024-10-19 DIAGNOSIS — F1020 Alcohol dependence, uncomplicated: Secondary | ICD-10-CM

## 2024-10-19 DIAGNOSIS — I10 Essential (primary) hypertension: Secondary | ICD-10-CM | POA: Diagnosis not present

## 2024-10-19 DIAGNOSIS — Z9189 Other specified personal risk factors, not elsewhere classified: Secondary | ICD-10-CM | POA: Diagnosis present

## 2024-10-19 NOTE — Progress Notes (Unsigned)
" ° °  SUBJECTIVE:   CHIEF COMPLAINT / HPI:  Discussed the use of AI scribe software for clinical note transcription with the patient, who gave verbal consent to proceed.  History of Present Illness Anita Hill is a 53 year old female with schizophrenia, bipolar disorder, and heart failure who presents with episodes of vision loss and slumping over. She is accompanied by her daughter.  Episodes of vision loss and syncope - Sudden onset of complete vision loss described as total darkness lasting approximately four hours - Episode occurred last Saturday at a friend's house after alcohol consumption and not eating all day - Associated with slumping over and feeling unsteady - Symptoms improved after eating  Medication adherence and side effects - Daughter expresses concern about inconsistent medication use, including skipping doses due to side effects - Unclear if naltrexone  for alcohol use is currently being taken - Patient denies skipping medications and states she takes all prescribed medications  Alcohol use disorder - Ongoing alcohol use - Reported intake of two beers may underestimate actual consumption - Alcohol use may have increased since loss of transportation services during school break  Psychiatric disorders - History of schizophrenia and bipolar disorder  Heart failure - History of heart failure - Concern about impact of weight on cardiac condition  Obesity and weight management - Current weight 380 lb, down from 401 lb - Off Ozempic  for one month due to insurance issues - Actively working on weight loss - Expresses concern about weight affecting overall health    PERTINENT  PMH / PSH: ***  OBJECTIVE:  BP (!) 122/97   Pulse (!) 102   Ht 5' (1.524 m)   Wt 218 lb 3.2 oz (99 kg)   SpO2 100%   BMI 42.61 kg/m   Physical Exam GENERAL: Alert, cooperative, well developed, no acute distress HEENT: Normocephalic, normal oropharynx, moist mucous membranes CHEST:  Clear to auscultation bilaterally, No wheezes, rhonchi, or crackles CARDIOVASCULAR: Normal heart rate and rhythm, S1 and S2 normal without murmurs ABDOMEN: Soft, non-tender, non-distended, without organomegaly, Normal bowel sounds EXTREMITIES: No cyanosis or edema NEUROLOGICAL: Cranial nerves grossly intact, Moves all extremities without gross motor or sensory deficit  ASSESSMENT/PLAN:   Assessment & Plan At risk for sleep apnea  Assessment and Plan Assessment & Plan Heart failure with mildly reduced ejection fraction Heart rate slightly elevated, possibly due to anxiety. Recent episode of sudden vision loss and unresponsiveness.  - Continue current heart failure management.  Alcohol use disorder, moderate, dependence Recent episode likely due to hypoglycemia and alcohol consumption. Naltrexone  use discussed, but she prefers to manage without medication due to concerns about interactions with alcohol. - Encouraged personal steps to reduce alcohol consumption. - Advised emergency room visit if similar episodes recur.  Obesity with BMI 40.0-44.9, adult Weight decreased from 401 lbs to 380 lbs. Insurance issues paused We[employer] . Discussed potential switch to tirzepatide if sleep apnea is confirmed. Concerns about weight loss medication risks due to heart condition. - Ordered sleep study to evaluate for sleep apnea. - Will consider switch to tirzepatide if sleep apnea is confirmed. - Encouraged lifestyle modifications for weight management.  Primary hypertension Blood pressure management ongoing. Recent acute episode reported. - Continue current antihypertensive regimen.  Sleep apnea (suspected) Suspected due to snoring and daytime fatigue. Potential for tirzepatide coverage if confirmed. - Ordered sleep study to confirm sleep apnea diagnosis.     Stuart Redo, MD [facility]  "

## 2024-10-19 NOTE — Patient Instructions (Addendum)
 STOP naltrexone .  Be sure to go to ED if symptoms return.  I have ordered a sleep study. We may can get you medications in the future to help this.  Come back in 1 month to follow up on the above. Be sure to bring all of your medication!

## 2024-10-20 NOTE — Assessment & Plan Note (Signed)
 Recent episode of vision changes/slumping likely due to relative hypoglycemia with alcohol consumption. Lower suspicion for stroke at this time due to resolution with eating a meal. Naltrexone  use discussed, but she prefers to manage without medication at this time. - Encouraged personal steps to reduce alcohol consumption; STOP naltrexone . - She will eat three meals a day, especially if drinking alcohol. - Return in 1 month to check in. - Advised emergency room visit if similar episodes recur.

## 2024-10-20 NOTE — Assessment & Plan Note (Signed)
 Controlled. Continue current management. She will bring in all medications to next visit.

## 2024-11-06 ENCOUNTER — Other Ambulatory Visit (HOSPITAL_COMMUNITY): Payer: Self-pay | Admitting: Psychiatry

## 2024-11-06 DIAGNOSIS — F209 Schizophrenia, unspecified: Secondary | ICD-10-CM

## 2024-11-07 ENCOUNTER — Ambulatory Visit (HOSPITAL_COMMUNITY): Admitting: Family

## 2024-11-07 ENCOUNTER — Encounter (HOSPITAL_COMMUNITY): Payer: Self-pay

## 2024-11-07 ENCOUNTER — Ambulatory Visit (INDEPENDENT_AMBULATORY_CARE_PROVIDER_SITE_OTHER)

## 2024-11-07 VITALS — BP 113/81 | HR 98 | Temp 98.0°F | Ht 60.0 in | Wt 207.6 lb

## 2024-11-07 DIAGNOSIS — F200 Paranoid schizophrenia: Secondary | ICD-10-CM

## 2024-11-07 DIAGNOSIS — Z79899 Other long term (current) drug therapy: Secondary | ICD-10-CM | POA: Diagnosis not present

## 2024-11-07 DIAGNOSIS — F5105 Insomnia due to other mental disorder: Secondary | ICD-10-CM | POA: Diagnosis not present

## 2024-11-07 DIAGNOSIS — F411 Generalized anxiety disorder: Secondary | ICD-10-CM

## 2024-11-07 DIAGNOSIS — F209 Schizophrenia, unspecified: Secondary | ICD-10-CM | POA: Diagnosis not present

## 2024-11-07 MED ORDER — HYDROXYZINE PAMOATE 25 MG PO CAPS: 25.0000 mg | ORAL_CAPSULE | Freq: Three times a day (TID) | ORAL | 0 refills | Status: AC

## 2024-11-07 MED ORDER — RAMELTEON 8 MG PO TABS: 8.0000 mg | ORAL_TABLET | Freq: Every day | ORAL | 1 refills | Status: AC

## 2024-11-07 MED ORDER — HALOPERIDOL DECANOATE 100 MG/ML IM SOLN: 100.0000 mg | INTRAMUSCULAR | 3 refills | Status: AC

## 2024-11-07 MED ORDER — BENZTROPINE MESYLATE 1 MG PO TABS: 1.0000 mg | ORAL_TABLET | Freq: Two times a day (BID) | ORAL | 1 refills | Status: AC

## 2024-11-07 NOTE — Progress Notes (Cosign Needed)
 Patient presented to office for injection and provider visit. Patient was seen by provider prior to injection. Patient received injection LUQ. Patient tolerated injection well. No complaints noted. Patient will return in 28 days. Patient left clinic alert and ambulatory.

## 2024-11-07 NOTE — Progress Notes (Signed)
 [facility] MD/PA/NP OP Progress Note  11/07/2024 1:55 PM Anita Hill  MRN:  [id number]  Chief Complaint: Long-acting injection clinic  HPI: Anita Hill 54 year old African-American female presents for long-acting injection clinic.  Carries a diagnosis with schizophrenia unspecified, sleep disturbance, bipolar disorder, generalized anxiety disorder and nicotine  dependency.  Anita Hill is currently prescribed Cogentin , Depakote , doxepin , ramelteon  and Haldol  decanoate 100 mg every 28 days.  Anita Hill reported she has been taking and tolerating medications well, however was recently started  a white pill, I think it helps with depression and sleep.  Reported needing a follow-up appointment with primary mental health provider as patient's daughter has questions related to medications. Per previous encounter daughter had concerns related to patient responding to internal stimuli after 4:05 PM.  Inquired about medication adjustments.  Patient encouraged to keep follow-up appointment with outpatient provider.  Anita Hill denied concerns related to restlessness or akathisia.  Aims 0.  Denied suicidal or homicidal ideations.  Denied auditory or visual hallucinations.  Orders placed for valproic level.   Anita Hill presents with a bright and pleasant affect.  States mood is stabilized since been on medication.  Reports feeling a lot better recently celebrated a birthday.  No concerns related to auditory visual hallucinations.  Denied paranoia.  Does not appear to be responding to internal and external stimuli.  Will continue to make medications available until follow-up appointment with nurse practitioner.  Support, encouragement  and reassurance was provided.   Visit Diagnosis:    ICD-10-CM   1. On valproic acid  therapy  Z79.899 Valproic Acid  level    2. Schizophrenia, unspecified type (HCC)  F20.9 benztropine  (COGENTIN ) 1 MG tablet    3. Insomnia due to mental condition  F51.05 ramelteon  (ROZEREM ) 8 MG tablet    4.  Anxiety state  F41.1 hydrOXYzine  (VISTARIL ) 25 MG capsule      Past Psychiatric History:   Past Medical History:  Past Medical History:  Diagnosis Date   BACK PAIN, LOW 12/29/2006   Qualifier: Diagnosis of   By: Sharron Railing         BIPOLAR DISORDER    Bloody diarrhea 07/12/2023   Chest pain 01/20/2016   Delusions (HCC)    Depression    Dyspnea 10/13/2012   Healthcare maintenance 11/11/2021   Heart murmur    MILD   Homelessness    Left ventricular hypertrophy    Palpitations    Psychosis (HCC)    Rash 12/06/2023   Sickle cell trait    Substance abuse (HCC)    Synovitis of right knee 02/01/2023   Weight gain 11/11/2021    Past Surgical History:  Procedure Laterality Date   BUNIONECTOMY     SKIN BIOPSY      Family Psychiatric History:   Family History:  Family History  Problem Relation Age of Onset   Heart disease Mother        CHF   Prostate cancer Father    Colon polyps Neg Hx    Colon cancer Neg Hx    Crohn's disease Neg Hx    Esophageal cancer Neg Hx    Rectal cancer Neg Hx    Stomach cancer Neg Hx    Ulcerative colitis Neg Hx     Social History:  Social History   Socioeconomic History   Marital status: Single    Spouse name: Not on file   Number of children: 2   Years of education: Not on file   Highest education level: Not on file  Occupational  History    Comment: Unemployed  Tobacco Use   Smoking status: Every Day    Current packs/day: 1.00    Average packs/day: 1 pack/day for 37.0 years (37.0 ttl pk-yrs)    Types: Cigarettes, Cigars    Start date: 11/02/1987    Passive exposure: Never   Smokeless tobacco: Never   Tobacco comments:    Still smokes one pack of cigars per day.  States still trying to cut back.   Vaping Use   Vaping status: Never Used  Substance and Sexual Activity   Alcohol use: Yes    Comment: everyday 20-80oz   Drug use: Not Currently    Types: Cocaine    Comment: Previous cocaine, heroin, marijuana,CLEAN 21  YEARS UPDATED 09/06/22   Sexual activity: Not Currently  Other Topics Concern   Not on file  Social History Narrative   Not on file   Social Drivers of Health   Tobacco Use: High Risk (09/26/2024)   Patient History    Smoking Tobacco Use: Every Day    Smokeless Tobacco Use: Never    Passive Exposure: Never  Financial Resource Strain: Not on file  Food Insecurity: Not on file  Transportation Needs: Not on file  Physical Activity: Not on file  Stress: Not on file  Social Connections: Not on file  Depression (PHQ2-9): Low Risk (07/17/2024)   Depression (PHQ2-9)    PHQ-2 Score: 3  Recent Concern: Depression (PHQ2-9) - Medium Risk (06/12/2024)   Depression (PHQ2-9)    PHQ-2 Score: 6  Alcohol Screen: Medium Risk (05/12/2023)   Alcohol Screen    Last Alcohol Screening Score (AUDIT): 10  Housing: Not on file  Utilities: Not on file  Health Literacy: Not on file    Allergies: Allergies[1]  Metabolic Disorder Labs: Lab Results  Component Value Date   HGBA1C 5.9 03/19/2024   MPG 125.5 05/08/2021   Lab Results  Component Value Date   PROLACTIN 11.8 07/13/2023   PROLACTIN 2.6 (L) 05/08/2021   Lab Results  Component Value Date   CHOL 221 (H) 07/13/2023   TRIG 162 (H) 07/13/2023   HDL 68 07/13/2023   CHOLHDL 3.3 07/13/2023   VLDL 13 05/08/2021   LDLCALC 125 (H) 07/13/2023   LDLCALC 104 (H) 03/22/2022   Lab Results  Component Value Date   TSH 3.740 02/07/2024   TSH 4.210 07/13/2023    Therapeutic Level Labs: No results found for: LITHIUM Lab Results  Component Value Date   VALPROATE 83 09/20/2023   VALPROATE <4 (L) 07/13/2023   Lab Results  Component Value Date   CBMZ <2.0 (L) 01/08/2008    Current Medications: Current Outpatient Medications  Medication Sig Dispense Refill   amLODipine  (NORVASC ) 5 MG tablet TAKE 1 TABLET(5 MG) BY MOUTH AT BEDTIME 90 tablet 3   benztropine  (COGENTIN ) 1 MG tablet Take 1 tablet (1 mg total) by mouth 2 (two) times daily. 30  tablet 1   ciprofloxacin  (CILOXAN ) 0.3 % ophthalmic solution Administer 2 drops twice daily to affected toenail. 5 mL 0   divalproex  (DEPAKOTE  ER) 500 MG 24 hr tablet Take 2 tablets (1,000 mg total) by mouth at bedtime. TAKE 2 TABLETS BY MOUTH EVERY DAY at 10 p.m. 60 tablet 3   doxepin  (SINEQUAN ) 150 MG capsule Take 1 capsule (150 mg total) by mouth at bedtime. 30 capsule 3   empagliflozin  (JARDIANCE ) 10 MG TABS tablet Take 1 tablet (10 mg total) by mouth daily. 30 tablet 0   haloperidol  decanoate (HALDOL  DECANOATE)  100 MG/ML injection Inject 1 mL (100 mg total) into the muscle every 30 (thirty) days. 1 mL 3   hydrOXYzine  (VISTARIL ) 25 MG capsule Take 1 capsule (25 mg total) by mouth 3 (three) times daily. 60 capsule 0   metFORMIN  (GLUCOPHAGE ) 500 MG tablet Take 1 tablet (500 mg total) by mouth daily with breakfast. 30 tablet 2   metoprolol  tartrate (LOPRESSOR ) 25 MG tablet TAKE 1 TABLET(25 MG) BY MOUTH TWICE DAILY 60 tablet 0   nicotine  (NICODERM CQ  - DOSED IN MG/24 HOURS) 21 mg/24hr patch Place 1 patch (21 mg total) onto the skin every morning. 30 patch 3   nicotine  polacrilex (NICORETTE ) 4 MG gum Take 1 each (4 mg total) by mouth as needed for smoking cessation. 100 tablet 0   ramelteon  (ROZEREM ) 8 MG tablet Take 1 tablet (8 mg total) by mouth at bedtime. 30 tablet 1   Current Facility-Administered Medications  Medication Dose Route Frequency Provider Last Rate Last Admin   haloperidol  decanoate (HALDOL  DECANOATE) 100 MG/ML injection 100 mg  100 mg Intramuscular Q28 days Harl Regan E, NP   100 mg at 09/26/24 1318     Musculoskeletal: Strength & Muscle Tone: within normal limits Gait & Station: normal Patient leans: N/A  Psychiatric Specialty Exam: Review of Systems  There were no vitals taken for this visit.There is no height or weight on file to calculate BMI.  General Appearance: Casual  Eye Contact:  Good  Speech:  Clear and Coherent  Volume:  Normal  Mood:  Euthymic   Affect:  Appropriate  Thought Process:  Coherent  Orientation:  Full (Time, Place, and Person)  Thought Content: Logical   Suicidal Thoughts:  No  Homicidal Thoughts:  No  Memory:  Immediate;   Fair Recent;   Fair  Judgement:  Good  Insight:  Good  Psychomotor Activity:  Normal  Concentration:  Concentration: Good  Recall:  Good  Fund of Knowledge: Good  Language: Good  Akathisia:  No  Handed:  Right  AIMS (if indicated): done  Assets:  Communication Skills Desire for Improvement  ADL's:  Intact  Cognition: WNL  Sleep:  Fair   Screenings: AIMS    Flowsheet Row Office Visit from 07/17/2024 in [facility] Office Visit from 02/08/2023 in [facility] Office Visit from 09/02/2022 in [facility] Office Visit from 04/08/2022 in [facility] Office Visit from 02/09/2022 in [facility]  AIMS Total Score 0 5 3 0 0   AUDIT    Flowsheet Row Office Visit from 05/12/2023 in [facility]  Alcohol Use Disorder Identification Test Final Score (AUDIT) 10 5   GAD-7    Flowsheet Row Office Visit from 07/17/2024 in [facility] Office Visit from 05/12/2023 in [facility] Office Visit from 02/08/2023 in [facility] Office Visit from 12/02/2022 in [facility] Office Visit from 09/02/2022 in [facility]  Total GAD-7 Score 2 6 4 9 3    PHQ2-9    Flowsheet Row Office Visit from 07/17/2024 in [facility] Office Visit from 06/12/2024 in [facility] [place]. [facility] Office Visit from 03/19/2024 in [facility] Of [place]. [facility] Office Visit from 02/07/2024 in [facility] - [facility] Dept Of [place]. [facility] Office Visit from 09/20/2023 in [facility] - [facility] Dept Of [place]. [facility]  PHQ-2 Total Score 0 1 1 1  0  PHQ-9 Total Score 3 6 7 3 7    Flowsheet Row Office Visit from 04/08/2022 in [facility] from 11/17/2021 in [facility] ED from 07/01/2021 in [facility] Emergency Department at [facility]  C-SSRS RISK CATEGORY No Risk No Risk No Risk     Assessment and Plan:  - Orders placed for CMP, CBC, Prolactin, Lipid Panel and valproic acid  and EKG  1. Schizophrenia, unspecified type (HCC) 2. 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 Noted to have 3 refills- need Valproic Level  Continue- haloperidol  decanoate (HALDOL  DECANOATE) 100 MG/ML injection; Inject 1 mL (100 mg total) into the muscle every 28  days.- medication was sent to  [facility] pharmacy.   Continue- doxepin  (SINEQUAN ) 150 MG capsule. Noted to have 3 refills.  Continue benztropine  (COGENTIN ) 1 MG tablet; Refilled x 30 day and 1 refill    Continue- nicotine  (NICODERM CQ  - DOSED IN MG/24 HOURS) 21 mg/24hr patch noted to have 3 refills.    Continue- hydrOXYzine  (VISTARIL ) 25 MG capsule; Take 1 capsule (25 mg total) by mouth 3 (three) times daily, refilled x 60 days with 1 refill   Collaboration of Care: Collaboration of Care: Medication Management AEB refilled 60 days  Patient/Guardian was advised Release of Information must be obtained prior to any record release in order to collaborate their care with an outside provider. Patient/Guardian was advised if they have not already done so to contact the registration department to sign all necessary forms in order for us  to release information  regarding their care.   Consent: Patient/Guardian gives verbal consent for treatment and assignment of benefits for services provided during this visit. Patient/Guardian expressed understanding and agreed to proceed.    Staci LOISE Kerns, NP 11/07/2024, 1:55 PM     [1]  Allergies Allergen Reactions   Strawberry Extract Anaphylaxis   Risperidone And Paliperidone Hives, Nausea Only and Other (See Comments)    Made me feel jittery and restless/ Pt denies this allergy on 01/27/21   Latuda [Lurasidone Hcl] Other (See Comments)    Hot and cold flashes

## 2024-11-08 ENCOUNTER — Other Ambulatory Visit (HOSPITAL_COMMUNITY): Payer: Self-pay | Admitting: Psychiatry

## 2024-11-08 DIAGNOSIS — F209 Schizophrenia, unspecified: Secondary | ICD-10-CM

## 2024-11-22 ENCOUNTER — Encounter (HOSPITAL_COMMUNITY): Admitting: Psychiatry

## 2024-12-03 ENCOUNTER — Encounter (HOSPITAL_COMMUNITY): Payer: Self-pay

## 2024-12-03 ENCOUNTER — Telehealth (HOSPITAL_COMMUNITY): Admitting: Psychiatry

## 2024-12-05 ENCOUNTER — Encounter (HOSPITAL_COMMUNITY): Payer: Self-pay

## 2024-12-05 ENCOUNTER — Other Ambulatory Visit (INDEPENDENT_AMBULATORY_CARE_PROVIDER_SITE_OTHER)

## 2024-12-05 ENCOUNTER — Ambulatory Visit (HOSPITAL_COMMUNITY)

## 2024-12-05 VITALS — BP 117/88 | HR 87 | Wt 219.4 lb

## 2024-12-05 DIAGNOSIS — Z79899 Other long term (current) drug therapy: Secondary | ICD-10-CM

## 2024-12-05 DIAGNOSIS — F200 Paranoid schizophrenia: Secondary | ICD-10-CM | POA: Diagnosis not present

## 2024-12-05 DIAGNOSIS — F411 Generalized anxiety disorder: Secondary | ICD-10-CM

## 2024-12-05 NOTE — Progress Notes (Signed)
 Patient presented to office for LAI of Haldol  100 mg/ml. Pt tolerated injection well and will return in 28 days for the next injection. Injection was given in the RUOQ.

## 2024-12-05 NOTE — Addendum Note (Signed)
 Addended by: TRUDY MANUEL A on: 12/05/2024 02:47 PM   Modules accepted: Orders

## 2024-12-05 NOTE — Progress Notes (Signed)
 Patient presented to the office for labs, labs were drawn from left [facility] with no issue or complaints . Pt left office alert and ambulatory.

## 2024-12-06 LAB — CBC WITH DIFFERENTIAL/PLATELET
Basophils Absolute: 0 10*3/uL (ref 0.0–0.2)
Basos: 1 %
EOS (ABSOLUTE): 0.1 10*3/uL (ref 0.0–0.4)
Eos: 2 %
Hematocrit: 41.8 % (ref 34.0–46.6)
Hemoglobin: 13.8 g/dL (ref 11.1–15.9)
Immature Grans (Abs): 0 10*3/uL (ref 0.0–0.1)
Immature Granulocytes: 0 %
Lymphocytes Absolute: 2.6 10*3/uL (ref 0.7–3.1)
Lymphs: 40 %
MCH: 28.4 pg (ref 26.6–33.0)
MCHC: 33 g/dL (ref 31.5–35.7)
MCV: 86 fL (ref 79–97)
Monocytes Absolute: 0.6 10*3/uL (ref 0.1–0.9)
Monocytes: 9 %
Neutrophils Absolute: 3.1 10*3/uL (ref 1.4–7.0)
Neutrophils: 47 %
Platelets: 285 10*3/uL (ref 150–450)
RBC: 4.86 x10E6/uL (ref 3.77–5.28)
RDW: 14.5 % (ref 11.7–15.4)
WBC: 6.5 10*3/uL (ref 3.4–10.8)

## 2024-12-06 LAB — COMPREHENSIVE METABOLIC PANEL WITH GFR
ALT: 8 [IU]/L (ref 0–32)
AST: 12 [IU]/L (ref 0–40)
Albumin: 4.2 g/dL (ref 3.8–4.9)
Alkaline Phosphatase: 93 [IU]/L (ref 49–135)
BUN/Creatinine Ratio: 11 (ref 9–23)
BUN: 11 mg/dL (ref 6–24)
Bilirubin Total: 0.2 mg/dL (ref 0.0–1.2)
CO2: 20 mmol/L (ref 20–29)
Calcium: 9.4 mg/dL (ref 8.7–10.2)
Chloride: 104 mmol/L (ref 96–106)
Creatinine, Ser: 0.97 mg/dL (ref 0.57–1.00)
Globulin, Total: 2.5 g/dL (ref 1.5–4.5)
Glucose: 98 mg/dL (ref 70–99)
Potassium: 4.3 mmol/L (ref 3.5–5.2)
Sodium: 141 mmol/L (ref 134–144)
Total Protein: 6.7 g/dL (ref 6.0–8.5)
eGFR: 69 mL/min/{1.73_m2}

## 2024-12-06 LAB — VALPROIC ACID LEVEL: Valproic Acid Lvl: 74 ug/mL (ref 50–100)

## 2024-12-06 LAB — LIPID PANEL
Chol/HDL Ratio: 3.3 ratio (ref 0.0–4.4)
Cholesterol, Total: 194 mg/dL (ref 100–199)
HDL: 59 mg/dL
LDL Chol Calc (NIH): 104 mg/dL — ABNORMAL HIGH (ref 0–99)
Triglycerides: 183 mg/dL — ABNORMAL HIGH (ref 0–149)
VLDL Cholesterol Cal: 31 mg/dL (ref 5–40)

## 2024-12-06 LAB — PROLACTIN: Prolactin: 13 ng/mL (ref 3.6–25.2)

## 2025-01-02 ENCOUNTER — Ambulatory Visit (HOSPITAL_COMMUNITY)

## 2025-01-30 ENCOUNTER — Ambulatory Visit (HOSPITAL_COMMUNITY)
